# Patient Record
Sex: Female | Born: 1950 | Race: White | Hispanic: No | Marital: Married | Smoking: Never smoker
Health system: Southern US, Community
[De-identification: ages and names within clinical notes are randomized; demographics above are authoritative.]

## PROBLEM LIST (undated history)

## (undated) DIAGNOSIS — M8700 Idiopathic aseptic necrosis of unspecified bone: Secondary | ICD-10-CM

## (undated) DIAGNOSIS — Z860100 Personal history of colon polyps, unspecified: Secondary | ICD-10-CM

## (undated) DIAGNOSIS — M87051 Idiopathic aseptic necrosis of right femur: Secondary | ICD-10-CM

## (undated) DIAGNOSIS — R911 Solitary pulmonary nodule: Secondary | ICD-10-CM

## (undated) DIAGNOSIS — M47816 Spondylosis without myelopathy or radiculopathy, lumbar region: Secondary | ICD-10-CM

## (undated) DIAGNOSIS — N393 Stress incontinence (female) (male): Secondary | ICD-10-CM

## (undated) DIAGNOSIS — J320 Chronic maxillary sinusitis: Secondary | ICD-10-CM

## (undated) DIAGNOSIS — R945 Abnormal results of liver function studies: Secondary | ICD-10-CM

## (undated) DIAGNOSIS — L718 Other rosacea: Secondary | ICD-10-CM

## (undated) DIAGNOSIS — T7840XA Allergy, unspecified, initial encounter: Secondary | ICD-10-CM

## (undated) DIAGNOSIS — J302 Other seasonal allergic rhinitis: Secondary | ICD-10-CM

## (undated) DIAGNOSIS — E559 Vitamin D deficiency, unspecified: Secondary | ICD-10-CM

## (undated) DIAGNOSIS — K219 Gastro-esophageal reflux disease without esophagitis: Secondary | ICD-10-CM

## (undated) DIAGNOSIS — J329 Chronic sinusitis, unspecified: Secondary | ICD-10-CM

## (undated) DIAGNOSIS — R569 Unspecified convulsions: Secondary | ICD-10-CM

## (undated) DIAGNOSIS — E119 Type 2 diabetes mellitus without complications: Secondary | ICD-10-CM

## (undated) DIAGNOSIS — M169 Osteoarthritis of hip, unspecified: Secondary | ICD-10-CM

## (undated) DIAGNOSIS — M797 Fibromyalgia: Secondary | ICD-10-CM

## (undated) DIAGNOSIS — L309 Dermatitis, unspecified: Secondary | ICD-10-CM

## (undated) DIAGNOSIS — Z780 Asymptomatic menopausal state: Secondary | ICD-10-CM

## (undated) DIAGNOSIS — I10 Essential (primary) hypertension: Secondary | ICD-10-CM

## (undated) DIAGNOSIS — M19041 Primary osteoarthritis, right hand: Secondary | ICD-10-CM

## (undated) DIAGNOSIS — H269 Unspecified cataract: Secondary | ICD-10-CM

## (undated) DIAGNOSIS — F419 Anxiety disorder, unspecified: Secondary | ICD-10-CM

## (undated) DIAGNOSIS — K760 Fatty (change of) liver, not elsewhere classified: Secondary | ICD-10-CM

## (undated) DIAGNOSIS — J45909 Unspecified asthma, uncomplicated: Secondary | ICD-10-CM

## (undated) DIAGNOSIS — N8110 Cystocele, unspecified: Secondary | ICD-10-CM

## (undated) DIAGNOSIS — G4733 Obstructive sleep apnea (adult) (pediatric): Secondary | ICD-10-CM

## (undated) DIAGNOSIS — R51 Headache: Secondary | ICD-10-CM

## (undated) DIAGNOSIS — K589 Irritable bowel syndrome without diarrhea: Secondary | ICD-10-CM

## (undated) DIAGNOSIS — J309 Allergic rhinitis, unspecified: Secondary | ICD-10-CM

## (undated) DIAGNOSIS — M869 Osteomyelitis, unspecified: Secondary | ICD-10-CM

## (undated) DIAGNOSIS — G4730 Sleep apnea, unspecified: Secondary | ICD-10-CM

## (undated) DIAGNOSIS — J189 Pneumonia, unspecified organism: Secondary | ICD-10-CM

## (undated) DIAGNOSIS — Z8739 Personal history of other diseases of the musculoskeletal system and connective tissue: Secondary | ICD-10-CM

## (undated) DIAGNOSIS — F4310 Post-traumatic stress disorder, unspecified: Secondary | ICD-10-CM

## (undated) DIAGNOSIS — Z8701 Personal history of pneumonia (recurrent): Secondary | ICD-10-CM

## (undated) DIAGNOSIS — E782 Mixed hyperlipidemia: Secondary | ICD-10-CM

## (undated) DIAGNOSIS — Z8669 Personal history of other diseases of the nervous system and sense organs: Secondary | ICD-10-CM

## (undated) DIAGNOSIS — K449 Diaphragmatic hernia without obstruction or gangrene: Secondary | ICD-10-CM

## (undated) DIAGNOSIS — E538 Deficiency of other specified B group vitamins: Secondary | ICD-10-CM

## (undated) DIAGNOSIS — F3342 Major depressive disorder, recurrent, in full remission: Secondary | ICD-10-CM

## (undated) DIAGNOSIS — G2581 Restless legs syndrome: Secondary | ICD-10-CM

## (undated) DIAGNOSIS — G44029 Chronic cluster headache, not intractable: Secondary | ICD-10-CM

## (undated) DIAGNOSIS — M19042 Primary osteoarthritis, left hand: Secondary | ICD-10-CM

## (undated) HISTORY — DX: Unspecified cataract: H26.9

## (undated) HISTORY — PX: RHINOPLASTY: SUR1284

## (undated) HISTORY — DX: Fibromyalgia: M79.7

## (undated) HISTORY — DX: Irritable bowel syndrome without diarrhea: K58.9

## (undated) HISTORY — PX: OTHER SURGICAL HISTORY: SHX169

## (undated) HISTORY — PX: LAPAROSCOPIC TOTAL HYSTERECTOMY: SUR800

## (undated) HISTORY — PX: INCONTINENCE SURGERY: SHX676

## (undated) HISTORY — DX: Personal history of other diseases of the nervous system and sense organs: Z86.69

## (undated) HISTORY — PX: REVISION TOTAL HIP ARTHROPLASTY: SHX766

## (undated) HISTORY — PX: CARPAL TUNNEL RELEASE: SHX101

## (undated) HISTORY — DX: Essential (primary) hypertension: I10

## (undated) HISTORY — DX: Allergic rhinitis, unspecified: J30.9

## (undated) HISTORY — PX: ULNAR NERVE TRANSPOSITION: SHX2595

## (undated) HISTORY — DX: Primary osteoarthritis, left hand: M19.042

## (undated) HISTORY — PX: BREAST BIOPSY: SHX20

## (undated) HISTORY — DX: Gastro-esophageal reflux disease without esophagitis: K21.9

## (undated) HISTORY — DX: Unspecified asthma, uncomplicated: J45.909

## (undated) HISTORY — DX: Abnormal results of liver function studies: R94.5

## (undated) HISTORY — DX: Primary osteoarthritis, right hand: M19.041

## (undated) HISTORY — PX: NASAL SEPTUM SURGERY: SHX37

## (undated) HISTORY — PX: EYE SURGERY: SHX253

## (undated) HISTORY — DX: Fatty (change of) liver, not elsewhere classified: K76.0

## (undated) HISTORY — DX: Sleep apnea, unspecified: G47.30

## (undated) HISTORY — DX: Restless legs syndrome: G25.81

## (undated) HISTORY — DX: Asymptomatic menopausal state: Z78.0

## (undated) HISTORY — DX: Major depressive disorder, recurrent, in full remission: F33.42

## (undated) HISTORY — PX: JOINT REPLACEMENT: SHX530

## (undated) HISTORY — PX: COLONOSCOPY: SHX174

## (undated) HISTORY — DX: Obstructive sleep apnea (adult) (pediatric): G47.33

## (undated) HISTORY — DX: Allergy, unspecified, initial encounter: T78.40XA

---

## 1977-10-08 HISTORY — PX: ECTOPIC PREGNANCY SURGERY: SHX613

## 1982-10-08 HISTORY — PX: TOTAL ABDOMINAL HYSTERECTOMY W/ BILATERAL SALPINGOOPHORECTOMY: SHX83

## 1982-10-08 HISTORY — PX: BLADDER SURGERY: SHX569

## 1985-10-08 HISTORY — PX: NASAL SEPTUM SURGERY: SHX37

## 1987-10-09 HISTORY — PX: CARPAL TUNNEL RELEASE: SHX101

## 2003-10-09 DIAGNOSIS — Z8639 Personal history of other endocrine, nutritional and metabolic disease: Secondary | ICD-10-CM

## 2003-10-09 HISTORY — DX: Personal history of other endocrine, nutritional and metabolic disease: Z86.39

## 2004-10-08 DIAGNOSIS — R569 Unspecified convulsions: Secondary | ICD-10-CM

## 2004-10-08 DIAGNOSIS — Z87898 Personal history of other specified conditions: Secondary | ICD-10-CM

## 2004-10-08 HISTORY — PX: ANAL FISSURE REPAIR: SHX2312

## 2004-10-08 HISTORY — DX: Personal history of other specified conditions: Z87.898

## 2004-10-08 HISTORY — PX: ABDOMINOPLASTY: SUR9

## 2004-10-08 HISTORY — DX: Unspecified convulsions: R56.9

## 2005-10-08 HISTORY — PX: RHINOPLASTY: SUR1284

## 2013-05-11 ENCOUNTER — Other Ambulatory Visit (HOSPITAL_COMMUNITY): Payer: Self-pay | Admitting: Family Medicine

## 2013-05-11 DIAGNOSIS — R109 Unspecified abdominal pain: Secondary | ICD-10-CM

## 2013-05-13 ENCOUNTER — Ambulatory Visit (HOSPITAL_COMMUNITY)
Admission: RE | Admit: 2013-05-13 | Discharge: 2013-05-13 | Disposition: A | Discharge status: Home / Self Care | Source: Ambulatory Visit | Attending: Family Medicine | Admitting: Family Medicine

## 2013-05-13 DIAGNOSIS — R109 Unspecified abdominal pain: Secondary | ICD-10-CM

## 2013-05-13 DIAGNOSIS — R112 Nausea with vomiting, unspecified: Secondary | ICD-10-CM | POA: Insufficient documentation

## 2013-05-13 DIAGNOSIS — K7689 Other specified diseases of liver: Secondary | ICD-10-CM | POA: Insufficient documentation

## 2014-04-06 ENCOUNTER — Other Ambulatory Visit (HOSPITAL_COMMUNITY): Payer: Self-pay | Admitting: Family Medicine

## 2014-04-06 DIAGNOSIS — Z1231 Encounter for screening mammogram for malignant neoplasm of breast: Secondary | ICD-10-CM

## 2014-04-13 ENCOUNTER — Ambulatory Visit (HOSPITAL_COMMUNITY)
Admission: RE | Admit: 2014-04-13 | Discharge: 2014-04-13 | Disposition: A | Discharge status: Home / Self Care | Source: Ambulatory Visit | Attending: Family Medicine | Admitting: Family Medicine

## 2014-04-13 DIAGNOSIS — Z1231 Encounter for screening mammogram for malignant neoplasm of breast: Secondary | ICD-10-CM | POA: Insufficient documentation

## 2014-04-16 ENCOUNTER — Ambulatory Visit (HOSPITAL_COMMUNITY)

## 2015-07-14 ENCOUNTER — Other Ambulatory Visit: Payer: Self-pay

## 2015-07-14 ENCOUNTER — Other Ambulatory Visit: Payer: Self-pay | Admitting: Family Medicine

## 2015-07-14 DIAGNOSIS — Z1231 Encounter for screening mammogram for malignant neoplasm of breast: Secondary | ICD-10-CM

## 2015-08-15 ENCOUNTER — Ambulatory Visit
Admission: RE | Admit: 2015-08-15 | Discharge: 2015-08-15 | Disposition: A | Discharge status: Home / Self Care | Source: Ambulatory Visit

## 2015-08-15 DIAGNOSIS — Z1231 Encounter for screening mammogram for malignant neoplasm of breast: Secondary | ICD-10-CM

## 2015-08-22 ENCOUNTER — Ambulatory Visit

## 2015-10-09 HISTORY — PX: COLONOSCOPY W/ POLYPECTOMY: SHX1380

## 2015-10-09 HISTORY — PX: CATARACT EXTRACTION W/ INTRAOCULAR LENS  IMPLANT, BILATERAL: SHX1307

## 2015-10-20 LAB — HM COLONOSCOPY

## 2016-10-29 ENCOUNTER — Other Ambulatory Visit: Payer: Self-pay | Admitting: Family Medicine

## 2016-10-29 DIAGNOSIS — Z1231 Encounter for screening mammogram for malignant neoplasm of breast: Secondary | ICD-10-CM

## 2016-11-26 ENCOUNTER — Ambulatory Visit
Admission: RE | Admit: 2016-11-26 | Discharge: 2016-11-26 | Disposition: A | Discharge status: Home / Self Care | Source: Ambulatory Visit | Attending: Family Medicine | Admitting: Family Medicine

## 2016-11-26 ENCOUNTER — Other Ambulatory Visit: Payer: Self-pay | Admitting: Family Medicine

## 2016-11-26 DIAGNOSIS — Z1231 Encounter for screening mammogram for malignant neoplasm of breast: Secondary | ICD-10-CM

## 2017-06-26 DIAGNOSIS — F339 Major depressive disorder, recurrent, unspecified: Secondary | ICD-10-CM | POA: Insufficient documentation

## 2017-06-26 DIAGNOSIS — I10 Essential (primary) hypertension: Secondary | ICD-10-CM | POA: Insufficient documentation

## 2017-06-26 DIAGNOSIS — F3342 Major depressive disorder, recurrent, in full remission: Secondary | ICD-10-CM | POA: Insufficient documentation

## 2017-06-26 HISTORY — DX: Essential (primary) hypertension: I10

## 2017-06-26 HISTORY — DX: Major depressive disorder, recurrent, in full remission: F33.42

## 2017-06-26 LAB — BASIC METABOLIC PANEL
BUN: 24 — AB (ref 4–21)
CREATININE: 1 (ref 0.5–1.1)
GLUCOSE: 112
Potassium: 3.1 — AB (ref 3.4–5.3)
Sodium: 139 (ref 137–147)

## 2017-06-26 LAB — VITAMIN D 25 HYDROXY (VIT D DEFICIENCY, FRACTURES): Vit D, 25-Hydroxy: 38

## 2017-06-26 LAB — HEPATIC FUNCTION PANEL
ALK PHOS: 58 (ref 25–125)
ALT: 106 — AB (ref 7–35)
AST: 90 — AB (ref 13–35)
BILIRUBIN, TOTAL: 0.5

## 2017-06-26 LAB — CBC AND DIFFERENTIAL
HCT: 44 (ref 36–46)
Hemoglobin: 14.3 (ref 12.0–16.0)
PLATELETS: 277 (ref 150–399)
WBC: 7

## 2017-06-26 LAB — LIPID PANEL
Cholesterol: 227 — AB (ref 0–200)
HDL: 68 (ref 35–70)
Triglycerides: 156 (ref 40–160)

## 2017-06-26 LAB — TSH: TSH: 2.62 (ref 0.41–5.90)

## 2017-07-10 DIAGNOSIS — M19042 Primary osteoarthritis, left hand: Secondary | ICD-10-CM | POA: Insufficient documentation

## 2017-07-10 DIAGNOSIS — M19041 Primary osteoarthritis, right hand: Secondary | ICD-10-CM | POA: Insufficient documentation

## 2017-07-10 HISTORY — DX: Primary osteoarthritis, right hand: M19.042

## 2017-07-10 HISTORY — DX: Primary osteoarthritis, right hand: M19.041

## 2017-08-09 DIAGNOSIS — J0110 Acute frontal sinusitis, unspecified: Secondary | ICD-10-CM | POA: Diagnosis not present

## 2017-09-06 DIAGNOSIS — J0100 Acute maxillary sinusitis, unspecified: Secondary | ICD-10-CM | POA: Diagnosis not present

## 2017-09-16 ENCOUNTER — Ambulatory Visit: Admitting: Family Medicine

## 2017-10-10 ENCOUNTER — Encounter: Payer: Self-pay | Admitting: Physical Therapy

## 2017-10-11 DIAGNOSIS — R32 Unspecified urinary incontinence: Secondary | ICD-10-CM | POA: Diagnosis not present

## 2017-10-11 DIAGNOSIS — R339 Retention of urine, unspecified: Secondary | ICD-10-CM | POA: Diagnosis not present

## 2017-10-14 ENCOUNTER — Encounter: Payer: Self-pay | Admitting: Family Medicine

## 2017-10-14 ENCOUNTER — Ambulatory Visit (INDEPENDENT_AMBULATORY_CARE_PROVIDER_SITE_OTHER): Payer: Medicare Other | Admitting: Family Medicine

## 2017-10-14 VITALS — BP 118/68 | HR 61 | Temp 98.0°F | Ht 63.5 in | Wt 183.0 lb

## 2017-10-14 DIAGNOSIS — I10 Essential (primary) hypertension: Secondary | ICD-10-CM

## 2017-10-14 DIAGNOSIS — R102 Pelvic and perineal pain: Secondary | ICD-10-CM | POA: Insufficient documentation

## 2017-10-14 DIAGNOSIS — G4733 Obstructive sleep apnea (adult) (pediatric): Secondary | ICD-10-CM

## 2017-10-14 DIAGNOSIS — K760 Fatty (change of) liver, not elsewhere classified: Secondary | ICD-10-CM

## 2017-10-14 DIAGNOSIS — N8110 Cystocele, unspecified: Secondary | ICD-10-CM

## 2017-10-14 DIAGNOSIS — E559 Vitamin D deficiency, unspecified: Secondary | ICD-10-CM | POA: Diagnosis not present

## 2017-10-14 DIAGNOSIS — J320 Chronic maxillary sinusitis: Secondary | ICD-10-CM

## 2017-10-14 DIAGNOSIS — K219 Gastro-esophageal reflux disease without esophagitis: Secondary | ICD-10-CM

## 2017-10-14 DIAGNOSIS — K589 Irritable bowel syndrome without diarrhea: Secondary | ICD-10-CM | POA: Insufficient documentation

## 2017-10-14 DIAGNOSIS — M797 Fibromyalgia: Secondary | ICD-10-CM

## 2017-10-14 DIAGNOSIS — G2581 Restless legs syndrome: Secondary | ICD-10-CM | POA: Diagnosis not present

## 2017-10-14 DIAGNOSIS — R945 Abnormal results of liver function studies: Secondary | ICD-10-CM

## 2017-10-14 DIAGNOSIS — F3342 Major depressive disorder, recurrent, in full remission: Secondary | ICD-10-CM

## 2017-10-14 DIAGNOSIS — E538 Deficiency of other specified B group vitamins: Secondary | ICD-10-CM | POA: Diagnosis not present

## 2017-10-14 DIAGNOSIS — J301 Allergic rhinitis due to pollen: Secondary | ICD-10-CM

## 2017-10-14 DIAGNOSIS — Z8669 Personal history of other diseases of the nervous system and sense organs: Secondary | ICD-10-CM | POA: Diagnosis not present

## 2017-10-14 DIAGNOSIS — Z780 Asymptomatic menopausal state: Secondary | ICD-10-CM

## 2017-10-14 DIAGNOSIS — M545 Low back pain, unspecified: Secondary | ICD-10-CM

## 2017-10-14 DIAGNOSIS — J309 Allergic rhinitis, unspecified: Secondary | ICD-10-CM | POA: Insufficient documentation

## 2017-10-14 DIAGNOSIS — K7581 Nonalcoholic steatohepatitis (NASH): Secondary | ICD-10-CM | POA: Insufficient documentation

## 2017-10-14 DIAGNOSIS — E782 Mixed hyperlipidemia: Secondary | ICD-10-CM | POA: Diagnosis not present

## 2017-10-14 DIAGNOSIS — E1169 Type 2 diabetes mellitus with other specified complication: Secondary | ICD-10-CM | POA: Insufficient documentation

## 2017-10-14 DIAGNOSIS — E669 Obesity, unspecified: Secondary | ICD-10-CM

## 2017-10-14 DIAGNOSIS — R7989 Other specified abnormal findings of blood chemistry: Secondary | ICD-10-CM | POA: Insufficient documentation

## 2017-10-14 HISTORY — DX: Fibromyalgia: M79.7

## 2017-10-14 HISTORY — DX: Gastro-esophageal reflux disease without esophagitis: K21.9

## 2017-10-14 HISTORY — DX: Restless legs syndrome: G25.81

## 2017-10-14 HISTORY — DX: Fatty (change of) liver, not elsewhere classified: K76.0

## 2017-10-14 HISTORY — DX: Allergic rhinitis, unspecified: J30.9

## 2017-10-14 HISTORY — DX: Irritable bowel syndrome, unspecified: K58.9

## 2017-10-14 HISTORY — DX: Obstructive sleep apnea (adult) (pediatric): G47.33

## 2017-10-14 LAB — CBC WITH DIFFERENTIAL/PLATELET
Basophils Absolute: 0 10*3/uL (ref 0.0–0.1)
Basophils Relative: 0.4 % (ref 0.0–3.0)
Eosinophils Absolute: 0.1 10*3/uL (ref 0.0–0.7)
Eosinophils Relative: 1 % (ref 0.0–5.0)
HCT: 45.6 % (ref 36.0–46.0)
Hemoglobin: 14.8 g/dL (ref 12.0–15.0)
Lymphocytes Relative: 25.3 % (ref 12.0–46.0)
Lymphs Abs: 2 10*3/uL (ref 0.7–4.0)
MCHC: 32.5 g/dL (ref 30.0–36.0)
MCV: 94 fl (ref 78.0–100.0)
Monocytes Absolute: 0.6 10*3/uL (ref 0.1–1.0)
Monocytes Relative: 8.1 % (ref 3.0–12.0)
Neutro Abs: 5.2 10*3/uL (ref 1.4–7.7)
Neutrophils Relative %: 65.2 % (ref 43.0–77.0)
Platelets: 315 10*3/uL (ref 150.0–400.0)
RBC: 4.86 Mil/uL (ref 3.87–5.11)
RDW: 14.6 % (ref 11.5–15.5)
WBC: 8 10*3/uL (ref 4.0–10.5)

## 2017-10-14 LAB — COMPREHENSIVE METABOLIC PANEL
ALT: 43 U/L — ABNORMAL HIGH (ref 0–35)
AST: 22 U/L (ref 0–37)
Albumin: 4.4 g/dL (ref 3.5–5.2)
Alkaline Phosphatase: 68 U/L (ref 39–117)
BUN: 20 mg/dL (ref 6–23)
CO2: 33 mEq/L — ABNORMAL HIGH (ref 19–32)
Calcium: 9.8 mg/dL (ref 8.4–10.5)
Chloride: 98 mEq/L (ref 96–112)
Creatinine, Ser: 0.96 mg/dL (ref 0.40–1.20)
GFR: 61.97 mL/min (ref >60.00)
Glucose, Bld: 109 mg/dL — ABNORMAL HIGH (ref 70–99)
Potassium: 4.5 mEq/L (ref 3.5–5.1)
Sodium: 140 mEq/L (ref 135–145)
Total Bilirubin: 0.7 mg/dL (ref 0.2–1.2)
Total Protein: 7.1 g/dL (ref 6.0–8.3)

## 2017-10-14 LAB — LIPID PANEL
Cholesterol: 246 mg/dL — ABNORMAL HIGH (ref 0–200)
HDL: 68.1 mg/dL (ref >39.00)
LDL Cholesterol: 155 mg/dL — ABNORMAL HIGH (ref 0–99)
NonHDL: 177.77
Total CHOL/HDL Ratio: 4
Triglycerides: 113 mg/dL (ref 0.0–149.0)
VLDL: 22.6 mg/dL (ref 0.0–40.0)

## 2017-10-14 LAB — VITAMIN D 25 HYDROXY (VIT D DEFICIENCY, FRACTURES): VITD: 31.44 ng/mL (ref 30.00–100.00)

## 2017-10-14 LAB — VITAMIN B12: Vitamin B-12: 357 pg/mL (ref 211–911)

## 2017-10-14 MED ORDER — CEFDINIR 300 MG PO CAPS: 300.0000 mg | ORAL_CAPSULE | Freq: Two times a day (BID) | ORAL | 0 refills | Status: DC

## 2017-10-14 MED ORDER — PREDNISONE 5 MG PO TABS: 5.0000 mg | ORAL_TABLET | Freq: Every day | ORAL | 0 refills | Status: DC

## 2017-10-14 MED ORDER — GABAPENTIN 100 MG PO CAPS: 100.0000 mg | ORAL_CAPSULE | Freq: Every day | ORAL | 3 refills | Status: DC

## 2017-10-14 NOTE — Patient Instructions (Signed)
It was very nice to meet you today!  We are obtaining labs.  I will be looking into your records and will likely order an MRI of your lumbar spine.  I would like to see you in 1 month or sooner if you need me.

## 2017-10-14 NOTE — Progress Notes (Addendum)
Margaret White is a 67 y.o. female is here to [facility].   Patient Care Team: Briscoe Deutscher, DO as PCP - General (Family Medicine)   History of Present Illness:   HPI: See Assessment and Plan section for Problem Based Charting of issues discussed today.  Patient comes in today to establish care with Dr. Juleen China. She is having some sinus problems today.   Health Maintenance Due  Topic Date Due  . HIV Screening  09/06/1967  . TETANUS/TDAP  09/06/1971  . COLONOSCOPY  09/05/2002  . DEXA SCAN  09/05/2017  . PNA vac Low Risk Adult (1 of 2 - PCV13) 09/05/2017   Depression screen PHQ 2/9 10/14/2017  Decreased Interest 0  Down, Depressed, Hopeless 0  PHQ - 2 Score 0  Altered sleeping 1  Tired, decreased energy 3  Change in appetite 0  Feeling bad or failure about yourself  0  Trouble concentrating 0  Moving slowly or fidgety/restless 0  Suicidal thoughts 0  PHQ-9 Score 4   PMHx, SurgHx, SocialHx, Medications, and Allergies were reviewed in the Visit Navigator and updated as appropriate.   Past Medical History:  Diagnosis Date  . Abnormal LFTs 10/14/2017  . Allergic rhinitis 10/14/2017  . Essential (primary) hypertension 06/26/2017  . Fibromyalgia 10/14/2017  . GERD (gastroesophageal reflux disease) 10/14/2017  . Hepatic steatosis 10/14/2017  . History of cluster headache 10/14/2017  . IBS (irritable bowel syndrome) 10/14/2017  . OSA (obstructive sleep apnea) 10/14/2017  . Postmenopausal 10/14/2017  . Primary osteoarthritis of both hands 07/10/2017  . Recurrent major depressive disorder, in full remission ([place]) 06/26/2017  . RLS (restless legs syndrome) 10/14/2017    Past Surgical History:  Procedure Laterality Date  . BREAST BIOPSY Left    approximately 10 years ago/ benign    . LAPAROSCOPIC TOTAL HYSTERECTOMY    . RHINOPLASTY      Family History  Problem Relation Age of Onset  . Hypertension Mother   . Cancer Father   . Heart disease Father    Social History    Tobacco Use  . Smoking status: Never Smoker  . Smokeless tobacco: Never Used  Substance Use Topics  . Alcohol use: No    Frequency: Never  . Drug use: No   Current Medications and Allergies:   .  FLUoxetine (PROZAC) 20 MG tablet, Take 20 mg by mouth daily., Disp: , Rfl:  .  fluticasone (FLONASE) 50 MCG/ACT nasal spray, Place into both nostrils daily., Disp: , Rfl:  .  hydrochlorothiazide (HYDRODIURIL) 25 MG tablet, Take 25 mg by mouth daily., Disp: , Rfl:  .  loperamide (IMODIUM) 2 MG capsule, Take by mouth as needed for diarrhea or loose stools., Disp: , Rfl:  .  montelukast (SINGULAIR) 10 MG tablet, Take 10 mg by mouth at bedtime., Disp: , Rfl:  .  omeprazole (PRILOSEC) 20 MG capsule, Take 20 mg by mouth daily., Disp: , Rfl:  .  potassium gluconate 595 (99 K) MG TABS tablet, Take 595 mg by mouth., Disp: , Rfl:    Allergies  Allergen Reactions  . Codeine Itching  . Sulfa Antibiotics Rash    Flu like symptoms   Review of Systems:   Pertinent items are noted in the HPI. Otherwise, ROS is negative.  Vitals:   Vitals:   10/14/17 0759  BP: 118/68  Pulse: 61  Temp: 98 F (36.7 C)  TempSrc: Oral  SpO2: 96%  Weight: 183 lb (83 kg)  Height: 5' 3.5" (1.613 m)  Body mass index is 31.91 kg/m.  Physical Exam:   Physical Exam  Constitutional: She is oriented to person, place, and time. She appears well-developed and well-nourished. No distress.  HENT:  Head: Normocephalic and atraumatic.  Right Ear: External ear normal.  Left Ear: External ear normal.  Nose: Nose normal.  Mouth/Throat: Oropharynx is clear and moist.  Eyes: Conjunctivae and EOM are normal. Pupils are equal, round, and reactive to light.  Neck: Normal range of motion. Neck supple. No thyromegaly present.  Cardiovascular: Normal rate, regular rhythm, normal heart sounds and intact distal pulses.  Pulmonary/Chest: Effort normal and breath sounds normal.  Abdominal: Soft. Bowel sounds are normal.   Musculoskeletal: Normal range of motion.  Lymphadenopathy:    She has no cervical adenopathy.  Neurological: She is alert and oriented to person, place, and time.  Skin: Skin is warm and dry. Capillary refill takes less than 2 seconds.  Psychiatric: She has a normal mood and affect. Her behavior is normal.  Nursing note and vitals reviewed.   Results for orders placed or performed in visit on 10/14/17  CBC with Differential/Platelet  Result Value Ref Range   WBC 8.0 4.0 - 10.5 K/uL   RBC 4.86 3.87 - 5.11 Mil/uL   Hemoglobin 14.8 12.0 - 15.0 g/dL   HCT 45.6 36.0 - 46.0 %   MCV 94.0 78.0 - 100.0 fl   MCHC 32.5 30.0 - 36.0 g/dL   RDW 14.6 11.5 - 15.5 %   Platelets 315.0 150.0 - 400.0 K/uL   Neutrophils Relative % 65.2 43.0 - 77.0 %   Lymphocytes Relative 25.3 12.0 - 46.0 %   Monocytes Relative 8.1 3.0 - 12.0 %   Eosinophils Relative 1.0 0.0 - 5.0 %   Basophils Relative 0.4 0.0 - 3.0 %   Neutro Abs 5.2 1.4 - 7.7 K/uL   Lymphs Abs 2.0 0.7 - 4.0 K/uL   Monocytes Absolute 0.6 0.1 - 1.0 K/uL   Eosinophils Absolute 0.1 0.0 - 0.7 K/uL   Basophils Absolute 0.0 0.0 - 0.1 K/uL  Comprehensive metabolic panel  Result Value Ref Range   Sodium 140 135 - 145 mEq/L   Potassium 4.5 3.5 - 5.1 mEq/L   Chloride 98 96 - 112 mEq/L   CO2 33 (H) 19 - 32 mEq/L   Glucose, Bld 109 (H) 70 - 99 mg/dL   BUN 20 6 - 23 mg/dL   Creatinine, Ser 0.96 0.40 - 1.20 mg/dL   Total Bilirubin 0.7 0.2 - 1.2 mg/dL   Alkaline Phosphatase 68 39 - 117 U/L   AST 22 0 - 37 U/L   ALT 43 (H) 0 - 35 U/L   Total Protein 7.1 6.0 - 8.3 g/dL   Albumin 4.4 3.5 - 5.2 g/dL   Calcium 9.8 8.4 - 10.5 mg/dL   GFR 61.97 >60.00 mL/min  VITAMIN D 25 Hydroxy (Vit-D Deficiency, Fractures)  Result Value Ref Range   VITD 31.44 30.00 - 100.00 ng/mL  Vitamin B12  Result Value Ref Range   Vitamin B-12 357 211 - 911 pg/mL  Lipid panel  Result Value Ref Range   Cholesterol 246 (H) 0 - 200 mg/dL   Triglycerides 113.0 0.0 - 149.0 mg/dL    HDL 68.10 >39.00 mg/dL   VLDL 22.6 0.0 - 40.0 mg/dL   LDL Cholesterol 155 (H) 0 - 99 mg/dL   Total CHOL/HDL Ratio 4    NonHDL 177.77    Assessment and Plan:   1. Pelvic pain NEW. Pain radiates  to her groin. Worse with walking. Sometimes radiating from back. No trauma. Some pain down bilateral sciatic. No incontinence. No falls. Will order MRI lumbar to evaluate further.   - gabapentin (NEURONTIN) 100 MG capsule; Take 1 capsule (100 mg total) by mouth at bedtime.  Dispense: 30 capsule; Refill: 3  2. Chronic maxillary sinusitis NEW. The patient complains of typical sinusitis symptoms; coryza, nasal stuffiness, sinus pain and pressure, congestion, postnasal drip, sore throat, malaise. Sinusitis to be treated as below.   - montelukast (SINGULAIR) 10 MG tablet; Take 10 mg by mouth at bedtime. - fluticasone (FLONASE) 50 MCG/ACT nasal spray; Place into both nostrils daily. - predniSONE (DELTASONE) 5 MG tablet; Take 1 tablet (5 mg total) by mouth daily with breakfast. 6-6-5-5-4-4-3-3-2-2-1-1-off  Dispense: 42 tablet; Refill: 0 - cefdinir (OMNICEF) 300 MG capsule; Take 1 capsule (300 mg total) by mouth 2 (two) times daily.  Dispense: 20 capsule; Refill: 0 - fexofenadine (ALLEGRA ALLERGY) 180 MG tablet; Take 1 tablet by mouth daily.  3. Gastroesophageal reflux disease without esophagitis Well controlled.  No signs of complications, medication side effects, or red flags.  Continue current regimen.    - omeprazole (PRILOSEC) 20 MG capsule; Take 20 mg by mouth daily.  4. Postmenopausal No concerns today.  5. Allergic rhinitis associated with above chronic sinusitis See above for chronic treatment.   6. Abnormal LFTs Known fatty liver. Trend is improving.   Lab Results  Component Value Date   ALT 43 (H) 10/14/2017   AST 22 10/14/2017   ALKPHOS 68 10/14/2017   BILITOT 0.7 10/14/2017   - Comprehensive metabolic panel  7. Hepatic steatosis Last [place] with advanced hepatic  steatosis.  8. OSA (obstructive sleep apnea) CPAP compliant.  9. RLS (restless legs syndrome) No signs of complications, medication side effects, or red flags.  Continue current regimen.    - CBC with Differential/Platelet  10. History of cluster headache None now.  11. Irritable bowel syndrome, unspecified type Well controlled.  No signs of complications, medication side effects, or red flags.  Continue current regimen.    - loperamide (IMODIUM) 2 MG capsule; Take by mouth as needed for diarrhea or loose stools.  12. Fibromyalgia With intermittent flares. No changes today.  - gabapentin (NEURONTIN) 100 MG capsule; Take 1 capsule (100 mg total) by mouth at bedtime.  Dispense: 30 capsule; Refill: 3  13. Vitamin D deficiency Well controlled.  No signs of complications, medication side effects, or red flags.  Continue current regimen.    - VITAMIN D 25 Hydroxy (Vit-D Deficiency, Fractures)  14. Vitamin B12 deficiency Well controlled.  No signs of complications, medication side effects, or red flags.  Continue current regimen.    - Vitamin B12  15. Mixed hyperlipidemia  Lab Results  Component Value Date   CHOL 246 (H) 10/14/2017   HDL 68.10 10/14/2017   LDLCALC 155 (H) 10/14/2017   TRIG 113.0 10/14/2017   CHOLHDL 4 10/14/2017   The 10-year ASCVD risk score Mikey Bussing DC Jr., et al., 2013) is: 6.5%   Values used to calculate the score:     Age: 23 years     Sex: Female     Is Non-Hispanic African American: No     Diabetic: No     Tobacco smoker: No     Systolic Blood Pressure: 193 mmHg     Is BP treated: Yes     HDL Cholesterol: 68.1 mg/dL     Total Cholesterol: 246 mg/dL  Will offer a  statin, but will okay hold if she cannot tolerate them due to myalgias.  - Lipid panel  16. Obesity (BMI 30-39.9) The patient is asked to make an attempt to improve diet and exercise patterns to aid in medical management of this problem.   62. Essential hypertension Well controlled.   No signs of complications, medication side effects, or red flags.  Continue current regimen.    - hydrochlorothiazide (HYDRODIURIL) 25 MG tablet; Take 25 mg by mouth daily. - potassium gluconate 595 (99 K) MG TABS tablet; Take 595 mg by mouth.  18. Recurrent major depressive disorder, in full remission ([place]) Well controlled.  No signs of complications, medication side effects, or red flags.  Continue current regimen.    - FLUoxetine (PROZAC) 20 MG tablet; Take 20 mg by mouth daily.   . Reviewed expectations re: course of current medical issues. . Discussed self-management of symptoms. . Outlined signs and symptoms indicating need for more acute intervention. . Patient verbalized understanding and all questions were answered. Marland Kitchen Health Maintenance issues including appropriate healthy diet, exercise, and smoking avoidance were discussed with patient. . See orders for this visit as documented in the electronic medical record. . Patient received an After Visit Summary.   Briscoe Deutscher, DO [place] 10/14/2017  Records requested if needed. Time spent with the patient: 45 minutes, of which >50% was spent in obtaining information about her symptoms, reviewing her previous labs, evaluations, and treatments, counseling her about her condition (please see the discussed topics above), and developing a plan to further investigate it; she had a number of questions which I addressed.

## 2017-10-21 ENCOUNTER — Telehealth: Payer: Self-pay | Admitting: Family Medicine

## 2017-10-21 ENCOUNTER — Encounter: Payer: Self-pay | Admitting: Surgical

## 2017-10-21 NOTE — Telephone Encounter (Signed)
Left message for patient to return call. Per Epic Mammo not due until 2020. Per Dr. Juleen China she is fine with patient having Mammo ever two years if no family history of breast cancer.    Please advise on MRI order.

## 2017-10-21 NOTE — Addendum Note (Signed)
Addended by: Briscoe Deutscher R on: 10/21/2017 04:37 PM   Modules accepted: Orders

## 2017-10-21 NOTE — Telephone Encounter (Signed)
Copied from [place] [phone] [id number]. Topic: Quick Communication - See Telephone Encounter >> Oct 21, 2017  9:43 AM Oneta Rack wrote: CRM for notification. See Telephone encounter for:   10/21/17.  Relation to pt: self  Call back number: [phone]   Reason for call:   patient last seen 10/14/17 and inquiring about MRI of back orders due to pain, patient states PCP is aware, please advise  Patient states Mychart alert reflecting patient is due for mamo and colonscopy, requesting orders placed all in the same area, patient would like mamo orders placed with [facility], please advise

## 2017-10-21 NOTE — Telephone Encounter (Signed)
MRI added today.

## 2017-10-21 NOTE — Telephone Encounter (Signed)
I have gotten the colonoscopy report and put in Epic. She just had 10/2015. I am going to place order for Dexa scan. She stated that the imaging place usually sends a letter when she is due. She will schedule that then.

## 2017-10-22 ENCOUNTER — Other Ambulatory Visit: Payer: Self-pay | Admitting: Surgical

## 2017-10-22 DIAGNOSIS — N958 Other specified menopausal and perimenopausal disorders: Secondary | ICD-10-CM

## 2017-10-22 DIAGNOSIS — M8580 Other specified disorders of bone density and structure, unspecified site: Secondary | ICD-10-CM

## 2017-10-22 NOTE — Telephone Encounter (Signed)
Bone density scan has been ordered.

## 2017-10-22 NOTE — Telephone Encounter (Signed)
Please call patient to get them scheduled for a bone density appointment.

## 2017-10-22 NOTE — Telephone Encounter (Signed)
Please call patient and schedule for Bone Density scan.

## 2017-10-23 NOTE — Telephone Encounter (Signed)
I called the patient and left a message with this information.

## 2017-10-24 ENCOUNTER — Telehealth: Payer: Self-pay | Admitting: Family Medicine

## 2017-10-24 NOTE — Telephone Encounter (Signed)
Please call patient regarding MRI.   Copied from [place] [id number]. Topic: Referral - Status >> Oct 24, 2017  2:55 PM Ivar Drape wrote: Reason for CRM: Patient would like the status of her MRI of lower back referral

## 2017-10-25 ENCOUNTER — Ambulatory Visit (INDEPENDENT_AMBULATORY_CARE_PROVIDER_SITE_OTHER)
Admission: RE | Admit: 2017-10-25 | Discharge: 2017-10-25 | Disposition: A | Discharge status: Home / Self Care | Payer: Medicare Other | Source: Ambulatory Visit | Attending: Family Medicine | Admitting: Family Medicine

## 2017-10-25 DIAGNOSIS — N958 Other specified menopausal and perimenopausal disorders: Secondary | ICD-10-CM | POA: Diagnosis not present

## 2017-10-25 NOTE — Telephone Encounter (Signed)
Lanny Hurst can you please check on this.

## 2017-10-25 NOTE — Telephone Encounter (Signed)
Patient will be contacted by [place], they should be calling today or tomorrow. No further action needed on our end.

## 2017-10-31 ENCOUNTER — Telehealth: Payer: Self-pay | Admitting: Family Medicine

## 2017-10-31 NOTE — Telephone Encounter (Signed)
Copied from [place]. Topic: General - Other >> Oct 31, 2017 10:11 AM Lolita Rieger, RMA wrote: Reason for CRM: pt would like to know if there is something that she can take or that you can prescribe for her lower back pain and pain in her leg she stated that she has a MRI scheduled for tomorrow Please contact pt

## 2017-10-31 NOTE — Telephone Encounter (Signed)
Please advise 

## 2017-10-31 NOTE — Telephone Encounter (Signed)
Please call patient. Any improvement with Neurontin. Asking for pain medication?

## 2017-10-31 NOTE — Telephone Encounter (Signed)
Please contact patient to advise °

## 2017-11-01 ENCOUNTER — Ambulatory Visit
Admission: RE | Admit: 2017-11-01 | Discharge: 2017-11-01 | Disposition: A | Discharge status: Home / Self Care | Payer: Medicare Other | Source: Ambulatory Visit | Attending: Family Medicine | Admitting: Family Medicine

## 2017-11-01 DIAGNOSIS — M545 Low back pain, unspecified: Secondary | ICD-10-CM

## 2017-11-01 DIAGNOSIS — R102 Pelvic and perineal pain: Secondary | ICD-10-CM

## 2017-11-01 DIAGNOSIS — M5126 Other intervertebral disc displacement, lumbar region: Secondary | ICD-10-CM | POA: Diagnosis not present

## 2017-11-01 MED ORDER — TRAMADOL HCL 50 MG PO TABS: 50.0000 mg | ORAL_TABLET | Freq: Two times a day (BID) | ORAL | 0 refills | Status: DC | PRN

## 2017-11-01 NOTE — Telephone Encounter (Signed)
Notified patient of message.  Patient verbalized understanding.  

## 2017-11-01 NOTE — Telephone Encounter (Signed)
I called in tramadol for pain.  Make sure the patient understands that this is an opioid and could also cause itch.

## 2017-11-01 NOTE — Telephone Encounter (Signed)
Spoke with patient and she stated that the Neurontin has not made a difference. She stated that yesterday that she started breaking out in a rash so she stopped taking. Patient said that her pain is worse when she is laying down. She wanted to know if there is anything that she can take for the pain? She has MRI today.

## 2017-11-04 ENCOUNTER — Telehealth: Payer: Self-pay

## 2017-11-04 ENCOUNTER — Ambulatory Visit (INDEPENDENT_AMBULATORY_CARE_PROVIDER_SITE_OTHER): Payer: Medicare Other | Admitting: Sports Medicine

## 2017-11-04 ENCOUNTER — Encounter: Payer: Self-pay | Admitting: Sports Medicine

## 2017-11-04 VITALS — BP 122/90 | HR 81 | Ht 63.5 in | Wt 182.2 lb

## 2017-11-04 DIAGNOSIS — M160 Bilateral primary osteoarthritis of hip: Secondary | ICD-10-CM

## 2017-11-04 DIAGNOSIS — R102 Pelvic and perineal pain: Secondary | ICD-10-CM

## 2017-11-04 DIAGNOSIS — M797 Fibromyalgia: Secondary | ICD-10-CM | POA: Diagnosis not present

## 2017-11-04 DIAGNOSIS — M869 Osteomyelitis, unspecified: Secondary | ICD-10-CM | POA: Diagnosis not present

## 2017-11-04 DIAGNOSIS — M47816 Spondylosis without myelopathy or radiculopathy, lumbar region: Secondary | ICD-10-CM

## 2017-11-04 HISTORY — DX: Spondylosis without myelopathy or radiculopathy, lumbar region: M47.816

## 2017-11-04 NOTE — Progress Notes (Signed)
Juanda Bond. Lamaya Hyneman, [place] [facility] [place]  Florinda Taflinger - 67 y.o. female MRN [id number]  Date of birth: July 21, 1951  Visit Date: 11/04/2017  PCP: Briscoe Deutscher, DO   Referred by: Briscoe Deutscher, DO   Scribe for today's visit: Wendy Poet, LAT, ATC     SUBJECTIVE:  Eric Morganti is here for New Patient (Initial Visit) (Pelvic/groin pain) .  Referred by: Briscoe Deutscher, DO Her R pelvic/groin pain symptoms INITIALLY: Began late September 2018 - insidious onset Described as 4-5/10 at rest and 7-8/10 w/ weight bearing describes it as pinching, aching, sharp and burning at times, radiating to R LE particularly the R knee and proximal tibia. Worsened with walking, hip flexion Improved with rest/taking weight off her R leg Additional associated symptoms include: some intermittent N/T in her R foot.  Reports trouble w/ her bladder over the past year (hx of prolapsed uterus and bladder)    At this time symptoms are worsening compared to onset w/ increased pain. She has been taking Tylenol arthritis and is resting as much as possible.  She had a hip injection in late Sept. 2018 in [place].  R hip injection in [place] and had a hip x-ray at that time.   ROS Reports night time disturbances. Denies fevers, chills, or night sweats. Denies unexplained weight loss. Denies personal history of cancer. Reports changes in bowel or bladder habits. Denies recent unreported falls. Denies new or worsening dyspnea or wheezing. Reports headaches or dizziness.  Diagnosed w/ cluster HA. Reports numbness, tingling or weakness  In the extremities - in R lower leg and foot/ankle. Denies dizziness or presyncopal episodes Denies lower extremity edema     HISTORY & PERTINENT PRIOR DATA:  Prior History reviewed and updated per electronic medical record.  Significant history, findings, studies and interim changes include:  reports that  has  never smoked. she has never used smokeless tobacco. No results for input(s): HGBA1C, LABURIC, CREATINE in the last 8760 hours. The 10-year ASCVD risk score Mikey Bussing DC Brooke Bonito., et al., 2013) is: 6.5%   Values used to calculate the score:     Age: 66 years     Sex: Female     Is Non-Hispanic African American: No     Diabetic: No     Tobacco smoker: No     Systolic Blood Pressure: 664 mmHg     Is BP treated: Yes     HDL Cholesterol: 68.1 mg/dL     Total Cholesterol: 246 mg/dL Problem  Hip Osteoarthritis   Bilateral right worse than left that is mild in nature X-rays obtained in [place] on 07/12/2017 that are available through the [facility] PACS system   Osteitis Pubis (Hcc)   Seen on x-rays of the hip from [facility] available [facility] PACs    Spondylosis of Lumbar Region Without Myelopathy Or Radiculopathy   MRI 11/01/2017: Severe L4-5 facet arthrosis on the right greater than left. Shallow disc bulging without significant neuroforaminal stenosis.     OBJECTIVE:  VS:  HT:5' 3.5" (161.3 cm)   WT:182 lb 3.2 oz (82.6 kg)  BMI:31.76    BP:122/90  HR:81bpm  TEMP: ( )  RESP:98 %   PHYSICAL EXAM: Constitutional: WDWN, Non-toxic appearing. Psychiatric: Alert & appropriately interactive.  Not depressed or anxious appearing. Respiratory: No increased work of breathing.  Trachea Midline Eyes: Pupils are equal.  EOM intact without nystagmus.  No scleral icterus  NEUROVASCULAR exam: No clubbing or cyanosis  appreciated No significant venous stasis changes Capillary Refill: normal, less than 2 seconds   LOWER Extremities  Pre-tibial edema: No significant pretibial edema Pedal Pulses: Normal & symmetrically palpable Dermatomes intact to light touch does report a small dysesthesia in the L4 distribution on the right Normal strength in all myotomes.  She is able to heel and toe walk without difficulty. Reflexes: Normal & symmetric DTRs   Bilateral hips: good internal and  external range of motion without significant pain until end range on the right.  She has a small amount of pain with straight leg raise but this is minimal.  Small amount of pain with femoral stretch test but mild as well.  Pain does seem to be worse with extension of her back however.  Pain does radiate into her groin with this.  Only mild pain over the pubic symphysis.  No additional findings.   ASSESSMENT & PLAN:   1. Fibromyalgia   2. Pelvic pain   3. Spondylosis of lumbar region without myelopathy or radiculopathy   4. Primary osteoarthritis of both hips   5. Osteitis pubis (HCC)    PLAN:    Spondylosis of lumbar region without myelopathy or radiculopathy Fairly significant lumbar spondylosis with a small facet cyst that is likely causing the majority of her symptoms.  Believe this does seem to be more refer facet mediated pain than anything.  We will plan to have her undergo diagnostic and therapeutic facet  injection with Dr. Ernestina Patches versus facet plus ESI injection.  Hip osteoarthritis Mild at this time with only minimal relief from the intra-articular injection.  Believe she has multifactorial pain that is only mildly contributed from her hip itself.  If any lack of improvement with diagnostic and therapeutic facet injection could consider MRI of the hip  Osteitis pubis ([place]) She does have fairly significant sclerotic change and small amount of pain over the pubic symphysis however this does not seem to be the majority of her pain I think is more suggestive of underlying pelvic weakness   ++++++++++++++++++++++++++++++++++++++++++++ Orders & Meds: Orders Placed This Encounter  Procedures  . Ambulatory referral to Physical Medicine Rehab    No orders of the defined types were placed in this encounter.   ++++++++++++++++++++++++++++++++++++++++++++ Follow-up: Return in about 8 weeks (around 12/30/2017).   Pertinent documentation may be included in additional procedure notes,  imaging studies, problem based documentation and patient instructions. Please see these sections of the encounter for additional information regarding this visit. CMA/ATC served as Education administrator during this visit. History, Physical, and Plan performed by medical provider. Documentation and orders reviewed and attested to.      Gerda Diss, [place] [facility] Physician

## 2017-11-05 ENCOUNTER — Encounter: Payer: Self-pay | Admitting: Sports Medicine

## 2017-11-05 DIAGNOSIS — M160 Bilateral primary osteoarthritis of hip: Secondary | ICD-10-CM | POA: Insufficient documentation

## 2017-11-05 DIAGNOSIS — M169 Osteoarthritis of hip, unspecified: Secondary | ICD-10-CM | POA: Insufficient documentation

## 2017-11-05 DIAGNOSIS — M869 Osteomyelitis, unspecified: Secondary | ICD-10-CM

## 2017-11-05 HISTORY — DX: Osteomyelitis, unspecified: M86.9

## 2017-11-05 HISTORY — DX: Osteoarthritis of hip, unspecified: M16.9

## 2017-11-05 NOTE — Assessment & Plan Note (Signed)
Mild at this time with only minimal relief from the intra-articular injection.  Believe she has multifactorial pain that is only mildly contributed from her hip itself.  If any lack of improvement with diagnostic and therapeutic facet injection could consider MRI of the hip

## 2017-11-05 NOTE — Assessment & Plan Note (Signed)
She does have fairly significant sclerotic change and small amount of pain over the pubic symphysis however this does not seem to be the majority of her pain I think is more suggestive of underlying pelvic weakness

## 2017-11-05 NOTE — Assessment & Plan Note (Addendum)
Fairly significant lumbar spondylosis with a small facet cyst that is likely causing the majority of her symptoms.  Believe this does seem to be more refer facet mediated pain than anything.  We will plan to have her undergo diagnostic and therapeutic facet  injection with Dr. Ernestina Patches versus facet plus ESI injection.

## 2017-11-15 ENCOUNTER — Ambulatory Visit: Payer: Medicare Other | Admitting: Family Medicine

## 2017-11-18 ENCOUNTER — Encounter: Payer: Self-pay | Admitting: Family Medicine

## 2017-11-18 DIAGNOSIS — H2511 Age-related nuclear cataract, right eye: Secondary | ICD-10-CM | POA: Diagnosis not present

## 2017-11-19 ENCOUNTER — Encounter: Payer: Self-pay | Admitting: Family Medicine

## 2017-11-19 ENCOUNTER — Ambulatory Visit (INDEPENDENT_AMBULATORY_CARE_PROVIDER_SITE_OTHER): Payer: Medicare Other | Admitting: Family Medicine

## 2017-11-19 VITALS — BP 126/88 | HR 91 | Temp 98.0°F | Wt 184.6 lb

## 2017-11-19 DIAGNOSIS — E8881 Metabolic syndrome: Secondary | ICD-10-CM | POA: Diagnosis not present

## 2017-11-19 DIAGNOSIS — H2512 Age-related nuclear cataract, left eye: Secondary | ICD-10-CM | POA: Diagnosis not present

## 2017-11-19 DIAGNOSIS — N8110 Cystocele, unspecified: Secondary | ICD-10-CM | POA: Diagnosis not present

## 2017-11-19 LAB — POCT GLYCOSYLATED HEMOGLOBIN (HGB A1C): Hemoglobin A1C: 5.8

## 2017-11-19 NOTE — Patient Instructions (Signed)
Your A1c is 5.8.

## 2017-11-19 NOTE — Progress Notes (Signed)
Margaret White is a 67 y.o. female is here for follow up.  History of Present Illness:   HPI: See Assessment and Plan section for Problem Based Charting of issues discussed today.   Health Maintenance Due  Topic Date Due  . HIV Screening  09/06/1967  . TETANUS/TDAP  09/06/1971  . PNA vac Low Risk Adult (1 of 2 - PCV13) 09/05/2017   Depression screen [facility] 2/9 11/19/2017 10/14/2017  Decreased Interest 0 0  Down, Depressed, Hopeless 0 0  PHQ - 2 Score 0 0  Altered sleeping - 1  Tired, decreased energy - 3  Change in appetite - 0  Feeling bad or failure about yourself  - 0  Trouble concentrating - 0  Moving slowly or fidgety/restless - 0  Suicidal thoughts - 0  PHQ-9 Score - 4   PMHx, SurgHx, SocialHx, FamHx, Medications, and Allergies were reviewed in the Visit Navigator and updated as appropriate.   Patient Active Problem List   Diagnosis Date Noted  . 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 GERD (gastroesophageal reflux disease) 10/14/2017  . Postmenopausal 10/14/2017  . Allergic rhinitis 10/14/2017  . Abnormal LFTs 10/14/2017  . Hepatic steatosis 10/14/2017  . OSA (obstructive sleep apnea) 10/14/2017  . RLS (restless legs syndrome) 10/14/2017  . History of cluster headache 10/14/2017  . IBS (irritable bowel syndrome) 10/14/2017  . Fibromyalgia 10/14/2017  . Chronic maxillary sinusitis 10/14/2017  . Pelvic pain 10/14/2017  . Obesity (BMI 30-39.9) 10/14/2017  . Mixed hyperlipidemia 10/14/2017  . Vitamin B12 deficiency 10/14/2017  . Vitamin D deficiency 10/14/2017  . Female cystocele 10/14/2017  . Primary osteoarthritis of both hands 07/10/2017  . Essential (primary) hypertension 06/26/2017  . Recurrent major depressive disorder, in full remission ([place]) 06/26/2017   Social History   Tobacco Use  . Smoking status: Never Smoker  . Smokeless tobacco: Never  Used  Substance Use Topics  . Alcohol use: No    Frequency: Never  . Drug use: No   Current Medications and Allergies:   .  FLUoxetine (PROZAC) 20 MG tablet, Take 20 mg by mouth daily., Disp: , Rfl:  .  fluticasone (FLONASE) 50 MCG/ACT nasal spray, Place into both nostrils as needed. , Disp: , Rfl:  .  hydrochlorothiazide (HYDRODIURIL) 25 MG tablet, Take 25 mg by mouth daily., Disp: , Rfl:  .  loperamide (IMODIUM) 2 MG capsule, Take by mouth as needed for diarrhea or loose stools., Disp: , Rfl:  .  montelukast (SINGULAIR) 10 MG tablet, Take 10 mg by mouth at bedtime., Disp: , Rfl:  .  potassium gluconate 595 (99 K) MG TABS tablet, Take 595 mg by mouth., Disp: , Rfl:  .  PROLENSA 0.07 % SOLN, INSTILL  1 DROP IN OD HS, Disp: , Rfl: 1 .  traMADol (ULTRAM) 50 MG tablet, Take 1 tablet (50 mg total) by mouth every 12 (twelve) hours as needed., Disp: 30 tablet, Rfl: 0   Allergies  Allergen Reactions  . Codeine Itching  . Sulfa Antibiotics Rash    Flu like symptoms   Review of Systems   Pertinent items are noted in the HPI. Otherwise, ROS is negative.  Vitals:   Vitals:   11/19/17 1321  BP: 126/88  Pulse: 91  Temp: 98 F (36.7 C)  TempSrc: Oral  SpO2: 95%  Weight: 184 lb 9.6 oz (83.7 kg)     Body mass index is  32.19 kg/m.   Physical Exam:   Physical Exam  Constitutional: She appears well-nourished.  HENT:  Head: Normocephalic and atraumatic.  Eyes: EOM are normal. Pupils are equal, round, and reactive to light.  Neck: Normal range of motion. Neck supple.  Cardiovascular: Normal rate, regular rhythm, normal heart sounds and intact distal pulses.  Pulmonary/Chest: Effort normal.  Abdominal: Soft.  Skin: Skin is warm.  Psychiatric: She has a normal mood and affect. Her behavior is normal.  Nursing note and vitals reviewed.   Results for orders placed or performed in visit on 11/19/17  POCT glycosylated hemoglobin (Hb A1C)  Result Value Ref Range   Hemoglobin A1C 5.8     Assessment and Plan:   Margaret White was seen today for urinary incontinence.  Diagnoses and all orders for this visit:  Insulin resistance Comments: The patient is asked to make an attempt to improve diet and exercise patterns to aid in medical management of this problem. Recheck in 3-4 months. Orders: -     POCT glycosylated hemoglobin (Hb A1C)  Female cystocele Comments: Patient has a long history of this. S/p bladder tac 30 years ago. Was told by current Urologist that if medications didn't work, that Botox would be an option. She would like a second opinion for possible surgical management again.   Orders: -     Ambulatory referral to Urology  . Reviewed expectations re: course of current medical issues. . Discussed self-management of symptoms. . Outlined signs and symptoms indicating need for more acute intervention. . Patient verbalized understanding and all questions were answered. Marland Kitchen Health Maintenance issues including appropriate healthy diet, exercise, and smoking avoidance were discussed with patient. . See orders for this visit as documented in the electronic medical record. . Patient received an After Visit Summary.  Briscoe Deutscher, DO [place] 11/19/2017  Future Appointments  Date Time Provider [place]  11/19/2017  2:40 PM Briscoe Deutscher, DO [facility]  11/20/2017  3:30 PM Magnus Sinning, MD [facility] None  12/30/2017  3:40 PM Gerda Diss, DO [facility]  01/20/2018  3:45 PM [facility] NURSE [facility]  03/24/2018  9:00 AM Briscoe Deutscher, DO [facility]

## 2017-11-20 ENCOUNTER — Encounter (INDEPENDENT_AMBULATORY_CARE_PROVIDER_SITE_OTHER): Payer: Self-pay | Admitting: Physical Medicine and Rehabilitation

## 2017-11-25 DIAGNOSIS — M159 Polyosteoarthritis, unspecified: Secondary | ICD-10-CM | POA: Diagnosis not present

## 2017-12-04 ENCOUNTER — Encounter: Payer: Self-pay | Admitting: Family Medicine

## 2017-12-04 ENCOUNTER — Other Ambulatory Visit: Payer: Self-pay

## 2017-12-04 DIAGNOSIS — F3342 Major depressive disorder, recurrent, in full remission: Secondary | ICD-10-CM

## 2017-12-05 ENCOUNTER — Other Ambulatory Visit: Payer: Self-pay | Admitting: Family Medicine

## 2017-12-05 ENCOUNTER — Encounter (INDEPENDENT_AMBULATORY_CARE_PROVIDER_SITE_OTHER): Payer: Self-pay | Admitting: Physical Medicine and Rehabilitation

## 2017-12-05 ENCOUNTER — Ambulatory Visit (INDEPENDENT_AMBULATORY_CARE_PROVIDER_SITE_OTHER): Payer: Self-pay

## 2017-12-05 ENCOUNTER — Ambulatory Visit (INDEPENDENT_AMBULATORY_CARE_PROVIDER_SITE_OTHER): Payer: Medicare Other | Admitting: Physical Medicine and Rehabilitation

## 2017-12-05 VITALS — BP 121/78 | HR 73 | Temp 98.3°F

## 2017-12-05 DIAGNOSIS — M47816 Spondylosis without myelopathy or radiculopathy, lumbar region: Secondary | ICD-10-CM

## 2017-12-05 MED ORDER — METHYLPREDNISOLONE ACETATE 80 MG/ML IJ SUSP
80.0000 mg | Freq: Once | INTRAMUSCULAR | Status: AC
  Administered 2017-12-05: 80 mg

## 2017-12-05 NOTE — Patient Instructions (Signed)

## 2017-12-05 NOTE — Progress Notes (Deleted)
Pt states pain in right groin, right hip, and right leg. Pt states pain started in September 2018. Pt states standing and walking makes pain worse, sitting with feet elevated makes pain better. +Driver, -BT, -Dye Allergies.

## 2017-12-06 DIAGNOSIS — H2512 Age-related nuclear cataract, left eye: Secondary | ICD-10-CM | POA: Diagnosis not present

## 2017-12-09 ENCOUNTER — Other Ambulatory Visit: Payer: Self-pay

## 2017-12-09 DIAGNOSIS — F3342 Major depressive disorder, recurrent, in full remission: Secondary | ICD-10-CM

## 2017-12-09 MED ORDER — FLUOXETINE HCL 20 MG PO TABS: 20.0000 mg | ORAL_TABLET | Freq: Every day | ORAL | 1 refills | Status: DC

## 2017-12-09 MED ORDER — LISINOPRIL 30 MG PO TABS: 30.0000 mg | ORAL_TABLET | Freq: Every day | ORAL | 1 refills | Status: DC

## 2017-12-11 NOTE — Procedures (Signed)
Lumbar Facet Joint Intra-Articular Injection(s) with Fluoroscopic Guidance  Patient: Margaret White      Date of Birth: December 29, 1950 MRN: [id number] PCP: Briscoe Deutscher, DO      Visit Date: 12/05/2017   Universal Protocol:    Date/Time: 12/05/2017  Consent Given By: the patient  Position: PRONE   Additional Comments: Vital signs were monitored before and after the procedure. Patient was prepped and draped in the usual sterile fashion. The correct patient, procedure, and site was verified.   Injection Procedure Details:  Procedure Site One Meds Administered:  Meds ordered this encounter  Medications  . methylPREDNISolone acetate (DEPO-MEDROL) injection 80 mg     Laterality: Right  Location/Site:  L4-L5  Needle size: 22 guage  Needle type: Spinal  Needle Placement: Articular  Findings:  -Comments: Excellent flow of contrast into the epidural space.  Procedure Details: The fluoroscope beam is vertically oriented in AP, and the inferior recess is visualized beneath the lower pole of the inferior apophyseal process, which represents the target point for needle insertion. When direct visualization is difficult the target point is located at the medial projection of the vertebral pedicle. The region overlying each aforementioned target is locally anesthetized with a 1 to 2 ml. volume of 1% Lidocaine without Epinephrine.   The spinal needle was inserted into each of the above mentioned facet joints using biplanar fluoroscopic guidance. A 0.25 to 0.5 ml. volume of Isovue-250 was injected and a partial facet joint arthrogram was obtained. A single spot film was obtained of the resulting arthrogram.    One to 1.25 ml of the steroid/anesthetic solution was then injected into each of the facet joints noted above.   Additional Comments:  The patient tolerated the procedure well Dressing: Band-Aid    Post-procedure details: Patient was observed during the  procedure. Post-procedure instructions were reviewed.  Patient left the clinic in stable condition.

## 2017-12-11 NOTE — Progress Notes (Signed)
Margaret White - 67 y.o. female MRN [id number]  Date of birth: May 04, 1951  Office Visit Note: Visit Date: 12/05/2017 PCP: Briscoe Deutscher, DO Referred by: Briscoe Deutscher, DO  Subjective: Chief Complaint  Patient presents with  . Right Hip - Pain  . Right Leg - Pain   HPI: Margaret White is a 67 year old female that comes in today at the request of Dr. Teresa Coombs for diagnostic and hopefully therapeutic right L4-5 facet joint block.  She is having chronic worsening right low back and hip and groin pain.  She does get some referral in the leg.  This is all been going on since September 2018.  She is failed conservative care with medication management as well as therapy.  She also has failed hip injection which was completed in [place].  She reports worsening with activity and walking and standing and some decrease with elevating her legs.  She has had MRI findings of the lumbar spine with a listhesis of L4 on L5 with facet joint arthritis and facet joint cyst at L4-5 on the right.    ROS Otherwise per HPI.  Assessment & Plan: Visit Diagnoses:  1. Spondylosis without myelopathy or radiculopathy, lumbar region     Plan: Findings:  Diagnostic and hopefully therapeutic right L4-5 facet joint block with fluoroscopic guidance.    Meds & Orders:  Meds ordered this encounter  Medications  . methylPREDNISolone acetate (DEPO-MEDROL) injection 80 mg    Orders Placed This Encounter  Procedures  . Facet Injection  . XR C-ARM NO REPORT    Follow-up: Return if symptoms worsen or fail to improve, for Dr. Paulla Fore.   Procedures: No procedures performed  Lumbar Facet Joint Intra-Articular Injection(s) with Fluoroscopic Guidance  Patient: Margaret White      Date of Birth: [birth year] MRN: [id number] PCP: Briscoe Deutscher, DO      Visit Date: 12/05/2017   Universal Protocol:    Date/Time: 12/05/2017  Consent Given By: the patient  Position: PRONE   Additional Comments: Vital signs  were monitored before and after the procedure. Patient was prepped and draped in the usual sterile fashion. The correct patient, procedure, and site was verified.   Injection Procedure Details:  Procedure Site One Meds Administered:  Meds ordered this encounter  Medications  . methylPREDNISolone acetate (DEPO-MEDROL) injection 80 mg     Laterality: Right  Location/Site:  L4-L5  Needle size: 22 guage  Needle type: Spinal  Needle Placement: Articular  Findings:  -Comments: Excellent flow of contrast into the epidural space.  Procedure Details: The fluoroscope beam is vertically oriented in AP, and the inferior recess is visualized beneath the lower pole of the inferior apophyseal process, which represents the target point for needle insertion. When direct visualization is difficult the target point is located at the medial projection of the vertebral pedicle. The region overlying each aforementioned target is locally anesthetized with a 1 to 2 ml. volume of 1% Lidocaine without Epinephrine.   The spinal needle was inserted into each of the above mentioned facet joints using biplanar fluoroscopic guidance. A 0.25 to 0.5 ml. volume of Isovue-250 was injected and a partial facet joint arthrogram was obtained. A single spot film was obtained of the resulting arthrogram.    One to 1.25 ml of the steroid/anesthetic solution was then injected into each of the facet joints noted above.   Additional Comments:  The patient tolerated the procedure well Dressing: Band-Aid    Post-procedure details: Patient was observed during the procedure.  Post-procedure instructions were reviewed.  Patient left the clinic in stable condition.    Clinical History: MRI LUMBAR SPINE WITHOUT CONTRAST  TECHNIQUE: Multiplanar, multisequence MR imaging of the lumbar spine was performed. No intravenous contrast was administered.  COMPARISON:  None.  FINDINGS: Segmentation: Standard. Rudimentary  disc material S1-2 incidentally noted.  Alignment: There is convex left scoliosis. Facet arthropathy results in 0.5 cm anterolisthesis L4 on L5. Trace retrolisthesis L1 on L2 noted.  Vertebrae: No fracture or worrisome lesion. Scattered mild degenerative endplate signal change is most notable at L1-2 and L5-S1.  Conus medullaris and cauda equina: Conus extends to the L1-2 level. Conus and cauda equina appear normal.  Paraspinal and other soft tissues: Negative.  Disc levels:  T11-12 is imaged in the sagittal plane only. There is a shallow disc bulge at this level without stenosis.  T12-L1: Minimal left paracentral protrusion without stenosis.  L1-2: Negative.  L2-3: Mild facet degenerative change.  Otherwise negative.  L3-4: Minimal disc bulge to the right without stenosis.  L4-5: Moderate to advanced facet degenerative change is present. A small synovial cyst off the medial margin of the right facet measures 0.6 cm AP by 0.2 cm transverse. The disc is uncovered with a slight bulge but the central canal and neural foramina are open.  L5-S1: Facet degenerative disease. Annular fissure and shallow central protrusion without central canal or foraminal stenosis.  IMPRESSION: Negative for central canal or foraminal narrowing. Spondylosis worst at L4-5 where there is 0.5 cm anterolisthesis due to facet degenerative disease. Shallow disc bulge is noted at this level without stenosis.   Electronically Signed   By: Inge Rise M.D.   On: 11/01/2017 19:17  She reports that  has never smoked. she has never used smokeless tobacco.  Recent Labs    11/19/17 1348  HGBA1C 5.8    Objective:  VS:  HT:    WT:   BMI:     BP:121/78  HR:73bpm  TEMP:98.3 F (36.8 C)(Oral)  RESP:99 % Physical Exam  Musculoskeletal:  Patient has concordant low back pain with extension rotation to the right.  She has no real pain with hip rotation.    Ortho  Exam Imaging: No results found.  Past Medical/Family/Surgical/Social History: Medications & Allergies reviewed per EMR Patient Active Problem List   Diagnosis Date Noted  . 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 GERD (gastroesophageal reflux disease) 10/14/2017  . Postmenopausal 10/14/2017  . Allergic rhinitis 10/14/2017  . Abnormal LFTs 10/14/2017  . Hepatic steatosis 10/14/2017  . OSA (obstructive sleep apnea) 10/14/2017  . RLS (restless legs syndrome) 10/14/2017  . History of cluster headache 10/14/2017  . IBS (irritable bowel syndrome) 10/14/2017  . Fibromyalgia 10/14/2017  . Chronic maxillary sinusitis 10/14/2017  . Pelvic pain 10/14/2017  . Obesity (BMI 30-39.9) 10/14/2017  . Mixed hyperlipidemia 10/14/2017  . Vitamin B12 deficiency 10/14/2017  . Vitamin D deficiency 10/14/2017  . Female cystocele 10/14/2017  . Primary osteoarthritis of both hands 07/10/2017  . Essential (primary) hypertension 06/26/2017  . Recurrent major depressive disorder, in full remission ([place]) 06/26/2017   Past Medical History:  Diagnosis Date  . Abnormal LFTs 10/14/2017  . Allergic rhinitis 10/14/2017  . Essential (primary) hypertension 06/26/2017  . Fibromyalgia 10/14/2017  . GERD (gastroesophageal reflux disease) 10/14/2017  . Hepatic steatosis 10/14/2017  . History of cluster headache 10/14/2017  . IBS (irritable bowel syndrome) 10/14/2017  . OSA (  obstructive sleep apnea) 10/14/2017  . Postmenopausal 10/14/2017  . Primary osteoarthritis of both hands 07/10/2017  . Recurrent major depressive disorder, in full remission ([place]) 06/26/2017  . RLS (restless legs syndrome) 10/14/2017   Family History  Problem Relation Age of Onset  . Hypertension Mother   . Cancer Father   . Heart disease Father    Past Surgical History:  Procedure Laterality Date  . BREAST BIOPSY Left    approximately 10  years ago/ benign    . LAPAROSCOPIC TOTAL HYSTERECTOMY    . RHINOPLASTY     Social History   Occupational History  . Occupation: Product manager: [employer]  Tobacco Use  . Smoking status: Never Smoker  . Smokeless tobacco: Never Used  Substance and Sexual Activity  . Alcohol use: No    Frequency: Never  . Drug use: No  . Sexual activity: Not on file

## 2017-12-23 ENCOUNTER — Encounter: Payer: Self-pay | Admitting: Sports Medicine

## 2017-12-30 ENCOUNTER — Ambulatory Visit: Payer: TRICARE For Life (TFL) | Admitting: Sports Medicine

## 2017-12-31 ENCOUNTER — Ambulatory Visit: Payer: Self-pay

## 2017-12-31 ENCOUNTER — Ambulatory Visit (INDEPENDENT_AMBULATORY_CARE_PROVIDER_SITE_OTHER): Payer: Medicare Other | Admitting: Sports Medicine

## 2017-12-31 ENCOUNTER — Encounter: Payer: Self-pay | Admitting: Sports Medicine

## 2017-12-31 VITALS — BP 128/82 | HR 64 | Ht 63.5 in | Wt 181.4 lb

## 2017-12-31 DIAGNOSIS — M25551 Pain in right hip: Secondary | ICD-10-CM

## 2017-12-31 DIAGNOSIS — M1610 Unilateral primary osteoarthritis, unspecified hip: Secondary | ICD-10-CM

## 2017-12-31 DIAGNOSIS — M869 Osteomyelitis, unspecified: Secondary | ICD-10-CM | POA: Diagnosis not present

## 2017-12-31 NOTE — Progress Notes (Signed)
Margaret White. Margaret White, Margaret White at [place]  Margaret White - 67 y.o. female MRN [id number]  Date of birth: [birth year]  Visit Date: 12/31/2017  PCP: Briscoe Deutscher, DO   Referred by: Briscoe Deutscher, DO  Scribe for today's visit: Josepha Pigg, CMA     SUBJECTIVE:  Margaret White is here for Follow-up (Osteitis pubis, fibromyalgia)  11/04/17: Her R pelvic/groin pain symptoms INITIALLY: Began late September 2018 - insidious onset Described as 4-5/10 at rest and 7-8/10 w/ weight bearing describes it as pinching, aching, sharp and burning at times, radiating to R LE particularly the R knee and proximal tibia. Worsened with walking, hip flexion Improved with rest/taking weight off her R leg Additional associated symptoms include: some intermittent N/T in her R foot.  Reports trouble w/ her bladder over the past year (hx of prolapsed uterus and bladder) At this time symptoms are worsening compared to onset w/ increased pain. She has been taking Tylenol arthritis and is resting as much as possible.  She had a hip injection in late Sept. 2018 in [place]. R hip injection in [place] and had a hip x-ray at that time.   12/31/17: Compared to the last office visit, her previously described symptoms are improving. Radiating pain resolved on the lateral aspect of the leg but has flared up on the medial aspect of the thigh. Pain on the inner thigh has gotten worse over time.  Current symptoms are severe & are radiating to the back or the medial thigh. She has pain between steps, pain is severe and causes her to pause between steps. Pain normally resolves when sitting but yesterday she had pain while sitting and felt radiating pain down the anterior LE.  She has been taking Tylenol with some relief. She has not started PT yet. She has tried Tramadol at bedtime and got some relief from nighttime sx.   ROS Denies night time  disturbances. Denies fevers, chills, or night sweats. Denies unexplained weight loss. Denies personal history of cancer. Denies changes in bowel or bladder habits. Denies recent unreported falls. Denies new or worsening dyspnea or wheezing. Denies headaches or dizziness.  Reports numbness, tingling or weakness  In the extremities.  Denies dizziness or presyncopal episodes Denies lower extremity edema - legs feel tight but don't appear to be swollen, this comes and goes.   HISTORY & PERTINENT PRIOR DATA:  Prior History reviewed and updated per electronic medical record.  Significant/pertinent history, findings, studies include:  reports that she has never smoked. She has never used smokeless tobacco. Recent Labs    11/19/17 1348  HGBA1C 5.8   The 10-year ASCVD risk score Mikey Bussing DC Jr., et al., 2013) is: 6.5%   Values used to calculate the score:     Age: 77 years     Sex: Female     Is Non-Hispanic African American: No     Diabetic: No     Tobacco smoker: No     Systolic Blood Pressure: 696 mmHg     Is BP treated: Yes     HDL Cholesterol: 68.1 mg/dL     Total Cholesterol: 246 mg/dL No problems updated.  OBJECTIVE:  VS:  HT:5' 3.5" (161.3 cm)   WT:181 lb 6.4 oz (82.3 kg)  BMI:31.63    BP:128/82  HR:64bpm  TEMP: ( )  RESP:98 %   PHYSICAL EXAM: Constitutional: WDWN, Non-toxic appearing. Psychiatric: Alert & appropriately interactive.  Not depressed or anxious appearing. Respiratory:  No increased work of breathing.  Trachea Midline Eyes: Pupils are equal.  EOM intact without nystagmus.  No scleral icterus  VASCULAR: warm to touch calf supple with no pain with squeeze lower extremity neuro exam: unremarkable  Right hip in a seated position and has good internal and external rotation as well as flexion extension.  In the laying position however she has marked pain with logroll and external rotation localizing to the groin. Painful Stinchfield testing.  Negative straight  leg raise at this time.   ASSESSMENT & PLAN:   1. Primary osteoarthritis of hip, unspecified laterality   2. Pain of right hip joint     PLAN: Intra-articular injection performed today per procedure note.  This both diagnostic and therapeutic.  If good but short-term improvement can consider further diagnostic evaluation with MRI given that she has only moderate osteoarthritis.  This is possibly reflective of a labral tear and if persistent symptoms arthroscopic intervention could be considered although we discussed that this is less than ideal treatment.    Follow-up: Return in about 6 weeks (around 02/11/2018).

## 2017-12-31 NOTE — Procedures (Signed)
PROCEDURE NOTE:  Ultrasound Guided: Injection: right Hip Images were obtained and interpreted by myself, Teresa Coombs, DO  Images have been saved and stored to PACS system. Images obtained on: GE S7 Ultrasound machine  ULTRASOUND FINDINGS:  Small hip effusion Possible anterior labral tear  DESCRIPTION OF PROCEDURE:  The patient's clinical condition is marked by substantial pain and/or significant functional disability. Other conservative therapy has not provided relief, is contraindicated, or not appropriate. There is a reasonable likelihood that injection will significantly improve the patient's pain and/or functional impairment.  After discussing the risks, benefits and expected outcomes of the injection and all questions were reviewed and answered, the patient wished to undergo the above named procedure. Verbal consent was obtained.  The ultrasound was used to identify the target structure and adjacent neurovascular structures. The skin was then prepped in sterile fashion and the target structure was injected under direct visualization using sterile technique as below:  PREP: Alcohol, Ethel Chloride  APPROACH: direct, stopcock technique, 22g 3.5in.  INJECTATE: 5cc: 1% lidocaine, 2cc: 0.5% marcaine, 2cc: 66m/mL DepoMedrol   ASPIRATE: None   DRESSING: Band-Aid   Post procedural instructions including recommending icing and warning signs for infection were reviewed.   This procedure was well tolerated and there were no complications.   IMPRESSION: Succesful Ultrasound Guided: Injection

## 2017-12-31 NOTE — Patient Instructions (Signed)

## 2018-01-03 ENCOUNTER — Ambulatory Visit: Payer: TRICARE For Life (TFL) | Admitting: Sports Medicine

## 2018-01-06 DIAGNOSIS — H04123 Dry eye syndrome of bilateral lacrimal glands: Secondary | ICD-10-CM | POA: Diagnosis not present

## 2018-01-14 DIAGNOSIS — H04123 Dry eye syndrome of bilateral lacrimal glands: Secondary | ICD-10-CM | POA: Diagnosis not present

## 2018-01-15 DIAGNOSIS — H04123 Dry eye syndrome of bilateral lacrimal glands: Secondary | ICD-10-CM | POA: Diagnosis not present

## 2018-01-20 ENCOUNTER — Ambulatory Visit: Payer: Medicare Other

## 2018-01-21 ENCOUNTER — Ambulatory Visit: Payer: Medicare Other

## 2018-02-03 DIAGNOSIS — J069 Acute upper respiratory infection, unspecified: Secondary | ICD-10-CM | POA: Diagnosis not present

## 2018-02-05 DIAGNOSIS — R351 Nocturia: Secondary | ICD-10-CM | POA: Diagnosis not present

## 2018-02-05 DIAGNOSIS — R350 Frequency of micturition: Secondary | ICD-10-CM | POA: Diagnosis not present

## 2018-02-05 DIAGNOSIS — N3942 Incontinence without sensory awareness: Secondary | ICD-10-CM | POA: Diagnosis not present

## 2018-02-05 DIAGNOSIS — N3946 Mixed incontinence: Secondary | ICD-10-CM | POA: Diagnosis not present

## 2018-02-07 DIAGNOSIS — E559 Vitamin D deficiency, unspecified: Secondary | ICD-10-CM | POA: Diagnosis not present

## 2018-02-07 DIAGNOSIS — E669 Obesity, unspecified: Secondary | ICD-10-CM | POA: Diagnosis not present

## 2018-02-07 DIAGNOSIS — I10 Essential (primary) hypertension: Secondary | ICD-10-CM | POA: Diagnosis not present

## 2018-02-11 ENCOUNTER — Ambulatory Visit: Payer: Medicare Other | Admitting: Sports Medicine

## 2018-02-12 ENCOUNTER — Ambulatory Visit (INDEPENDENT_AMBULATORY_CARE_PROVIDER_SITE_OTHER): Payer: Medicare Other | Admitting: Family Medicine

## 2018-02-12 ENCOUNTER — Encounter: Payer: Self-pay | Admitting: Family Medicine

## 2018-02-12 ENCOUNTER — Ambulatory Visit (INDEPENDENT_AMBULATORY_CARE_PROVIDER_SITE_OTHER): Payer: Medicare Other

## 2018-02-12 VITALS — BP 120/72 | HR 91 | Temp 99.0°F | Ht 63.5 in | Wt 181.0 lb

## 2018-02-12 DIAGNOSIS — S79911A Unspecified injury of right hip, initial encounter: Secondary | ICD-10-CM | POA: Diagnosis not present

## 2018-02-12 DIAGNOSIS — L719 Rosacea, unspecified: Secondary | ICD-10-CM | POA: Diagnosis not present

## 2018-02-12 DIAGNOSIS — M25551 Pain in right hip: Secondary | ICD-10-CM

## 2018-02-12 MED ORDER — KETOROLAC TROMETHAMINE 10 MG PO TABS: 10.0000 mg | ORAL_TABLET | Freq: Four times a day (QID) | ORAL | 0 refills | Status: DC | PRN

## 2018-02-12 NOTE — Progress Notes (Signed)
Margaret White is a 67 y.o. female is here for an acute visit.  History of Present Illness:   [name], CMA acting as scribe for Dr. Briscoe Deutscher.   HPI: Patient having right hip pain. 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   Hip Pain Patient complains of right hip pain. Onset of the symptoms was several days ago. 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 The patient reports the hip pain is worse with any activity.. Associated symptoms: none, injury. Aggravating symptoms include: any weight bearing. Patient has had prior hip problems. Previous visits for this problem: Hx of hip and pelvis pain. Evaluation to date: xray, [place], sports medicine evaluation. She was seen at urgent care after fall. No xray done only given muscle relaxant that was not helpful.   Health Maintenance Due  Topic Date Due  . HIV Screening  09/06/1967  . TETANUS/TDAP  09/06/1971  . PNA vac Low Risk Adult (1 of 2 - PCV13) 09/05/2017   Depression screen [facility] 2/9 11/19/2017 10/14/2017  Decreased Interest 0 0  Down, Depressed, Hopeless 0 0  PHQ - 2 Score 0 0  Altered sleeping - 1  Tired, decreased energy - 3  Change in appetite - 0  Feeling bad or failure about yourself  - 0  Trouble concentrating - 0  Moving slowly or fidgety/restless - 0  Suicidal thoughts - 0  PHQ-9 Score - 4   PMHx, SurgHx, SocialHx, FamHx, Medications, and Allergies were reviewed in the Visit Navigator and updated as appropriate.   Patient Active Problem List   Diagnosis Date Noted  . 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 GERD (gastroesophageal reflux disease) 10/14/2017  . Postmenopausal 10/14/2017  . Allergic rhinitis 10/14/2017  . Abnormal LFTs 10/14/2017  . Hepatic steatosis 10/14/2017  . OSA (obstructive sleep apnea) 10/14/2017  . RLS (restless legs syndrome) 10/14/2017  . History of cluster  headache 10/14/2017  . IBS (irritable bowel syndrome) 10/14/2017  . Fibromyalgia 10/14/2017  . Chronic maxillary sinusitis 10/14/2017  . Pelvic pain 10/14/2017  . Obesity (BMI 30-39.9) 10/14/2017  . Mixed hyperlipidemia 10/14/2017  . Vitamin B12 deficiency 10/14/2017  . Vitamin D deficiency 10/14/2017  . Female cystocele 10/14/2017  . Primary osteoarthritis of both hands 07/10/2017  . Essential (primary) hypertension 06/26/2017  . Recurrent major depressive disorder, in full remission ([place]) 06/26/2017   Social History   Tobacco Use  . Smoking status: Never Smoker  . Smokeless tobacco: Never Used  Substance Use Topics  . Alcohol use: No    Frequency: Never  . Drug use: No   Current Medications and Allergies:   .  FLUoxetine (PROZAC) 20 MG tablet, Take 1 tablet (20 mg total) by mouth daily., Disp: 90 tablet, Rfl: 1 .  fluticasone (FLONASE) 50 MCG/ACT nasal spray, Place into both nostrils as needed. , Disp: , Rfl:  .  hydrochlorothiazide (HYDRODIURIL) 25 MG tablet, Take 25 mg by mouth daily., Disp: , Rfl:  .  lisinopril (PRINIVIL,ZESTRIL) 30 MG tablet, Take 1 tablet (30 mg total) by mouth daily., Disp: 90 tablet, Rfl: 1 .  loperamide (IMODIUM) 2 MG capsule, Take by mouth as needed for diarrhea or loose stools., Disp: , Rfl:  .  montelukast (SINGULAIR) 10 MG tablet, Take 10 mg by mouth at bedtime., Disp: , Rfl:  .  potassium gluconate 595 (99 K) MG TABS tablet, Take 595 mg by  mouth., Disp: , Rfl:  .  PROLENSA 0.07 % SOLN, INSTILL  1 DROP IN OD HS, Disp: , Rfl: 1   Allergies  Allergen Reactions  . Codeine Itching  . Sulfa Antibiotics Rash    Flu like symptoms   Review of Systems   Pertinent items are noted in the HPI. Otherwise, ROS is negative.  Vitals:   Vitals:   02/12/18 1610  BP: 120/72  Pulse: 91  Temp: 99 F (37.2 C)  TempSrc: Oral  SpO2: 96%  Weight: 181 lb (82.1 kg)  Height: 5' 3.5" (1.613 m)     Body mass index is 31.56 kg/m.  Physical Exam:    Physical Exam  Constitutional: She appears well-nourished.  HENT:  Head: Normocephalic and atraumatic.  Eyes: Pupils are equal, round, and reactive to light. EOM are normal.  Neck: Normal range of motion. Neck supple.  Cardiovascular: Normal rate, regular rhythm, normal heart sounds and intact distal pulses.  Pulmonary/Chest: Effort normal.  Abdominal: Soft.  Musculoskeletal:       Right hip: She exhibits decreased range of motion.       Legs: Skin: Skin is warm.  Psychiatric: She has a normal mood and affect. Her behavior is normal.  Nursing note and vitals reviewed.   Assessment and Plan:   Sianne was seen today for fall.  Diagnoses and all orders for this visit:  Pain of right hip joint Comments: Strain, contusion. Orders: -     DG HIP UNILAT W OR W/O PELVIS 2-3 VIEWS RIGHT -     ketorolac (TORADOL) 10 MG tablet; Take 1 tablet (10 mg total) by mouth every 6 (six) hours as needed.  Rosacea -     Ambulatory referral to Dermatology   . Reviewed expectations re: course of current medical issues. . Discussed self-management of symptoms. . Outlined signs and symptoms indicating need for more acute intervention. . Patient verbalized understanding and all questions were answered. Marland Kitchen Health Maintenance issues including appropriate healthy diet, exercise, and smoking avoidance were discussed with patient. . See orders for this visit as documented in the electronic medical record. . Patient received an After Visit Summary.  Briscoe Deutscher, DO [place], [facility] [place] 02/12/2018  Future Appointments  Date Time Provider [place]  02/20/2018  3:40 PM Gerda Diss, DO [facility]  03/24/2018  9:00 AM Briscoe Deutscher, DO [facility]    CMA served as scribe during this visit. History, Physical, and Plan performed by medical provider. The above documentation has been reviewed and is accurate and complete. Briscoe Deutscher, D.O.

## 2018-02-14 ENCOUNTER — Other Ambulatory Visit: Payer: Self-pay

## 2018-02-14 DIAGNOSIS — M25551 Pain in right hip: Secondary | ICD-10-CM

## 2018-02-20 ENCOUNTER — Ambulatory Visit: Payer: Medicare Other | Admitting: Sports Medicine

## 2018-02-25 ENCOUNTER — Other Ambulatory Visit: Payer: TRICARE For Life (TFL)

## 2018-02-26 ENCOUNTER — Ambulatory Visit: Payer: TRICARE For Life (TFL) | Admitting: Sports Medicine

## 2018-02-26 ENCOUNTER — Ambulatory Visit
Admission: RE | Admit: 2018-02-26 | Discharge: 2018-02-26 | Disposition: A | Discharge status: Home / Self Care | Payer: Medicare Other | Source: Ambulatory Visit | Attending: Family Medicine | Admitting: Family Medicine

## 2018-02-26 DIAGNOSIS — M25551 Pain in right hip: Secondary | ICD-10-CM | POA: Diagnosis not present

## 2018-02-28 ENCOUNTER — Ambulatory Visit (INDEPENDENT_AMBULATORY_CARE_PROVIDER_SITE_OTHER): Payer: Medicare Other | Admitting: Orthopaedic Surgery

## 2018-02-28 ENCOUNTER — Encounter (INDEPENDENT_AMBULATORY_CARE_PROVIDER_SITE_OTHER): Payer: Self-pay | Admitting: Orthopaedic Surgery

## 2018-02-28 VITALS — BP 119/89 | HR 96 | Ht 63.0 in | Wt 180.0 lb

## 2018-02-28 DIAGNOSIS — M25551 Pain in right hip: Secondary | ICD-10-CM | POA: Diagnosis not present

## 2018-02-28 NOTE — Progress Notes (Signed)
Office Visit Note   Patient: Margaret White           Date of Birth: July 13, 1951           MRN: [id number] Visit Date: 02/28/2018              Requested by: Briscoe Deutscher, [address] [place], [place] [id number] PCP: Briscoe Deutscher, DO   Assessment & Plan: Visit Diagnoses:  1. Pain of right hip joint     Plan: Avascular necrosis right hip with osteoarthritis.  Long discussion regarding findings on x-ray and MRI scan.  I believe Margaret White is a candidate for total hip replacement.  She might be better treated through an anterior approach.  We will ask Dr. Ninfa Linden to evaluate.  Follow-Up Instructions: Return if symptoms worsen or fail to improve.   Orders:  No orders of the defined types were placed in this encounter.  No orders of the defined types were placed in this encounter.     Procedures: No procedures performed   Clinical Data: No additional findings.   Subjective: Chief Complaint  Patient presents with  . Right Hip - Pain  . New Patient (Initial Visit)    r hip pain sept 2018, pt had fall on r hip 10 days ago had steroid inj about 1 mo ago w dr Paulla Fore  Margaret White is 67 years old and visits the office for evaluation of right hip pain.  She first noted insidious onset and August or September 2018.  She denies any injury or trauma.  She also relates that initially her x-rays were negative but eventually reveals some osteoarthritis and possibly avascular necrosis.  She recently had an MRI scan performed through [facility] demonstrating avascular necrosis of the femoral head involving area of 2.4 x 2 the subchondral bone was somewhat flattened and there was surrounding marrow edema.  The articular cartilage was markedly thinned on the right subchondral cysts in the acetabulum as well as the femoral head.  She has had several cortisone injections and even to try at oxycodone with Tylenol.  She has history of fatty liver and is limited in  her ability to take anti-inflammatory medicines.  She also has IBS.  Is reached a point where she is having pain that compromises her vocational and avocational activities.  She has been using a cane.  Also has a history of fibromyalgia. I did review films of her pelvis and right hip on the PACS system and agree with the avascular necrosis.  There is some early collapse joint surface of the femoral head.  Appear to be cysts on both sides of the joint.  No obvious abscess on the left. Etiology of the avascular necrosis of might be related to her chronic use of either prednisone or cortisone injections for chronic sinusitis.  Has been treated over the past 4 years for episodes of the above  HPI  Review of Systems  Constitutional: Positive for fatigue.  HENT: Negative for ear pain.   Eyes: Negative for pain.  Respiratory: Negative for cough and shortness of breath.   Cardiovascular: Negative for leg swelling.  Gastrointestinal: Positive for constipation and diarrhea.  Genitourinary: Negative for difficulty urinating.  Musculoskeletal: Positive for back pain and neck pain.  Skin: Negative for rash.  Allergic/Immunologic: Positive for food allergies.  Neurological: Positive for weakness and numbness.  Hematological: Bruises/bleeds easily.  Psychiatric/Behavioral: Negative for sleep disturbance.     Objective: Vital Signs: BP 119/89 (  BP Location: Left Arm, Patient Position: Sitting, Cuff Size: Normal)   Pulse 96   Ht 5' 3"  (1.6 m)   Wt 180 lb (81.6 kg)   BMI 31.89 kg/m   Physical Exam  Constitutional: She is oriented to person, place, and time. She appears well-developed and well-nourished.  HENT:  Mouth/Throat: Oropharynx is clear and moist.  Eyes: Pupils are equal, round, and reactive to light. EOM are normal.  Pulmonary/Chest: Effort normal.  Neurological: She is alert and oriented to person, place, and time.  Skin: Skin is warm and dry.  Psychiatric: She has a normal mood and  affect. Her behavior is normal.    Ortho Exam awake alert and oriented x3 comfortable sitting.  Uses a single-point cane in her right hand.  No shortness of breath or chest pain.  Appropriate responses.  Mild pain with internal/external rotation of her right hip but significant pain with weightbearing.  No pain referable to the left hip.  No distal edema.  Neurovascular exam intact.  Leg lengths appear to be symmetrical.  Straight leg raise negative.  No percussible tenderness of the lumbar spine.  Specialty Comments:  No specialty comments available.  Imaging: No results found.   PMFS History: Patient Active Problem List   Diagnosis Date Noted  . 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 GERD (gastroesophageal reflux disease) 10/14/2017  . Postmenopausal 10/14/2017  . Allergic rhinitis 10/14/2017  . Abnormal LFTs 10/14/2017  . Hepatic steatosis 10/14/2017  . OSA (obstructive sleep apnea) 10/14/2017  . RLS (restless legs syndrome) 10/14/2017  . History of cluster headache 10/14/2017  . IBS (irritable bowel syndrome) 10/14/2017  . Fibromyalgia 10/14/2017  . Chronic maxillary sinusitis 10/14/2017  . Pelvic pain 10/14/2017  . Obesity (BMI 30-39.9) 10/14/2017  . Mixed hyperlipidemia 10/14/2017  . Vitamin B12 deficiency 10/14/2017  . Vitamin D deficiency 10/14/2017  . Female cystocele 10/14/2017  . Primary osteoarthritis of both hands 07/10/2017  . Essential (primary) hypertension 06/26/2017  . Recurrent major depressive disorder, in full remission ([place]) 06/26/2017   Past Medical History:  Diagnosis Date  . Abnormal LFTs 10/14/2017  . Allergic rhinitis 10/14/2017  . Essential (primary) hypertension 06/26/2017  . Fibromyalgia 10/14/2017  . GERD (gastroesophageal reflux disease) 10/14/2017  . Hepatic steatosis 10/14/2017  . History of cluster headache 10/14/2017  . IBS (irritable  bowel syndrome) 10/14/2017  . OSA (obstructive sleep apnea) 10/14/2017  . Postmenopausal 10/14/2017  . Primary osteoarthritis of both hands 07/10/2017  . Recurrent major depressive disorder, in full remission ([place]) 06/26/2017  . RLS (restless legs syndrome) 10/14/2017    Family History  Problem Relation Age of Onset  . Hypertension Mother   . Cancer Father   . Heart disease Father     Past Surgical History:  Procedure Laterality Date  . BREAST BIOPSY Left    approximately 10 years ago/ benign    . LAPAROSCOPIC TOTAL HYSTERECTOMY    . RHINOPLASTY     Social History   Occupational History  . Occupation: Product manager: [employer]  Tobacco Use  . Smoking status: Never Smoker  . Smokeless tobacco: Never Used  Substance and Sexual Activity  . Alcohol use: No    Frequency: Never  . Drug use: No  . Sexual activity: Not on file

## 2018-03-23 NOTE — Progress Notes (Signed)
Margaret White is a 67 y.o. female is here for follow up.  History of Present Illness:   Margaret White, CMA acting as scribe for Dr. Briscoe Deutscher.   HPI: Patient in office for six month follow up. She would like to have fasting labs today. Last visit with our office was due to injury at work. She has a follow up today with orthopedics today to schedule surgery.    She has been working on weight loss. Eating well. Drinking lots of water.   Review of Systems  Constitutional: Negative for chills, fever, malaise/fatigue and weight loss.  Respiratory: Negative for cough, shortness of breath and wheezing.   Cardiovascular: Negative for chest pain, palpitations and leg swelling.  Gastrointestinal: Negative for abdominal pain, constipation, diarrhea, nausea and vomiting.  Genitourinary: Negative for dysuria and urgency.  Musculoskeletal: Positive for joint pain. Negative for myalgias.  Skin: Negative for rash.  Neurological: Negative for dizziness and headaches.  Psychiatric/Behavioral: Negative for depression, substance abuse and suicidal ideas. The patient is not nervous/anxious.    Health Maintenance Due  Topic Date Due  . HIV Screening  09/06/1967  . TETANUS/TDAP  09/06/1971  . PNA vac Low Risk Adult (1 of 2 - PCV13) 09/05/2017   Depression screen [facility] 2/9 03/24/2018 11/19/2017 10/14/2017  Decreased Interest 3 0 0  Down, Depressed, Hopeless 0 0 0  PHQ - 2 Score 3 0 0  Altered sleeping 1 - 1  Tired, decreased energy 3 - 3  Change in appetite 0 - 0  Feeling bad or failure about yourself  0 - 0  Trouble concentrating 0 - 0  Moving slowly or fidgety/restless 0 - 0  Suicidal thoughts 0 - 0  PHQ-9 Score 7 - 4  Difficult doing work/chores Not difficult at all - -   PMHx, SurgHx, SocialHx, FamHx, Medications, and Allergies were reviewed in the Visit Navigator and updated as appropriate.   Patient Active Problem List   Diagnosis Date Noted  . Avascular necrosis ([place]) 03/24/2018    . 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 GERD (gastroesophageal reflux disease) 10/14/2017  . Postmenopausal 10/14/2017  . Allergic rhinitis 10/14/2017  . Abnormal LFTs 10/14/2017  . Hepatic steatosis 10/14/2017  . OSA (obstructive sleep apnea) 10/14/2017  . RLS (restless legs syndrome) 10/14/2017  . History of cluster headache 10/14/2017  . IBS (irritable bowel syndrome) 10/14/2017  . Fibromyalgia 10/14/2017  . Chronic maxillary sinusitis 10/14/2017  . Pelvic pain 10/14/2017  . Obesity (BMI 30-39.9) 10/14/2017  . Mixed hyperlipidemia 10/14/2017  . Vitamin B12 deficiency 10/14/2017  . Vitamin D deficiency 10/14/2017  . Female cystocele 10/14/2017  . Primary osteoarthritis of both hands 07/10/2017  . Essential (primary) hypertension 06/26/2017  . Recurrent major depressive disorder, in full remission ([place]) 06/26/2017   Social History   Tobacco Use  . Smoking status: Never Smoker  . Smokeless tobacco: Never Used  Substance Use Topics  . Alcohol use: No    Frequency: Never  . Drug use: No   Current Medications and Allergies:   .  FLUoxetine (PROZAC) 20 MG tablet, Take 1 tablet (20 mg total) by mouth daily., Disp: 90 tablet, Rfl: 1 .  fluticasone (FLONASE) 50 MCG/ACT nasal spray, Place 1 spray into both nostrils daily., Disp: 16 g, Rfl: 3 .  hydrochlorothiazide (HYDRODIURIL) 25 MG tablet, Take 1 tablet (25 mg total) by mouth daily., Disp: 90 tablet, Rfl: 3 .  lisinopril (PRINIVIL,ZESTRIL) 30 MG tablet, Take 1 tablet (30 mg total) by mouth daily., Disp: 90 tablet, Rfl: 1 .  loperamide (IMODIUM) 2 MG capsule, Take by mouth as needed for diarrhea or loose stools., Disp: , Rfl:  .  montelukast (SINGULAIR) 10 MG tablet, Take 1 tablet (10 mg total) by mouth at bedtime., Disp: 90 tablet, Rfl: 3 .  potassium gluconate 595 (99 K) MG TABS tablet, Take 595 mg by mouth., Disp: ,  Rfl:  .  Tdap (BOOSTRIX) 5-2.5-18.5 LF-MCG/0.5 injection, Inject 0.5 mLs into the muscle once for 1 dose., Disp: 0.5 mL, Rfl: 0   Allergies  Allergen Reactions  . Codeine Itching  . Sulfa Antibiotics Rash    Flu like symptoms   Review of Systems   Pertinent items are noted in the HPI. Otherwise, ROS is negative.  Vitals:   Vitals:   03/24/18 0903  BP: 120/72  Pulse: 78  Temp: 98.7 F (37.1 C)  TempSrc: Oral  SpO2: 96%  Weight: 176 lb 9.6 oz (80.1 kg)  Height: 5' 3"  (1.6 m)     Body mass index is 31.28 kg/m.  Physical Exam:   Physical Exam  Constitutional: She is oriented to person, place, and time. She appears well-developed and well-nourished. No distress.  HENT:  Head: Normocephalic and atraumatic.  Right Ear: External ear normal.  Left Ear: External ear normal.  Nose: Nose normal.  Mouth/Throat: Oropharynx is clear and moist.  Eyes: Pupils are equal, round, and reactive to light. Conjunctivae and EOM are normal.  Neck: Normal range of motion. Neck supple. No thyromegaly present.  Cardiovascular: Normal rate, regular rhythm, normal heart sounds and intact distal pulses.  Pulmonary/Chest: Effort normal and breath sounds normal.  Abdominal: Soft. Bowel sounds are normal.  Musculoskeletal: Normal range of motion.  Lymphadenopathy:    She has no cervical adenopathy.  Neurological: She is alert and oriented to person, place, and time.  Skin: Skin is warm and dry. Capillary refill takes less than 2 seconds.  Psychiatric: She has a normal mood and affect. Her behavior is normal.  Nursing note and vitals reviewed.  Results for orders placed or performed in visit on 03/24/18  POCT HgB A1C  Result Value Ref Range   Hemoglobin A1C 5.7 (A) 4.0 - 5.6 %   HbA1c, POC (prediabetic range)  5.7 - 6.4 %   HbA1c, POC (controlled diabetic range)  0.0 - 7.0 %   Assessment and Plan:   [name] was seen today for follow-up.  Diagnoses and all orders for this visit:  Mixed  hyperlipidemia -     Comprehensive metabolic panel  Obesity (BMI 30-39.9)  Avascular necrosis (H[facility])  Essential (primary) hypertension  Hepatic steatosis  Chronic maxillary sinusitis -     fluticasone (FLONASE) 50 MCG/ACT nasal spray; Place 1 spray into both nostrils daily. -     montelukast (SINGULAIR) 10 MG tablet; Take 1 tablet (10 mg total) by mouth at bedtime. -     CBC with Differential/Platelet  Essential hypertension -     hydrochlorothiazide (HYDRODIURIL) 25 MG tablet; Take 1 tablet (25 mg total) by mouth daily.  Recurrent major depressive disorder, in full remission ([place]) -     FLUoxetine (PROZAC) 20 MG tablet; Take 1 tablet (20 mg total) by mouth daily.  Encounter for screening for HIV -     HIV antibody  Screening for breast cancer -     MM 3D SCREEN BREAST BILATERAL; Future  Insulin resistance  Hyperglycemia -  POCT HgB A1C  Need for pneumococcal vaccine -     Pneumococcal conjugate vaccine 13-valent  Medication management -     EKG 12-Lead  Female cystocele -     Urinalysis, Routine w reflex microscopic  Statin intolerance  Other orders -     lisinopril (PRINIVIL,ZESTRIL) 30 MG tablet; Take 1 tablet (30 mg total) by mouth daily. -     Tdap (BOOSTRIX) 5-2.5-18.5 LF-MCG/0.5 injection; Inject 0.5 mLs into the muscle once for 1 dose.    . Reviewed expectations re: course of current medical issues. . Discussed self-management of symptoms. . Outlined signs and symptoms indicating need for more acute intervention. . Patient verbalized understanding and all questions were answered. Marland Kitchen Health Maintenance issues including appropriate healthy diet, exercise, and smoking avoidance were discussed with patient. . See orders for this visit as documented in the electronic medical record. . Patient received an After Visit Summary.  Briscoe Deutscher, DO [place], [facility] [place] 03/24/2018  Future Appointments  Date Time Provider [place]  03/24/2018   1:30 PM Mcarthur Rossetti, MD [facility] None  06/24/2018  4:20 PM Briscoe Deutscher, DO [facility]   CMA served as scribe during this visit. History, Physical, and Plan performed by medical provider. The above documentation has been reviewed and is accurate and complete. Briscoe Deutscher, D.O.

## 2018-03-24 ENCOUNTER — Ambulatory Visit (INDEPENDENT_AMBULATORY_CARE_PROVIDER_SITE_OTHER): Payer: Medicare Other | Admitting: Orthopaedic Surgery

## 2018-03-24 ENCOUNTER — Encounter (INDEPENDENT_AMBULATORY_CARE_PROVIDER_SITE_OTHER): Payer: Self-pay | Admitting: Orthopaedic Surgery

## 2018-03-24 ENCOUNTER — Encounter: Payer: Self-pay | Admitting: Family Medicine

## 2018-03-24 ENCOUNTER — Ambulatory Visit (INDEPENDENT_AMBULATORY_CARE_PROVIDER_SITE_OTHER): Payer: Medicare Other | Admitting: Family Medicine

## 2018-03-24 VITALS — BP 120/72 | HR 78 | Temp 98.7°F | Ht 63.0 in | Wt 176.6 lb

## 2018-03-24 DIAGNOSIS — Z1231 Encounter for screening mammogram for malignant neoplasm of breast: Secondary | ICD-10-CM | POA: Diagnosis not present

## 2018-03-24 DIAGNOSIS — M87051 Idiopathic aseptic necrosis of right femur: Secondary | ICD-10-CM | POA: Diagnosis not present

## 2018-03-24 DIAGNOSIS — Z114 Encounter for screening for human immunodeficiency virus [HIV]: Secondary | ICD-10-CM

## 2018-03-24 DIAGNOSIS — N8110 Cystocele, unspecified: Secondary | ICD-10-CM

## 2018-03-24 DIAGNOSIS — Z79899 Other long term (current) drug therapy: Secondary | ICD-10-CM

## 2018-03-24 DIAGNOSIS — I10 Essential (primary) hypertension: Secondary | ICD-10-CM

## 2018-03-24 DIAGNOSIS — E782 Mixed hyperlipidemia: Secondary | ICD-10-CM

## 2018-03-24 DIAGNOSIS — J320 Chronic maxillary sinusitis: Secondary | ICD-10-CM | POA: Diagnosis not present

## 2018-03-24 DIAGNOSIS — M8700 Idiopathic aseptic necrosis of unspecified bone: Secondary | ICD-10-CM | POA: Diagnosis not present

## 2018-03-24 DIAGNOSIS — E8881 Metabolic syndrome: Secondary | ICD-10-CM

## 2018-03-24 DIAGNOSIS — Z789 Other specified health status: Secondary | ICD-10-CM

## 2018-03-24 DIAGNOSIS — K760 Fatty (change of) liver, not elsewhere classified: Secondary | ICD-10-CM

## 2018-03-24 DIAGNOSIS — F3342 Major depressive disorder, recurrent, in full remission: Secondary | ICD-10-CM

## 2018-03-24 DIAGNOSIS — Z1239 Encounter for other screening for malignant neoplasm of breast: Secondary | ICD-10-CM

## 2018-03-24 DIAGNOSIS — Z23 Encounter for immunization: Secondary | ICD-10-CM

## 2018-03-24 DIAGNOSIS — E88819 Insulin resistance, unspecified: Secondary | ICD-10-CM

## 2018-03-24 DIAGNOSIS — E669 Obesity, unspecified: Secondary | ICD-10-CM | POA: Diagnosis not present

## 2018-03-24 HISTORY — DX: Idiopathic aseptic necrosis of right femur: M87.051

## 2018-03-24 LAB — URINALYSIS, ROUTINE W REFLEX MICROSCOPIC
Bilirubin Urine: NEGATIVE
Hgb urine dipstick: NEGATIVE
Ketones, ur: NEGATIVE
Leukocytes, UA: NEGATIVE
Nitrite: NEGATIVE
Specific Gravity, Urine: <=1.005 — AB (ref 1.000–1.030)
Total Protein, Urine: NEGATIVE
Urine Glucose: NEGATIVE
Urobilinogen, UA: 0.2 (ref 0.0–1.0)
pH: 7 (ref 5.0–8.0)

## 2018-03-24 LAB — CBC WITH DIFFERENTIAL/PLATELET
Basophils Absolute: 0 10*3/uL (ref 0.0–0.1)
Basophils Relative: 0.7 % (ref 0.0–3.0)
Eosinophils Absolute: 0.1 10*3/uL (ref 0.0–0.7)
Eosinophils Relative: 2.5 % (ref 0.0–5.0)
HCT: 44.9 % (ref 36.0–46.0)
Hemoglobin: 15.3 g/dL — ABNORMAL HIGH (ref 12.0–15.0)
Lymphocytes Relative: 32.9 % (ref 12.0–46.0)
Lymphs Abs: 1.9 10*3/uL (ref 0.7–4.0)
MCHC: 34 g/dL (ref 30.0–36.0)
MCV: 90.2 fl (ref 78.0–100.0)
Monocytes Absolute: 0.5 10*3/uL (ref 0.1–1.0)
Monocytes Relative: 8.8 % (ref 3.0–12.0)
Neutro Abs: 3.2 10*3/uL (ref 1.4–7.7)
Neutrophils Relative %: 55.1 % (ref 43.0–77.0)
Platelets: 271 10*3/uL (ref 150.0–400.0)
RBC: 4.98 Mil/uL (ref 3.87–5.11)
RDW: 14.1 % (ref 11.5–15.5)
WBC: 5.8 10*3/uL (ref 4.0–10.5)

## 2018-03-24 LAB — COMPREHENSIVE METABOLIC PANEL
ALT: 65 U/L — ABNORMAL HIGH (ref 0–35)
AST: 44 U/L — ABNORMAL HIGH (ref 0–37)
Albumin: 4.4 g/dL (ref 3.5–5.2)
Alkaline Phosphatase: 61 U/L (ref 39–117)
BUN: 14 mg/dL (ref 6–23)
CO2: 31 mEq/L (ref 19–32)
Calcium: 10 mg/dL (ref 8.4–10.5)
Chloride: 99 mEq/L (ref 96–112)
Creatinine, Ser: 0.92 mg/dL (ref 0.40–1.20)
GFR: 65 mL/min (ref >60.00)
Glucose, Bld: 99 mg/dL (ref 70–99)
Potassium: 4 mEq/L (ref 3.5–5.1)
Sodium: 140 mEq/L (ref 135–145)
Total Bilirubin: 0.6 mg/dL (ref 0.2–1.2)
Total Protein: 7 g/dL (ref 6.0–8.3)

## 2018-03-24 LAB — POCT GLYCOSYLATED HEMOGLOBIN (HGB A1C): Hemoglobin A1C: 5.7 % — AB (ref 4.0–5.6)

## 2018-03-24 MED ORDER — FLUTICASONE PROPIONATE 50 MCG/ACT NA SUSP: 1.0000 | Freq: Every day | NASAL | 3 refills | Status: DC

## 2018-03-24 MED ORDER — HYDROCHLOROTHIAZIDE 25 MG PO TABS: 25.0000 mg | ORAL_TABLET | Freq: Every day | ORAL | 3 refills | Status: DC

## 2018-03-24 MED ORDER — MONTELUKAST SODIUM 10 MG PO TABS: 10.0000 mg | ORAL_TABLET | Freq: Every day | ORAL | 3 refills | Status: DC

## 2018-03-24 MED ORDER — LISINOPRIL 30 MG PO TABS: 30.0000 mg | ORAL_TABLET | Freq: Every day | ORAL | 1 refills | Status: DC

## 2018-03-24 MED ORDER — FLUOXETINE HCL 20 MG PO TABS: 20.0000 mg | ORAL_TABLET | Freq: Every day | ORAL | 1 refills | Status: DC

## 2018-03-24 MED ORDER — TETANUS-DIPHTH-ACELL PERTUSSIS 5-2.5-18.5 LF-MCG/0.5 IM SUSP: 0.5000 mL | Freq: Once | INTRAMUSCULAR | 0 refills | Status: AC

## 2018-03-24 NOTE — Progress Notes (Signed)
Office Visit Note   Patient: Margaret White           Date of Birth: [birth year]           MRN: [id number] Visit Date: 03/24/2018              Requested by: Briscoe Deutscher, [address], [place] [address] PCP: Briscoe Deutscher, DO   Assessment & Plan: Visit Diagnoses:  1. Avascular necrosis of hip, right (HCC)     Plan: Given the fact that her plain films and MRI of the right hip show impending femoral head collapse and some collapse already due to her avascular necrosis we are recommending a right hip replacement.  This would be done through direct anterior approach.  I showed her hip model and went over her x-rays and MRI with her in detail.  I talked about the surgery and what the risk and benefits are of the surgery.  We talked about her intraoperative and postoperative course.  All questions and concerns were answered and addressed.  I do feel that she needs to have this done in the near future given the findings while receiving on her MRI and plain film as well as her clinical exam.  Follow-Up Instructions: Return for 2 weeks post-op.   Orders:  No orders of the defined types were placed in this encounter.  No orders of the defined types were placed in this encounter.     Procedures: No procedures performed   Clinical Data: No additional findings.   Subjective: Chief Complaint  Patient presents with  . Right Hip - Pain  Patient is a very pleasant 67 year old female who had an acute injury to her hip last year.  She is only been hurting for several months now but is gotten progressively worse since last fall.  She is now ambulating with a walker.  She has plain films and an MRI that accompanied her and they show end-stage avascular necrosis of the femoral head with femoral head collapse.  The pain is daily and at this point it is detrimentally affecting x-rays daily living, quality of life, mobility.  The walker does help take pressure off of her hip.  She  tried a cane at first.  Again this is from an acute injury originally.  She had no previous hip problems before this.  She does wish to get back to have a very active lifestyle she had prior to this injury.  HPI  Review of Systems She currently denies any headache, chest pain, shortness of breath, fever, chills, nausea, vomiting.  Objective: Vital Signs: There were no vitals taken for this visit.  Physical Exam She is alert and oriented x3 in no acute distress Ortho Exam Examination of the right hip shows full range of motion but severe pain with the extremes of rotation. Specialty Comments:  No specialty comments available.  Imaging: No results found. Plain films and MRI of the right hip independently reviewed show evidence of avascular necrosis of the right hip.  There is subchondral fracturing and collapse of the femoral head on the right side.  PMFS History: Patient Active Problem List   Diagnosis Date Noted  . Avascular necrosis ([place]) 03/24/2018  . Avascular necrosis of hip, right ([place]) 03/24/2018  . 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 GERD (gastroesophageal reflux disease) 10/14/2017  . Postmenopausal 10/14/2017  . Allergic rhinitis 10/14/2017  .  Abnormal LFTs 10/14/2017  . Hepatic steatosis 10/14/2017  . OSA (obstructive sleep apnea) 10/14/2017  . RLS (restless legs syndrome) 10/14/2017  . History of cluster headache 10/14/2017  . IBS (irritable bowel syndrome) 10/14/2017  . Fibromyalgia 10/14/2017  . Chronic maxillary sinusitis 10/14/2017  . Pelvic pain 10/14/2017  . Obesity (BMI 30-39.9) 10/14/2017  . Mixed hyperlipidemia 10/14/2017  . Vitamin B12 deficiency 10/14/2017  . Vitamin D deficiency 10/14/2017  . Female cystocele 10/14/2017  . Primary osteoarthritis of both hands 07/10/2017  . Essential (primary) hypertension 06/26/2017  . Recurrent major  depressive disorder, in full remission ([place]) 06/26/2017   Past Medical History:  Diagnosis Date  . Abnormal LFTs 10/14/2017  . Allergic rhinitis 10/14/2017  . Essential (primary) hypertension 06/26/2017  . Fibromyalgia 10/14/2017  . GERD (gastroesophageal reflux disease) 10/14/2017  . Hepatic steatosis 10/14/2017  . History of cluster headache 10/14/2017  . IBS (irritable bowel syndrome) 10/14/2017  . OSA (obstructive sleep apnea) 10/14/2017  . Postmenopausal 10/14/2017  . Primary osteoarthritis of both hands 07/10/2017  . Recurrent major depressive disorder, in full remission ([place]) 06/26/2017  . RLS (restless legs syndrome) 10/14/2017    Family History  Problem Relation Age of Onset  . Hypertension Mother   . Cancer Father   . Heart disease Father     Past Surgical History:  Procedure Laterality Date  . BREAST BIOPSY Left    approximately 10 years ago/ benign    . LAPAROSCOPIC TOTAL HYSTERECTOMY    . RHINOPLASTY     Social History   Occupational History  . Occupation: Product manager: [employer]  Tobacco Use  . Smoking status: Never Smoker  . Smokeless tobacco: Never Used  Substance and Sexual Activity  . Alcohol use: No    Frequency: Never  . Drug use: No  . Sexual activity: Not on file

## 2018-03-25 ENCOUNTER — Telehealth: Payer: Self-pay | Admitting: Family Medicine

## 2018-03-25 ENCOUNTER — Encounter: Payer: Self-pay | Admitting: Family Medicine

## 2018-03-25 DIAGNOSIS — D229 Melanocytic nevi, unspecified: Secondary | ICD-10-CM | POA: Diagnosis not present

## 2018-03-25 DIAGNOSIS — L821 Other seborrheic keratosis: Secondary | ICD-10-CM | POA: Diagnosis not present

## 2018-03-25 DIAGNOSIS — E8881 Metabolic syndrome: Secondary | ICD-10-CM | POA: Insufficient documentation

## 2018-03-25 DIAGNOSIS — L719 Rosacea, unspecified: Secondary | ICD-10-CM | POA: Diagnosis not present

## 2018-03-25 DIAGNOSIS — Z789 Other specified health status: Secondary | ICD-10-CM | POA: Insufficient documentation

## 2018-03-25 LAB — HIV ANTIBODY (ROUTINE TESTING W REFLEX): HIV 1&2 Ab, 4th Generation: NONREACTIVE

## 2018-03-25 NOTE — Telephone Encounter (Signed)
Copied from [place] [id number]. Topic: General - Other >> Mar 25, 2018  3:44 PM Cecelia Byars, NT wrote: Reason for CRM: Patient called and said Sheria Lang waited to know if she would come by to get  a EKG done at the practice , she is able to have it done tomorrow she has an appointment in [place] and will stop  by please call her if this at [phone] if it is not ok. She said she will be there  if she does not hear from the practice ,

## 2018-03-25 NOTE — Telephone Encounter (Signed)
Called patient app made for tomorrow.

## 2018-03-25 NOTE — Telephone Encounter (Signed)
See note

## 2018-03-26 ENCOUNTER — Encounter: Payer: Self-pay | Admitting: Surgical

## 2018-03-26 ENCOUNTER — Ambulatory Visit (INDEPENDENT_AMBULATORY_CARE_PROVIDER_SITE_OTHER): Payer: Medicare Other | Admitting: Surgical

## 2018-03-26 DIAGNOSIS — Z01818 Encounter for other preprocedural examination: Secondary | ICD-10-CM

## 2018-03-26 DIAGNOSIS — N3946 Mixed incontinence: Secondary | ICD-10-CM | POA: Diagnosis not present

## 2018-03-26 DIAGNOSIS — R351 Nocturia: Secondary | ICD-10-CM | POA: Diagnosis not present

## 2018-03-26 DIAGNOSIS — N3942 Incontinence without sensory awareness: Secondary | ICD-10-CM | POA: Diagnosis not present

## 2018-03-26 DIAGNOSIS — R350 Frequency of micturition: Secondary | ICD-10-CM | POA: Diagnosis not present

## 2018-03-26 NOTE — Progress Notes (Signed)
Patient comes in today for an EKG. She is having surgery and needed to have the EKG done. Dr. Juleen China reviewed EKG and said that is was fine.

## 2018-03-29 ENCOUNTER — Other Ambulatory Visit (INDEPENDENT_AMBULATORY_CARE_PROVIDER_SITE_OTHER): Payer: Self-pay | Admitting: Physician Assistant

## 2018-03-31 DIAGNOSIS — J069 Acute upper respiratory infection, unspecified: Secondary | ICD-10-CM | POA: Diagnosis not present

## 2018-03-31 NOTE — Patient Instructions (Signed)
Margaret White  03/31/2018   Your procedure is scheduled on: 04-11-18  Report to [facility] Main  Entrance  Report to admitting at      [place] AM    Call this number if you have problems the morning of surgery [phone]   Remember: Do not eat food or drink liquids :After Midnight.     Take these medicines the morning of surgery with A SIP OF WATER: prozac                                You may not have any metal on your body including hair pins and              piercings  Do not wear jewelry, make-up, lotions, powders or perfumes, deodorant             Do not wear nail polish.  Do not shave  48 hours prior to surgery.              Do not bring valuables to the hospital. [place].  Contacts, dentures or bridgework may not be worn into surgery.  Leave suitcase in the car. After surgery it may be brought to your room.                Please read over the following fact sheets you were given: _____________________________________________________________________          [facility] - Preparing for Surgery Before surgery, you can play an important role.  Because skin is not sterile, your skin needs to be as free of germs as possible.  You can reduce the number of germs on your skin by washing with CHG (chlorahexidine gluconate) soap before surgery.  CHG is an antiseptic cleaner which kills germs and bonds with the skin to continue killing germs even after washing. Please DO NOT use if you have an allergy to CHG or antibacterial soaps.  If your skin becomes reddened/irritated stop using the CHG and inform your nurse when you arrive at Short Stay. Do not shave (including legs and underarms) for at least 48 hours prior to the first CHG shower.  You may shave your face/neck. Please follow these instructions carefully:  1.  Shower with CHG Soap the night before surgery and the  morning of Surgery.  2.  If you choose to  wash your hair, wash your hair first as usual with your  normal  shampoo.  3.  After you shampoo, rinse your hair and body thoroughly to remove the  shampoo.                           4.  Use CHG as you would any other liquid soap.  You can apply chg directly  to the skin and wash                       Gently with a scrungie or clean washcloth.  5.  Apply the CHG Soap to your body ONLY FROM THE NECK DOWN.   Do not use on face/ open  Wound or open sores. Avoid contact with eyes, ears mouth and genitals (private parts).                       Wash face,  Genitals (private parts) with your normal soap.             6.  Wash thoroughly, paying special attention to the area where your surgery  will be performed.  7.  Thoroughly rinse your body with warm water from the neck down.  8.  DO NOT shower/wash with your normal soap after using and rinsing off  the CHG Soap.                9.  Pat yourself dry with a clean towel.            10.  Wear clean pajamas.            11.  Place clean sheets on your bed the night of your first shower and do not  sleep with pets. Day of Surgery : Do not apply any lotions/deodorants the morning of surgery.  Please wear clean clothes to the hospital/surgery center.  FAILURE TO FOLLOW THESE INSTRUCTIONS MAY RESULT IN THE CANCELLATION OF YOUR SURGERY PATIENT SIGNATURE_________________________________  NURSE SIGNATURE__________________________________  ________________________________________________________________________  WHAT IS A BLOOD TRANSFUSION? Blood Transfusion Information  A transfusion is the replacement of blood or some of its parts. Blood is made up of multiple cells which provide different functions.  Red blood cells carry oxygen and are used for blood loss replacement.  White blood cells fight against infection.  Platelets control bleeding.  Plasma helps clot blood.  Other blood products are available for specialized  needs, such as hemophilia or other clotting disorders. BEFORE THE TRANSFUSION  Who gives blood for transfusions?   Healthy volunteers who are fully evaluated to make sure their blood is safe. This is blood bank blood. Transfusion therapy is the safest it has ever been in the practice of medicine. Before blood is taken from a donor, a complete history is taken to make sure that person has no history of diseases nor engages in risky social behavior (examples are intravenous drug use or sexual activity with multiple partners). The donor's travel history is screened to minimize risk of transmitting infections, such as malaria. The donated blood is tested for signs of infectious diseases, such as HIV and hepatitis. The blood is then tested to be sure it is compatible with you in order to minimize the chance of a transfusion reaction. If you or a relative donates blood, this is often done in anticipation of surgery and is not appropriate for emergency situations. It takes many days to process the donated blood. RISKS AND COMPLICATIONS Although transfusion therapy is very safe and saves many lives, the main dangers of transfusion include:   Getting an infectious disease.  Developing a transfusion reaction. This is an allergic reaction to something in the blood you were given. Every precaution is taken to prevent this. The decision to have a blood transfusion has been considered carefully by your caregiver before blood is given. Blood is not given unless the benefits outweigh the risks. AFTER THE TRANSFUSION  Right after receiving a blood transfusion, you will usually feel much better and more energetic. This is especially true if your red blood cells have gotten low (anemic). The transfusion raises the level of the red blood cells which carry oxygen, and this usually causes an energy increase.  The  nurse administering the transfusion will monitor you carefully for complications. HOME CARE INSTRUCTIONS   No special instructions are needed after a transfusion. You may find your energy is better. Speak with your caregiver about any limitations on activity for underlying diseases you may have. SEEK MEDICAL CARE IF:   Your condition is not improving after your transfusion.  You develop redness or irritation at the intravenous (IV) site. SEEK IMMEDIATE MEDICAL CARE IF:  Any of the following symptoms occur over the next 12 hours:  Shaking chills.  You have a temperature by mouth above 102 F (38.9 C), not controlled by medicine.  Chest, back, or muscle pain.  People around you feel you are not acting correctly or are confused.  Shortness of breath or difficulty breathing.  Dizziness and fainting.  You get a rash or develop hives.  You have a decrease in urine output.  Your urine turns a dark color or changes to pink, red, or brown. Any of the following symptoms occur over the next 10 days:  You have a temperature by mouth above 102 F (38.9 C), not controlled by medicine.  Shortness of breath.  Weakness after normal activity.  The white part of the eye turns yellow (jaundice).  You have a decrease in the amount of urine or are urinating less often.  Your urine turns a dark color or changes to pink, red, or brown. Document Released: 09/21/2000 Document Revised: 12/17/2011 Document Reviewed: 05/10/2008 ExitCare Patient Information 2014 [place].  _______________________________________________________________________  Incentive Spirometer  An incentive spirometer is a tool that can help keep your lungs clear and active. This tool measures how well you are filling your lungs with each breath. Taking long deep breaths may help reverse or decrease the chance of developing breathing (pulmonary) problems (especially infection) following:  A long period of time when you are unable to move or be active. BEFORE THE PROCEDURE   If the spirometer includes an indicator to  show your best effort, your nurse or respiratory therapist will set it to a desired goal.  If possible, sit up straight or lean slightly forward. Try not to slouch.  Hold the incentive spirometer in an upright position. INSTRUCTIONS FOR USE  1. Sit on the edge of your bed if possible, or sit up as far as you can in bed or on a chair. 2. Hold the incentive spirometer in an upright position. 3. Breathe out normally. 4. Place the mouthpiece in your mouth and seal your lips tightly around it. 5. Breathe in slowly and as deeply as possible, raising the piston or the ball toward the top of the column. 6. Hold your breath for 3-5 seconds or for as long as possible. Allow the piston or ball to fall to the bottom of the column. 7. Remove the mouthpiece from your mouth and breathe out normally. 8. Rest for a few seconds and repeat Steps 1 through 7 at least 10 times every 1-2 hours when you are awake. Take your time and take a few normal breaths between deep breaths. 9. The spirometer may include an indicator to show your best effort. Use the indicator as a goal to work toward during each repetition. 10. After each set of 10 deep breaths, practice coughing to be sure your lungs are clear. If you have an incision (the cut made at the time of surgery), support your incision when coughing by placing a pillow or rolled up towels firmly against it. Once you are able to get  out of bed, walk around indoors and cough well. You may stop using the incentive spirometer when instructed by your caregiver.  RISKS AND COMPLICATIONS  Take your time so you do not get dizzy or light-headed.  If you are in pain, you may need to take or ask for pain medication before doing incentive spirometry. It is harder to take a deep breath if you are having pain. AFTER USE  Rest and breathe slowly and easily.  It can be helpful to keep track of a log of your progress. Your caregiver can provide you with a simple table to help with  this. If you are using the spirometer at home, follow these instructions: [place] IF:   You are having difficultly using the spirometer.  You have trouble using the spirometer as often as instructed.  Your pain medication is not giving enough relief while using the spirometer.  You develop fever of 100.5 F (38.1 C) or higher. SEEK IMMEDIATE MEDICAL CARE IF:   You cough up bloody sputum that had not been present before.  You develop fever of 102 F (38.9 C) or greater.  You develop worsening pain at or near the incision site. MAKE SURE YOU:   Understand these instructions.  Will watch your condition.  Will get help right away if you are not doing well or get worse. Document Released: 02/04/2007 Document Revised: 12/17/2011 Document Reviewed: 04/07/2007 [facility] Patient Information 2014 [place], [place].   ________________________________________________________________________

## 2018-03-31 NOTE — Progress Notes (Signed)
ekg 03-26-18 epic  Labs 03-24-18 cbc, cmp [id number] all in epic

## 2018-04-01 ENCOUNTER — Encounter (HOSPITAL_COMMUNITY): Payer: Self-pay

## 2018-04-01 ENCOUNTER — Encounter (HOSPITAL_COMMUNITY)
Admission: RE | Admit: 2018-04-01 | Discharge: 2018-04-01 | Disposition: A | Discharge status: Home / Self Care | Payer: Medicare Other | Source: Ambulatory Visit | Attending: Orthopaedic Surgery | Admitting: Orthopaedic Surgery

## 2018-04-01 ENCOUNTER — Other Ambulatory Visit: Payer: Self-pay

## 2018-04-01 DIAGNOSIS — Z01812 Encounter for preprocedural laboratory examination: Secondary | ICD-10-CM | POA: Diagnosis not present

## 2018-04-01 DIAGNOSIS — M87851 Other osteonecrosis, right femur: Secondary | ICD-10-CM | POA: Insufficient documentation

## 2018-04-01 HISTORY — DX: Headache: R51

## 2018-04-01 HISTORY — DX: Anxiety disorder, unspecified: F41.9

## 2018-04-01 HISTORY — DX: Unspecified convulsions: R56.9

## 2018-04-01 HISTORY — DX: Pneumonia, unspecified organism: J18.9

## 2018-04-01 LAB — SURGICAL PCR SCREEN
MRSA, PCR: NEGATIVE
STAPHYLOCOCCUS AUREUS: NEGATIVE

## 2018-04-01 LAB — ABO/RH: ABO/RH(D): A POS

## 2018-04-04 DIAGNOSIS — N3942 Incontinence without sensory awareness: Secondary | ICD-10-CM | POA: Diagnosis not present

## 2018-04-04 DIAGNOSIS — N3946 Mixed incontinence: Secondary | ICD-10-CM | POA: Diagnosis not present

## 2018-04-10 MED ORDER — TRANEXAMIC ACID 1000 MG/10ML IV SOLN
1000.0000 mg | INTRAVENOUS | Status: DC
  Filled 2018-04-10: qty 10

## 2018-04-10 MED ORDER — SODIUM CHLORIDE 0.9 % IV SOLN
1000.0000 mg | INTRAVENOUS | Status: AC
  Administered 2018-04-11: 1000 mg via INTRAVENOUS
  Filled 2018-04-10: qty 1100

## 2018-04-10 NOTE — Anesthesia Preprocedure Evaluation (Addendum)
Anesthesia Evaluation  Patient identified by MRN, date of birth, ID band Patient awake    Reviewed: Allergy & Precautions, NPO status , Patient's Chart, lab work & pertinent test results  Airway Mallampati: II  TM Distance: >3 FB Neck ROM: Full    Dental   Pulmonary sleep apnea ,    breath sounds clear to auscultation       Cardiovascular hypertension, Pt. on medications  Rhythm:Regular Rate:Normal     Neuro/Psych  Neuromuscular disease    GI/Hepatic Neg liver ROS, GERD  ,  Endo/Other  negative endocrine ROS  Renal/GU negative Renal ROS     Musculoskeletal  (+) Arthritis , Fibromyalgia -  Abdominal   Peds  Hematology negative hematology ROS (+)   Anesthesia Other Findings   Reproductive/Obstetrics                            Lab Results  Component Value Date   WBC 5.8 03/24/2018   HGB 15.3 (H) 03/24/2018   HCT 44.9 03/24/2018   MCV 90.2 03/24/2018   PLT 271.0 03/24/2018   Lab Results  Component Value Date   CREATININE 0.92 03/24/2018   BUN 14 03/24/2018   NA 140 03/24/2018   K 4.0 03/24/2018   CL 99 03/24/2018   CO2 31 03/24/2018    Anesthesia Physical Anesthesia Plan  ASA: II  Anesthesia Plan: General   Post-op Pain Management:    Induction: Intravenous  PONV Risk Score and Plan: 3 and Ondansetron, Dexamethasone, Treatment may vary due to age or medical condition and Midazolam  Airway Management Planned: Oral ETT  Additional Equipment:   Intra-op Plan:   Post-operative Plan: Extubation in OR  Informed Consent: I have reviewed the patients History and Physical, chart, labs and discussed the procedure including the risks, benefits and alternatives for the proposed anesthesia with the patient or authorized representative who has indicated his/her understanding and acceptance.   Dental advisory given  Plan Discussed with: CRNA  Anesthesia Plan Comments:         Anesthesia Quick Evaluation

## 2018-04-11 ENCOUNTER — Encounter (HOSPITAL_COMMUNITY): Payer: Self-pay | Admitting: Registered Nurse

## 2018-04-11 ENCOUNTER — Other Ambulatory Visit: Payer: Self-pay

## 2018-04-11 ENCOUNTER — Inpatient Hospital Stay (HOSPITAL_COMMUNITY): Payer: Medicare Other

## 2018-04-11 ENCOUNTER — Inpatient Hospital Stay (HOSPITAL_COMMUNITY)
Admission: RE | Admit: 2018-04-11 | Discharge: 2018-04-12 | DRG: 470 | Disposition: A | Discharge status: Home Health | Payer: Medicare Other | Attending: Orthopaedic Surgery | Admitting: Orthopaedic Surgery

## 2018-04-11 ENCOUNTER — Inpatient Hospital Stay (HOSPITAL_COMMUNITY): Payer: Medicare Other | Admitting: Anesthesiology

## 2018-04-11 ENCOUNTER — Encounter (HOSPITAL_COMMUNITY)
Admission: RE | Disposition: A | Discharge status: Home Health | Payer: Self-pay | Source: Home / Self Care | Attending: Orthopaedic Surgery

## 2018-04-11 DIAGNOSIS — Z882 Allergy status to sulfonamides status: Secondary | ICD-10-CM

## 2018-04-11 DIAGNOSIS — Z96641 Presence of right artificial hip joint: Secondary | ICD-10-CM

## 2018-04-11 DIAGNOSIS — M797 Fibromyalgia: Secondary | ICD-10-CM | POA: Diagnosis present

## 2018-04-11 DIAGNOSIS — M879 Osteonecrosis, unspecified: Secondary | ICD-10-CM | POA: Diagnosis not present

## 2018-04-11 DIAGNOSIS — Z9181 History of falling: Secondary | ICD-10-CM

## 2018-04-11 DIAGNOSIS — Z90710 Acquired absence of both cervix and uterus: Secondary | ICD-10-CM | POA: Diagnosis not present

## 2018-04-11 DIAGNOSIS — I10 Essential (primary) hypertension: Secondary | ICD-10-CM | POA: Diagnosis present

## 2018-04-11 DIAGNOSIS — K219 Gastro-esophageal reflux disease without esophagitis: Secondary | ICD-10-CM | POA: Diagnosis not present

## 2018-04-11 DIAGNOSIS — G4733 Obstructive sleep apnea (adult) (pediatric): Secondary | ICD-10-CM | POA: Diagnosis present

## 2018-04-11 DIAGNOSIS — Z885 Allergy status to narcotic agent status: Secondary | ICD-10-CM

## 2018-04-11 DIAGNOSIS — Z8249 Family history of ischemic heart disease and other diseases of the circulatory system: Secondary | ICD-10-CM

## 2018-04-11 DIAGNOSIS — Z419 Encounter for procedure for purposes other than remedying health state, unspecified: Secondary | ICD-10-CM

## 2018-04-11 DIAGNOSIS — M87051 Idiopathic aseptic necrosis of right femur: Secondary | ICD-10-CM | POA: Diagnosis not present

## 2018-04-11 DIAGNOSIS — M87251 Osteonecrosis due to previous trauma, right femur: Principal | ICD-10-CM | POA: Diagnosis present

## 2018-04-11 DIAGNOSIS — Z471 Aftercare following joint replacement surgery: Secondary | ICD-10-CM | POA: Diagnosis not present

## 2018-04-11 HISTORY — PX: TOTAL HIP ARTHROPLASTY: SHX124

## 2018-04-11 LAB — TYPE AND SCREEN
ABO/RH(D): A POS
Antibody Screen: NEGATIVE

## 2018-04-11 SURGERY — ARTHROPLASTY, HIP, TOTAL, ANTERIOR APPROACH
Anesthesia: General | Site: Hip | Laterality: Right

## 2018-04-11 MED ORDER — HYDROCHLOROTHIAZIDE 25 MG PO TABS
25.0000 mg | ORAL_TABLET | Freq: Every day | ORAL | Status: DC
  Administered 2018-04-11 – 2018-04-12 (×2): 25 mg via ORAL
  Filled 2018-04-11 (×2): qty 1

## 2018-04-11 MED ORDER — POTASSIUM GLUCONATE 595 (99 K) MG PO TABS
595.0000 mg | ORAL_TABLET | Freq: Every day | ORAL | Status: DC
  Administered 2018-04-11: 595 mg via ORAL
  Filled 2018-04-11 (×3): qty 1

## 2018-04-11 MED ORDER — FENTANYL CITRATE (PF) 250 MCG/5ML IJ SOLN
INTRAMUSCULAR | Status: AC
  Filled 2018-04-11: qty 5

## 2018-04-11 MED ORDER — ONDANSETRON HCL 4 MG/2ML IJ SOLN
4.0000 mg | Freq: Four times a day (QID) | INTRAMUSCULAR | Status: DC | PRN
  Administered 2018-04-11: 4 mg via INTRAVENOUS
  Filled 2018-04-11: qty 2

## 2018-04-11 MED ORDER — DEXAMETHASONE SODIUM PHOSPHATE 10 MG/ML IJ SOLN
INTRAMUSCULAR | Status: DC | PRN
  Administered 2018-04-11: 10 mg via INTRAVENOUS

## 2018-04-11 MED ORDER — LIDOCAINE 2% (20 MG/ML) 5 ML SYRINGE
INTRAMUSCULAR | Status: AC
  Filled 2018-04-11: qty 5

## 2018-04-11 MED ORDER — MIDAZOLAM HCL 5 MG/5ML IJ SOLN
INTRAMUSCULAR | Status: DC | PRN
  Administered 2018-04-11: 2 mg via INTRAVENOUS

## 2018-04-11 MED ORDER — ONDANSETRON HCL 4 MG/2ML IJ SOLN
INTRAMUSCULAR | Status: AC
  Filled 2018-04-11: qty 4

## 2018-04-11 MED ORDER — MONTELUKAST SODIUM 10 MG PO TABS
10.0000 mg | ORAL_TABLET | Freq: Every day | ORAL | Status: DC
  Administered 2018-04-11: 10 mg via ORAL
  Filled 2018-04-11: qty 1

## 2018-04-11 MED ORDER — ALUM & MAG HYDROXIDE-SIMETH 200-200-20 MG/5ML PO SUSP: 30.0000 mL | ORAL | Status: DC | PRN

## 2018-04-11 MED ORDER — METOCLOPRAMIDE HCL 5 MG/ML IJ SOLN
5.0000 mg | Freq: Three times a day (TID) | INTRAMUSCULAR | Status: DC | PRN
  Administered 2018-04-11: 10 mg via INTRAVENOUS
  Filled 2018-04-11: qty 2

## 2018-04-11 MED ORDER — ROCURONIUM BROMIDE 100 MG/10ML IV SOLN
INTRAVENOUS | Status: DC | PRN
  Administered 2018-04-11: 60 mg via INTRAVENOUS

## 2018-04-11 MED ORDER — PROPOFOL 10 MG/ML IV BOLUS
INTRAVENOUS | Status: AC
  Filled 2018-04-11: qty 60

## 2018-04-11 MED ORDER — LIDOCAINE HCL (CARDIAC) PF 100 MG/5ML IV SOSY
PREFILLED_SYRINGE | INTRAVENOUS | Status: DC | PRN
  Administered 2018-04-11: 25 mg via INTRATRACHEAL
  Administered 2018-04-11: 75 mg via INTRATRACHEAL

## 2018-04-11 MED ORDER — METHOCARBAMOL 1000 MG/10ML IJ SOLN
500.0000 mg | Freq: Four times a day (QID) | INTRAVENOUS | Status: DC | PRN
  Administered 2018-04-11: 500 mg via INTRAVENOUS
  Filled 2018-04-11: qty 550

## 2018-04-11 MED ORDER — ONDANSETRON HCL 4 MG PO TABS: 4.0000 mg | ORAL_TABLET | Freq: Four times a day (QID) | ORAL | Status: DC | PRN

## 2018-04-11 MED ORDER — CEFAZOLIN SODIUM-DEXTROSE 1-4 GM/50ML-% IV SOLN
1.0000 g | Freq: Four times a day (QID) | INTRAVENOUS | Status: AC
  Administered 2018-04-11 (×2): 1 g via INTRAVENOUS
  Filled 2018-04-11 (×2): qty 50

## 2018-04-11 MED ORDER — CEFAZOLIN SODIUM-DEXTROSE 2-4 GM/100ML-% IV SOLN
INTRAVENOUS | Status: AC
  Filled 2018-04-11: qty 100

## 2018-04-11 MED ORDER — MENTHOL 3 MG MT LOZG: 1.0000 | LOZENGE | OROMUCOSAL | Status: DC | PRN

## 2018-04-11 MED ORDER — MIDAZOLAM HCL 2 MG/2ML IJ SOLN
INTRAMUSCULAR | Status: AC
  Filled 2018-04-11: qty 2

## 2018-04-11 MED ORDER — DEXAMETHASONE SODIUM PHOSPHATE 10 MG/ML IJ SOLN
INTRAMUSCULAR | Status: AC
  Filled 2018-04-11: qty 1

## 2018-04-11 MED ORDER — PHENYLEPHRINE 40 MCG/ML (10ML) SYRINGE FOR IV PUSH (FOR BLOOD PRESSURE SUPPORT)
PREFILLED_SYRINGE | INTRAVENOUS | Status: AC
  Filled 2018-04-11: qty 10

## 2018-04-11 MED ORDER — ACETAMINOPHEN 325 MG PO TABS: 325.0000 mg | ORAL_TABLET | Freq: Four times a day (QID) | ORAL | Status: DC | PRN

## 2018-04-11 MED ORDER — HYDROMORPHONE HCL 1 MG/ML IJ SOLN: 0.2500 mg | INTRAMUSCULAR | Status: DC | PRN

## 2018-04-11 MED ORDER — SUGAMMADEX SODIUM 500 MG/5ML IV SOLN
INTRAVENOUS | Status: DC | PRN
  Administered 2018-04-11: 300 mg via INTRAVENOUS

## 2018-04-11 MED ORDER — PHENYLEPHRINE HCL 10 MG/ML IJ SOLN
INTRAMUSCULAR | Status: DC | PRN
  Administered 2018-04-11 (×2): 80 ug via INTRAVENOUS

## 2018-04-11 MED ORDER — PROMETHAZINE HCL 25 MG/ML IJ SOLN: 6.2500 mg | INTRAMUSCULAR | Status: DC | PRN

## 2018-04-11 MED ORDER — CEFAZOLIN SODIUM-DEXTROSE 2-4 GM/100ML-% IV SOLN
2.0000 g | INTRAVENOUS | Status: AC
  Administered 2018-04-11: 2 g via INTRAVENOUS

## 2018-04-11 MED ORDER — PROPOFOL 10 MG/ML IV BOLUS
INTRAVENOUS | Status: DC | PRN
  Administered 2018-04-11: 40 mg via INTRAVENOUS
  Administered 2018-04-11: 150 mg via INTRAVENOUS

## 2018-04-11 MED ORDER — ONDANSETRON HCL 4 MG/2ML IJ SOLN
INTRAMUSCULAR | Status: DC | PRN
  Administered 2018-04-11: 4 mg via INTRAVENOUS

## 2018-04-11 MED ORDER — FLUOXETINE HCL 20 MG PO TABS
20.0000 mg | ORAL_TABLET | Freq: Every day | ORAL | Status: DC
  Filled 2018-04-11: qty 1

## 2018-04-11 MED ORDER — LACTATED RINGERS IV SOLN
INTRAVENOUS | Status: DC
  Administered 2018-04-11 (×3): via INTRAVENOUS

## 2018-04-11 MED ORDER — SODIUM CHLORIDE 0.9 % IV SOLN
INTRAVENOUS | Status: DC
  Administered 2018-04-11 – 2018-04-12 (×2): via INTRAVENOUS

## 2018-04-11 MED ORDER — MIRABEGRON ER 25 MG PO TB24
25.0000 mg | ORAL_TABLET | Freq: Every day | ORAL | Status: DC
  Administered 2018-04-11 – 2018-04-12 (×2): 25 mg via ORAL
  Filled 2018-04-11 (×2): qty 1

## 2018-04-11 MED ORDER — METOCLOPRAMIDE HCL 5 MG PO TABS: 5.0000 mg | ORAL_TABLET | Freq: Three times a day (TID) | ORAL | Status: DC | PRN

## 2018-04-11 MED ORDER — ROCURONIUM BROMIDE 100 MG/10ML IV SOLN
INTRAVENOUS | Status: AC
  Filled 2018-04-11: qty 1

## 2018-04-11 MED ORDER — FENTANYL CITRATE (PF) 100 MCG/2ML IJ SOLN
INTRAMUSCULAR | Status: AC
  Filled 2018-04-11: qty 2

## 2018-04-11 MED ORDER — HYDROMORPHONE HCL 1 MG/ML IJ SOLN
INTRAMUSCULAR | Status: DC | PRN
  Administered 2018-04-11: 1 mg via INTRAVENOUS

## 2018-04-11 MED ORDER — PHENOL 1.4 % MT LIQD: 1.0000 | OROMUCOSAL | Status: DC | PRN

## 2018-04-11 MED ORDER — METHOCARBAMOL 500 MG PO TABS
500.0000 mg | ORAL_TABLET | Freq: Four times a day (QID) | ORAL | Status: DC | PRN
  Administered 2018-04-12: 500 mg via ORAL
  Filled 2018-04-11: qty 1

## 2018-04-11 MED ORDER — FENTANYL CITRATE (PF) 100 MCG/2ML IJ SOLN
INTRAMUSCULAR | Status: DC | PRN
  Administered 2018-04-11: 100 ug via INTRAVENOUS
  Administered 2018-04-11: 50 ug via INTRAVENOUS
  Administered 2018-04-11: 100 ug via INTRAVENOUS
  Administered 2018-04-11 (×4): 50 ug via INTRAVENOUS

## 2018-04-11 MED ORDER — HYDROMORPHONE HCL 1 MG/ML IJ SOLN: 0.5000 mg | INTRAMUSCULAR | Status: DC | PRN

## 2018-04-11 MED ORDER — SODIUM CHLORIDE 0.9 % IR SOLN
Status: DC | PRN
  Administered 2018-04-11: 1000 mL

## 2018-04-11 MED ORDER — OXYCODONE HCL 5 MG PO TABS: 10.0000 mg | ORAL_TABLET | ORAL | Status: DC | PRN

## 2018-04-11 MED ORDER — POLYETHYLENE GLYCOL 3350 17 G PO PACK: 17.0000 g | PACK | Freq: Every day | ORAL | Status: DC | PRN

## 2018-04-11 MED ORDER — ASPIRIN 81 MG PO CHEW
81.0000 mg | CHEWABLE_TABLET | Freq: Two times a day (BID) | ORAL | Status: DC
  Administered 2018-04-11 – 2018-04-12 (×2): 81 mg via ORAL
  Filled 2018-04-11 (×2): qty 1

## 2018-04-11 MED ORDER — OXYCODONE HCL 5 MG PO TABS
5.0000 mg | ORAL_TABLET | ORAL | Status: DC | PRN
  Administered 2018-04-11 – 2018-04-12 (×3): 10 mg via ORAL
  Filled 2018-04-11 (×4): qty 2

## 2018-04-11 MED ORDER — HYDROMORPHONE HCL 2 MG/ML IJ SOLN
INTRAMUSCULAR | Status: AC
  Filled 2018-04-11: qty 1

## 2018-04-11 MED ORDER — LISINOPRIL 20 MG PO TABS
30.0000 mg | ORAL_TABLET | Freq: Every day | ORAL | Status: DC
  Filled 2018-04-11: qty 1

## 2018-04-11 MED ORDER — DOCUSATE SODIUM 100 MG PO CAPS
100.0000 mg | ORAL_CAPSULE | Freq: Two times a day (BID) | ORAL | Status: DC
  Administered 2018-04-11 – 2018-04-12 (×2): 100 mg via ORAL
  Filled 2018-04-11 (×2): qty 1

## 2018-04-11 MED ORDER — CHLORHEXIDINE GLUCONATE 4 % EX LIQD: 60.0000 mL | Freq: Once | CUTANEOUS | Status: DC

## 2018-04-11 MED ORDER — PANTOPRAZOLE SODIUM 40 MG PO TBEC
40.0000 mg | DELAYED_RELEASE_TABLET | Freq: Every day | ORAL | Status: DC
  Administered 2018-04-11 – 2018-04-12 (×2): 40 mg via ORAL
  Filled 2018-04-11 (×2): qty 1

## 2018-04-11 MED ORDER — DIPHENHYDRAMINE HCL 12.5 MG/5ML PO ELIX
12.5000 mg | ORAL_SOLUTION | ORAL | Status: DC | PRN
  Administered 2018-04-11 – 2018-04-12 (×3): 25 mg via ORAL
  Filled 2018-04-11 (×3): qty 10

## 2018-04-11 SURGICAL SUPPLY — 32 items
BAG ZIPLOCK 12X15 (MISCELLANEOUS) IMPLANT
BENZOIN TINCTURE PRP APPL 2/3 (GAUZE/BANDAGES/DRESSINGS) IMPLANT
BLADE SAW SGTL 18X1.27X75 (BLADE) ×2 IMPLANT
CAPT HIP TOTAL 2 ×2 IMPLANT
COVER PERINEAL POST (MISCELLANEOUS) ×2 IMPLANT
COVER SURGICAL LIGHT HANDLE (MISCELLANEOUS) ×2 IMPLANT
DRAPE STERI IOBAN 125X83 (DRAPES) ×2 IMPLANT
DRAPE U-SHAPE 47X51 STRL (DRAPES) ×6 IMPLANT
DRESSING AQUACEL AG SP 3.5X10 (GAUZE/BANDAGES/DRESSINGS) ×1 IMPLANT
DRSG AQUACEL AG SP 3.5X10 (GAUZE/BANDAGES/DRESSINGS) ×2
DURAPREP 26ML APPLICATOR (WOUND CARE) ×2 IMPLANT
ELECT REM PT RETURN 15FT ADLT (MISCELLANEOUS) ×2 IMPLANT
GAUZE XEROFORM 1X8 LF (GAUZE/BANDAGES/DRESSINGS) ×2 IMPLANT
GLOVE BIO SURGEON STRL SZ7.5 (GLOVE) ×2 IMPLANT
GLOVE BIOGEL PI IND STRL 8 (GLOVE) ×2 IMPLANT
GLOVE BIOGEL PI INDICATOR 8 (GLOVE) ×2
GLOVE ECLIPSE 8.0 STRL XLNG CF (GLOVE) ×2 IMPLANT
GOWN STRL REUS W/TWL XL LVL3 (GOWN DISPOSABLE) ×4 IMPLANT
HANDPIECE INTERPULSE COAX TIP (DISPOSABLE) ×1
HOLDER FOLEY CATH W/STRAP (MISCELLANEOUS) ×2 IMPLANT
PACK ANTERIOR HIP CUSTOM (KITS) ×2 IMPLANT
SET HNDPC FAN SPRY TIP SCT (DISPOSABLE) ×1 IMPLANT
STAPLER VISISTAT 35W (STAPLE) IMPLANT
STRIP CLOSURE SKIN 1/2X4 (GAUZE/BANDAGES/DRESSINGS) ×2 IMPLANT
SUT ETHIBOND NAB CT1 #1 30IN (SUTURE) ×2 IMPLANT
SUT MNCRL AB 4-0 PS2 18 (SUTURE) IMPLANT
SUT VIC AB 0 CT1 36 (SUTURE) ×2 IMPLANT
SUT VIC AB 1 CT1 36 (SUTURE) ×2 IMPLANT
SUT VIC AB 2-0 CT1 27 (SUTURE) ×2
SUT VIC AB 2-0 CT1 TAPERPNT 27 (SUTURE) ×2 IMPLANT
TRAY FOLEY MTR SLVR 16FR STAT (SET/KITS/TRAYS/PACK) ×2 IMPLANT
YANKAUER SUCT BULB TIP 10FT TU (MISCELLANEOUS) ×2 IMPLANT

## 2018-04-11 NOTE — Anesthesia Procedure Notes (Signed)
Procedure Name: Intubation Date/Time: 04/11/2018 8:44 AM Performed by: Lissa Morales, CRNA Pre-anesthesia Checklist: Patient identified, Emergency Drugs available, Suction available and Patient being monitored Patient Re-evaluated:Patient Re-evaluated prior to induction Oxygen Delivery Method: Circle system utilized Preoxygenation: Pre-oxygenation with 100% oxygen Induction Type: IV induction Ventilation: Mask ventilation without difficulty Laryngoscope Size: Mac and 4 Grade View: Grade II Tube type: Oral Tube size: 7.0 mm Number of attempts: 1 Airway Equipment and Method: Stylet and Oral airway Placement Confirmation: ETT inserted through vocal cords under direct vision,  positive ETCO2 and breath sounds checked- equal and bilateral Secured at: 21 cm Tube secured with: Tape Dental Injury: Teeth and Oropharynx as per pre-operative assessment

## 2018-04-11 NOTE — Op Note (Signed)
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  PROCEDURE PERFORMED:  Right total hip arthroplasty with a direct anterior approach.  IMPLANTS:  DePuy Sector Gription acetabular component size 50, size 32+0 polyethylene liner, size 11 Corail femoral component with standard offset, size 32+1 ceramic hip ball.  SURGEON:  Lind Guest. Ninfa Linden, MD  ASSISTANT:  Erskine Emery, PA-C.  ANESTHESIA:  General.  ANTIBIOTICS:  2 grams IV Ancef.  ESTIMATED BLOOD LOSS:  100 mL.  COMPLICATIONS:  None.  INDICATIONS:  The patient is a very pleasant 67 year old female who sustained a hard mechanical fall about a year ago.  She then developed avascular necrosis of her right hip.  This has been verified on plain films and especially MRI.  Her pain is daily  and is detrimentally affected her activities of daily living and her quality of life.  She has been having to use a walker to offload that hip and that has helped it feel a little bit better.  Based on the plain film and MRI findings, we are recommending  a total hip arthroplasty, direct anterior approach.  She does wish to proceed with this as well given the disabling nature of her right hip pain.  She understands fully the risk of acute blood loss anemia, nerve or vessel injury, fracture, infection,  dislocation, DVT.  She understands her goals are to decrease pain, improve mobility and overall improve quality of life.  DESCRIPTION OF PROCEDURE:  After informed consent was obtained, appropriate right hip was marked.  She was brought to the operating room where general anesthesia was  obtained while she was on her stretcher.  Foley catheter was placed and both feet had  traction boots applied to them.  Next, she was placed supine on the Hana fracture table with a perineal post in place and both legs in line skeletal traction device and no traction applied.  Her right operative hip was prepped and draped with DuraPrep  and sterile drapes.  Time-out was called and she was identified by correct patient and correct right hip.  I then made an incision just inferior and posterior to the anterior superior iliac spine and carried this obliquely down the leg.  We dissected  down tensor fascia lata muscle.  Tensor fascia was then divided longitudinally to proceed with direct anterior approach to the hip.  We identified and cauterized circumflex vessels and identified the hip capsule.  We entered the hip capsule in L-type  format finding very large joint effusion.  We placed Cobra retractors around the medial and lateral femoral neck and made our femoral neck cut with an oscillating saw and completed this with an osteotome.  We placed a corkscrew guide in the femoral head  and removed the femoral head in its entirety and found a wide area of avascular necrosis.  We then placed a bent Hohmann over the medial acetabular rim and removed remnants of the acetabular labrum and other debris.  We then began reaming under direct  visualization from a size 44 reamer in stepwise increments up to a size 49 with all reamers under direct visualization, the last reamer under direct fluoroscopy, so I can obtain my depth of reaming on inclination anteversion.  Once I was  pleased with  this, I placed the real DePuy Sector Gription acetabular component size 50 and a 32+0 neutral polyethylene liner for that size 50 acetabular component.  Attention was then turned to the femur.  With the leg externally rotated to 120 degrees, extended and  adducted, we were able to place a Mueller retractor medially and Homans retractor  above the greater trochanter.  We released the lateral joint capsule and used a box-cutting osteotome to enter femoral canal and rongeur to lateralize.  We then began  broaching using the Corail broaching system from a size 8 going up to a size 11.  With a size 11 in place, we trialed a standard offset femoral neck and 32+1 hip ball.  We brought the leg back over in upward traction, internal rotation, reducing the  pelvis and I was pleased with leg length, offset, range of motion and stability on exam.  We then dislocated the hip and removed the trial components.  I placed the real Corail femoral component size 11 with standard offset and the real 32+1 ceramic hip  ball and again reduced this in the acetabulum and I was pleased with stability.  I then irrigated the soft tissues with normal saline solution using pulsatile lavage.  I closed the joint capsule with interrupted #1 Ethibond suture, followed by running 0  Vicryl in tensor fascia, 0 Vicryl in the deep tissue, 2-0 Vicryl subcutaneous tissue, 4-0 Monocryl subcuticular stitch, Steri-Strips on the skin and Aquacel dressing was applied.  She was taken off the Hana table, awakened, extubated, and taken to  recovery room in stable condition.  All final counts were correct.  There were no complications noted.  Of note, Benita Stabile, PA-C, assisted in the entire case.  His assistance was crucial for facilitating all aspects of this case.  TN/NUANCE  D:04/11/2018 T:04/11/2018 JOB:[id number]

## 2018-04-11 NOTE — Transfer of Care (Signed)
Immediate Anesthesia Transfer of Care Note  Patient: Margaret White  Procedure(s) Performed: RIGHT TOTAL HIP ARTHROPLASTY ANTERIOR APPROACH (Right Hip)  Patient Location: PACU  Anesthesia Type:General  Level of Consciousness: awake, alert , oriented and patient cooperative  Airway & Oxygen Therapy: Patient Spontanous Breathing and Patient connected to face mask oxygen  Post-op Assessment: Report given to RN, Post -op Vital signs reviewed and stable and Patient moving all extremities X 4  Post vital signs: stable  Last Vitals:  Vitals Value Taken Time  BP 124/81 04/11/2018 10:21 AM  Temp    Pulse 77 04/11/2018 10:23 AM  Resp    SpO2 98 % 04/11/2018 10:23 AM  Vitals shown include unvalidated device data.  Last Pain:  Vitals:   04/11/18 0712  TempSrc:   PainSc: 0-No pain         Complications: No apparent anesthesia complications

## 2018-04-11 NOTE — Brief Op Note (Signed)
04/11/2018  9:51 AM  PATIENT:  Margaret White  67 y.o. female  PRE-OPERATIVE DIAGNOSIS:  avascular necrosis right hip  POST-OPERATIVE DIAGNOSIS:  avascular necrosis right hip  PROCEDURE:  Procedure(s): RIGHT TOTAL HIP ARTHROPLASTY ANTERIOR APPROACH (Right)  SURGEON:  Surgeon(s) and Role:    Mcarthur Rossetti, MD - Primary  PHYSICIAN ASSISTANT: Benita Stabile, PA-C  ANESTHESIA:   general  EBL:  100 mL   COUNTS:  YES  DICTATION: .Other Dictation: Dictation Number [id number]  PLAN OF CARE: Admit to inpatient   PATIENT DISPOSITION:  PACU - hemodynamically stable.   Delay start of Pharmacological VTE agent (>24hrs) due to surgical blood loss or risk of bleeding: no

## 2018-04-11 NOTE — Anesthesia Postprocedure Evaluation (Signed)
Anesthesia Post Note  Patient: Margaret White  Procedure(s) Performed: RIGHT TOTAL HIP ARTHROPLASTY ANTERIOR APPROACH (Right Hip)     Patient location during evaluation: PACU Anesthesia Type: General Level of consciousness: awake and alert Pain management: pain level controlled Vital Signs Assessment: post-procedure vital signs reviewed and stable Respiratory status: spontaneous breathing, nonlabored ventilation, respiratory function stable and patient connected to nasal cannula oxygen Cardiovascular status: blood pressure returned to baseline and stable Postop Assessment: no apparent nausea or vomiting Anesthetic complications: no    Last Vitals:  Vitals:   04/11/18 1045 04/11/18 1100  BP: 114/72 117/70  Pulse: 76 77  Resp: 11 12  Temp:  36.5 C  SpO2: 97% 97%    Last Pain:  Vitals:   04/11/18 1100  TempSrc:   PainSc: 2                  Tiajuana Amass

## 2018-04-11 NOTE — H&P (Signed)
TOTAL HIP ADMISSION H&P  Patient is admitted for right total hip arthroplasty.  Subjective:  Chief Complaint: right hip pain  HPI: Margaret White, 67 y.o. female, has a history of pain and functional disability in the right hip(s) due to avascular necrosis and patient has failed non-surgical conservative treatments for greater than 12 weeks to include NSAID's and/or analgesics, use of assistive devices and activity modification.  Onset of symptoms was abrupt starting 1 years ago with rapidlly worsening course since that time.The patient noted no past surgery on the right hip(s).  Patient currently rates pain in the right hip at 10 out of 10 with activity. Patient has night pain, worsening of pain with activity and weight bearing, pain that interfers with activities of daily living and pain with passive range of motion. Patient has evidence of subchondral cysts, joint space narrowing and flattening of the femoral head by imaging studies. This condition presents safety issues increasing the risk of falls.  There is no current active infection.  Patient Active Problem List   Diagnosis Date Noted  . Statin intolerance 03/25/2018  . 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 Avascular necrosis ([place]) 03/24/2018  . Avascular necrosis of hip, right ([place]) 03/24/2018  . 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 GERD (gastroesophageal reflux disease) 10/14/2017  . Postmenopausal 10/14/2017  . Allergic rhinitis 10/14/2017  . Abnormal LFTs 10/14/2017  . Hepatic steatosis 10/14/2017  . OSA (obstructive sleep apnea) 10/14/2017  . RLS (restless legs syndrome) 10/14/2017  . History of cluster headache 10/14/2017  . IBS (irritable bowel syndrome) 10/14/2017  . Fibromyalgia 10/14/2017  . Chronic maxillary sinusitis 10/14/2017  . Pelvic pain 10/14/2017  . Obesity (BMI 30-39.9) 10/14/2017  . Mixed  hyperlipidemia 10/14/2017  . Vitamin B12 deficiency 10/14/2017  . Vitamin D deficiency 10/14/2017  . Female cystocele 10/14/2017  . Primary osteoarthritis of both hands 07/10/2017  . Essential hypertension 06/26/2017  . Recurrent major depressive disorder, in full remission ([place]) 06/26/2017   Past Medical History:  Diagnosis Date  . Abnormal LFTs 10/14/2017  . Allergic rhinitis 10/14/2017  . Anxiety   . Essential (primary) hypertension 06/26/2017  . Fibromyalgia 10/14/2017  . GERD (gastroesophageal reflux disease) 10/14/2017  . Headache    cluster HA  . Hepatic steatosis 10/14/2017  . History of cluster headache 10/14/2017  . IBS (irritable bowel syndrome) 10/14/2017  . OSA (obstructive sleep apnea) 10/14/2017  . Pneumonia   . Postmenopausal 10/14/2017  . Primary osteoarthritis of both hands 07/10/2017  . Recurrent major depressive disorder, in full remission ([place]) 06/26/2017  . RLS (restless legs syndrome) 10/14/2017   not diagnosed  . Seizures ([place])    2006  x1    Past Surgical History:  Procedure Laterality Date  . atopic pregnancy    . BREAST BIOPSY Left    approximately 10 years ago/ benign    . CARPAL TUNNEL RELEASE     bil  . EYE SURGERY     as a child eyes went out, Bil cataracts 2019  . JOINT REPLACEMENT     Right total hip Dr. Zollie Beckers  . LAPAROSCOPIC TOTAL HYSTERECTOMY    . NASAL SEPTUM SURGERY    . prolapsed bladder    . RHINOPLASTY    . tummy tuck    . ulnar nerve relocation     left    Current Facility-Administered Medications  Medication Dose Route Frequency Provider Last Rate Last  Dose  . ceFAZolin (ANCEF) 2-4 GM/100ML-% IVPB           . ceFAZolin (ANCEF) IVPB 2g/100 mL premix  2 g Intravenous On Call to OR Pete Pelt, PA-C      . chlorhexidine (HIBICLENS) 4 % liquid 4 application  60 mL Topical Once Pete Pelt, PA-C      . lactated ringers infusion   Intravenous Continuous Suzette Battiest, MD      . tranexamic acid (CYKLOKAPRON) 1,000 mg  in sodium chloride 0.9 % 100 mL IVPB  1,000 mg Intravenous To OR Mcarthur Rossetti, MD       Allergies  Allergen Reactions  . Codeine Itching  . Sulfa Antibiotics Rash    Flu like symptoms    Social History   Tobacco Use  . Smoking status: Never Smoker  . Smokeless tobacco: Never Used  Substance Use Topics  . Alcohol use: No    Frequency: Never    Family History  Problem Relation Age of Onset  . Hypertension Mother   . Cancer Father   . Heart disease Father      Review of Systems  Musculoskeletal: Positive for joint pain.  All other systems reviewed and are negative.   Objective:  Physical Exam  Constitutional: She is oriented to person, place, and time. She appears well-developed and well-nourished.  HENT:  Head: Normocephalic and atraumatic.  Eyes: EOM are normal.  Neck: Normal range of motion. Neck supple.  Cardiovascular: Normal rate and regular rhythm.  Respiratory: Effort normal and breath sounds normal.  GI: Soft. Bowel sounds are normal.  Musculoskeletal:       Right hip: She exhibits decreased range of motion, decreased strength, tenderness and bony tenderness.  Neurological: She is alert and oriented to person, place, and time.  Skin: Skin is warm and dry.  Psychiatric: She has a normal mood and affect.    Vital signs in last 24 hours: Temp:  [98.7 F (37.1 C)] 98.7 F (37.1 C) (07/05 0658) Pulse Rate:  [86] 86 (07/05 0658) Resp:  [16] 16 (07/05 0658) BP: (136)/(93) 136/93 (07/05 0658) SpO2:  [95 %] 95 % (07/05 0658) Weight:  [172 lb (78 kg)] 172 lb (78 kg) (07/05 0653)  Labs:   Estimated body mass index is 30.47 kg/m as calculated from the following:   Height as of this encounter: 5' 3"  (1.6 m).   Weight as of this encounter: 172 lb (78 kg).   Imaging Review Plain radiographs demonstrate severe avascular necrosis of the right hip(s). The bone quality appears to be good for age and reported activity level.    Preoperative  templating of the joint replacement has been completed, documented, and submitted to the Operating Room personnel in order to optimize intra-operative equipment management.     Assessment/Plan:  End stage avascular necrosis, right hip(s)  The patient history, physical examination, clinical judgement of the provider and imaging studies are consistent with end stage AVN of the right hip(s) and total hip arthroplasty is deemed medically necessary. The treatment options including medical management, injection therapy, arthroscopy and arthroplasty were discussed at length. The risks and benefits of total hip arthroplasty were presented and reviewed. The risks due to aseptic loosening, infection, stiffness, dislocation/subluxation,  thromboembolic complications and other imponderables were discussed.  The patient acknowledged the explanation, agreed to proceed with the plan and consent was signed. Patient is being admitted for inpatient treatment for surgery, pain control, PT, OT, prophylactic antibiotics, VTE prophylaxis, progressive  ambulation and ADL's and discharge planning.The patient is planning to be discharged home with home health services

## 2018-04-11 NOTE — Evaluation (Signed)
Physical Therapy Evaluation Patient Details Name: Margaret White MRN: [id number] DOB: [birth year] Today's Date: 04/11/2018   History of Present Illness  Pt s/p R THR and with hx of seizures and fibromyalgia  Clinical Impression  Pt s/p R THR and presents with decreased R LE strength/ROM and post op pain limiting functional mobility.  Pt should progress well to dc home with family assist.    Follow Up Recommendations Follow surgeon's recommendation for DC plan and follow-up therapies    Equipment Recommendations  None recommended by PT    Recommendations for Other Services OT consult     Precautions / Restrictions Precautions Precautions: Fall Restrictions Weight Bearing Restrictions: No Other Position/Activity Restrictions: WBAT      Mobility  Bed Mobility Overal bed mobility: Needs Assistance Bed Mobility: Supine to Sit     Supine to sit: Min assist     General bed mobility comments: cues for sequence and [place] of L LE to self assist  Transfers Overall transfer level: Needs assistance Equipment used: Rolling walker (2 wheeled) Transfers: Sit to/from Stand Sit to Stand: Min assist         General transfer comment: cues for LE management and use of UEs to self assist  Ambulation/Gait Ambulation/Gait assistance: Min assist Gait Distance (Feet): 45 Feet Assistive device: Rolling walker (2 wheeled) Gait Pattern/deviations: Step-to pattern;Decreased step length - right;Decreased step length - left;Shuffle;Trunk flexed Gait velocity: decr   General Gait Details: cues for sequence, posture and position from [employer]            Wheelchair Mobility    Modified Rankin (Stroke Patients Only)       Balance Overall balance assessment: No apparent balance deficits (not formally assessed)                                           Pertinent Vitals/Pain Pain Assessment: 0-10 Pain Score: 3  Pain Location: R hip Pain Descriptors /  Indicators: Aching;Sore;Pressure Pain Intervention(s): Limited activity within patient's tolerance;Monitored during session;Premedicated before session;Ice applied    Home Living Family/patient expects to be discharged to:: Private residence Living Arrangements: Spouse/significant other Available Help at Discharge: Family Type of Home: House Home Access: Stairs to enter   Technical brewer of Steps: 1 Home Layout: One level Home Equipment: Environmental consultant - 2 wheels;Walker - 4 wheels;Cane - single point      Prior Function Level of Independence: Independent;Independent with assistive device(s)               Hand Dominance        Extremity/Trunk Assessment   Upper Extremity Assessment Upper Extremity Assessment: Overall WFL for tasks assessed    Lower Extremity Assessment Lower Extremity Assessment: RLE deficits/detail    Cervical / Trunk Assessment Cervical / Trunk Assessment: Normal  Communication   Communication: No difficulties  Cognition Arousal/Alertness: Awake/alert Behavior During Therapy: WFL for tasks assessed/performed Overall Cognitive Status: Within Functional Limits for tasks assessed                                        General Comments      Exercises Total Joint Exercises Ankle Circles/Pumps: AROM;Both;15 reps;Supine   Assessment/Plan    PT Assessment Patient needs continued PT services  PT Problem List Decreased strength;Decreased range of  motion;Decreased activity tolerance;Decreased mobility;Decreased knowledge of use of DME;Pain       PT Treatment Interventions DME instruction;Gait training;Stair training;Functional mobility training;Therapeutic activities;Therapeutic exercise;Patient/family education    PT Goals (Current goals can be found in the Care Plan section)  Acute Rehab PT Goals Patient Stated Goal: Regain IND PT Goal Formulation: With patient Time For Goal Achievement: 04/18/18 Potential to Achieve Goals:  Good    Frequency 7X/week   Barriers to discharge        Co-evaluation               AM-PAC PT "6 Clicks" Daily Activity  Outcome Measure Difficulty turning over in bed (including adjusting bedclothes, sheets and blankets)?: Unable Difficulty moving from lying on back to sitting on the side of the bed? : Unable Difficulty sitting down on and standing up from a chair with arms (e.g., wheelchair, bedside commode, etc,.)?: Unable Help needed moving to and from a bed to chair (including a wheelchair)?: A Little Help needed walking in hospital room?: A Little Help needed climbing 3-5 steps with a railing? : A Little 6 Click Score: 12    End of Session Equipment Utilized During Treatment: Gait belt Activity Tolerance: Patient tolerated treatment well;Other (comment)(ltd by nausea) Patient left: in chair;with call bell/phone within reach;with family/visitor present Nurse Communication: Mobility status PT Visit Diagnosis: Difficulty in walking, not elsewhere classified (R26.2)    Time: 2229-7989 PT Time Calculation (min) (ACUTE ONLY): 31 min   Charges:   PT Evaluation $PT Eval Low Complexity: 1 Low PT Treatments $Gait Training: 8-22 mins   PT G Codes:        Pg [phone]    Margaret White 04/11/2018, 2:01 PM

## 2018-04-12 LAB — BASIC METABOLIC PANEL
Anion gap: 8 (ref 5–15)
BUN: 13 mg/dL (ref 8–23)
CHLORIDE: 102 mmol/L (ref 98–111)
CO2: 27 mmol/L (ref 22–32)
Calcium: 8.3 mg/dL — ABNORMAL LOW (ref 8.9–10.3)
Creatinine, Ser: 0.79 mg/dL (ref 0.44–1.00)
GFR calc Af Amer: >60 mL/min (ref >60)
GFR calc non Af Amer: >60 mL/min (ref >60)
Glucose, Bld: 143 mg/dL — ABNORMAL HIGH (ref 70–99)
Potassium: 4.2 mmol/L (ref 3.5–5.1)
SODIUM: 137 mmol/L (ref 135–145)

## 2018-04-12 LAB — CBC
HCT: 37.9 % (ref 36.0–46.0)
HEMOGLOBIN: 12.5 g/dL (ref 12.0–15.0)
MCH: 30.5 pg (ref 26.0–34.0)
MCHC: 33 g/dL (ref 30.0–36.0)
MCV: 92.4 fL (ref 78.0–100.0)
Platelets: 250 10*3/uL (ref 150–400)
RBC: 4.1 MIL/uL (ref 3.87–5.11)
RDW: 14.4 % (ref 11.5–15.5)
WBC: 12.6 10*3/uL — AB (ref 4.0–10.5)

## 2018-04-12 MED ORDER — METHOCARBAMOL 500 MG PO TABS: 500.0000 mg | ORAL_TABLET | Freq: Four times a day (QID) | ORAL | 0 refills | Status: DC | PRN

## 2018-04-12 MED ORDER — FLUOXETINE HCL 20 MG PO CAPS
20.0000 mg | ORAL_CAPSULE | Freq: Every day | ORAL | Status: DC
  Administered 2018-04-12: 20 mg via ORAL
  Filled 2018-04-12: qty 1

## 2018-04-12 MED ORDER — TRAMADOL HCL 50 MG PO TABS: 50.0000 mg | ORAL_TABLET | Freq: Four times a day (QID) | ORAL | 0 refills | Status: DC | PRN

## 2018-04-12 MED ORDER — POTASSIUM GLUCONATE 595 (99 K) MG PO TABS
595.0000 mg | ORAL_TABLET | Freq: Every day | ORAL | Status: DC
  Administered 2018-04-12: 595 mg via ORAL
  Filled 2018-04-12: qty 1

## 2018-04-12 MED ORDER — ASPIRIN 81 MG PO CHEW: 81.0000 mg | CHEWABLE_TABLET | Freq: Two times a day (BID) | ORAL | 0 refills | Status: DC

## 2018-04-12 NOTE — Progress Notes (Signed)
Subjective: 1 Day Post-Op Procedure(s) (LRB): RIGHT TOTAL HIP ARTHROPLASTY ANTERIOR APPROACH (Right) Patient reports pain as mild.    Objective: Vital signs in last 24 hours: Temp:  [97.5 F (36.4 C)-99 F (37.2 C)] 98.6 F (37 C) (07/06 0555) Pulse Rate:  [76-100] 79 (07/06 0557) Resp:  [11-18] 18 (07/06 0555) BP: (93-124)/(34-81) 98/65 (07/06 0557) SpO2:  [93 %-99 %] 97 % (07/06 0555)  Intake/Output from previous day: 07/05 0701 - 07/06 0700 In: 3489.5 [P.O.:180; I.V.:3259.5; IV Piggyback:50] Out: 1890 [VZCHY:8502; Blood:100] Intake/Output this shift: No intake/output data recorded.  Recent Labs    04/12/18 0514  HGB 12.5   Recent Labs    04/12/18 0514  WBC 12.6*  RBC 4.10  HCT 37.9  PLT 250   Recent Labs    04/12/18 0514  NA 137  K 4.2  CL 102  CO2 27  BUN 13  CREATININE 0.79  GLUCOSE 143*  CALCIUM 8.3*   No results for input(s): LABPT, INR in the last 72 hours.  Sensation intact distally Intact pulses distally Dorsiflexion/Plantar flexion intact Incision: dressing C/D/I   Assessment/Plan: 1 Day Post-Op Procedure(s) (LRB): RIGHT TOTAL HIP ARTHROPLASTY ANTERIOR APPROACH (Right) Up with therapy Discharge home with home health this afternoon.    Mcarthur Rossetti 04/12/2018, 8:44 AM

## 2018-04-12 NOTE — Discharge Summary (Signed)
Patient ID: Margaret White MRN: [id number] DOB/AGE: [birth year] 67 y.o.  Admit date: 04/11/2018 Discharge date: 04/12/2018  Admission Diagnoses:  Principal Problem:   Avascular necrosis of hip, right ([place]) Active Problems:   Status post total replacement of right hip   Discharge Diagnoses:  Same  Past Medical History:  Diagnosis Date  . Abnormal LFTs 10/14/2017  . Allergic rhinitis 10/14/2017  . Anxiety   . Essential (primary) hypertension 06/26/2017  . Fibromyalgia 10/14/2017  . GERD (gastroesophageal reflux disease) 10/14/2017  . Headache    cluster HA  . Hepatic steatosis 10/14/2017  . History of cluster headache 10/14/2017  . IBS (irritable bowel syndrome) 10/14/2017  . OSA (obstructive sleep apnea) 10/14/2017  . Pneumonia   . Postmenopausal 10/14/2017  . Primary osteoarthritis of both hands 07/10/2017  . Recurrent major depressive disorder, in full remission ([place]) 06/26/2017  . RLS (restless legs syndrome) 10/14/2017   not diagnosed  . Seizures ([place])    2006  x1    Surgeries: Procedure(s): RIGHT TOTAL HIP ARTHROPLASTY ANTERIOR APPROACH on 04/11/2018   Consultants:   Discharged Condition: Improved  Hospital Course: Karie Skowron is an 67 y.o. female who was admitted 04/11/2018 for operative treatment ofAvascular necrosis of hip, right ([place]). Patient has severe unremitting pain that affects sleep, daily activities, and work/hobbies. After pre-op clearance the patient was taken to the operating room on 04/11/2018 and underwent  Procedure(s): RIGHT TOTAL HIP ARTHROPLASTY ANTERIOR APPROACH.    Patient was given perioperative antibiotics:  Anti-infectives (From admission, onward)   Start     Dose/Rate Route Frequency Ordered Stop   04/11/18 1500  ceFAZolin (ANCEF) IVPB 1 g/50 mL premix     1 g 100 mL/hr over 30 Minutes Intravenous Every 6 hours 04/11/18 1131 04/11/18 2140   04/11/18 0700  ceFAZolin (ANCEF) IVPB 2g/100 mL premix     2 g 200 mL/hr over 30 Minutes Intravenous On call  to O.R. 04/11/18 6333 04/11/18 0852   04/11/18 0657  ceFAZolin (ANCEF) 2-4 GM/100ML-% IVPB    Note to Pharmacy:  Kyra Leyland   : cabinet override      04/11/18 0657 04/11/18 1914       Patient was given sequential compression devices, early ambulation, and chemoprophylaxis to prevent DVT.  Patient benefited maximally from hospital stay and there were no complications.    Recent vital signs:  Patient Vitals for the past 24 hrs:  BP Temp Temp src Pulse Resp SpO2  04/12/18 0557 98/65 - - 79 - -  04/12/18 0555 (!) 93/34 98.6 F (37 C) Oral 80 18 97 %  04/12/18 0239 101/67 98.6 F (37 C) Oral 89 18 94 %  04/11/18 2117 110/73 98 F (36.7 C) Oral 100 16 96 %  04/11/18 1437 103/68 - - 81 15 93 %  04/11/18 1330 113/63 97.6 F (36.4 C) Oral 89 18 95 %  04/11/18 1230 114/74 (!) 97.5 F (36.4 C) Oral 80 15 95 %  04/11/18 1123 117/70 97.9 F (36.6 C) - 79 14 98 %  04/11/18 1100 117/70 97.7 F (36.5 C) - 77 12 97 %  04/11/18 1045 114/72 - - 76 11 97 %  04/11/18 1030 123/70 - - 77 12 99 %  04/11/18 1019 124/81 99 F (37.2 C) - 78 16 99 %     Recent laboratory studies:  Recent Labs    04/12/18 0514  WBC 12.6*  HGB 12.5  HCT 37.9  PLT 250  NA 137  K 4.2  CL 102  CO2 27  BUN 13  CREATININE 0.79  GLUCOSE 143*  CALCIUM 8.3*     Discharge Medications:   Allergies as of 04/12/2018      Reactions   Codeine Itching   Sulfa Antibiotics Rash   Flu like symptoms      Medication List    TAKE these medications   aspirin 81 MG chewable tablet Chew 1 tablet (81 mg total) by mouth 2 (two) times daily.   aspirin-acetaminophen-caffeine 250-250-65 MG tablet Commonly known as:  EXCEDRIN MIGRAINE Take 2 tablets by mouth every 8 (eight) hours as needed for headache.   FLUoxetine 20 MG tablet Commonly known as:  PROZAC Take 1 tablet (20 mg total) by mouth daily.   fluticasone 50 MCG/ACT nasal spray Commonly known as:  FLONASE Place 1 spray into both nostrils daily. What  changed:    when to take this  reasons to take this   hydrochlorothiazide 25 MG tablet Commonly known as:  HYDRODIURIL Take 1 tablet (25 mg total) by mouth daily.   lisinopril 30 MG tablet Commonly known as:  PRINIVIL,ZESTRIL Take 1 tablet (30 mg total) by mouth daily.   loperamide 2 MG capsule Commonly known as:  IMODIUM Take 2 mg by mouth daily. For IBS   methocarbamol 500 MG tablet Commonly known as:  ROBAXIN Take 1 tablet (500 mg total) by mouth every 6 (six) hours as needed for muscle spasms.   montelukast 10 MG tablet Commonly known as:  SINGULAIR Take 1 tablet (10 mg total) by mouth at bedtime. What changed:  when to take this   MYRBETRIQ PO Take 25 mg by mouth daily.   potassium gluconate 595 (99 K) MG Tabs tablet Take 595 mg by mouth daily.   traMADol 50 MG tablet Commonly known as:  ULTRAM Take 1-2 tablets (50-100 mg total) by mouth every 6 (six) hours as needed for moderate pain.            Durable Medical Equipment  (From admission, onward)        Start     Ordered   04/11/18 1132  DME 3 n 1  Once     04/11/18 1131   04/11/18 1132  DME Walker rolling  Once    Question:  Patient needs a walker to treat with the following condition  Answer:  Status post total replacement of right hip   04/11/18 1131      Diagnostic Studies: Dg Pelvis Portable  Result Date: 04/11/2018 CLINICAL DATA:  Hip replacement EXAM: PORTABLE PELVIS 1-2 VIEWS COMPARISON:  None. FINDINGS: Patient is status post right hip replacement. Hardware is in good position. IMPRESSION: Right hip replacement. Electronically Signed   By: Dorise Bullion III M.D   On: 04/11/2018 10:54   Dg C-arm 1-60 Min-no Report  Result Date: 04/11/2018 Fluoroscopy was utilized by the requesting physician.  No radiographic interpretation.   Dg Hip Operative Unilat With Pelvis Right  Result Date: 04/11/2018 CLINICAL DATA:  Right hip replacement EXAM: OPERATIVE right HIP (WITH PELVIS IF PERFORMED) 2 VIEWS  TECHNIQUE: Fluoroscopic spot image(s) were submitted for interpretation post-operatively. COMPARISON:  None. FINDINGS: Two intraoperative spot images demonstrate changes of right hip replacement. Normal AP alignment. No hardware complicating feature. IMPRESSION: Right hip replacement.  No visible complicating feature. Electronically Signed   By: Rolm Baptise M.D.   On: 04/11/2018 10:10    Disposition: Discharge disposition: 01-Home or Self Care       Discharge Instructions  Discharge patient   Complete by:  As directed    Discharge disposition:  01-Home or Self Care   Discharge patient date:  04/12/2018      Follow-up Information    Mcarthur Rossetti, MD Follow up in 2 week(s).   Specialty:  Orthopedic Surgery Contact information: [place] [address] [phone]            Signed: Mcarthur Rossetti 04/12/2018, 8:48 AM

## 2018-04-12 NOTE — Care Management Note (Signed)
Case Management Note  Patient Details  Name: Margaret White MRN: [id number] Date of Birth: Sep 23, 1951  Subjective/Objective:   Right THA                 Action/Plan: NCM spoke to pt and states she does have RW, but not 3n1 bedside commode. Contacted [facility] for 3n1 bedside commode for home. Pt preoperatively arranged with [facility]. Pt agreeable to [facility].   Expected Discharge Date:  04/12/18               Expected Discharge Plan:  [place]  In-House Referral:  NA  Discharge planning Services  CM Consult  Post Acute Care Choice:  Home Health Choice offered to:  Patient  DME Arranged:  3-N-1 DME Agency:  [place]:  PT [place] Agency:  [facility]  Status of Service:  Completed, signed off  If discussed at [employer] of Stay Meetings, dates discussed:    Additional Comments:  Erenest Rasher, RN 04/12/2018, 9:41 AM

## 2018-04-12 NOTE — Progress Notes (Signed)
Physical Therapy Treatment Patient Details Name: Margaret White MRN: [id number] DOB: [birth year] Today's Date: 04/12/2018    History of Present Illness Pt s/p R THR and with hx of seizures and fibromyalgia    PT Comments    Pt continues to progress well, family very supportive, introduced home therex, and reviewed car transfers.   Follow Up Recommendations  Follow surgeon's recommendation for DC plan and follow-up therapies     Equipment Recommendations  None recommended by PT    Recommendations for Other Services OT consult     Precautions / Restrictions Precautions Precautions: Fall Restrictions Weight Bearing Restrictions: No Other Position/Activity Restrictions: WBAT    Mobility  Bed Mobility Overal bed mobility: Needs Assistance Bed Mobility: Supine to Sit     Supine to sit: Min assist     General bed mobility comments: for RLE  Transfers Overall transfer level: Needs assistance Equipment used: Rolling walker (2 wheeled) Transfers: Sit to/from Stand Sit to Stand: Min guard         General transfer comment: for safety  Ambulation/Gait Ambulation/Gait assistance: Min guard Gait Distance (Feet): 30 Feet Assistive device: Rolling walker (2 wheeled) Gait Pattern/deviations: Step-to pattern;Decreased step length - right;Decreased step length - left;Shuffle;Trunk flexed Gait velocity: decr   General Gait Details: cues for sequence, posture and position from [employer]             Wheelchair Mobility    Modified Rankin (Stroke Patients Only)       Balance Overall balance assessment: No apparent balance deficits (not formally assessed)                                          Cognition Arousal/Alertness: Awake/alert Behavior During Therapy: WFL for tasks assessed/performed Overall Cognitive Status: Within Functional Limits for tasks assessed                                        Exercises Total  Joint Exercises Ankle Circles/Pumps: AROM;Both;15 reps;Supine Quad Sets: AROM;Both;10 reps;Supine Heel Slides: AAROM;Right;20 reps;Supine Hip ABduction/ADduction: AAROM;Right;20 reps;Supine    General Comments        Pertinent Vitals/Pain Pain Assessment: 0-10 Pain Score: 6  Pain Location: R hip Pain Descriptors / Indicators: Aching;Sore;Pressure Pain Intervention(s): Limited activity within patient's tolerance;Monitored during session;Premedicated before session;Ice applied    Home Living Family/patient expects to be discharged to:: Private residence Living Arrangements: Spouse/significant other Available Help at Discharge: Family           Additional Comments: 3:1 delivered to room    Prior Function Level of Independence: Independent;Independent with assistive device(s)          PT Goals (current goals can now be found in the care plan section) Acute Rehab PT Goals Patient Stated Goal: Regain IND PT Goal Formulation: With patient Time For Goal Achievement: 04/18/18 Potential to Achieve Goals: Good Progress towards PT goals: Progressing toward goals    Frequency    7X/week      PT Plan Current plan remains appropriate    Co-evaluation              AM-PAC PT "6 Clicks" Daily Activity  Outcome Measure  Difficulty turning over in bed (including adjusting bedclothes, sheets and blankets)?: Unable Difficulty moving from lying on back to sitting  on the side of the bed? : A Lot Difficulty sitting down on and standing up from a chair with arms (e.g., wheelchair, bedside commode, etc,.)?: Unable Help needed moving to and from a bed to chair (including a wheelchair)?: A Little Help needed walking in hospital room?: A Little Help needed climbing 3-5 steps with a railing? : A Little 6 Click Score: 13    End of Session Equipment Utilized During Treatment: Gait belt Activity Tolerance: Patient tolerated treatment well Patient left: in bed;with call  bell/phone within reach;with family/visitor present Nurse Communication: Mobility status PT Visit Diagnosis: Difficulty in walking, not elsewhere classified (R26.2)     Time: 9381-8299 PT Time Calculation (min) (ACUTE ONLY): 24 min  Charges:  $Gait Training: 8-22 mins $Therapeutic Activity: 8-22 mins                    G Codes:       Pg [phone]    Margaret White 04/12/2018, 3:52 PM

## 2018-04-12 NOTE — Progress Notes (Signed)
Discharged from floor via w/c for transport home by car. Belongings & family with pt. No changes in assessment. Chaske Paskett, [employer]

## 2018-04-12 NOTE — Progress Notes (Signed)
Physical Therapy Treatment Patient Details Name: Margaret White MRN: [id number] DOB: 09/15/1951 Today's Date: 04/12/2018    History of Present Illness Pt s/p R THR and with hx of seizures and fibromyalgia    PT Comments    Pt motivated but session ltd 2* pain - pt has been reluctant to take meds 2* itching but requesting pain meds now.     Follow Up Recommendations  Follow surgeon's recommendation for DC plan and follow-up therapies     Equipment Recommendations  None recommended by PT    Recommendations for Other Services OT consult     Precautions / Restrictions Precautions Precautions: Fall Restrictions Weight Bearing Restrictions: No Other Position/Activity Restrictions: WBAT    Mobility  Bed Mobility                  Transfers Overall transfer level: Needs assistance Equipment used: Rolling walker (2 wheeled) Transfers: Sit to/from Stand Sit to Stand: Min guard         General transfer comment: cues for LE management and use of UEs to self assist  Ambulation/Gait Ambulation/Gait assistance: Min guard Gait Distance (Feet): 130 Feet Assistive device: Rolling walker (2 wheeled) Gait Pattern/deviations: Step-to pattern;Decreased step length - right;Decreased step length - left;Shuffle;Trunk flexed Gait velocity: decr   General Gait Details: cues for sequence, posture and position from RW   Stairs Stairs: Yes Stairs assistance: Min assist Stair Management: No rails;Step to pattern;Forwards;With walker Number of Stairs: 1 General stair comments: min cues for sequence and foot/RW placement   Wheelchair Mobility    Modified Rankin (Stroke Patients Only)       Balance Overall balance assessment: No apparent balance deficits (not formally assessed)                                          Cognition Arousal/Alertness: Awake/alert Behavior During Therapy: WFL for tasks assessed/performed Overall Cognitive Status: Within  Functional Limits for tasks assessed                                        Exercises Total Joint Exercises Ankle Circles/Pumps: AROM;Both;15 reps;Supine    General Comments        Pertinent Vitals/Pain Pain Assessment: 0-10 Pain Score: 6  Pain Location: R hip Pain Descriptors / Indicators: Aching;Sore;Pressure Pain Intervention(s): Limited activity within patient's tolerance;Monitored during session;Ice applied;Patient requesting pain meds-RN notified    Home Living                      Prior Function            PT Goals (current goals can now be found in the care plan section) Acute Rehab PT Goals Patient Stated Goal: Regain IND PT Goal Formulation: With patient Time For Goal Achievement: 04/18/18 Potential to Achieve Goals: Good Progress towards PT goals: Progressing toward goals    Frequency    7X/week      PT Plan Current plan remains appropriate    Co-evaluation              AM-PAC PT "6 Clicks" Daily Activity  Outcome Measure  Difficulty turning over in bed (including adjusting bedclothes, sheets and blankets)?: Unable Difficulty moving from lying on back to sitting on the side of the bed? : Unable  Difficulty sitting down on and standing up from a chair with arms (e.g., wheelchair, bedside commode, etc,.)?: Unable Help needed moving to and from a bed to chair (including a wheelchair)?: A Little Help needed walking in hospital room?: A Little Help needed climbing 3-5 steps with a railing? : A Little 6 Click Score: 12    End of Session Equipment Utilized During Treatment: Gait belt Activity Tolerance: Patient tolerated treatment well;Other (comment) Patient left: in chair;with call bell/phone within reach;with family/visitor present Nurse Communication: Mobility status PT Visit Diagnosis: Difficulty in walking, not elsewhere classified (R26.2)     Time: 2446-9507 PT Time Calculation (min) (ACUTE ONLY): 18  min  Charges:  $Gait Training: 8-22 mins                    G Codes:       Pg [phone]    Mahari Vankirk 04/12/2018, 11:33 AM

## 2018-04-12 NOTE — Discharge Instructions (Signed)

## 2018-04-12 NOTE — Progress Notes (Signed)
OT Evaluation  04/12/18 1300  OT Visit Information  Last OT Received On 04/12/18  Assistance Needed +1  History of Present Illness Pt s/p R THR and with hx of seizures and fibromyalgia  Precautions  Precautions Fall  Restrictions  Other Position/Activity Restrictions WBAT  Home Living  Family/patient expects to be discharged to: Private residence  Living Arrangements Spouse/significant other  Available Help at Discharge Family  Bathroom Shower/Tub Walk-in shower  Bathroom Toilet Standard  Additional Comments 3:1 delivered to room  Prior Function  Level of Independence Independent;Independent with assistive device(s)  Communication  Communication No difficulties  Pain Assessment  Pain Score 6  Pain Location R hip  Pain Descriptors / Indicators Aching;Sore;Pressure  Pain Intervention(s) Limited activity within patient's tolerance;Monitored during session;Premedicated before session;Repositioned  Cognition  Arousal/Alertness Awake/alert  Behavior During Therapy WFL for tasks assessed/performed  Overall Cognitive Status Within Functional Limits for tasks assessed  Upper Extremity Assessment  Upper Extremity Assessment Overall WFL for tasks assessed  ADL  Overall ADL's  Needs assistance/impaired  Eating/Feeding Independent  Grooming Supervision/safety;Standing  Upper Body Bathing Set up;Sitting  Lower Body Bathing Minimal assistance;Sit to/from stand  Upper Body Dressing  Set up;Sitting  Lower Body Dressing Moderate assistance;Sit to/from Environmental education officer guard;Ambulation;BSC;RW  Toileting- Music therapist guard;Sit to/from stand  Tub/ IT consultant shower;Min guard;Ambulation  General ADL Comments educated on AE options; daughter will assist as needed; pt is very independent.  Practiced shower transfer and 3;1 transfer and got dressed  Bed Mobility  Supine to sit Min assist  General bed mobility comments for RLE  Transfers   Equipment used Rolling walker (2 wheeled)  Sit to Stand Min guard  General transfer comment for safety  OT - End of Session  Activity Tolerance Patient tolerated treatment well  Patient left in bed;with call bell/phone within reach;with family/visitor present  OT Assessment  OT Recommendation/Assessment Patient does not need any further OT services  OT Visit Diagnosis Pain  Pain - Right/Left Right  Pain - part of body Hip  AM-PAC OT "6 Clicks" Daily Activity Outcome Measure  Help from another person eating meals? 4  Help from another person taking care of personal grooming? 3  Help from another person toileting, which includes using toliet, bedpan, or urinal? 3  Help from another person bathing (including washing, rinsing, drying)? 3  Help from another person to put on and taking off regular upper body clothing? 3  Help from another person to put on and taking off regular lower body clothing? 2  6 Click Score 18  ADL G Code Conversion CK  OT Recommendation  Follow Up Recommendations Supervision/Assistance - 24 hour  OT Equipment 3 in 1 bedside commode  Acute Rehab OT Goals  Patient Stated Goal Regain IND  OT Goal Formulation All assessment and education complete, DC therapy  OT Time Calculation  OT Start Time (ACUTE ONLY) 1257  OT Stop Time (ACUTE ONLY) 1311  OT Time Calculation (min) 14 min  OT General Charges  $OT Visit 1 Visit  OT Evaluation  $OT Eval Low Complexity [place], OTR/L [phone] 04/12/2018

## 2018-04-14 ENCOUNTER — Telehealth (INDEPENDENT_AMBULATORY_CARE_PROVIDER_SITE_OTHER): Payer: Self-pay

## 2018-04-14 ENCOUNTER — Encounter (HOSPITAL_COMMUNITY): Payer: Self-pay | Admitting: Orthopaedic Surgery

## 2018-04-14 NOTE — Telephone Encounter (Signed)
Margaret White with [facility] wanted to let Dr. Ninfa Linden know that they will be going out to see patient on Tuesday, 04/15/2018 for Home Health.

## 2018-04-15 ENCOUNTER — Telehealth (INDEPENDENT_AMBULATORY_CARE_PROVIDER_SITE_OTHER): Payer: Self-pay | Admitting: Orthopaedic Surgery

## 2018-04-15 DIAGNOSIS — M19041 Primary osteoarthritis, right hand: Secondary | ICD-10-CM | POA: Diagnosis not present

## 2018-04-15 DIAGNOSIS — M1612 Unilateral primary osteoarthritis, left hip: Secondary | ICD-10-CM | POA: Diagnosis not present

## 2018-04-15 DIAGNOSIS — M797 Fibromyalgia: Secondary | ICD-10-CM | POA: Diagnosis not present

## 2018-04-15 DIAGNOSIS — I10 Essential (primary) hypertension: Secondary | ICD-10-CM | POA: Diagnosis not present

## 2018-04-15 DIAGNOSIS — Z471 Aftercare following joint replacement surgery: Secondary | ICD-10-CM | POA: Diagnosis not present

## 2018-04-15 DIAGNOSIS — M19042 Primary osteoarthritis, left hand: Secondary | ICD-10-CM | POA: Diagnosis not present

## 2018-04-15 NOTE — Telephone Encounter (Signed)
[facility] at [facility] is requesting home health pt orders   3 x for 1 week 2 x for 1 week Starting today, 7/9.  CB # [phone]

## 2018-04-15 NOTE — Telephone Encounter (Signed)
Verbal order given  

## 2018-04-17 DIAGNOSIS — M19042 Primary osteoarthritis, left hand: Secondary | ICD-10-CM | POA: Diagnosis not present

## 2018-04-17 DIAGNOSIS — Z471 Aftercare following joint replacement surgery: Secondary | ICD-10-CM | POA: Diagnosis not present

## 2018-04-17 DIAGNOSIS — I10 Essential (primary) hypertension: Secondary | ICD-10-CM | POA: Diagnosis not present

## 2018-04-17 DIAGNOSIS — M797 Fibromyalgia: Secondary | ICD-10-CM | POA: Diagnosis not present

## 2018-04-17 DIAGNOSIS — M1612 Unilateral primary osteoarthritis, left hip: Secondary | ICD-10-CM | POA: Diagnosis not present

## 2018-04-17 DIAGNOSIS — M19041 Primary osteoarthritis, right hand: Secondary | ICD-10-CM | POA: Diagnosis not present

## 2018-04-18 DIAGNOSIS — I10 Essential (primary) hypertension: Secondary | ICD-10-CM | POA: Diagnosis not present

## 2018-04-18 DIAGNOSIS — M1612 Unilateral primary osteoarthritis, left hip: Secondary | ICD-10-CM | POA: Diagnosis not present

## 2018-04-18 DIAGNOSIS — M19041 Primary osteoarthritis, right hand: Secondary | ICD-10-CM | POA: Diagnosis not present

## 2018-04-18 DIAGNOSIS — M797 Fibromyalgia: Secondary | ICD-10-CM | POA: Diagnosis not present

## 2018-04-18 DIAGNOSIS — Z471 Aftercare following joint replacement surgery: Secondary | ICD-10-CM | POA: Diagnosis not present

## 2018-04-18 DIAGNOSIS — M19042 Primary osteoarthritis, left hand: Secondary | ICD-10-CM | POA: Diagnosis not present

## 2018-04-21 DIAGNOSIS — M1612 Unilateral primary osteoarthritis, left hip: Secondary | ICD-10-CM | POA: Diagnosis not present

## 2018-04-21 DIAGNOSIS — M19042 Primary osteoarthritis, left hand: Secondary | ICD-10-CM | POA: Diagnosis not present

## 2018-04-21 DIAGNOSIS — I10 Essential (primary) hypertension: Secondary | ICD-10-CM | POA: Diagnosis not present

## 2018-04-21 DIAGNOSIS — M19041 Primary osteoarthritis, right hand: Secondary | ICD-10-CM | POA: Diagnosis not present

## 2018-04-21 DIAGNOSIS — M797 Fibromyalgia: Secondary | ICD-10-CM | POA: Diagnosis not present

## 2018-04-21 DIAGNOSIS — Z471 Aftercare following joint replacement surgery: Secondary | ICD-10-CM | POA: Diagnosis not present

## 2018-04-24 ENCOUNTER — Ambulatory Visit: Payer: Self-pay | Admitting: *Deleted

## 2018-04-24 ENCOUNTER — Encounter (INDEPENDENT_AMBULATORY_CARE_PROVIDER_SITE_OTHER): Payer: Self-pay | Admitting: Orthopaedic Surgery

## 2018-04-24 ENCOUNTER — Ambulatory Visit (INDEPENDENT_AMBULATORY_CARE_PROVIDER_SITE_OTHER): Payer: Medicare Other | Admitting: Orthopaedic Surgery

## 2018-04-24 VITALS — BP 114/76 | HR 121

## 2018-04-24 DIAGNOSIS — Z96641 Presence of right artificial hip joint: Secondary | ICD-10-CM

## 2018-04-24 NOTE — Telephone Encounter (Signed)
Patient is calling because she is concerned about the increase in her heart rate over the last week. She has restarted her BP medications and they are not helping to bring her pulse rate down. Patient states she is post surgical- hip replacement and she does have a follow up appointment today. She is going to discuss her symptoms with her surgeon- but wants her PCP to be aware of the increase in her heart rate. Patient states she is fatigued and states she feel out of breath at times. She does not report any significant pain at this time. Call to office to discuss patient symptoms with clinical nurse to see if patient should be directed to office or Ed- they are at lunch- note sent to office for review.  Reason for Disposition . New or worsened shortness of breath with activity (dyspnea on exertion)  Answer Assessment - Initial Assessment Questions 1. DESCRIPTION: "Please describe your heart rate or heart beat that you are having" (e.g., fast/slow, regular/irregular, skipped or extra beats, "palpitations")     85-98 range-  Patient had stopped BP medication - she has restarted her BP within the last 4-5 days 2. ONSET: "When did it start?" (Minutes, hours or days)      Pulse has increased since being off medication- it is 108 today 3. DURATION: "How long does it last" (e.g., seconds, minutes, hours)     It has been elevated all week 4. PATTERN "Does it come and go, or has it been constant since it started?"  "Does it get worse with exertion?"   "Are you feeling it now?"     constant 5. TAP: "Using your hand, can you tap out what you are feeling on a chair or table in front of you, so that I can hear?" (Note: not all patients can do this)       unable 6. HEART RATE: "Can you tell me your heart rate?" "How many beats in 15 seconds?"  (Note: not all patients can do this)       108 7. RECURRENT SYMPTOM: "Have you ever had this before?" If so, ask: "When was the last time?" and "What happened that time?"       Patient does not recall it being high before 8. CAUSE: "What do you think is causing the palpitations?"     Not sure 9. CARDIAC HISTORY: "Do you have any history of heart disease?" (e.g., heart attack, angina, bypass surgery, angioplasty, arrhythmia)      High BP 10. OTHER SYMPTOMS: "Do you have any other symptoms?" (e.g., dizziness, chest pain, sweating, difficulty breathing)       Fatigued, stomach doesn't feel right, feels out of breath 11. PREGNANCY: "Is there any chance you are pregnant?" "When was your last menstrual period?"       n/a  Protocols used: HEART RATE AND HEARTBEAT QUESTIONS-A-AH

## 2018-04-24 NOTE — Progress Notes (Signed)
The patient is 2 weeks tomorrow status post a right total hip arthroplasty.  She says she is doing well overall.  She has had some slight shortness of breath but only when she is exerting herself going the bathroom that has been minimal.  Denies any calf pain or foot swelling on the right side.  She does report some knee pain postoperatively.  Overall though she feels like she is doing great and she is ambulate with a rolling walker but very mobile.  On exam her incision looks good.  Replaced the Steri-Strips.  There is no significant seroma.  Her leg lengths are near equal.  She tolerates me putting her hip through range of motion as well.  At this point should continue aspirin for just 1 more week and can stop her aspirin.  She does not need pain medication she states.  She can drop them from my standpoint.  We will see her back in 4 weeks to see how she is doing overall but no x-rays are needed.  All question concerns were answered and addressed.

## 2018-04-24 NOTE — Telephone Encounter (Signed)
Called patient she was seen by ortho today and her BP was high and her HR was 120. She was evaluated by that office and did not have any swelling or chest pains. Patient was told to call PCP office for follow up.  She was not able to check her vitals at this time she was in the car on the way to town. She denies any chest pain only heavy feeling when she is out side and tries to take deep breath. She has not had any numbness or tingling in hands or shoulder. She has not had any jaw or ear pain. She d/c her blood pressure medications from time she had surgery until about five days ago because her bp was getting to low and was advised to hold by surgeon. She was keeping check on it and it started increasing about five days ago so she restarted. Her bp have been running around 115/70 to 120/80 with HR of 85-97 on medication the last few days. I have made app with our office in the morning at 7:00. She will go to ED if any changes in symptoms.

## 2018-04-24 NOTE — Telephone Encounter (Signed)
See note

## 2018-04-25 ENCOUNTER — Ambulatory Visit (INDEPENDENT_AMBULATORY_CARE_PROVIDER_SITE_OTHER): Payer: Medicare Other

## 2018-04-25 ENCOUNTER — Ambulatory Visit (INDEPENDENT_AMBULATORY_CARE_PROVIDER_SITE_OTHER): Payer: Medicare Other | Admitting: Family Medicine

## 2018-04-25 ENCOUNTER — Encounter: Payer: Self-pay | Admitting: Family Medicine

## 2018-04-25 VITALS — BP 120/78 | HR 100 | Temp 98.7°F | Ht 63.0 in | Wt 167.4 lb

## 2018-04-25 DIAGNOSIS — R05 Cough: Secondary | ICD-10-CM | POA: Diagnosis not present

## 2018-04-25 DIAGNOSIS — R0989 Other specified symptoms and signs involving the circulatory and respiratory systems: Secondary | ICD-10-CM | POA: Diagnosis not present

## 2018-04-25 DIAGNOSIS — R0602 Shortness of breath: Secondary | ICD-10-CM

## 2018-04-25 DIAGNOSIS — R059 Cough, unspecified: Secondary | ICD-10-CM

## 2018-04-25 DIAGNOSIS — Z96641 Presence of right artificial hip joint: Secondary | ICD-10-CM

## 2018-04-25 LAB — CBC WITH DIFFERENTIAL/PLATELET
Basophils Absolute: 0.1 10*3/uL (ref 0.0–0.1)
Basophils Relative: 0.6 % (ref 0.0–3.0)
Eosinophils Absolute: 0.7 10*3/uL (ref 0.0–0.7)
Eosinophils Relative: 9 % — ABNORMAL HIGH (ref 0.0–5.0)
HCT: 44.3 % (ref 36.0–46.0)
Hemoglobin: 14.8 g/dL (ref 12.0–15.0)
Lymphocytes Relative: 25.4 % (ref 12.0–46.0)
Lymphs Abs: 2 10*3/uL (ref 0.7–4.0)
MCHC: 33.4 g/dL (ref 30.0–36.0)
MCV: 90.7 fl (ref 78.0–100.0)
Monocytes Absolute: 0.7 10*3/uL (ref 0.1–1.0)
Monocytes Relative: 8.2 % (ref 3.0–12.0)
Neutro Abs: 4.5 10*3/uL (ref 1.4–7.7)
Neutrophils Relative %: 56.8 % (ref 43.0–77.0)
Platelets: 419 10*3/uL — ABNORMAL HIGH (ref 150.0–400.0)
RBC: 4.88 Mil/uL (ref 3.87–5.11)
RDW: 14.6 % (ref 11.5–15.5)
WBC: 8 10*3/uL (ref 4.0–10.5)

## 2018-04-25 LAB — COMPREHENSIVE METABOLIC PANEL
ALT: 36 U/L — ABNORMAL HIGH (ref 0–35)
AST: 23 U/L (ref 0–37)
Albumin: 4.2 g/dL (ref 3.5–5.2)
Alkaline Phosphatase: 92 U/L (ref 39–117)
BUN: 20 mg/dL (ref 6–23)
CO2: 29 mEq/L (ref 19–32)
Calcium: 9.9 mg/dL (ref 8.4–10.5)
Chloride: 98 mEq/L (ref 96–112)
Creatinine, Ser: 1.08 mg/dL (ref 0.40–1.20)
GFR: 54.01 mL/min — ABNORMAL LOW (ref >60.00)
Glucose, Bld: 117 mg/dL — ABNORMAL HIGH (ref 70–99)
Potassium: 3.5 mEq/L (ref 3.5–5.1)
Sodium: 138 mEq/L (ref 135–145)
Total Bilirubin: 0.5 mg/dL (ref 0.2–1.2)
Total Protein: 7.2 g/dL (ref 6.0–8.3)

## 2018-04-25 MED ORDER — AMLODIPINE BESYLATE 5 MG PO TABS: 5.0000 mg | ORAL_TABLET | Freq: Every day | ORAL | 2 refills | Status: DC

## 2018-04-25 MED ORDER — AMOXICILLIN 875 MG PO TABS: 875.0000 mg | ORAL_TABLET | Freq: Two times a day (BID) | ORAL | 0 refills | Status: DC

## 2018-04-25 NOTE — Progress Notes (Signed)
Margaret White is a 67 y.o. female here for an acute visit.  History of Present Illness:   Margaret White, CMA acting as scribe for Dr. Briscoe Deutscher.   HPI: Patient in office for evaluation of elevated heart rate. She was seen by orthopedic yesterday  and her BP was high and her HR was 120. She was evaluated by that office and did not have any swelling or chest pains. Patient was told to call PCP office for follow up.   She denies any chest pain only heavy feeling when she is out side and tries to take deep breath. She has not had any numbness or tingling in hands or shoulder. She has not had any jaw or ear pain. She stopped her blood pressure medications from time she had surgery until about five days ago because her blood pressure was getting too low (reported 86/60) and was advised to hold by surgeon. She was keeping a check on it and it started increasing about five days ago so she restarted. Her BPs have been running around 115/70 to 120/80 with HR of 85-97 on medication the last few days. She has noticed increased nausea that started around the same time that she restarted medications.    Hip surgery went well.   PMHx, SurgHx, SocialHx, Medications, and Allergies were reviewed in the Visit Navigator and updated as appropriate.  Current Medications:   .  aspirin 81 MG chewable tablet, Chew 1 tablet (81 mg total) by mouth 2 (two) times daily., Disp: 60 tablet, Rfl: 0 .  aspirin-acetaminophen-caffeine (EXCEDRIN MIGRAINE) 250-250-65 MG tablet, Take 2 tablets by mouth every 8 (eight) hours as needed for headache., Disp: , Rfl:  .  FLUoxetine (PROZAC) 20 MG tablet, Take 1 tablet (20 mg total) by mouth daily., Disp: 90 tablet, Rfl: 1 .  fluticasone (FLONASE) 50 MCG/ACT nasal spray, Place 1 spray into both nostrils daily. (Patient taking differently: Place 1 spray into both nostrils daily as needed for allergies. ), Disp: 16 g, Rfl: 3 .  hydrochlorothiazide (HYDRODIURIL) 25 MG tablet, Take  1 tablet (25 mg total) by mouth daily., Disp: 90 tablet, Rfl: 3 .  lisinopril (PRINIVIL,ZESTRIL) 30 MG tablet, Take 1 tablet (30 mg total) by mouth daily., Disp: 90 tablet, Rfl: 1 .  loperamide (IMODIUM) 2 MG capsule, Take 2 mg by mouth daily. For IBS, Disp: , Rfl:  .  methocarbamol (ROBAXIN) 500 MG tablet, Take 1 tablet (500 mg total) by mouth every 6 (six) hours as needed for muscle spasms., Disp: 40 tablet, Rfl: 0 .  Mirabegron (MYRBETRIQ PO), Take 25 mg by mouth daily., Disp: , Rfl:  .  montelukast (SINGULAIR) 10 MG tablet, Take 1 tablet (10 mg total) by mouth at bedtime. (Patient taking differently: Take 10 mg by mouth daily. ), Disp: 90 tablet, Rfl: 3 .  potassium gluconate 595 (99 K) MG TABS tablet, Take 595 mg by mouth daily. , Disp: , Rfl:  .  traMADol (ULTRAM) 50 MG tablet, Take 1-2 tablets (50-100 mg total) by mouth every 6 (six) hours as needed for moderate pain., Disp: 50 tablet, Rfl: 0   Allergies  Allergen Reactions  . Codeine Itching  . Sulfa Antibiotics Rash    Flu like symptoms   Review of Systems:   Pertinent items are noted in the HPI. Otherwise, ROS is negative.  Vitals:   Vitals:   04/25/18 0658  BP: 120/78  Pulse: 100  Temp: 98.7 F (37.1 C)  TempSrc: Oral  SpO2: 96%  Weight: 167 lb 6.4 oz (75.9 kg)  Height: 5' 3"  (1.6 m)     Body mass index is 29.65 kg/m.  Physical Exam:   Physical Exam  Constitutional: She appears well-nourished.  HENT:  Head: Normocephalic and atraumatic.  Eyes: Pupils are equal, round, and reactive to light. EOM are normal.  Neck: Normal range of motion. Neck supple.  Cardiovascular: Normal rate, regular rhythm, normal heart sounds and intact distal pulses.  Pulmonary/Chest: Effort normal.  Abdominal: Soft.  Skin: Skin is warm.  Psychiatric: She has a normal mood and affect. Her behavior is normal.  Nursing note and vitals reviewed.  EKG: unchanged from previous tracings.   Assessment and Plan:   Margaret White was seen today  for follow-up.  Diagnoses and all orders for this visit:  Labile blood pressure Comments: Stopped lisinopril 30/HCTZ 25. Will trial Norvasc for her tachycardia. She will monitor HR and BP closely. Benefits v risks reviewed.   The 10-year ASCVD risk score Mikey Bussing DC Brooke Bonito., et al., 2013) is: 6.7%   Values used to calculate the score:     Age: 60 years     Sex: Female     Is Non-Hispanic African American: No     Diabetic: No     Tobacco smoker: No     Systolic Blood Pressure: 048 mmHg     Is BP treated: Yes     HDL Cholesterol: 68.1 mg/dL     Total Cholesterol: 246 mg/dL  Orders: -     Ambulatory referral to Cardiology -     CBC with Differential/Platelet -     Comprehensive metabolic panel -     amLODipine (NORVASC) 5 MG tablet; Take 1 tablet (5 mg total) by mouth daily.  Cough Comments: Mild. Associated with new right maxillary pressure. Safety net Rx provided.  Orders: -     DG Chest 2 View -     amoxicillin (AMOXIL) 875 MG tablet; Take 1 tablet (875 mg total) by mouth 2 (two) times daily.  SOB (shortness of breath) -     EKG 12-Lead  Status post total replacement of right hip Comments: Doing well.   . Reviewed expectations re: course of current medical issues. . Discussed self-management of symptoms. . Outlined signs and symptoms indicating need for more acute intervention. . Patient verbalized understanding and all questions were answered. Marland Kitchen Health Maintenance issues including appropriate healthy diet, exercise, and smoking avoidance were discussed with patient. . See orders for this visit as documented in the electronic medical record. . Patient received an After Visit Summary.  CMA served as Education administrator during this visit. History, Physical, and Plan performed by medical provider. The above documentation has been reviewed and is accurate and complete. Briscoe Deutscher, D.O.  Briscoe Deutscher, DO [place] 04/26/2018

## 2018-04-25 NOTE — Patient Instructions (Addendum)
STOP LISINOPRIL AND HCTZ. START NORVASC.

## 2018-04-29 ENCOUNTER — Encounter: Payer: Self-pay | Admitting: Family Medicine

## 2018-04-30 ENCOUNTER — Encounter: Payer: Self-pay | Admitting: Family Medicine

## 2018-04-30 ENCOUNTER — Other Ambulatory Visit: Payer: Self-pay | Admitting: Surgical

## 2018-04-30 MED ORDER — FLUCONAZOLE 150 MG PO TABS: ORAL_TABLET | ORAL | 0 refills | Status: DC

## 2018-05-05 DIAGNOSIS — L718 Other rosacea: Secondary | ICD-10-CM

## 2018-05-05 HISTORY — DX: Other rosacea: L71.8

## 2018-05-08 DIAGNOSIS — N3942 Incontinence without sensory awareness: Secondary | ICD-10-CM | POA: Insufficient documentation

## 2018-05-08 DIAGNOSIS — N3946 Mixed incontinence: Secondary | ICD-10-CM | POA: Insufficient documentation

## 2018-05-10 ENCOUNTER — Encounter: Payer: Self-pay | Admitting: Family Medicine

## 2018-05-12 ENCOUNTER — Ambulatory Visit
Admission: RE | Admit: 2018-05-12 | Discharge: 2018-05-12 | Disposition: A | Discharge status: Home / Self Care | Payer: Medicare Other | Source: Ambulatory Visit | Attending: Family Medicine | Admitting: Family Medicine

## 2018-05-12 ENCOUNTER — Other Ambulatory Visit: Payer: Self-pay | Admitting: Surgical

## 2018-05-12 DIAGNOSIS — R0989 Other specified symptoms and signs involving the circulatory and respiratory systems: Secondary | ICD-10-CM

## 2018-05-12 DIAGNOSIS — Z1239 Encounter for other screening for malignant neoplasm of breast: Secondary | ICD-10-CM

## 2018-05-12 DIAGNOSIS — Z1231 Encounter for screening mammogram for malignant neoplasm of breast: Secondary | ICD-10-CM | POA: Diagnosis not present

## 2018-05-12 HISTORY — DX: Osteoarthritis of hip, unspecified: M16.9

## 2018-05-12 HISTORY — DX: Cystocele, unspecified: N81.10

## 2018-05-12 HISTORY — DX: Osteomyelitis, unspecified: M86.9

## 2018-05-12 HISTORY — DX: Chronic maxillary sinusitis: J32.0

## 2018-05-12 HISTORY — DX: Spondylosis without myelopathy or radiculopathy, lumbar region: M47.816

## 2018-05-12 HISTORY — DX: Idiopathic aseptic necrosis of right femur: M87.051

## 2018-05-12 HISTORY — DX: Essential (primary) hypertension: I10

## 2018-05-12 HISTORY — DX: Idiopathic aseptic necrosis of unspecified bone: M87.00

## 2018-05-12 HISTORY — DX: Other rosacea: L71.8

## 2018-05-12 MED ORDER — AMLODIPINE BESYLATE 5 MG PO TABS: 5.0000 mg | ORAL_TABLET | Freq: Every day | ORAL | 2 refills | Status: DC

## 2018-05-13 NOTE — Progress Notes (Signed)
Cardiology Office Note:    Date:  05/14/2018   ID:  Margaret White, DOB 13-Feb-1951, MRN [id number]  PCP:  Briscoe Deutscher, DO  Cardiologist:  Shirlee More, MD   Referring MD: Briscoe Deutscher, DO  ASSESSMENT:    1. Essential hypertension   2. SOB (shortness of breath) on exertion   3. Hypokalemia   4. OSA (obstructive sleep apnea)    PLAN:    In order of problems listed above:  1. I suspect postoperative hypertension was poorly controlled related to her response to surgery and general anesthesia.  I placed her on a low-dose beta-blocker which should counteract heightened catecholamines and we can consider withdrawing in the future depending on blood pressure response. 2. Uncertain etiology and may be a consequence of poorly treated sleep apnea we will check a proBNP level and echocardiogram regarding cardiac etiology especially heart failure. 3. Recheck renal function along with a magnesium level avoid thiazide diuretic 4. Likely etiology of her chronic exertional shortness of breath may benefit from referral to ENT  Next appointment 1 month   Medication Adjustments/Labs and Tests Ordered: Current medicines are reviewed at length with the patient today.  Concerns regarding medicines are outlined above.  Orders Placed This Encounter  Procedures  . Pro b natriuretic peptide (BNP)  . Basic metabolic panel  . Magnesium  . ECHOCARDIOGRAM COMPLETE   Meds ordered this encounter  Medications  . metoprolol succinate (TOPROL XL) 25 MG 24 hr tablet    Sig: Take 1 tablet (25 mg total) by mouth daily.    Dispense:  90 tablet    Refill:  2     Chief Complaint  Patient presents with  . Irregular Heart Beat  . Shortness of Breath  . Hypertension  1 month follow-up  History of Present Illness:    Margaret White is a 67 y.o. female with a recent R THA 04/11/18 who is being seen today for the evaluation of hypertension with labile BP measurements at the request of Briscoe Deutscher,  DO.  She has a long history of sleep apnea CPAP intolerant and chronic shortness of breath walking outdoors longer distance or uphill.  She has no history of lung disease or heart disease non-smoker no cough or wheezing and the pattern is unchanged.  Since she had total hip arthroplasty her heart rate is been rapid greater than 100 at times and blood pressure is been elevated and variable.  She has chronic hypokalemia with a thiazide diuretic recently ACE inhibitor diuretic discontinued and placed on calcium channel blocker.  She is concerned by variable blood pressure although recent determinations are at target less than 355-732 systolic and heart rates recently have been less than 80-90.  She has no edema orthopnea chest pain palpitation or syncope No history of congenital or rheumatic heart disease   Past Medical History:  Diagnosis Date  . Abnormal LFTs 10/14/2017  . Allergic rhinitis 10/14/2017  . Anxiety   . Avascular necrosis ([place]) 03/24/2018  . Avascular necrosis of hip, right ([place]) 03/24/2018  . Chronic maxillary sinusitis 10/14/2017  . Essential (primary) hypertension 06/26/2017  . Essential hypertension 06/26/2017   Last Assessment & Plan:  Low sodium diet  Check labs and monitor  Continue meds  . Female cystocele 10/14/2017  . Fibromyalgia 10/14/2017  . GERD (gastroesophageal reflux disease) 10/14/2017  . Headache    cluster HA  . Hepatic steatosis 10/14/2017  . 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 History of cluster headache 10/14/2017  . IBS (irritable bowel syndrome) 10/14/2017  . 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 OSA (obstructive sleep apnea) 10/14/2017  . 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 Pneumonia   . Postmenopausal 10/14/2017  . Primary osteoarthritis of both hands  07/10/2017  . Recurrent major depressive disorder, in full remission ([place]) 06/26/2017  . RLS (restless legs syndrome) 10/14/2017   not diagnosed  . Seizures ([place])    2006  x1  . 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 Shallow disc bulging without significant neuroforaminal stenosis.    Past Surgical History:  Procedure Laterality Date  . atopic pregnancy    . BREAST BIOPSY Right    approximately 10 years ago/ benign    . BREAST BIOPSY Right   . CARPAL TUNNEL RELEASE     bil  . EYE SURGERY     as a child eyes went out, Bil cataracts 2019  . JOINT REPLACEMENT     Right total hip Dr. Zollie Beckers  . LAPAROSCOPIC TOTAL HYSTERECTOMY    . NASAL SEPTUM SURGERY    . prolapsed bladder    . RHINOPLASTY    . TOTAL HIP ARTHROPLASTY Right 04/11/2018   Procedure: RIGHT TOTAL HIP ARTHROPLASTY ANTERIOR APPROACH;  Surgeon: Mcarthur Rossetti, MD;  Location: [facility]S;  Service: Orthopedics;  Laterality: Right;  . tummy tuck    . ulnar nerve relocation     left    Current Medications: Current Meds  Medication Sig  . amLODipine (NORVASC) 5 MG tablet Take 1 tablet (5 mg total) by mouth daily.  Marland Kitchen aspirin-acetaminophen-caffeine (EXCEDRIN MIGRAINE) 250-250-65 MG tablet Take 2 tablets by mouth every 8 (eight) hours as needed for headache.  Marland Kitchen FLUoxetine (PROZAC) 20 MG tablet Take 1 tablet (20 mg total) by mouth daily.  . fluticasone (FLONASE) 50 MCG/ACT nasal spray Place 1 spray into both nostrils daily. (Patient taking differently: Place 1 spray into both nostrils daily as needed for allergies. )  . loperamide (IMODIUM) 2 MG capsule Take 2 mg by mouth daily. For IBS  . Mirabegron (MYRBETRIQ PO) Take 25 mg by mouth daily.  . montelukast (SINGULAIR) 10 MG tablet Take 1 tablet (10 mg total) by mouth at bedtime. (Patient taking differently: Take 10 mg by mouth daily. )     Allergies:    Codeine and Sulfa antibiotics   Social History   Socioeconomic History  . Marital status: Married    Spouse name: Not on file  . Number of children: Not on file  . Years of education: Not on file  . Highest education level: Not on file  Occupational History  . Occupation: Product manager: [employer]  Social Needs  . Financial resource strain: Not on file  . Food insecurity:    Worry: Not on file    Inability: Not on file  . Transportation needs:    Medical: Not on file    Non-medical: Not on file  Tobacco Use  . Smoking status: Never Smoker  . Smokeless tobacco: Never Used  Substance and Sexual Activity  . Alcohol use: No    Frequency: Never  . Drug use: No  . Sexual activity: Yes  Lifestyle  . Physical activity:    Days per week: Not on file    Minutes per session: Not on file  . Stress: Not on file  Relationships  . Social connections:    Talks on phone: Not on file    Gets together: Not on file    Attends religious service: Not on file    Active member of club or organization: Not on file    Attends meetings of clubs or organizations: Not on file    Relationship status: Not on file  Other Topics Concern  . Not on file  Social History Narrative  . Not on file     Family History: The patient's family history includes Cancer in her father; Heart disease in her father; Hypertension in her mother.  ROS:   Review of Systems  Constitution: Negative.  HENT: Negative.   Eyes: Negative.   Cardiovascular: Negative.   Respiratory: Positive for shortness of breath and snoring (known sleep apnea CPAP intolerant).   Endocrine: Negative.   Hematologic/Lymphatic: Negative.   Skin: Negative.   Musculoskeletal: Positive for joint pain.  Gastrointestinal: Negative.   Genitourinary: Negative.   Neurological: Negative.   Psychiatric/Behavioral: Negative.   Allergic/Immunologic: Negative.    Please see the history of present illness.     All other  systems reviewed and are negative.  EKGs/Labs/Other Studies Reviewed:    The following studies were reviewed today:   EKG 07/19/1(: [place] normal Recent Labs: 06/26/2017: TSH 2.62 04/25/2018: ALT 36; BUN 20; Creatinine, Ser 1.08; Hemoglobin 14.8; Platelets 419.0; Potassium 3.5; Sodium 138  Recent Lipid Panel    Component Value Date/Time   CHOL 246 (H) 10/14/2017 0851   TRIG 113.0 10/14/2017 0851   HDL 68.10 10/14/2017 0851   CHOLHDL 4 10/14/2017 0851   VLDL 22.6 10/14/2017 0851   LDLCALC 155 (H) 10/14/2017 0851    Physical Exam:    VS:  BP 110/88 (BP Location: Left Arm, Patient Position: Sitting, Cuff Size: Normal)   Pulse 85   Ht 5' 3"  (1.6 m)   Wt 170 lb (77.1 kg)   SpO2 98%   BMI 30.11 kg/m     Wt Readings from Last 3 Encounters:  05/14/18 170 lb (77.1 kg)  04/25/18 167 lb 6.4 oz (75.9 kg)  04/11/18 172 lb (78 kg)     GEN:  Well nourished, well developed in no acute distress HEENT: Normal NECK: No JVD; No carotid bruits LYMPHATICS: No lymphadenopathy CARDIAC: RRR, no murmurs, rubs, gallops RESPIRATORY:  Clear to auscultation without rales, wheezing or rhonchi  ABDOMEN: Soft, non-tender, non-distended MUSCULOSKELETAL:  No edema; No deformity  SKIN: Warm and dry NEUROLOGIC:  Alert and oriented x 3 PSYCHIATRIC:  Normal affect     Signed, Shirlee More, MD  05/14/2018 12:40 PM    [facility]

## 2018-05-14 ENCOUNTER — Encounter: Payer: Self-pay | Admitting: Cardiology

## 2018-05-14 ENCOUNTER — Ambulatory Visit (INDEPENDENT_AMBULATORY_CARE_PROVIDER_SITE_OTHER): Payer: Medicare Other | Admitting: Cardiology

## 2018-05-14 VITALS — BP 110/88 | HR 85 | Ht 63.0 in | Wt 170.0 lb

## 2018-05-14 DIAGNOSIS — E876 Hypokalemia: Secondary | ICD-10-CM | POA: Diagnosis not present

## 2018-05-14 DIAGNOSIS — R0602 Shortness of breath: Secondary | ICD-10-CM | POA: Diagnosis not present

## 2018-05-14 DIAGNOSIS — I10 Essential (primary) hypertension: Secondary | ICD-10-CM

## 2018-05-14 DIAGNOSIS — R0609 Other forms of dyspnea: Secondary | ICD-10-CM | POA: Insufficient documentation

## 2018-05-14 DIAGNOSIS — G4733 Obstructive sleep apnea (adult) (pediatric): Secondary | ICD-10-CM

## 2018-05-14 LAB — BASIC METABOLIC PANEL
BUN/Creatinine Ratio: 17 (ref 12–28)
BUN: 16 mg/dL (ref 8–27)
CO2: 24 mmol/L (ref 20–29)
CREATININE: 0.96 mg/dL (ref 0.57–1.00)
Calcium: 9.7 mg/dL (ref 8.7–10.3)
Chloride: 103 mmol/L (ref 96–106)
GFR calc Af Amer: 72 mL/min/{1.73_m2} (ref >59)
GFR, EST NON AFRICAN AMERICAN: 62 mL/min/{1.73_m2} (ref >59)
Glucose: 108 mg/dL — ABNORMAL HIGH (ref 65–99)
Potassium: 3.8 mmol/L (ref 3.5–5.2)
SODIUM: 143 mmol/L (ref 134–144)

## 2018-05-14 LAB — MAGNESIUM: Magnesium: 2.5 mg/dL — ABNORMAL HIGH (ref 1.6–2.3)

## 2018-05-14 LAB — PRO B NATRIURETIC PEPTIDE: NT-Pro BNP: 70 pg/mL (ref 0–301)

## 2018-05-14 MED ORDER — METOPROLOL SUCCINATE ER 25 MG PO TB24: 25.0000 mg | ORAL_TABLET | Freq: Every day | ORAL | 2 refills | Status: DC

## 2018-05-14 NOTE — Patient Instructions (Signed)
Medication Instructions:  Your physician has recommended you make the following change in your medication:  START: Metoprolol succinate 25 mg daily.   Labwork: Your physician recommends that you return for lab work today: Pro BNP, BMP, and magnesium.   Testing/Procedures: Your physician has requested that you have an echocardiogram. Echocardiography is a painless test that uses sound waves to create images of your heart. It provides your doctor with information about the size and shape of your heart and how well your heart's chambers and valves are working. This procedure takes approximately one hour. There are no restrictions for this procedure.    Follow-Up: Your physician recommends that you schedule a follow-up appointment in: 6 weeks.    Any Other Special Instructions Will Be Listed Below (If Applicable).     If you need a refill on your cardiac medications before your next appointment, please call your pharmacy.  Echocardiogram An echocardiogram, or echocardiography, uses sound waves (ultrasound) to produce an image of your heart. The echocardiogram is simple, painless, obtained within a short period of time, and offers valuable information to your health care provider. The images from an echocardiogram can provide information such as:  Evidence of coronary artery disease (CAD).  Heart size.  Heart muscle function.  Heart valve function.  Aneurysm detection.  Evidence of a past heart attack.  Fluid buildup around the heart.  Heart muscle thickening.  Assess heart valve function.  Tell a health care provider about:  Any allergies you have.  All medicines you are taking, including vitamins, herbs, eye drops, creams, and over-the-counter medicines.  Any problems you or family members have had with anesthetic medicines.  Any blood disorders you have.  Any surgeries you have had.  Any medical conditions you have.  Whether you are pregnant or may be  pregnant. What happens before the procedure? No special preparation is needed. Eat and drink normally. What happens during the procedure?  In order to produce an image of your heart, gel will be applied to your chest and a wand-like tool (transducer) will be moved over your chest. The gel will help transmit the sound waves from the transducer. The sound waves will harmlessly bounce off your heart to allow the heart images to be captured in real-time motion. These images will then be recorded.  You may need an IV to receive a medicine that improves the quality of the pictures. What happens after the procedure? You may return to your normal schedule including diet, activities, and medicines, unless your health care provider tells you otherwise. This information is not intended to replace advice given to you by your health care provider. Make sure you discuss any questions you have with your health care provider. Document Released: 09/21/2000 Document Revised: 05/12/2016 Document Reviewed: 06/01/2013 Elsevier Interactive Patient Education  2017 [place]. Metoprolol extended-release tablets What is this medicine? METOPROLOL (me TOE proe lole) is a beta-blocker. Beta-blockers reduce the workload on the heart and help it to beat more regularly. This medicine is used to treat high blood pressure and to prevent chest pain. It is also used to after a heart attack and to prevent an additional heart attack from occurring. This medicine may be used for other purposes; ask your health care provider or pharmacist if you have questions. COMMON BRAND NAME(S): toprol, Toprol XL What should I tell my health care provider before I take this medicine? They need to know if you have any of these conditions: -diabetes -heart or vessel disease like  slow heart rate, worsening heart failure, heart block, sick sinus syndrome or Raynaud's disease -kidney disease -liver disease -lung or breathing disease, like  asthma or emphysema -pheochromocytoma -thyroid disease -an unusual or allergic reaction to metoprolol, other beta-blockers, medicines, foods, dyes, or preservatives -pregnant or trying to get pregnant -breast-feeding How should I use this medicine? Take this medicine by mouth with a glass of water. Follow the directions on the prescription label. Do not crush or chew. Take this medicine with or immediately after meals. Take your doses at regular intervals. Do not take more medicine than directed. Do not stop taking this medicine suddenly. This could lead to serious heart-related effects. Talk to your pediatrician regarding the use of this medicine in children. While this drug may be prescribed for children as young as 6 years for selected conditions, precautions do apply. Overdosage: If you think you have taken too much of this medicine contact a poison control center or emergency room at once. NOTE: This medicine is only for you. Do not share this medicine with others. What if I miss a dose? If you miss a dose, take it as soon as you can. If it is almost time for your next dose, take only that dose. Do not take double or extra doses. What may interact with this medicine? This medicine may interact with the following medications: -certain medicines for blood pressure, heart disease, irregular heart beat -certain medicines for depression, like monoamine oxidase (MAO) inhibitors, fluoxetine, or paroxetine -clonidine -dobutamine -epinephrine -isoproterenol -reserpine This list may not describe all possible interactions. Give your health care provider a list of all the medicines, herbs, non-prescription drugs, or dietary supplements you use. Also tell them if you smoke, drink alcohol, or use illegal drugs. Some items may interact with your medicine. What should I watch for while using this medicine? Visit your doctor or health care professional for regular check ups. Contact your doctor right  away if your symptoms worsen. Check your blood pressure and pulse rate regularly. Ask your health care professional what your blood pressure and pulse rate should be, and when you should contact them. You may get drowsy or dizzy. Do not drive, use machinery, or do anything that needs mental alertness until you know how this medicine affects you. Do not sit or stand up quickly, especially if you are an older patient. This reduces the risk of dizzy or fainting spells. Contact your doctor if these symptoms continue. Alcohol may interfere with the effect of this medicine. Avoid alcoholic drinks. What side effects may I notice from receiving this medicine? Side effects that you should report to your doctor or health care professional as soon as possible: -allergic reactions like skin rash, itching or hives -cold or numb hands or feet -depression -difficulty breathing -faint -fever with sore throat -irregular heartbeat, chest pain -rapid weight gain -swollen legs or ankles Side effects that usually do not require medical attention (report to your doctor or health care professional if they continue or are bothersome): -anxiety or nervousness -change in sex drive or performance -dry skin -headache -nightmares or trouble sleeping -short term memory loss -stomach upset or diarrhea -unusually tired This list may not describe all possible side effects. Call your doctor for medical advice about side effects. You may report side effects to FDA at 1-800-FDA-1088. Where should I keep my medicine? Keep out of the reach of children. Store at room temperature between 15 and 30 degrees C (59 and 86 degrees F). Throw away any unused  medicine after the expiration date. NOTE: This sheet is a summary. It may not cover all possible information. If you have questions about this medicine, talk to your doctor, pharmacist, or health care provider.  2018 Elsevier/Gold Standard (2013-05-29 14:41:37)

## 2018-05-22 ENCOUNTER — Ambulatory Visit (INDEPENDENT_AMBULATORY_CARE_PROVIDER_SITE_OTHER): Payer: Medicare Other | Admitting: Orthopaedic Surgery

## 2018-06-02 ENCOUNTER — Encounter (INDEPENDENT_AMBULATORY_CARE_PROVIDER_SITE_OTHER): Payer: Self-pay | Admitting: Orthopaedic Surgery

## 2018-06-02 ENCOUNTER — Ambulatory Visit (INDEPENDENT_AMBULATORY_CARE_PROVIDER_SITE_OTHER): Payer: Medicare Other | Admitting: Orthopaedic Surgery

## 2018-06-02 DIAGNOSIS — Z96641 Presence of right artificial hip joint: Secondary | ICD-10-CM | POA: Diagnosis not present

## 2018-06-02 NOTE — Progress Notes (Signed)
The patient is status post a right total hip arthroplasty done about 52 days ago.  She says she is doing great and has no issues at all.  She has full range of motion strength she states.  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  On exam she moves her hip smoothly through internal and external rotation on the right side with no difficulties at all.  She has slight pain of the trochanteric area but is only minimal.  Her leg lengths appear equal.  She is walking without a limp and without an assistive device.  At this point we do not need to see her back for 6 months.  At that visit I would like a standing low AP pelvis and lateral right operative hip.  She has any issues prior to then she will let us know.  All questions concerns were answered and addressed today.

## 2018-06-12 DIAGNOSIS — N3946 Mixed incontinence: Secondary | ICD-10-CM | POA: Diagnosis not present

## 2018-06-12 DIAGNOSIS — N3942 Incontinence without sensory awareness: Secondary | ICD-10-CM | POA: Diagnosis not present

## 2018-06-17 ENCOUNTER — Encounter: Payer: Self-pay | Admitting: Family Medicine

## 2018-06-24 ENCOUNTER — Ambulatory Visit: Payer: TRICARE For Life (TFL) | Admitting: Family Medicine

## 2018-06-24 DIAGNOSIS — R05 Cough: Secondary | ICD-10-CM | POA: Diagnosis not present

## 2018-06-24 DIAGNOSIS — J069 Acute upper respiratory infection, unspecified: Secondary | ICD-10-CM | POA: Diagnosis not present

## 2018-06-26 ENCOUNTER — Other Ambulatory Visit: Payer: Self-pay

## 2018-06-26 ENCOUNTER — Ambulatory Visit (INDEPENDENT_AMBULATORY_CARE_PROVIDER_SITE_OTHER): Payer: Medicare Other

## 2018-06-26 DIAGNOSIS — I10 Essential (primary) hypertension: Secondary | ICD-10-CM | POA: Diagnosis not present

## 2018-06-26 DIAGNOSIS — R0602 Shortness of breath: Secondary | ICD-10-CM | POA: Diagnosis not present

## 2018-06-26 NOTE — Progress Notes (Signed)
Complete echocardiogram has been performed.  Jimmy Gilberto Streck RDCS, RVT 

## 2018-06-29 NOTE — Progress Notes (Signed)
Cardiology Office Note:    Date:  06/30/2018   ID:  Margaret White, DOB Oct 03, 1951, MRN [id number]  PCP:  Briscoe Deutscher, DO  Cardiologist:  Shirlee More, MD    Referring MD: Briscoe Deutscher, DO    ASSESSMENT:    1. Essential hypertension   2. SOB (shortness of breath) on exertion   3. OSA (obstructive sleep apnea)   4. Hypokalemia    PLAN:    In order of problems listed above:  1. Well-controlled on her current medical regimen including her long-acting calcium channel blocker and low-dose beta-blocker.  Home blood pressures are 323-557 systolic she has a mild degree of intermittent ankle edema it is proved to be bothersome we could use a lower dose every other day or change treatment.  At this time she wants to continue her current medical regimen 2. Improved she thinks much of this was a consequence of her surgery and process of recovery 3. Unchanged 4. Improved she is off her diuretic.   Next appointment: As needed   Medication Adjustments/Labs and Tests Ordered: Current medicines are reviewed at length with the patient today.  Concerns regarding medicines are outlined above.  No orders of the defined types were placed in this encounter.  No orders of the defined types were placed in this encounter.   Chief Complaint  Patient presents with  . Hypertension    History of Present Illness:    Margaret White is a 67 y.o. female with a hx of obstructive sleep apnea CPAP intolerant with chronic exertional shortness of breath as well as hypertension with very variable and labile measurements since right total hip arthroplasty 04/11/2018.  She was last seen 05/14/2018.  Her echocardiogram shows normal left ventricular systolic function and diastolic indices do not suggest heart failure and there is mild mitral regurgitation.  Her BNP level was low not indicating heart failure and BNP and magnesium were normal and she has had hypokalemia with thiazide diuretics.  She was  started on low-dose beta-blocker at the time of her last visit. Compliance with diet, lifestyle and medications: yes  She is recovered feels well no palpitation chest pain shortness of breath has minimal ankle edema intermittently related to calcium channel blocker.  Her BNP level echocardiogram showed no evidence of heart failure. Past Medical History:  Diagnosis Date  . Abnormal LFTs 10/14/2017  . Allergic rhinitis 10/14/2017  . Anxiety   . Avascular necrosis ([place]) 03/24/2018  . Avascular necrosis of hip, right ([place]) 03/24/2018  . Chronic maxillary sinusitis 10/14/2017  . Essential (primary) hypertension 06/26/2017  . Essential hypertension 06/26/2017   Last Assessment & Plan:  Low sodium diet  Check labs and monitor  Continue meds  . Female cystocele 10/14/2017  . Fibromyalgia 10/14/2017  . GERD (gastroesophageal reflux disease) 10/14/2017  . Headache    cluster HA  . Hepatic steatosis 10/14/2017  . 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 History of cluster headache 10/14/2017  . IBS (irritable bowel syndrome) 10/14/2017  . 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 OSA (obstructive sleep apnea) 10/14/2017  . 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 Pneumonia   . Postmenopausal 10/14/2017  . Primary osteoarthritis of both hands 07/10/2017  . Recurrent major depressive disorder, in full remission ([place]) 06/26/2017  .  RLS (restless legs syndrome) 10/14/2017   not diagnosed  . Seizures ([place])    2006  x1  . 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 Shallow disc bulging without significant neuroforaminal stenosis.    Past Surgical History:  Procedure Laterality Date  . atopic pregnancy      . BREAST BIOPSY Right    approximately 10 years ago/ benign    . BREAST BIOPSY Right   . CARPAL TUNNEL RELEASE     bil  . EYE SURGERY     as a child eyes went out, Bil cataracts 2019  . JOINT REPLACEMENT     Right total hip Dr. Zollie Beckers  . LAPAROSCOPIC TOTAL HYSTERECTOMY    . NASAL SEPTUM SURGERY    . prolapsed bladder    . RHINOPLASTY    . TOTAL HIP ARTHROPLASTY Right 04/11/2018   Procedure: RIGHT TOTAL HIP ARTHROPLASTY ANTERIOR APPROACH;  Surgeon: Mcarthur Rossetti, MD;  Location: [facility];  Service: Orthopedics;  Laterality: Right;  . tummy tuck    . ulnar nerve relocation     left    Current Medications: Current Meds  Medication Sig  . amLODipine (NORVASC) 5 MG tablet Take 1 tablet (5 mg total) by mouth daily.  Marland Kitchen aspirin-acetaminophen-caffeine (EXCEDRIN MIGRAINE) 250-250-65 MG tablet Take 2 tablets by mouth every 8 (eight) hours as needed for headache.  Marland Kitchen FLUoxetine (PROZAC) 20 MG tablet Take 1 tablet (20 mg total) by mouth daily.  . fluticasone (FLONASE) 50 MCG/ACT nasal spray Place 1 spray into both nostrils daily. (Patient taking differently: Place 1 spray into both nostrils daily as needed for allergies. )  . loperamide (IMODIUM) 2 MG capsule Take 2 mg by mouth daily. For IBS  . metoprolol succinate (TOPROL XL) 25 MG 24 hr tablet Take 1 tablet (25 mg total) by mouth daily.  . Mirabegron (MYRBETRIQ PO) Take 25 mg by mouth daily.  . montelukast (SINGULAIR) 10 MG tablet Take 1 tablet (10 mg total) by mouth at bedtime. (Patient taking differently: Take 10 mg by mouth daily. )     Allergies:   Codeine and Sulfa antibiotics   Social History   Socioeconomic History  . Marital status: Married    Spouse name: Not on file  . Number of children: Not on file  . Years of education: Not on file  . Highest education level: Not on file  Occupational History  . Occupation: Product manager: [employer]  Social Needs  . Financial resource strain: Not on  file  . Food insecurity:    Worry: Not on file    Inability: Not on file  . Transportation needs:    Medical: Not on file    Non-medical: Not on file  Tobacco Use  . Smoking status: Never Smoker  . Smokeless tobacco: Never Used  Substance and Sexual Activity  . Alcohol use: No    Frequency: Never  . Drug use: No  . Sexual activity: Yes  Lifestyle  . Physical activity:    Days per week: Not on file    Minutes per session: Not on file  . Stress: Not on file  Relationships  . Social connections:    Talks on phone: Not on file    Gets together: Not on file    Attends religious service: Not on file    Active member of club or organization: Not  on file    Attends meetings of clubs or organizations: Not on file    Relationship status: Not on file  Other Topics Concern  . Not on file  Social History Narrative  . Not on file     Family History: The patient's family history includes Cancer in her father; Heart disease in her father; Hypertension in her mother. ROS:   Please see the history of present illness.    All other systems reviewed and are negative.  EKGs/Labs/Other Studies Reviewed:    The following studies were reviewed today:   Recent Labs: 04/25/2018: ALT 36; Hemoglobin 14.8; Platelets 419.0 05/14/2018: BUN 16; Creatinine, Ser 0.96; Magnesium 2.5; NT-Pro BNP 70; Potassium 3.8; Sodium 143  Recent Lipid Panel    Component Value Date/Time   CHOL 246 (H) 10/14/2017 0851   TRIG 113.0 10/14/2017 0851   HDL 68.10 10/14/2017 0851   CHOLHDL 4 10/14/2017 0851   VLDL 22.6 10/14/2017 0851   LDLCALC 155 (H) 10/14/2017 0851    Physical Exam:    VS:  BP 118/80 (BP Location: Left Arm, Patient Position: Sitting, Cuff Size: Normal)   Pulse (!) 58   Ht 5' 3"  (1.6 m)   Wt 171 lb 9.6 oz (77.8 kg)   SpO2 96%   BMI 30.40 kg/m     Wt Readings from Last 3 Encounters:  06/30/18 171 lb 9.6 oz (77.8 kg)  05/14/18 170 lb (77.1 kg)  04/25/18 167 lb 6.4 oz (75.9 kg)     GEN:   Well nourished, well developed in no acute distress HEENT: Normal NECK: No JVD; No carotid bruits LYMPHATICS: No lymphadenopathy CARDIAC: RRR, no murmurs, rubs, gallops RESPIRATORY:  Clear to auscultation without rales, wheezing or rhonchi  ABDOMEN: Soft, non-tender, non-distended MUSCULOSKELETAL:  No edema; No deformity  SKIN: Warm and dry NEUROLOGIC:  Alert and oriented x 3 PSYCHIATRIC:  Normal affect    Signed, Shirlee More, MD  06/30/2018 4:54 PM    [facility]

## 2018-06-30 ENCOUNTER — Encounter: Payer: Self-pay | Admitting: Cardiology

## 2018-06-30 ENCOUNTER — Ambulatory Visit (INDEPENDENT_AMBULATORY_CARE_PROVIDER_SITE_OTHER): Payer: Medicare Other | Admitting: Cardiology

## 2018-06-30 VITALS — BP 118/80 | HR 58 | Ht 63.0 in | Wt 171.6 lb

## 2018-06-30 DIAGNOSIS — I10 Essential (primary) hypertension: Secondary | ICD-10-CM | POA: Diagnosis not present

## 2018-06-30 DIAGNOSIS — G4733 Obstructive sleep apnea (adult) (pediatric): Secondary | ICD-10-CM | POA: Diagnosis not present

## 2018-06-30 DIAGNOSIS — E876 Hypokalemia: Secondary | ICD-10-CM | POA: Diagnosis not present

## 2018-06-30 DIAGNOSIS — R0602 Shortness of breath: Secondary | ICD-10-CM | POA: Diagnosis not present

## 2018-06-30 NOTE — Patient Instructions (Signed)
Medication Instructions:  Your physician recommends that you continue on your current medications as directed. Please refer to the Current Medication list given to you today.  Labwork: None  Testing/Procedures: None  Follow-Up: Your physician recommends that you schedule a follow-up appointment as needed if symptoms worsen or fail to improve.   If you need a refill on your cardiac medications before your next appointment, please call your pharmacy.   Thank you for choosing [facility]! Robyne Peers, RN [phone]

## 2018-07-30 ENCOUNTER — Encounter: Payer: Self-pay | Admitting: Family Medicine

## 2018-07-30 ENCOUNTER — Ambulatory Visit (INDEPENDENT_AMBULATORY_CARE_PROVIDER_SITE_OTHER): Payer: Medicare Other | Admitting: Family Medicine

## 2018-07-30 VITALS — BP 124/86 | HR 77 | Temp 98.3°F | Ht 63.0 in | Wt 177.6 lb

## 2018-07-30 DIAGNOSIS — R739 Hyperglycemia, unspecified: Secondary | ICD-10-CM

## 2018-07-30 DIAGNOSIS — I10 Essential (primary) hypertension: Secondary | ICD-10-CM | POA: Diagnosis not present

## 2018-07-30 DIAGNOSIS — K760 Fatty (change of) liver, not elsewhere classified: Secondary | ICD-10-CM | POA: Diagnosis not present

## 2018-07-30 DIAGNOSIS — E8881 Metabolic syndrome: Secondary | ICD-10-CM | POA: Diagnosis not present

## 2018-07-30 DIAGNOSIS — E88819 Insulin resistance, unspecified: Secondary | ICD-10-CM

## 2018-07-30 NOTE — Progress Notes (Signed)
Margaret White is a 67 y.o. female is here for follow up.  History of Present Illness:   HPI:   1. Essential hypertension.   Review: taking medications as instructed, no medication side effects noted, no TIAs, no chest pain on exertion, no dyspnea on exertion, no swelling of ankles.   Smoker: No.  Wt Readings from Last 3 Encounters:  07/30/18 177 lb 9.6 oz (80.6 kg)  06/30/18 171 lb 9.6 oz (77.8 kg)  05/14/18 170 lb (77.1 kg)   BP Readings from Last 3 Encounters:  07/30/18 124/86  06/30/18 118/80  05/14/18 110/88   Lab Results  Component Value Date   CREATININE 0.96 05/14/2018     2. Hepatic steatosis.   Lab Results  Component Value Date   CHOL 246 (H) 10/14/2017   HDL 68.10 10/14/2017   LDLCALC 155 (H) 10/14/2017   TRIG 113.0 10/14/2017   CHOLHDL 4 10/14/2017   Lab Results  Component Value Date   ALT 36 (H) 04/25/2018   AST 23 04/25/2018   ALKPHOS 92 04/25/2018   BILITOT 0.5 04/25/2018     3. Insulin resistance.   Current symptoms: no polyuria or polydipsia, no chest pain, dyspnea or TIA's, no numbness, tingling or pain in extremities.   Lab Results  Component Value Date   HGBA1C 5.7 (A) 03/24/2018      There are no preventive care reminders to display for this patient.   Depression screen [facility] 2/9 03/24/2018 11/19/2017 10/14/2017  Decreased Interest 3 0 0  Down, Depressed, Hopeless 0 0 0  PHQ - 2 Score 3 0 0  Altered sleeping 1 - 1  Tired, decreased energy 3 - 3  Change in appetite 0 - 0  Feeling bad or failure about yourself  0 - 0  Trouble concentrating 0 - 0  Moving slowly or fidgety/restless 0 - 0  Suicidal thoughts 0 - 0  PHQ-9 Score 7 - 4  Difficult doing work/chores Not difficult at all - -   PMHx, SurgHx, SocialHx, FamHx, Medications, and Allergies were reviewed in the Visit Navigator and updated as appropriate.   Patient Active Problem List   Diagnosis Date Noted  . SOB (shortness of breath) on exertion 05/14/2018  . Hypokalemia  05/14/2018  . Incontinence without sensory awareness 05/08/2018  . Mixed incontinence 05/08/2018  . 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 Statin intolerance 03/25/2018  . 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 Avascular necrosis ([place]) 03/24/2018  . Avascular necrosis of hip, right ([place]) 03/24/2018  . 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 GERD (gastroesophageal reflux disease) 10/14/2017  . Postmenopausal 10/14/2017  . Allergic rhinitis 10/14/2017  . Abnormal LFTs 10/14/2017  . Hepatic steatosis 10/14/2017  . OSA (obstructive sleep apnea) 10/14/2017  . RLS (restless legs syndrome) 10/14/2017  . History of cluster headache 10/14/2017  . IBS (irritable bowel syndrome) 10/14/2017  . Fibromyalgia 10/14/2017  . Chronic maxillary sinusitis 10/14/2017  . Pelvic pain 10/14/2017  . Obesity (BMI 30-39.9) 10/14/2017  . Mixed hyperlipidemia 10/14/2017  . Vitamin B12 deficiency 10/14/2017  . Vitamin D deficiency 10/14/2017  . Female cystocele 10/14/2017  . Primary osteoarthritis of both hands 07/10/2017  . Essential hypertension 06/26/2017  . Recurrent major depressive disorder, in full remission ([place]) 06/26/2017   Social History   Tobacco Use  . Smoking status: Never Smoker  . Smokeless tobacco: Never  Used  Substance Use Topics  . Alcohol use: No    Frequency: Never  . Drug use: No   Current Medications and Allergies:   .  amLODipine (NORVASC) 5 MG tablet, Take 1 tablet (5 mg total) by mouth daily., Disp: 90 tablet, Rfl: 2 .  aspirin-acetaminophen-caffeine (EXCEDRIN MIGRAINE) 250-250-65 MG tablet, Take 2 tablets by mouth every 8 (eight) hours as needed for headache., Disp: , Rfl:  .  FLUoxetine (PROZAC) 20 MG tablet, Take 1 tablet (20 mg total) by mouth daily., Disp: 90 tablet, Rfl: 1 .  fluticasone (FLONASE) 50 MCG/ACT nasal spray,  Place 1 spray into both nostrils daily. (Patient taking differently: Place 1 spray into both nostrils daily as needed for allergies. ), Disp: 16 g, Rfl: 3 .  loperamide (IMODIUM) 2 MG capsule, Take 2 mg by mouth daily. For IBS, Disp: , Rfl:  .  metoprolol succinate (TOPROL XL) 25 MG 24 hr tablet, Take 1 tablet (25 mg total) by mouth daily., Disp: 90 tablet, Rfl: 2 .  Mirabegron (MYRBETRIQ PO), Take 25 mg by mouth daily., Disp: , Rfl:  .  montelukast (SINGULAIR) 10 MG tablet, Take 1 tablet (10 mg total) by mouth at bedtime. (Patient taking differently: Take 10 mg by mouth daily. ), Disp: 90 tablet, Rfl: 3   Allergies  Allergen Reactions  . Codeine Itching  . Sulfa Antibiotics Rash    Flu like symptoms   Review of Systems   Pertinent items are noted in the HPI. Otherwise, ROS is negative.  Vitals:   Vitals:   07/30/18 1543  BP: 124/86  Pulse: 77  Temp: 98.3 F (36.8 C)  TempSrc: Oral  SpO2: 95%  Weight: 177 lb 9.6 oz (80.6 kg)  Height: 5' 3"  (1.6 m)     Body mass index is 31.46 kg/m.  Physical Exam:   Physical Exam  Constitutional: She appears well-nourished.  HENT:  Head: Normocephalic and atraumatic.  Eyes: Pupils are equal, round, and reactive to light. EOM are normal.  Neck: Normal range of motion. Neck supple.  Cardiovascular: Normal rate, regular rhythm, normal heart sounds and intact distal pulses.  Pulmonary/Chest: Effort normal.  Abdominal: Soft.  Skin: Skin is warm.  Psychiatric: She has a normal mood and affect. Her behavior is normal.  Nursing note and vitals reviewed.  Assessment and Plan:   Margaret White was seen today for follow-up.  Diagnoses and all orders for this visit:  Essential hypertension  Hepatic steatosis -     Comprehensive metabolic panel -     Lipid panel  Insulin resistance -     Hemoglobin A1c  Hyperglycemia -     Hemoglobin A1c   . Reviewed expectations re: course of current medical issues. . Discussed self-management of  symptoms. . Outlined signs and symptoms indicating need for more acute intervention. . Patient verbalized understanding and all questions were answered. Marland Kitchen Health Maintenance issues including appropriate healthy diet, exercise, and smoking avoidance were discussed with patient. . See orders for this visit as documented in the electronic medical record. . Patient received an After Visit Summary.  Briscoe Deutscher, DO [place] 07/31/2018

## 2018-07-31 LAB — COMPREHENSIVE METABOLIC PANEL
ALT: 27 U/L (ref 0–35)
AST: 18 U/L (ref 0–37)
Albumin: 4 g/dL (ref 3.5–5.2)
Alkaline Phosphatase: 76 U/L (ref 39–117)
BUN: 21 mg/dL (ref 6–23)
CO2: 29 mEq/L (ref 19–32)
Calcium: 9.3 mg/dL (ref 8.4–10.5)
Chloride: 101 mEq/L (ref 96–112)
Creatinine, Ser: 1.19 mg/dL (ref 0.40–1.20)
GFR: 48.25 mL/min — ABNORMAL LOW (ref >60.00)
Glucose, Bld: 86 mg/dL (ref 70–99)
Potassium: 4 mEq/L (ref 3.5–5.1)
Sodium: 138 mEq/L (ref 135–145)
Total Bilirubin: 0.3 mg/dL (ref 0.2–1.2)
Total Protein: 7.1 g/dL (ref 6.0–8.3)

## 2018-07-31 LAB — LIPID PANEL
Cholesterol: 203 mg/dL — ABNORMAL HIGH (ref 0–200)
HDL: 65.9 mg/dL (ref >39.00)
LDL Cholesterol: 108 mg/dL — ABNORMAL HIGH (ref 0–99)
NonHDL: 136.93
Total CHOL/HDL Ratio: 3
Triglycerides: 147 mg/dL (ref 0.0–149.0)
VLDL: 29.4 mg/dL (ref 0.0–40.0)

## 2018-07-31 LAB — HEMOGLOBIN A1C: Hgb A1c MFr Bld: 6 % (ref 4.6–6.5)

## 2018-08-04 ENCOUNTER — Encounter (INDEPENDENT_AMBULATORY_CARE_PROVIDER_SITE_OTHER): Payer: Self-pay | Admitting: Orthopaedic Surgery

## 2018-08-08 ENCOUNTER — Encounter (HOSPITAL_COMMUNITY): Payer: Self-pay

## 2018-08-08 ENCOUNTER — Emergency Department (HOSPITAL_COMMUNITY): Payer: Medicare Other

## 2018-08-08 ENCOUNTER — Other Ambulatory Visit: Payer: Self-pay

## 2018-08-08 ENCOUNTER — Emergency Department (HOSPITAL_COMMUNITY)
Admission: EM | Admit: 2018-08-08 | Discharge: 2018-08-08 | Disposition: A | Discharge status: Home / Self Care | Payer: Medicare Other | Attending: Emergency Medicine | Admitting: Emergency Medicine

## 2018-08-08 DIAGNOSIS — Z79899 Other long term (current) drug therapy: Secondary | ICD-10-CM | POA: Diagnosis not present

## 2018-08-08 DIAGNOSIS — M25551 Pain in right hip: Secondary | ICD-10-CM | POA: Diagnosis not present

## 2018-08-08 DIAGNOSIS — Z96641 Presence of right artificial hip joint: Secondary | ICD-10-CM | POA: Diagnosis not present

## 2018-08-08 DIAGNOSIS — I10 Essential (primary) hypertension: Secondary | ICD-10-CM | POA: Insufficient documentation

## 2018-08-08 DIAGNOSIS — Z471 Aftercare following joint replacement surgery: Secondary | ICD-10-CM | POA: Diagnosis not present

## 2018-08-08 LAB — BASIC METABOLIC PANEL
Anion gap: 8 (ref 5–15)
BUN: 16 mg/dL (ref 8–23)
CO2: 30 mmol/L (ref 22–32)
Calcium: 8.8 mg/dL — ABNORMAL LOW (ref 8.9–10.3)
Chloride: 103 mmol/L (ref 98–111)
Creatinine, Ser: 1.06 mg/dL — ABNORMAL HIGH (ref 0.44–1.00)
GFR calc Af Amer: >60 mL/min (ref >60)
GFR calc non Af Amer: 54 mL/min — ABNORMAL LOW (ref >60)
Glucose, Bld: 96 mg/dL (ref 70–99)
Potassium: 3.6 mmol/L (ref 3.5–5.1)
Sodium: 141 mmol/L (ref 135–145)

## 2018-08-08 LAB — CBC WITH DIFFERENTIAL/PLATELET
Abs Immature Granulocytes: 0.02 10*3/uL (ref 0.00–0.07)
Basophils Absolute: 0 10*3/uL (ref 0.0–0.1)
Basophils Relative: 1 %
Eosinophils Absolute: 0.2 10*3/uL (ref 0.0–0.5)
Eosinophils Relative: 2 %
HCT: 41.2 % (ref 36.0–46.0)
Hemoglobin: 12.6 g/dL (ref 12.0–15.0)
Immature Granulocytes: 0 %
Lymphocytes Relative: 29 %
Lymphs Abs: 1.9 10*3/uL (ref 0.7–4.0)
MCH: 28.3 pg (ref 26.0–34.0)
MCHC: 30.6 g/dL (ref 30.0–36.0)
MCV: 92.4 fL (ref 80.0–100.0)
Monocytes Absolute: 0.5 10*3/uL (ref 0.1–1.0)
Monocytes Relative: 8 %
Neutro Abs: 3.9 10*3/uL (ref 1.7–7.7)
Neutrophils Relative %: 60 %
Platelets: 279 10*3/uL (ref 150–400)
RBC: 4.46 MIL/uL (ref 3.87–5.11)
RDW: 14.7 % (ref 11.5–15.5)
WBC: 6.5 10*3/uL (ref 4.0–10.5)
nRBC: 0 % (ref 0.0–0.2)

## 2018-08-08 MED ORDER — PREDNISONE 50 MG PO TABS: 50.0000 mg | ORAL_TABLET | Freq: Every day | ORAL | 0 refills | Status: DC

## 2018-08-08 MED ORDER — KETOROLAC TROMETHAMINE 30 MG/ML IJ SOLN
15.0000 mg | Freq: Once | INTRAMUSCULAR | Status: AC
  Administered 2018-08-08: 15 mg via INTRAVENOUS
  Filled 2018-08-08: qty 1

## 2018-08-08 MED ORDER — IOPAMIDOL (ISOVUE-300) INJECTION 61%
100.0000 mL | Freq: Once | INTRAVENOUS | Status: AC | PRN
  Administered 2018-08-08: 100 mL via INTRAVENOUS

## 2018-08-08 MED ORDER — SODIUM CHLORIDE 0.9 % IJ SOLN
INTRAMUSCULAR | Status: AC
  Filled 2018-08-08: qty 50

## 2018-08-08 MED ORDER — KETOROLAC TROMETHAMINE 30 MG/ML IJ SOLN: 30.0000 mg | Freq: Once | INTRAMUSCULAR | Status: DC

## 2018-08-08 MED ORDER — MORPHINE SULFATE (PF) 4 MG/ML IV SOLN
4.0000 mg | Freq: Once | INTRAVENOUS | Status: AC
  Administered 2018-08-08: 4 mg via INTRAVENOUS
  Filled 2018-08-08: qty 1

## 2018-08-08 MED ORDER — IOPAMIDOL (ISOVUE-300) INJECTION 61%
INTRAVENOUS | Status: AC
  Administered 2018-08-08: 100 mL via INTRAVENOUS
  Filled 2018-08-08: qty 100

## 2018-08-08 MED ORDER — HYDROCODONE-ACETAMINOPHEN 5-325 MG PO TABS: 1.0000 | ORAL_TABLET | Freq: Four times a day (QID) | ORAL | 0 refills | Status: DC | PRN

## 2018-08-08 NOTE — ED Triage Notes (Signed)
patient c/o right hip pain x 2 1/2 weeks. patient had a hip replacement 4 months ago. Patient reports that she has had to go back to using her walker due to an increase in the pain in the right hip.Patieanat denies any swelling, fever or discoloration of the right hip.

## 2018-08-08 NOTE — ED Notes (Signed)
Bed: [facility] Expected date:  Expected time:  Means of arrival:  Comments: 

## 2018-08-08 NOTE — Discharge Instructions (Signed)
You will need to follow-up with Dr. Ninfa Linden as soon as possible.  Return here for any worsening in your condition.  CT scan did not show any significant abnormalities at this time.

## 2018-08-08 NOTE — ED Notes (Signed)
Patient transported to CT 

## 2018-08-08 NOTE — ED Provider Notes (Signed)
[facility] DEPT Provider Note   CSN: [id number] Arrival date & time: 08/08/18  1205     History   Chief Complaint Chief Complaint  Patient presents with  . Hip Pain    HPI Margaret White is a 67 y.o. female.  HPI Patient presents to the emergency department with right hip pain that started about a day and a half ago.  Patient states that she had right hip replacement 4 months ago.  She states she has not had any issues since that time except for a few months ago had some discomfort for a few days.  She states that she attempted to speak with Dr. Ninfa Linden who was not in the office today.  The patient states that movements and palpation make the pain worse.  Patient denies any fever, nausea, vomiting, chills, weakness, dizziness, back pain, neck pain, shortness of breath, leg swelling, incontinence, or syncope. Past Medical History:  Diagnosis Date  . Abnormal LFTs 10/14/2017  . Allergic rhinitis 10/14/2017  . Anxiety   . Avascular necrosis ([place]) 03/24/2018  . Avascular necrosis of hip, right ([place]) 03/24/2018  . Chronic maxillary sinusitis 10/14/2017  . Essential (primary) hypertension 06/26/2017  . Essential hypertension 06/26/2017   Last Assessment & Plan:  Low sodium diet  Check labs and monitor  Continue meds  . Female cystocele 10/14/2017  . Fibromyalgia 10/14/2017  . GERD (gastroesophageal reflux disease) 10/14/2017  . Headache    cluster HA  . Hepatic steatosis 10/14/2017  . 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 History of cluster headache 10/14/2017  . IBS (irritable bowel syndrome) 10/14/2017  . 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 OSA (obstructive sleep apnea) 10/14/2017  . 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 Pneumonia   . Postmenopausal  10/14/2017  . Primary osteoarthritis of both hands 07/10/2017  . Recurrent major depressive disorder, in full remission ([place]) 06/26/2017  . RLS (restless legs syndrome) 10/14/2017   not diagnosed  . Seizures ([place])    2006  x1  . 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 Shallow disc bulging without significant neuroforaminal stenosis.    Patient Active Problem List   Diagnosis Date Noted  . SOB (shortness of breath) on exertion 05/14/2018  . Hypokalemia 05/14/2018  . Incontinence without sensory awareness 05/08/2018  . Mixed incontinence 05/08/2018  . 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 Statin intolerance 03/25/2018  . 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 Avascular necrosis ([place]) 03/24/2018  . Avascular necrosis of hip, right ([place]) 03/24/2018  . 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 GERD (gastroesophageal reflux disease) 10/14/2017  . Postmenopausal 10/14/2017  . Allergic rhinitis 10/14/2017  . Abnormal LFTs 10/14/2017  . Hepatic steatosis 10/14/2017  . OSA (obstructive sleep apnea) 10/14/2017  . RLS (restless legs syndrome) 10/14/2017  . History of cluster headache 10/14/2017  . IBS (irritable bowel syndrome) 10/14/2017  . Fibromyalgia 10/14/2017  . Chronic maxillary sinusitis 10/14/2017  . Pelvic pain 10/14/2017  . Obesity (BMI 30-39.9) 10/14/2017  . Mixed hyperlipidemia 10/14/2017  . Vitamin B12  deficiency 10/14/2017  . Vitamin D deficiency 10/14/2017  . Female cystocele 10/14/2017  . Primary osteoarthritis of both hands 07/10/2017  . Essential hypertension 06/26/2017  . Recurrent major depressive disorder, in full remission ([place]) 06/26/2017    Past Surgical History:  Procedure Laterality Date  . atopic  pregnancy    . BREAST BIOPSY Right    approximately 10 years ago/ benign    . BREAST BIOPSY Right   . CARPAL TUNNEL RELEASE     bil  . EYE SURGERY     as a child eyes went out, Bil cataracts 2019  . JOINT REPLACEMENT     Right total hip Dr. Zollie Beckers  . LAPAROSCOPIC TOTAL HYSTERECTOMY    . NASAL SEPTUM SURGERY    . prolapsed bladder    . RHINOPLASTY    . TOTAL HIP ARTHROPLASTY Right 04/11/2018   Procedure: RIGHT TOTAL HIP ARTHROPLASTY ANTERIOR APPROACH;  Surgeon: Mcarthur Rossetti, MD;  Location: [facility] ORS;  Service: Orthopedics;  Laterality: Right;  . tummy tuck    . ulnar nerve relocation     left     OB History   None      Home Medications    Prior to Admission medications   Medication Sig Start Date End Date Taking? Authorizing Provider  amLODipine (NORVASC) 5 MG tablet Take 1 tablet (5 mg total) by mouth daily. 05/12/18  Yes Briscoe Deutscher, DO  aspirin-acetaminophen-caffeine (EXCEDRIN MIGRAINE) [phone] MG tablet Take 2 tablets by mouth every 8 (eight) hours as needed for headache.   Yes [provider]  FLUoxetine (PROZAC) 20 MG tablet Take 1 tablet (20 mg total) by mouth daily. 03/24/18  Yes Briscoe Deutscher, DO  fluticasone (FLONASE) 50 MCG/ACT nasal spray Place 1 spray into both nostrils daily. Patient taking differently: Place 1 spray into both nostrils daily as needed for allergies.  03/24/18  Yes Briscoe Deutscher, DO  loperamide (IMODIUM) 2 MG capsule Take 2 mg by mouth daily. For IBS   Yes [provider]  loratadine (CLARITIN) 10 MG tablet Take 10 mg by mouth daily.   Yes [provider]  Mirabegron (MYRBETRIQ PO) Take 25 mg by mouth daily.   Yes [provider]  montelukast (SINGULAIR) 10 MG tablet Take 1 tablet (10 mg total) by mouth at bedtime. Patient taking differently: Take 10 mg by mouth daily.  03/24/18  Yes Briscoe Deutscher, DO  HYDROcodone-acetaminophen (NORCO/VICODIN) 5-325 MG tablet Take 1 tablet by mouth every 6  (six) hours as needed for moderate pain. 08/08/18   Evea Sheek, Harrell Gave, PA-C  predniSONE (DELTASONE) 50 MG tablet Take 1 tablet (50 mg total) by mouth daily with breakfast. 08/08/18   Dalia Heading, PA-C    Family History Family History  Problem Relation Age of Onset  . Hypertension Mother   . Cancer Father   . Heart disease Father     Social History Social History   Tobacco Use  . Smoking status: Never Smoker  . Smokeless tobacco: Never Used  Substance Use Topics  . Alcohol use: No    Frequency: Never  . Drug use: No     Allergies   Codeine and Sulfa antibiotics   Review of Systems Review of Systems  All other systems negative except as documented in the HPI. All pertinent positives and negatives as reviewed in the HPI. Physical Exam Updated Vital Signs BP 133/84   Pulse 73   Temp 97.8 F (36.6 C) (Oral)   Resp 18   Ht 5'  3" (1.6 m)   Wt 79.4 kg   SpO2 97%   BMI 31.00 kg/m   Physical Exam  Constitutional: She is oriented to person, place, and time. She appears well-developed and well-nourished. No distress.  HENT:  Head: Normocephalic and atraumatic.  Mouth/Throat: Oropharynx is clear and moist.  Eyes: Pupils are equal, round, and reactive to light.  Neck: Normal range of motion. Neck supple.  Cardiovascular: Normal rate, regular rhythm and normal heart sounds. Exam reveals no gallop and no friction rub.  No murmur heard. Pulmonary/Chest: Effort normal and breath sounds normal. No respiratory distress. She has no wheezes.  Musculoskeletal:       Right hip: She exhibits tenderness. She exhibits normal range of motion, normal strength, no bony tenderness, no swelling, no crepitus, no deformity and no laceration.       Legs: Neurological: She is alert and oriented to person, place, and time. She exhibits normal muscle tone. Coordination normal.  Skin: Skin is warm and dry. Capillary refill takes less than 2 seconds. No rash noted. No erythema.    Psychiatric: She has a normal mood and affect. Her behavior is normal.  Nursing note and vitals reviewed.    ED Treatments / Results  Labs (all labs ordered are listed, but only abnormal results are displayed) Labs Reviewed  BASIC METABOLIC PANEL - Abnormal; Notable for the following components:      Result Value   Creatinine, Ser 1.06 (*)    Calcium 8.8 (*)    GFR calc non Af Amer 54 (*)    All other components within normal limits  CBC WITH DIFFERENTIAL/PLATELET    EKG None  Radiology Ct Hip Right W Contrast  Result Date: 08/08/2018 CLINICAL DATA:  Sharp right hip pain for 2 weeks. Recent hip replacement 4 months ago. EXAM: CT OF THE LOWER RIGHT EXTREMITY WITH CONTRAST TECHNIQUE: Multidetector CT imaging of the lower right extremity was performed according to the standard protocol following intravenous contrast administration. COMPARISON:  Radiograph 04/11/2018 CONTRAST:  151m ISOVUE-300 IOPAMIDOL (ISOVUE-300) INJECTION 61% FINDINGS: The right hip prosthesis is normally located. The femoral and acetabular components are well seated. No complicating features are demonstrated. No periprosthetic fracture or evidence of loosening. No hip joint effusion or periarticular fluid collections. The surrounding hip and pelvic musculature are grossly normal by CT scan. No significant intrapelvic abnormalities are identified. No inguinal mass or inguinal hernia. Moderate pubic symphysis degenerative changes but no pubic rami fractures. IMPRESSION: 1. Well seated components of a total right hip arthroplasty. No complicating features are demonstrated. 2. The surrounding hip and pelvic musculature appear intact. No obvious muscle tear or hematoma by CT. 3. Degenerative changes at the pubic symphysis but no right-sided pelvic fractures are identified. Electronically Signed   By: PMarijo SanesM.D.   On: 08/08/2018 15:44    Procedures Procedures (including critical care time)  Medications Ordered in  ED Medications  sodium chloride 0.9 % injection (has no administration in time range)  ketorolac (TORADOL) 30 MG/ML injection 15 mg (has no administration in time range)  morphine 4 MG/ML injection 4 mg (4 mg Intravenous Given 08/08/18 1424)  iopamidol (ISOVUE-300) 61 % injection 100 mL (100 mLs Intravenous Contrast Given 08/08/18 1516)     Initial Impression / Assessment and Plan / ED Course  I have reviewed the triage vital signs and the nursing notes.  Pertinent labs & imaging results that were available during my care of the patient were reviewed by me and considered in  my medical decision making (see chart for details).     Patient will need follow-up with her orthopedist.  Have advised her to return here for any worsening in her condition.  Patient will be given pain control and advised to use ice and heat on the areas that are sore.  Patient agrees the plan and all questions were answered.  Patient is able to move the joint and I am able to passively range of motion although there is some discomfort.  I do not feel that there is a lumbar component to this at this time.  She does not have any weakness in the lower extremity or decreased sensation. Final Clinical Impressions(s) / ED Diagnoses   Final diagnoses:  Right hip pain    ED Discharge Orders         Ordered    HYDROcodone-acetaminophen (NORCO/VICODIN) 5-325 MG tablet  Every 6 hours PRN     08/08/18 1707    predniSONE (DELTASONE) 50 MG tablet  Daily with breakfast     08/08/18 1709           Dalia Heading, PA-C 08/08/18 2031    Long, Wonda Olds, MD 08/09/18 [phone]

## 2018-08-19 DIAGNOSIS — B029 Zoster without complications: Secondary | ICD-10-CM | POA: Diagnosis not present

## 2018-08-26 DIAGNOSIS — J209 Acute bronchitis, unspecified: Secondary | ICD-10-CM | POA: Diagnosis not present

## 2018-08-28 ENCOUNTER — Ambulatory Visit: Payer: Self-pay

## 2018-08-28 ENCOUNTER — Encounter: Payer: Self-pay | Admitting: Family Medicine

## 2018-08-28 ENCOUNTER — Ambulatory Visit (INDEPENDENT_AMBULATORY_CARE_PROVIDER_SITE_OTHER): Payer: Medicare Other | Admitting: Family Medicine

## 2018-08-28 VITALS — BP 128/84 | HR 76 | Temp 98.9°F | Ht 63.0 in | Wt 178.8 lb

## 2018-08-28 DIAGNOSIS — J40 Bronchitis, not specified as acute or chronic: Secondary | ICD-10-CM

## 2018-08-28 DIAGNOSIS — R05 Cough: Secondary | ICD-10-CM

## 2018-08-28 DIAGNOSIS — R059 Cough, unspecified: Secondary | ICD-10-CM

## 2018-08-28 MED ORDER — AZITHROMYCIN 250 MG PO TABS: ORAL_TABLET | ORAL | 0 refills | Status: DC

## 2018-08-28 MED ORDER — GUAIFENESIN-CODEINE 100-10 MG/5ML PO SOLN: 5.0000 mL | Freq: Three times a day (TID) | ORAL | 0 refills | Status: DC | PRN

## 2018-08-28 MED ORDER — ALBUTEROL SULFATE HFA 108 (90 BASE) MCG/ACT IN AERS: 2.0000 | INHALATION_SPRAY | Freq: Four times a day (QID) | RESPIRATORY_TRACT | 0 refills | Status: DC | PRN

## 2018-08-28 NOTE — Telephone Encounter (Signed)
Pt. called to report worsening symptoms with cough, intermittent wheeze on expiration, and fever.  Seen in UC on Monday.  Reported she feels like she hasn't given the medications enough time to take effect, but doesn't think the cough medication, Benzonatate, is very effective.  Reported she is coughing frequently.  Stated she can't cough anything up.  Reported last night, she coughed so hard, that she vomited brown fluid with mucus.  Temp. 100.4 @ 8:00 AM today. C/o sore throat.  Stated "my chest muscles are sore from the frequent cough."  Pt. coughing the entire time on phone with Nurse Triage.  Scheduled appt. With PCP office @ 11:00 AM.  Care advice given per protocol.  Advised she should wear a mask or keep mouth covered, when entering the clinic. Verb. Understanding.  Agrees with plan.           Reason for Disposition . SEVERE coughing spells (e.g., whooping sound after coughing, vomiting after coughing)  Answer Assessment - Initial Assessment Questions 1. ONSET: "When did the cough begin?"      08/25/18 2. SEVERITY: "How bad is the cough today?"      coughing is very frequent 3. RESPIRATORY DISTRESS: "Describe your breathing."      Shortness of breath  4. FEVER: "Do you have a fever?" If so, ask: "What is your temperature, how was it measured, and when did it start?"     100.4 at about 8:00 AM ; reported she usually runs 96.8 5. HEMOPTYSIS: "Are you coughing up any blood?" If so ask: "How much?" (flecks, streaks, tablespoons, etc.)     Coughed/ vomited last night with brown mucus / fluid 6. TREATMENT: "What have you done so far to treat the cough?" (e.g., meds, fluids, humidifier)     Prednisone, Amoxicillin, and Bensonetate 7. CARDIAC HISTORY: "Do you have any history of heart disease?" (e.g., heart attack, congestive heart failure)      *No Answer* 8. LUNG HISTORY: "Do you have any history of lung disease?"  (e.g., pulmonary embolus, asthma, emphysema)     Recently diag. With  bronchitis; hx of bronchial asthma   9. PE RISK FACTORS: "Do you have a history of blood clots?" (or: recent major surgery, recent prolonged travel, bedridden)     *No Answer* 10. OTHER SYMPTOMS: "Do you have any other symptoms? (e.g., runny nose, wheezing, chest pain)      Wheeze with expiration, sore throat, nasal with dry congestion, ears with increased wax, chest muscles are sore    11. PREGNANCY: "Is there any chance you are pregnant?" "When was your last menstrual period?"       N/a 12. TRAVEL: "Have you traveled out of the country in the last month?" (e.g., travel history, exposures)       N/a  Protocols used: COUGH - ACUTE NON-PRODUCTIVE-A-AH

## 2018-08-28 NOTE — Patient Instructions (Signed)
It was very nice to see you today!  We will switch from amoxicillin to azithromycin.  Continue the prednisone.  I will send in a cough medication with codeine and an albuterol inhaler for you.  Please make sure you stay well-hydrated and get plenty of rest over the next few days.  Please let me know or let Dr. Juleen China know if your symptoms worsen or do not improve.  Take care, Dr Jerline Pain

## 2018-08-28 NOTE — Progress Notes (Signed)
   Subjective:  Margaret White is a 67 y.o. female who presents today for same-day appointment with a chief complaint of cough.   HPI:  Cough, Acute problem Symptoms started about a week ago.  She went to urgent care 4 days ago and was diagnosed with bronchitis.  At that visit, she was started on prednisone, Tessalon, and amoxicillin.  She was additionally given an injection of the medication that she does not remember the name of.  Symptoms have worsened since her urgent care visit.  She has had some fevers.  Some muscle aches.  Sore throat and runny nose as well.  Cough keeps her up at night.  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  No other obvious alleviating or aggravating factors.  ROS: Per HPI  PMH: She reports that she has never smoked. She has never used smokeless tobacco. She reports that she does not drink alcohol or use drugs.  Objective:  Physical Exam: BP 128/84 (BP Location: Left Arm, Patient Position: Sitting, Cuff Size: Normal)   Pulse 76   Temp 98.9 F (37.2 C) (Oral)   Ht 5' 3"  (1.6 m)   Wt 178 lb 12.8 oz (81.1 kg)   SpO2 95%   BMI 31.67 kg/m   Gen: NAD, resting comfortably HEENT: TMs with clear effusion.  Oropharynx erythematous.  His mucosa erythematous and boggy bilaterally. CV: RRR with no murmurs appreciated Pulm: NWOB, frequent cough.  Diffuse scattered wheezes and rhonchi.  Good airflow throughout.  Assessment/Plan:  Cough/bronchitis Patient is afebrile today and does not have any signs of systemic illness.  We will switch her from amoxicillin to azithromycin for better atypical coverage.  Discussed flu testing today, however deferred given that it would not change her management.  We will also send in guaifenesin-codeine cough syrup (patient has tolerated this in the past.)  She will continue her course of prednisone.  Will start albuterol inhaler as well.  Recommended good oral hydration and relative rest over the next few days.   Discussed reasons to return to care.  Follow-up as needed.  Algis Greenhouse. Jerline Pain, MD 08/28/2018 10:51 AM

## 2018-08-28 NOTE — Telephone Encounter (Signed)
Reported seen in UC on Monday. Diag. With bronchitis.  The Bensonatate for cough is not helping.  C/o exp. Wheeze.  Temp in 100 degree range.  Feels she is getting worse.

## 2018-09-01 ENCOUNTER — Encounter: Payer: Self-pay | Admitting: Family Medicine

## 2018-09-01 ENCOUNTER — Ambulatory Visit (INDEPENDENT_AMBULATORY_CARE_PROVIDER_SITE_OTHER): Payer: Medicare Other | Admitting: Family Medicine

## 2018-09-01 ENCOUNTER — Ambulatory Visit (INDEPENDENT_AMBULATORY_CARE_PROVIDER_SITE_OTHER): Payer: Medicare Other

## 2018-09-01 VITALS — BP 126/82 | HR 76 | Temp 98.4°F | Ht 63.0 in | Wt 178.0 lb

## 2018-09-01 DIAGNOSIS — J9811 Atelectasis: Secondary | ICD-10-CM | POA: Diagnosis not present

## 2018-09-01 DIAGNOSIS — R05 Cough: Secondary | ICD-10-CM

## 2018-09-01 DIAGNOSIS — J189 Pneumonia, unspecified organism: Secondary | ICD-10-CM | POA: Diagnosis not present

## 2018-09-01 DIAGNOSIS — R059 Cough, unspecified: Secondary | ICD-10-CM

## 2018-09-01 MED ORDER — HYDROCODONE-HOMATROPINE 5-1.5 MG/5ML PO SYRP: 5.0000 mL | ORAL_SOLUTION | Freq: Three times a day (TID) | ORAL | 0 refills | Status: DC | PRN

## 2018-09-01 MED ORDER — LEVOFLOXACIN 750 MG PO TABS: 750.0000 mg | ORAL_TABLET | Freq: Every day | ORAL | 0 refills | Status: DC

## 2018-09-01 NOTE — Patient Instructions (Signed)
It was very nice to see you today!  I think you may have a small pneumonia. Please start the levaquin and hycodan.  Please continue taking the inhaler.  Please stay well-hydrated.  Please let me or Dr. Juleen China know if your symptoms worsen or do not improve in the next few days.  Take care, Dr Jerline Pain

## 2018-09-01 NOTE — Progress Notes (Signed)
   Subjective:  Margaret White is a 67 y.o. female who presents today for same-day appointment with a chief complaint of cough.   HPI:  Cough, Acute problem Last seen 4 days ago for this. She was started on azithromycin, guaifenesin-codeine cough syrup, and albuterol.  She continued her course of prednisone.  Symptoms have not improved over the last 4 days. No fevers or chills. Worse at night. Has some axilla pain associated with the cough.   ROS: Per HPI  Objective:  Physical Exam: BP 126/82 (BP Location: Left Arm, Patient Position: Sitting, Cuff Size: Normal)   Pulse 76   Temp 98.4 F (36.9 C) (Oral)   Ht 5' 3"  (1.6 m)   Wt 178 lb (80.7 kg)   SpO2 96%   BMI 31.53 kg/m   Gen: NAD, resting comfortably CV: RRR with no murmurs appreciated Pulm: NWOB, scattered wheezes and rhonchi.  Mild rales at left lower base.  Assessment/Plan:  Cough Very with small infiltrate in lingula based on my read-we will await radiology read.  Given that her symptoms are not improving and she has already completed a course of amoxicillin and azithromycin, we will start a course of Levaquin today. Also send in hycodan.  She will continue using albuterol inhaler.  Recommended good oral hydration over the next few days.  Discussed reasons to return to care.  Follow-up as needed.  Algis Greenhouse. Jerline Pain, MD 09/01/2018 1:12 PM

## 2018-09-02 NOTE — Progress Notes (Signed)
Dr Marigene Ehlers interpretation of your xray report:  The radiologist reviewed your xray and did not see any additional findings. Please continue with the levaquin and let me or Dr Juleen China know if your symptoms do not improve.    If you have any additional questions, please give [place] a call or send [place] a message through [place].  Take care, Dr Jerline Pain

## 2018-09-08 ENCOUNTER — Encounter: Payer: Self-pay | Admitting: Physician Assistant

## 2018-09-08 ENCOUNTER — Ambulatory Visit (INDEPENDENT_AMBULATORY_CARE_PROVIDER_SITE_OTHER): Payer: Medicare Other | Admitting: Physician Assistant

## 2018-09-08 VITALS — BP 140/88 | HR 77 | Temp 98.8°F | Ht 63.0 in | Wt 176.2 lb

## 2018-09-08 DIAGNOSIS — J189 Pneumonia, unspecified organism: Secondary | ICD-10-CM

## 2018-09-08 DIAGNOSIS — R05 Cough: Secondary | ICD-10-CM | POA: Diagnosis not present

## 2018-09-08 DIAGNOSIS — R059 Cough, unspecified: Secondary | ICD-10-CM

## 2018-09-08 MED ORDER — PREDNISONE 20 MG PO TABS: 20.0000 mg | ORAL_TABLET | Freq: Every day | ORAL | 0 refills | Status: DC

## 2018-09-08 NOTE — Patient Instructions (Signed)
It was great to see you!  Start oral daily prednisone for 5 days.  Push fluids and get plenty of rest.  Please remember that the cough with pneumonia can sometimes last 6 to 8 weeks.  If you are not improved, let;s think about getting you into pulmonary.  Call clinic with questions.  I hope you start feeling better soon!

## 2018-09-08 NOTE — Progress Notes (Signed)
Margaret White is a 67 y.o. female here for a follow up of a pre-existing problem.  I acted as a Education administrator for [employer], PA-C Anselmo Pickler, LPN  History of Present Illness:   Chief Complaint  Patient presents with  . Cough   Patient was seen by Dr. Dimas Chyle on 08/28/2018 and was diagnosed with bronchitis given azithromycin, Cheratussin, albuterol.  She returned 4 days later and was diagnosed with pneumonia of left lung and was changed to Hycodan and Levaquin.  She reports that since she has started her medication she feels as though she has not improved.  She states that she has fibromyalgia and she thinks that this is preventing her from improving as she should be.    Cough  This is a recurrent problem. Episode onset: Started 2 weeks ago, pt does not feel any better. The cough is non-productive. Associated symptoms include headaches, nasal congestion (white/green/ yellow drainage), postnasal drip, shortness of breath and wheezing. Pertinent negatives include no chills, ear congestion, ear pain or sore throat. The symptoms are aggravated by lying down. Risk factors for lung disease include occupational exposure. She has tried oral steroids, rest and prescription cough suppressant (Levaquin finished yesterday) for the symptoms. Her past medical history is significant for bronchitis and pneumonia.   She tells me that she is sleeping well, she is eating and drinking well, and that fatigue is her worst symptom at this point.  She denies any further true fever since starting Levaquin.  She took the Levaquin as prescribed.  She has never seen a pulmonologist.  Past Medical History:  Diagnosis Date  . Abnormal LFTs 10/14/2017  . Allergic rhinitis 10/14/2017  . Anxiety   . Avascular necrosis ([place]) 03/24/2018  . Avascular necrosis of hip, right ([place]) 03/24/2018  . Chronic maxillary sinusitis 10/14/2017  . Essential (primary) hypertension 06/26/2017  . Essential hypertension 06/26/2017   Last  Assessment & Plan:  Low sodium diet  Check labs and monitor  Continue meds  . Female cystocele 10/14/2017  . Fibromyalgia 10/14/2017  . GERD (gastroesophageal reflux disease) 10/14/2017  . Headache    cluster HA  . Hepatic steatosis 10/14/2017  . 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 History of cluster headache 10/14/2017  . IBS (irritable bowel syndrome) 10/14/2017  . 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 OSA (obstructive sleep apnea) 10/14/2017  . 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 Pneumonia   . Postmenopausal 10/14/2017  . Primary osteoarthritis of both hands 07/10/2017  . Recurrent major depressive disorder, in full remission ([place]) 06/26/2017  . RLS (restless legs syndrome) 10/14/2017   not diagnosed  . Seizures ([place])    2006  x1  . 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 Shallow disc bulging without significant neuroforaminal stenosis.     Social History   Socioeconomic History  . Marital status: Married    Spouse name: Not on file  . Number of children: Not on file  . Years of education: Not on file  . Highest education level: Not on file  Occupational History  . Occupation: Product manager: [employer]  Social Needs  . Financial resource strain: Not  on file  . Food insecurity:    Worry: Not on file    Inability: Not on file  . Transportation needs:    Medical: Not on file    Non-medical: Not on file  Tobacco Use  . Smoking status: Never Smoker  . Smokeless tobacco: Never Used  Substance and Sexual Activity  . Alcohol use: No    Frequency: Never  . Drug use: No  . Sexual activity: Yes  Lifestyle  . Physical activity:    Days per  week: Not on file    Minutes per session: Not on file  . Stress: Not on file  Relationships  . Social connections:    Talks on phone: Not on file    Gets together: Not on file    Attends religious service: Not on file    Active member of club or organization: Not on file    Attends meetings of clubs or organizations: Not on file    Relationship status: Not on file  . Intimate partner violence:    Fear of current or ex partner: Not on file    Emotionally abused: Not on file    Physically abused: Not on file    Forced sexual activity: Not on file  Other Topics Concern  . Not on file  Social History Narrative  . Not on file    Past Surgical History:  Procedure Laterality Date  . atopic pregnancy    . BREAST BIOPSY Right    approximately 10 years ago/ benign    . BREAST BIOPSY Right   . CARPAL TUNNEL RELEASE     bil  . EYE SURGERY     as a child eyes went out, Bil cataracts 2019  . JOINT REPLACEMENT     Right total hip Dr. Zollie Beckers  . LAPAROSCOPIC TOTAL HYSTERECTOMY    . NASAL SEPTUM SURGERY    . prolapsed bladder    . RHINOPLASTY    . TOTAL HIP ARTHROPLASTY Right 04/11/2018   Procedure: RIGHT TOTAL HIP ARTHROPLASTY ANTERIOR APPROACH;  Surgeon: Mcarthur Rossetti, MD;  Location: [facility];  Service: Orthopedics;  Laterality: Right;  . tummy tuck    . ulnar nerve relocation     left    Family History  Problem Relation Age of Onset  . Hypertension Mother   . Cancer Father   . Heart disease Father     Allergies  Allergen Reactions  . Codeine Itching  . Sulfa Antibiotics Rash and Other (See Comments)    Flu like symptoms    Current Medications:   Current Outpatient Medications:  .  albuterol (PROVENTIL HFA;VENTOLIN HFA) 108 (90 Base) MCG/ACT inhaler, Inhale 2 puffs into the lungs every 6 (six) hours as needed for wheezing or shortness of breath., Disp: 1 Inhaler, Rfl: 0 .  amLODipine (NORVASC) 5 MG tablet, Take 1 tablet (5 mg total) by mouth daily., Disp:  90 tablet, Rfl: 2 .  aspirin-acetaminophen-caffeine (EXCEDRIN MIGRAINE) 250-250-65 MG tablet, Take 2 tablets by mouth every 8 (eight) hours as needed for headache., Disp: , Rfl:  .  FLUoxetine (PROZAC) 20 MG tablet, Take 1 tablet (20 mg total) by mouth daily., Disp: 90 tablet, Rfl: 1 .  fluticasone (FLONASE) 50 MCG/ACT nasal spray, Place 1 spray into both nostrils daily. (Patient taking differently: Place 1 spray into both nostrils daily as needed for allergies. ), Disp: 16 g, Rfl: 3 .  HYDROcodone-homatropine (HYCODAN) 5-1.5 MG/5ML syrup, Take 5 mLs by mouth every 8 (eight)  hours as needed for cough., Disp: 120 mL, Rfl: 0 .  loperamide (IMODIUM) 2 MG capsule, Take 2 mg by mouth daily. For IBS, Disp: , Rfl:  .  loratadine (CLARITIN) 10 MG tablet, Take 10 mg by mouth daily., Disp: , Rfl:  .  Mirabegron (MYRBETRIQ PO), Take 25 mg by mouth daily., Disp: , Rfl:  .  montelukast (SINGULAIR) 10 MG tablet, Take 1 tablet (10 mg total) by mouth at bedtime. (Patient taking differently: Take 10 mg by mouth daily. ), Disp: 90 tablet, Rfl: 3 .  predniSONE (DELTASONE) 20 MG tablet, Take 1 tablet (20 mg total) by mouth daily with breakfast., Disp: 5 tablet, Rfl: 0   Review of Systems:   Review of Systems  Constitutional: Negative for chills.  HENT: Positive for postnasal drip. Negative for ear pain and sore throat.   Respiratory: Positive for cough, shortness of breath and wheezing.   Neurological: Positive for headaches.    Vitals:   Vitals:   09/08/18 1309  BP: 140/88  Pulse: 77  Temp: 98.8 F (37.1 C)  TempSrc: Oral  SpO2: 97%  Weight: 176 lb 4 oz (79.9 kg)  Height: 5' 3"  (1.6 m)     Body mass index is 31.22 kg/m.  Physical Exam:   Physical Exam  Constitutional: She appears well-developed. She is cooperative.  Non-toxic appearance. She does not have a sickly appearance. She does not appear ill. No distress.  HENT:  Head: Normocephalic and atraumatic.  Right Ear: Tympanic membrane,  external ear and ear canal normal. Tympanic membrane is not erythematous, not retracted and not bulging.  Left Ear: Tympanic membrane, external ear and ear canal normal. Tympanic membrane is not erythematous, not retracted and not bulging.  Nose: Nose normal. Right sinus exhibits no maxillary sinus tenderness and no frontal sinus tenderness. Left sinus exhibits no maxillary sinus tenderness and no frontal sinus tenderness.  Mouth/Throat: Uvula is midline. No posterior oropharyngeal edema or posterior oropharyngeal erythema.  Eyes: Conjunctivae and lids are normal.  Neck: Trachea normal.  Cardiovascular: Normal rate, regular rhythm, S1 normal, S2 normal and normal heart sounds.  Pulmonary/Chest: Effort normal and breath sounds normal. She has no decreased breath sounds. She has no wheezes. She has no rhonchi. She has no rales.  Lymphadenopathy:    She has no cervical adenopathy.  Neurological: She is alert.  Skin: Skin is warm, dry and intact.  Psychiatric: She has a normal mood and affect. Her speech is normal and behavior is normal.  Nursing note and vitals reviewed.    Assessment and Plan:   Dreamer was seen today for cough.  Diagnoses and all orders for this visit:  Cough  Pneumonia of left lung due to infectious organism, unspecified part of lung  Other orders -     predniSONE (DELTASONE) 20 MG tablet; Take 1 tablet (20 mg total) by mouth daily with breakfast.   No red flags on exam.  Will initiate prednisone per orders. Discussed taking medications as prescribed. Reviewed return precautions including worsening fever, SOB, worsening cough or other concerns. Push fluids and rest. I recommend that patient follow-up if symptoms worsen or persist despite treatment x 7-10 days, sooner if needed.  I discussed with patient that if her symptoms are not improving, we may need to consider pulmonology.  Patient verbalized understanding to this plan.  . Reviewed expectations re: course of  current medical issues. . Discussed self-management of symptoms. . Outlined signs and symptoms indicating need for more acute intervention. Marland Kitchen  Patient verbalized understanding and all questions were answered. . See orders for this visit as documented in the electronic medical record. . Patient received an After-Visit Summary.  CMA or LPN served as scribe during this visit. History, Physical, and Plan performed by medical provider. The above documentation has been reviewed and is accurate and complete.  Inda Coke, PA-C

## 2018-09-11 ENCOUNTER — Ambulatory Visit: Payer: Self-pay

## 2018-09-11 NOTE — Telephone Encounter (Signed)
Patient called and says "I just finished an antibiotic on Sunday for pneumonia. I will have this cough for about 6-8 weeks. Today I am still having sinus drainage. It drained to the back of my throat and I spit it out. It was thick, greenish phlegm. I want to know if the antibiotic I just finished is still working to clear up the infection or will I need to do something else. I don't have a fever or pain. Just the cough, which nothing is coming up, and the sinus drainage." I advised I will send this over to [facility] for review and someone will call back with her recommendation, she verbalized understanding.   Reason for Disposition . [1] Follow-up call from patient regarding patient's clinical status AND [2] information NON-URGENT  Protocols used: PCP CALL - NO TRIAGE-A-AH

## 2018-09-11 NOTE — Telephone Encounter (Signed)
Sent to wrong practice.

## 2018-09-11 NOTE — Telephone Encounter (Signed)
Call place to patient. Left VM for pt to return call to office.

## 2018-09-12 NOTE — Telephone Encounter (Signed)
See note

## 2018-09-15 NOTE — Telephone Encounter (Signed)
Called and spoke with patient. She states she is still having symptoms. Agreed to come in for follow up. Appt scheduled Wednesday per patients request with Inda Coke, PA since she needs a late appt.

## 2018-09-17 ENCOUNTER — Encounter: Payer: Self-pay | Admitting: Physician Assistant

## 2018-09-17 ENCOUNTER — Ambulatory Visit (INDEPENDENT_AMBULATORY_CARE_PROVIDER_SITE_OTHER): Payer: Medicare Other | Admitting: Physician Assistant

## 2018-09-17 VITALS — BP 120/78 | HR 77 | Temp 98.7°F | Ht 63.0 in | Wt 182.5 lb

## 2018-09-17 DIAGNOSIS — J3489 Other specified disorders of nose and nasal sinuses: Secondary | ICD-10-CM | POA: Diagnosis not present

## 2018-09-17 DIAGNOSIS — J189 Pneumonia, unspecified organism: Secondary | ICD-10-CM | POA: Diagnosis not present

## 2018-09-17 MED ORDER — DOXYCYCLINE HYCLATE 100 MG PO TABS: 100.0000 mg | ORAL_TABLET | Freq: Two times a day (BID) | ORAL | 0 refills | Status: DC

## 2018-09-17 NOTE — Patient Instructions (Signed)
It was great to see you!  Overall I feel like you are improving!  If sinus symptoms worsen, start doxycycline. I have sent this in.  Push fluids and get plenty of rest. Please return if you are not improving as expected, or if you have high fevers (>101.5) or difficulty swallowing or worsening productive cough.  Call clinic with questions.  I hope you start feeling better soon!

## 2018-09-17 NOTE — Progress Notes (Signed)
Margaret White is a 67 y.o. female here for a follow up of a pre-existing problem.  I acted as a Education administrator for [employer], PA-C Anselmo Pickler, LPN   History of Present Illness:   Chief Complaint  Patient presents with  . Sinus Problem   Patient was seen by Dr. Dimas Chyle on 08/28/2018 and was diagnosed with bronchitis given azithromycin, Cheratussin, albuterol.  She returned 4 days later and was diagnosed with pneumonia of left lung and was changed to Hycodan and Levaquin.   She returned and saw me on 09/08/2018 and was given an extension of prednisone. Was having fatigue and feeling like she was not improving.  Today she is here for same symptoms but now has some sinus pressure that started yesterday.  Sinus Problem  This is a recurrent problem. Episode onset: Pt here today to follow up on Pneumonia diagnosed on 11/25.  The problem has been gradually improving since onset. Maximum temperature: Pt states has been running low grade temp 99. Her pain is at a severity of 5/10 (Sinus pressure and facial pain). The pain is moderate. Associated symptoms include congestion (Nasal drainage clear), coughing, ear pain (Left ), headaches and sinus pressure. Pertinent negatives include no shortness of breath or sore throat. Past treatments include antibiotics and spray decongestants (Inhalers, Flonase, completed all antibiotics and prednisone). The treatment provided no relief.    Past Medical History:  Diagnosis Date  . Abnormal LFTs 10/14/2017  . Allergic rhinitis 10/14/2017  . Anxiety   . Avascular necrosis ([place]) 03/24/2018  . Avascular necrosis of hip, right ([place]) 03/24/2018  . Chronic maxillary sinusitis 10/14/2017  . Essential (primary) hypertension 06/26/2017  . Essential hypertension 06/26/2017   Last Assessment & Plan:  Low sodium diet  Check labs and monitor  Continue meds  . Female cystocele 10/14/2017  . Fibromyalgia 10/14/2017  . GERD (gastroesophageal reflux disease) 10/14/2017  .  Headache    cluster HA  . Hepatic steatosis 10/14/2017  . 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 History of cluster headache 10/14/2017  . IBS (irritable bowel syndrome) 10/14/2017  . 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 OSA (obstructive sleep apnea) 10/14/2017  . 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 Pneumonia   . Postmenopausal 10/14/2017  . Primary osteoarthritis of both hands 07/10/2017  . Recurrent major depressive disorder, in full remission ([place]) 06/26/2017  . RLS (restless legs syndrome) 10/14/2017   not diagnosed  . Seizures ([place])    2006  x1  . 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 Shallow disc bulging without significant neuroforaminal stenosis.     Social History   Socioeconomic History  . Marital status: Married    Spouse name: Not on file  . Number of children: Not on file  . Years of education: Not on file  . Highest education level: Not on file  Occupational History  . Occupation: Product manager: [employer]  Social Needs  . Financial resource strain: Not on file  . Food insecurity:    Worry: Not on file    Inability: Not on file  . Transportation needs:    Medical: Not on file  Non-medical: Not on file  Tobacco Use  . Smoking status: Never Smoker  . Smokeless tobacco: Never Used  Substance and Sexual Activity  . Alcohol use: No    Frequency: Never  . Drug use: No  . Sexual activity: Yes  Lifestyle  . Physical activity:    Days per week: Not on file    Minutes per session: Not on file  . Stress: Not on file  Relationships  . Social connections:    Talks on phone: Not on file    Gets together: Not on file     Attends religious service: Not on file    Active member of club or organization: Not on file    Attends meetings of clubs or organizations: Not on file    Relationship status: Not on file  . Intimate partner violence:    Fear of current or ex partner: Not on file    Emotionally abused: Not on file    Physically abused: Not on file    Forced sexual activity: Not on file  Other Topics Concern  . Not on file  Social History Narrative  . Not on file    Past Surgical History:  Procedure Laterality Date  . atopic pregnancy    . BREAST BIOPSY Right    approximately 10 years ago/ benign    . BREAST BIOPSY Right   . CARPAL TUNNEL RELEASE     bil  . EYE SURGERY     as a child eyes went out, Bil cataracts 2019  . JOINT REPLACEMENT     Right total hip Dr. Zollie Beckers  . LAPAROSCOPIC TOTAL HYSTERECTOMY    . NASAL SEPTUM SURGERY    . prolapsed bladder    . RHINOPLASTY    . TOTAL HIP ARTHROPLASTY Right 04/11/2018   Procedure: RIGHT TOTAL HIP ARTHROPLASTY ANTERIOR APPROACH;  Surgeon: Mcarthur Rossetti, MD;  Location: [facility];  Service: Orthopedics;  Laterality: Right;  . tummy tuck    . ulnar nerve relocation     left    Family History  Problem Relation Age of Onset  . Hypertension Mother   . Cancer Father   . Heart disease Father     Allergies  Allergen Reactions  . Codeine Itching  . Sulfa Antibiotics Rash and Other (See Comments)    Flu like symptoms    Current Medications:   Current Outpatient Medications:  .  albuterol (PROVENTIL HFA;VENTOLIN HFA) 108 (90 Base) MCG/ACT inhaler, Inhale 2 puffs into the lungs every 6 (six) hours as needed for wheezing or shortness of breath., Disp: 1 Inhaler, Rfl: 0 .  amLODipine (NORVASC) 5 MG tablet, Take 1 tablet (5 mg total) by mouth daily., Disp: 90 tablet, Rfl: 2 .  aspirin-acetaminophen-caffeine (EXCEDRIN MIGRAINE) 250-250-65 MG tablet, Take 2 tablets by mouth every 8 (eight) hours as needed for headache., Disp: , Rfl:  .   FLUoxetine (PROZAC) 20 MG tablet, Take 1 tablet (20 mg total) by mouth daily., Disp: 90 tablet, Rfl: 1 .  fluticasone (FLONASE) 50 MCG/ACT nasal spray, Place 1 spray into both nostrils daily. (Patient taking differently: Place 1 spray into both nostrils daily as needed for allergies. ), Disp: 16 g, Rfl: 3 .  loperamide (IMODIUM) 2 MG capsule, Take 2 mg by mouth daily. For IBS, Disp: , Rfl:  .  loratadine (CLARITIN) 10 MG tablet, Take 10 mg by mouth daily., Disp: , Rfl:  .  Mirabegron (MYRBETRIQ PO), Take 25 mg by mouth daily., Disp: ,  Rfl:  .  montelukast (SINGULAIR) 10 MG tablet, Take 1 tablet (10 mg total) by mouth at bedtime. (Patient taking differently: Take 10 mg by mouth daily. ), Disp: 90 tablet, Rfl: 3 .  doxycycline (VIBRA-TABS) 100 MG tablet, Take 1 tablet (100 mg total) by mouth 2 (two) times daily., Disp: 20 tablet, Rfl: 0   Review of Systems:   Review of Systems  HENT: Positive for congestion (Nasal drainage clear), ear pain (Left ) and sinus pressure. Negative for sore throat.   Respiratory: Positive for cough. Negative for shortness of breath.   Neurological: Positive for headaches.    Vitals:   Vitals:   09/17/18 1548  BP: 120/78  Pulse: 77  Temp: 98.7 F (37.1 C)  TempSrc: Oral  SpO2: 94%  Weight: 182 lb 8 oz (82.8 kg)  Height: 5' 3"  (1.6 m)     Body mass index is 32.33 kg/m.  Physical Exam:   Physical Exam  Constitutional: She appears well-developed. She is cooperative.  Non-toxic appearance. She does not have a sickly appearance. She does not appear ill. No distress.  HENT:  Head: Normocephalic and atraumatic.  Right Ear: Tympanic membrane, external ear and ear canal normal. Tympanic membrane is not erythematous, not retracted and not bulging.  Left Ear: Tympanic membrane, external ear and ear canal normal. Tympanic membrane is not erythematous, not retracted and not bulging.  Nose: Mucosal edema and rhinorrhea present. Right sinus exhibits maxillary sinus  tenderness. Right sinus exhibits no frontal sinus tenderness. Left sinus exhibits maxillary sinus tenderness. Left sinus exhibits no frontal sinus tenderness.  Mouth/Throat: Uvula is midline and mucous membranes are normal. Posterior oropharyngeal erythema present. No posterior oropharyngeal edema. Tonsils are 0 on the right. Tonsils are 0 on the left.  Eyes: Conjunctivae and lids are normal.  Neck: Trachea normal.  Cardiovascular: Normal rate, regular rhythm, S1 normal, S2 normal and normal heart sounds.  Pulmonary/Chest: Effort normal and breath sounds normal. She has no decreased breath sounds. She has no wheezes. She has no rhonchi. She has no rales.  Lymphadenopathy:    She has no cervical adenopathy.  Neurological: She is alert.  Skin: Skin is warm, dry and intact.  Psychiatric: She has a normal mood and affect. Her speech is normal and behavior is normal.  Nursing note and vitals reviewed.     Assessment and Plan:   Tr[place]ie was seen today for sinus problem.  Diagnoses and all orders for this visit:  Sinus pressure  Pneumonia of left lung due to infectious organism, unspecified part of lung  Other orders -     doxycycline (VIBRA-TABS) 100 MG tablet; Take 1 tablet (100 mg total) by mouth 2 (two) times daily.   No red flags on exam.  She is improving overall and discussed that this takes time. Provided reassurance, but also stated that she needs to keep track of her symptoms and keep [place] posted on this. I did tell her that we typically do not treat sinusitis until day 10. I did give her doxycycline as a safety net if symptoms progress. Reviewed return precautions including worsening fever, SOB, worsening cough or other concerns. Push fluids and rest. I recommend that patient follow-up if symptoms worsen or persist despite treatment x 7-10 days, sooner if needed.  Repeat chest xray around 10/06/18 for repeat chest xray to help ensure resolution.  . Reviewed expectations re: course  of current medical issues. . Discussed self-management of symptoms. . Outlined signs and symptoms indicating need for  more acute intervention. . Patient verbalized understanding and all questions were answered. . See orders for this visit as documented in the electronic medical record. . Patient received an After-Visit Summary.  CMA or LPN served as scribe during this visit. History, Physical, and Plan performed by medical provider. The above documentation has been reviewed and is accurate and complete.   Inda Coke, PA-C

## 2018-09-30 ENCOUNTER — Telehealth: Payer: Self-pay | Admitting: Family Medicine

## 2018-09-30 DIAGNOSIS — J189 Pneumonia, unspecified organism: Secondary | ICD-10-CM

## 2018-09-30 NOTE — Telephone Encounter (Signed)
Please advise on xray order  Copied from [place] [phone]. Topic: Quick Communication - See Telephone Encounter >> Sep 30, 2018 10:14 AM Margot Ables wrote: CRM for notification. See Telephone encounter for: 09/30/18. Pt called to ask if appt is needed for repeat chest xray advised at [place] 09/17/18 by Inda Coke, PA. I do not see another order in the system for chest xray. Please advise.

## 2018-09-30 NOTE — Telephone Encounter (Signed)
Please see message and advise 

## 2018-10-02 NOTE — Telephone Encounter (Signed)
Small PNA diagnosed on 11/25 visit with Dimas Chyle. Repeat CXR approximately 4-6 weeks after that to see if there is improvement would be acceptable.  She can have chest xray anytime in the next two weeks if she would like this.  Ok to place future orders.

## 2018-10-03 NOTE — Addendum Note (Signed)
Addended by: Marian Sorrow on: 10/03/2018 01:40 PM   Modules accepted: Orders

## 2018-10-03 NOTE — Telephone Encounter (Signed)
Spoke to pt told her she can come in anytime next week for repeat chest xray, I already have the order in. Pt verbalized understanding.

## 2018-10-06 ENCOUNTER — Telehealth: Payer: Self-pay | Admitting: Family Medicine

## 2018-10-06 ENCOUNTER — Ambulatory Visit (INDEPENDENT_AMBULATORY_CARE_PROVIDER_SITE_OTHER): Payer: Medicare Other | Admitting: Physician Assistant

## 2018-10-06 ENCOUNTER — Ambulatory Visit: Payer: Medicare Other | Admitting: Family Medicine

## 2018-10-06 ENCOUNTER — Encounter: Payer: Self-pay | Admitting: Physician Assistant

## 2018-10-06 ENCOUNTER — Ambulatory Visit (INDEPENDENT_AMBULATORY_CARE_PROVIDER_SITE_OTHER): Payer: Medicare Other

## 2018-10-06 VITALS — BP 120/70 | HR 99 | Temp 98.7°F | Ht 63.0 in | Wt 182.0 lb

## 2018-10-06 DIAGNOSIS — R05 Cough: Secondary | ICD-10-CM

## 2018-10-06 DIAGNOSIS — J189 Pneumonia, unspecified organism: Secondary | ICD-10-CM | POA: Diagnosis not present

## 2018-10-06 DIAGNOSIS — R059 Cough, unspecified: Secondary | ICD-10-CM

## 2018-10-06 DIAGNOSIS — R918 Other nonspecific abnormal finding of lung field: Secondary | ICD-10-CM | POA: Diagnosis not present

## 2018-10-06 LAB — POC INFLUENZA A&B (BINAX/QUICKVUE)
INFLUENZA A, POC: NEGATIVE
Influenza B, POC: NEGATIVE

## 2018-10-06 MED ORDER — FLUTICASONE-SALMETEROL 115-21 MCG/ACT IN AERO: 2.0000 | INHALATION_SPRAY | Freq: Two times a day (BID) | RESPIRATORY_TRACT | 12 refills | Status: DC

## 2018-10-06 NOTE — Patient Instructions (Signed)
It was great to see you!  Use medication as prescribed: start advair inhaler  Pulmonology referral will be placed later today.  Push fluids and get plenty of rest. Please return if you are not improving as expected, or if you have high fevers (>101.5) or difficulty swallowing or worsening productive cough.  Call clinic with questions.  I hope you start feeling better soon!

## 2018-10-06 NOTE — Telephone Encounter (Signed)
error 

## 2018-10-06 NOTE — Progress Notes (Addendum)
Margaret White is a 67 y.o. female here for a follow up of a pre-existing problem.  I acted as a Education administrator for [employer], PA-C Anselmo Pickler, LPN  History of Present Illness:   Chief Complaint  Patient presents with  . Cough   HPI  Patient was seen by Dr. Dimas Chyle on 08/28/2018 and was diagnosed with bronchitis given azithromycin, Cheratussin, albuterol. She returned 4 days later and was diagnosed with pneumonia of left lung and was changed to Hycodan and Levaquin.   She returned and saw me on 09/08/2018 and was given an extension of prednisone. Was having fatigue and feeling like she was not improving.  Returned and saw me on 09/17/2018 and was developing sinus pressure. Was told to trial supportive care and given doxycycline if symptoms worsen/do not improve. She started this about 1 week ago.  She is about 4 weeks out from her diagnosis of PNA from Dr. Dimas Chyle. She is here for repeat chest xray.  Overall felt improved and then cough returned on Friday. Denies: fever, chills, sore throat, chest pain, dizziness.   Past Medical History:  Diagnosis Date  . Abnormal LFTs 10/14/2017  . Allergic rhinitis 10/14/2017  . Anxiety   . Avascular necrosis ([place]) 03/24/2018  . Avascular necrosis of hip, right ([place]) 03/24/2018  . Chronic maxillary sinusitis 10/14/2017  . Essential (primary) hypertension 06/26/2017  . Essential hypertension 06/26/2017   Last Assessment & Plan:  Low sodium diet  Check labs and monitor  Continue meds  . Female cystocele 10/14/2017  . Fibromyalgia 10/14/2017  . GERD (gastroesophageal reflux disease) 10/14/2017  . Headache    cluster HA  . Hepatic steatosis 10/14/2017  . 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 History of cluster headache 10/14/2017  . IBS (irritable bowel syndrome) 10/14/2017  . 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 OSA (obstructive sleep apnea) 10/14/2017  . 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 Pneumonia   . Postmenopausal 10/14/2017  . Primary osteoarthritis of both hands 07/10/2017  . Recurrent major depressive disorder, in full remission ([place]) 06/26/2017  . RLS (restless legs syndrome) 10/14/2017   not diagnosed  . Seizures ([place])    2006  x1  . 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 Shallow disc bulging without significant neuroforaminal stenosis.     Social History   Socioeconomic History  . Marital status: Married    Spouse name: Not on file  . Number of children: Not on file  . Years of education: Not on file  . Highest education level: Not on file  Occupational History  . Occupation: Product manager: [employer]  Social Needs  . Financial resource strain: Not on file  . Food insecurity:    Worry: Not on file    Inability: Not on file  . Transportation needs:    Medical: Not on file    Non-medical: Not on file  Tobacco Use  . Smoking status: Never Smoker  . Smokeless tobacco: Never Used  Substance and Sexual Activity  . Alcohol use: No    Frequency: Never  . Drug use: No  . Sexual activity: Yes  Lifestyle  .  Physical activity:    Days per week: Not on file    Minutes per session: Not on file  . Stress: Not on file  Relationships  . Social connections:    Talks on phone: Not on file    Gets together: Not on file    Attends religious service: Not on file    Active member of club or organization: Not on file    Attends meetings of clubs or organizations: Not on file    Relationship status: Not on file  . Intimate partner violence:    Fear of current or ex partner: Not on file    Emotionally abused: Not on file    Physically abused: Not on file    Forced sexual  activity: Not on file  Other Topics Concern  . Not on file  Social History Narrative  . Not on file    Past Surgical History:  Procedure Laterality Date  . atopic pregnancy    . BREAST BIOPSY Right    approximately 10 years ago/ benign    . BREAST BIOPSY Right   . CARPAL TUNNEL RELEASE     bil  . EYE SURGERY     as a child eyes went out, Bil cataracts 2019  . JOINT REPLACEMENT     Right total hip Dr. Zollie Beckers  . LAPAROSCOPIC TOTAL HYSTERECTOMY    . NASAL SEPTUM SURGERY    . prolapsed bladder    . RHINOPLASTY    . TOTAL HIP ARTHROPLASTY Right 04/11/2018   Procedure: RIGHT TOTAL HIP ARTHROPLASTY ANTERIOR APPROACH;  Surgeon: Mcarthur Rossetti, MD;  Location: [facility];  Service: Orthopedics;  Laterality: Right;  . tummy tuck    . ulnar nerve relocation     left    Family History  Problem Relation Age of Onset  . Hypertension Mother   . Cancer Father   . Heart disease Father     Allergies  Allergen Reactions  . Codeine Itching  . Sulfa Antibiotics Rash and Other (See Comments)    Flu like symptoms    Current Medications:   Current Outpatient Medications:  .  albuterol (PROVENTIL HFA;VENTOLIN HFA) 108 (90 Base) MCG/ACT inhaler, Inhale 2 puffs into the lungs every 6 (six) hours as needed for wheezing or shortness of breath., Disp: 1 Inhaler, Rfl: 0 .  amLODipine (NORVASC) 5 MG tablet, Take 1 tablet (5 mg total) by mouth daily., Disp: 90 tablet, Rfl: 2 .  aspirin-acetaminophen-caffeine (EXCEDRIN MIGRAINE) 250-250-65 MG tablet, Take 2 tablets by mouth every 8 (eight) hours as needed for headache., Disp: , Rfl:  .  doxycycline (VIBRA-TABS) 100 MG tablet, Take 1 tablet (100 mg total) by mouth 2 (two) times daily., Disp: 20 tablet, Rfl: 0 .  FLUoxetine (PROZAC) 20 MG tablet, Take 1 tablet (20 mg total) by mouth daily., Disp: 90 tablet, Rfl: 1 .  fluticasone (FLONASE) 50 MCG/ACT nasal spray, Place 1 spray into both nostrils daily. (Patient taking differently: Place 1  spray into both nostrils daily as needed for allergies. ), Disp: 16 g, Rfl: 3 .  fluticasone-salmeterol (ADVAIR HFA) 115-21 MCG/ACT inhaler, Inhale 2 puffs into the lungs 2 (two) times daily., Disp: 1 Inhaler, Rfl: 12 .  loperamide (IMODIUM) 2 MG capsule, Take 2 mg by mouth daily. For IBS, Disp: , Rfl:  .  loratadine (CLARITIN) 10 MG tablet, Take 10 mg by mouth daily., Disp: , Rfl:  .  Mirabegron (MYRBETRIQ PO), Take 25 mg by mouth daily., Disp: , Rfl:  .  montelukast (SINGULAIR) 10 MG tablet, Take 1 tablet (10 mg total) by mouth at bedtime. (Patient taking differently: Take 10 mg by mouth daily. ), Disp: 90 tablet, Rfl: 3   Review of Systems:   Review of Systems  Negative unless otherwise specified per HPI.  Vitals:   Vitals:   10/06/18 1229  BP: 120/70  Pulse: 99  Temp: 98.7 F (37.1 C)  TempSrc: Oral  SpO2: 96%  Weight: 182 lb (82.6 kg)  Height: 5' 3"  (1.6 m)     Body mass index is 32.24 kg/m.  Physical Exam:   Physical Exam Vitals signs and nursing note reviewed.  Constitutional:      General: She is not in acute distress.    Appearance: She is well-developed. She is not ill-appearing or toxic-appearing.  HENT:     Head: Normocephalic and atraumatic.     Right Ear: Tympanic membrane, ear canal and external ear normal. Tympanic membrane is not erythematous, retracted or bulging.     Left Ear: Tympanic membrane, ear canal and external ear normal. Tympanic membrane is not erythematous, retracted or bulging.     Nose: Mucosal edema, congestion and rhinorrhea present.     Right Sinus: No maxillary sinus tenderness or frontal sinus tenderness.     Left Sinus: No maxillary sinus tenderness or frontal sinus tenderness.     Mouth/Throat:     Pharynx: Uvula midline. Posterior oropharyngeal erythema present.     Tonsils: No tonsillar exudate.  Eyes:     General: Lids are normal.     Conjunctiva/sclera: Conjunctivae normal.  Neck:     Trachea: Trachea normal.   Cardiovascular:     Rate and Rhythm: Normal rate and regular rhythm.     Heart sounds: Normal heart sounds, S1 normal and S2 normal.  Pulmonary:     Effort: Pulmonary effort is normal.     Breath sounds: Normal breath sounds. No decreased breath sounds, wheezing, rhonchi or rales.  Lymphadenopathy:     Cervical: No cervical adenopathy.  Skin:    General: Skin is warm and dry.  Neurological:     Mental Status: She is alert.  Psychiatric:        Speech: Speech normal.        Behavior: Behavior normal. Behavior is cooperative.     Results for orders placed or performed in visit on 10/06/18  POC Influenza A&B(BINAX/QUICKVUE)  Result Value Ref Range   Influenza A, POC Negative Negative   Influenza B, POC Negative Negative   CXR: slightly improved from last xray -- waiting official radiology read  Assessment and Plan:   Margaret White was seen today for cough.  Diagnoses and all orders for this visit:  Cough -     POC Influenza A&B(BINAX/QUICKVUE) -     Ambulatory referral to Pulmonology  Other orders -     fluticasone-salmeterol (ADVAIR HFA) 115-21 MCG/ACT inhaler; Inhale 2 puffs into the lungs 2 (two) times daily.    No red flags on exam. CXR official radiology review pending. Flu negative. Finish out doxycycline course. Will initiate scheduled advair to help with symptoms. Given total duration of symptoms, will also refer to pulmonology for further evaluation and treatment. Discussed taking medications as prescribed. Reviewed return precautions including worsening fever, SOB, worsening cough or other concerns. Push fluids and rest. I recommend that patient follow-up if symptoms worsen or persist despite treatment x 7-10 days, sooner if needed.  . Reviewed expectations re: course of current medical issues. . Discussed self-management  of symptoms. . Outlined signs and symptoms indicating need for more acute intervention. . Patient verbalized understanding and all questions were  answered. . See orders for this visit as documented in the electronic medical record. . Patient received an After-Visit Summary.  CMA or LPN served as scribe during this visit. History, Physical, and Plan performed by medical provider. The above documentation has been reviewed and is accurate and complete.  Inda Coke, PA-C

## 2018-10-08 DIAGNOSIS — R911 Solitary pulmonary nodule: Secondary | ICD-10-CM

## 2018-10-08 HISTORY — DX: Solitary pulmonary nodule: R91.1

## 2018-10-11 ENCOUNTER — Encounter: Payer: Self-pay | Admitting: Nurse Practitioner

## 2018-10-11 ENCOUNTER — Ambulatory Visit: Payer: Self-pay | Admitting: Nurse Practitioner

## 2018-10-11 VITALS — BP 110/80 | HR 102 | Temp 98.6°F | Resp 12 | Wt 185.0 lb

## 2018-10-11 DIAGNOSIS — J209 Acute bronchitis, unspecified: Secondary | ICD-10-CM

## 2018-10-11 MED ORDER — METHYLPREDNISOLONE 4 MG PO TBPK: ORAL_TABLET | ORAL | 0 refills | Status: AC

## 2018-10-11 NOTE — Patient Instructions (Addendum)
Acute Bronchitis, Adult -Take medication as prescribed. -Ibuprofen or Tylenol for pain, fever, or general discomfort. -Increase fluids. -Sleep elevated on at least 2 pillows at bedtime to help with cough. -Use a humidifier or vaporizer when at home and during sleep to help with cough. -May use a teaspoon of honey or over-the-counter cough drops to help with cough. -Follow-up with PCP office on Monday to ensure referral is completed for pulmonology.  If there is any concern to include [place] of breath, difficulty breathing, or other concerns, please go to the emergency department immediately.  Acute bronchitis is sudden (acute) swelling of the air tubes (bronchi) in the lungs. Acute bronchitis causes these tubes to fill with mucus, which can make it hard to breathe. It can also cause coughing or wheezing. In adults, acute bronchitis usually goes away within 2 weeks. A cough caused by bronchitis may last up to 3 weeks. Smoking, allergies, and asthma can make the condition worse. Repeated episodes of bronchitis may cause further lung problems, such as chronic obstructive pulmonary disease (COPD). What are the causes? This condition can be caused by germs and by substances that irritate the lungs, including:  Cold and flu viruses. This condition is most often caused by the same virus that causes a cold.  Bacteria.  Exposure to tobacco smoke, dust, fumes, and air pollution. What increases the risk? This condition is more likely to develop in people who:  Have close contact with someone with acute bronchitis.  Are exposed to lung irritants, such as tobacco smoke, dust, fumes, and vapors.  Have a weak immune system.  Have a respiratory condition such as asthma. What are the signs or symptoms? Symptoms of this condition include:  A cough.  Coughing up clear, yellow, or green mucus.  Wheezing.  Chest congestion.  Shortness of breath.  A fever.  Body aches.  Chills.  A sore  throat. How is this diagnosed? This condition is usually diagnosed with a physical exam. During the exam, your health care provider may order tests, such as chest X-rays, to rule out other conditions. He or she may also:  Test a sample of your mucus for bacterial infection.  Check the level of oxygen in your blood. This is done to check for pneumonia.  Do a chest X-ray or lung function testing to rule out pneumonia and other conditions.  Perform blood tests. Your health care provider will also ask about your symptoms and medical history. How is this treated? Most cases of acute bronchitis clear up over time without treatment. Your health care provider may recommend:  Drinking more fluids. Drinking more makes your mucus thinner, which may make it easier to breathe.  Taking a medicine for a fever or cough.  Taking an antibiotic medicine.  Using an inhaler to help improve shortness of breath and to control a cough.  Using a cool mist vaporizer or humidifier to make it easier to breathe. Follow these instructions at home: Medicines  Take over-the-counter and prescription medicines only as told by your health care provider.  If you were prescribed an antibiotic, take it as told by your health care provider. Do not stop taking the antibiotic even if you start to feel better. General instructions   Get plenty of rest.  Drink enough fluids to keep your urine pale yellow.  Avoid smoking and secondhand smoke. Exposure to cigarette smoke or irritating chemicals will make bronchitis worse. If you smoke and you need help quitting, ask your health care provider. Quitting  smoking will help your lungs heal faster.  Use an inhaler, cool mist vaporizer, or humidifier as told by your health care provider.  Keep all follow-up visits as told by your health care provider. This is important. How is this prevented? To lower your risk of getting this condition again:  Wash your hands often with  soap and water. If soap and water are not available, use hand sanitizer.  Avoid contact with people who have cold symptoms.  Try not to touch your hands to your mouth, nose, or eyes.  Make sure to get the flu shot every year. Contact a health care provider if:  Your symptoms do not improve in 2 weeks of treatment. Get help right away if:  You cough up blood.  You have chest pain.  You have severe shortness of breath.  You become dehydrated.  You faint or keep feeling like you are going to faint.  You keep vomiting.  You have a severe headache.  Your fever or chills gets worse. This information is not intended to replace advice given to you by your health care provider. Make sure you discuss any questions you have with your health care provider. Document Released: 11/01/2004 Document Revised: 05/08/2017 Document Reviewed: 03/14/2016 Elsevier Interactive Patient Education  2019 [employer].

## 2018-10-11 NOTE — Progress Notes (Signed)
Subjective:    Patient ID: Yancey Flemings, female    DOB: 10/29/1950, 68 y.o.   MRN: [id number]  The patient is a 68 year old female who presents today for complaints of a continued cough.  The patient's symptoms began in November/2019, and patient was seen by her primary care physician's office at that time.  Patient was diagnosed with bronchitis and given a Z-Pak,  cough medicine, and an albuterol inhaler.  4 days after that, the patient returned to that same office and was diagnosed with pneumonia.  It was then placed on Hycodan and Levaquin.  Patient then followed up again on 12/2 and was given an extension of prednisone.  Patient felt like she was not improving and had fatigue.  Patient then returned to her primary care office on 12/11 and was diagnosed with a sinus infection.  Patient was given a prescription for doxycycline at that time.  Patient followed up in her PCP office on 12/30 which is about 4 weeks from her diagnosis of pneumonia from her PCP.  Patient was therefore a repeat chest x-ray.  Patient's chest x-ray was normal at that time and did not show any signs of pneumonia or bronchitis per the patient.  The patient states she is currently using Flonase, and has a steroid inhaler that was given to her by her PCP for her cough.  At this appointment on 12/30, patient was given a referral for pulmonology.  Today the patient returns for complaints of not feeling any better.  Patient states she is fatigued, and feels like her symptoms are not improving.  Patient states that her cough is worse and she does have some continuing sinus pressure.  Patient states the cough is persistent throughout the day but does worsen at night.  The patient denies fever, chills, ear pain, ear drainage, sore throat.  Patient states she does have some mild nasal congestion with clear drainage.  Denies a past medical history of smoking, but does state that she has a 45-year history of secondhand smoke.    I have  reviewed the patient's past medical history, current medications, and allergies.   Review of Systems  Constitutional: Positive for activity change and fatigue. Negative for appetite change, chills and fever.  HENT: Positive for congestion (mild) and sinus pressure. Negative for ear discharge, ear pain and sore throat.   Eyes: Negative.   Respiratory: Positive for chest tightness and wheezing.   Cardiovascular: Negative.   Gastrointestinal: Negative.   Skin: Negative.   Neurological: Negative.       Objective:   Physical Exam Vitals signs reviewed.  Constitutional:      Appearance: Normal appearance.     Comments: Fatigued due to persistent cough  HENT:     Head: Normocephalic.     Right Ear: Tympanic membrane and ear canal normal.     Left Ear: Tympanic membrane and ear canal normal.     Nose: Congestion (mild) present.     Mouth/Throat:     Mouth: Mucous membranes are moist.  Eyes:     Pupils: Pupils are equal, round, and reactive to light.  Neck:     Musculoskeletal: Normal range of motion and neck supple. No neck rigidity or muscular tenderness.  Cardiovascular:     Rate and Rhythm: Regular rhythm. Tachycardia present.     Pulses: Normal pulses.     Heart sounds: Normal heart sounds.  Pulmonary:     Effort: Pulmonary effort is normal.     Breath sounds:  Normal breath sounds. No wheezing, rhonchi or rales.  Abdominal:     General: Abdomen is flat.     Tenderness: There is no abdominal tenderness.  Skin:    General: Skin is warm and dry.     Capillary Refill: Capillary refill takes 2 to 3 seconds.  Neurological:     General: No focal deficit present.     Mental Status: She is alert and oriented to person, place, and time.     Cranial Nerves: No cranial nerve deficit.  Psychiatric:        Mood and Affect: Mood normal.        Thought Content: Thought content normal.       Assessment & Plan:   Exam findings, diagnosis etiology and medication use and indications  reviewed with patient. Follow- Up and discharge instructions provided. No emergent/urgent issues found on exam.  Based on the patient's clinical presentation and physical assessment, I am going to go ahead and prescribe a tapering dose of methylprednisolone.  Discussed with patient that due to the recent influx of antibiotics that she has been currently taking, I am not going to prescribe any further antibiotics today.  Informed patient that she needs to contact her PCP office first thing on Monday morning to ensure that an appointment can be made for pulmonology.  Discussed with patient there is some concern for COPD as she mentioned a 45-year secondhand smoke history.  Informed patient that pulmonology will perform testing to make a definite diagnosis.  In the interim, patient will start the steroids, begin sleeping elevated on pillows, and using a vaporizer or humidifier in her bedroom at night.  Also informed patient that if she begins to develop shortness of breath or difficulty breathing, to go to the emergency department immediately.  Patient education was provided. Patient verbalized understanding of information provided and agrees with plan of care (POC), all questions answered. The patient is advised to call or return to clinic if condition does not see an improvement in symptoms, or to seek the care of the closest emergency department if condition worsens with the above plan.    1. Acute bronchitis, unspecified organism  - methylPREDNISolone (MEDROL DOSEPAK) 4 MG TBPK tablet; Take as directed.  Dispense: 21 tablet; Refill: 0 -Take medication as prescribed. -Ibuprofen or Tylenol for pain, fever, or general discomfort. -Increase fluids. -Sleep elevated on at least 2 pillows at bedtime to help with cough. -Use a humidifier or vaporizer when at home and during sleep to help with cough. -May use a teaspoon of honey or over-the-counter cough drops to help with cough. -Follow-up with PCP office on  Monday to ensure referral is completed for pulmonology.  If there is any concern to include [place] of breath, difficulty breathing, or other concerns, please go to the emergency department immediately.

## 2018-10-28 ENCOUNTER — Ambulatory Visit (INDEPENDENT_AMBULATORY_CARE_PROVIDER_SITE_OTHER): Payer: Medicare Other | Admitting: Pulmonary Disease

## 2018-10-28 ENCOUNTER — Encounter: Payer: Self-pay | Admitting: Pulmonary Disease

## 2018-10-28 VITALS — BP 126/88 | HR 85 | Ht 63.0 in | Wt 190.0 lb

## 2018-10-28 DIAGNOSIS — J069 Acute upper respiratory infection, unspecified: Secondary | ICD-10-CM

## 2018-10-28 DIAGNOSIS — J302 Other seasonal allergic rhinitis: Secondary | ICD-10-CM | POA: Diagnosis not present

## 2018-10-28 DIAGNOSIS — R058 Other specified cough: Secondary | ICD-10-CM

## 2018-10-28 DIAGNOSIS — R05 Cough: Secondary | ICD-10-CM

## 2018-10-28 DIAGNOSIS — Z872 Personal history of diseases of the skin and subcutaneous tissue: Secondary | ICD-10-CM | POA: Diagnosis not present

## 2018-10-28 DIAGNOSIS — R059 Cough, unspecified: Secondary | ICD-10-CM

## 2018-10-28 MED ORDER — AEROCHAMBER MV MISC: 0 refills | Status: DC

## 2018-10-28 NOTE — Patient Instructions (Addendum)
Thank you for visiting Dr. Valeta Harms at [facility]. Today we recommend the following: Orders Placed This Encounter  Procedures  . CT CHEST LIMITED WO CONTRAST  . Pulmonary function test   Meds ordered this encounter  Medications  . Spacer/Aero-Holding Chambers (AEROCHAMBER MV) inhaler    Sig: Use as instructed    Dispense:  1 each    Refill:  0   Return in about 6 weeks (around 12/09/2018).

## 2018-10-28 NOTE — Progress Notes (Signed)
Synopsis: Referred in Jan 2020 for cough by Inda Coke, PA  Subjective:   PATIENT ID: Margaret White GENDER: female DOB: Apr 30, 1951, MRN: [id number]  Chief Complaint  Patient presents with  . Consult    Consult for Cough. States she had Bronchitis and PNE in november. She developed the cough then and it will not go away. She does wear a mouth guard for her sleep apnea at night. States her cough is intermittently productive and her mucous is thick and clear to yellow.     C/o cough since November. She had pneumonia in November. She has does ring up sputum, white at times. She Has had sinus infection, followed by multiple bouts of bronchitis.  She also has been told several times that she has had pneumonia.  Each time treated with antibiotics and steroids.She routinely frequents a local urgent care with the symptoms.  She states that she has been on antibiotics and steroids  At least once per month since September 2019. She does have some allergies to trees and cats. She had skin testing in the past. She has been on 3-4 rounds of antibiotics. Husband and ex-husband, 45+ years of second hand smoke exposure, basement or outside.  She currently works as a Education officer, museum in [place].  She is originally from outside of [place].  Patient denies hemoptysis.  Also when discussing the symptoms that brings her into urgent care they predominantly surround nasal and sinus pressure.  She has ongoing wet cough which is productive of sputum.  She may have had wheezing during this time but is unsure.  Further questioning reveals that she has been on antibiotics and steroids at least 2-3 times per year for the past several years.  This past fall has been the worst.She does notice her symptoms are worse in fall and spring.   Past Medical History:  Diagnosis Date  . Abnormal LFTs 10/14/2017  . Allergic rhinitis 10/14/2017  . Anxiety   . Avascular necrosis ([place]) 03/24/2018  . Avascular necrosis of  hip, right ([place]) 03/24/2018  . Chronic maxillary sinusitis 10/14/2017  . Essential (primary) hypertension 06/26/2017  . Essential hypertension 06/26/2017   Last Assessment & Plan:  Low sodium diet  Check labs and monitor  Continue meds  . Female cystocele 10/14/2017  . Fibromyalgia 10/14/2017  . GERD (gastroesophageal reflux disease) 10/14/2017  . Headache    cluster HA  . Hepatic steatosis 10/14/2017  . 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 History of cluster headache 10/14/2017  . IBS (irritable bowel syndrome) 10/14/2017  . 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 OSA (obstructive sleep apnea) 10/14/2017  . 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 Pneumonia   . Postmenopausal 10/14/2017  . Primary osteoarthritis of both hands 07/10/2017  . Recurrent major depressive disorder, in full remission ([place]) 06/26/2017  . RLS (restless legs syndrome) 10/14/2017   not diagnosed  . Seizures ([place])    2006  x1  . 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 Shallow disc bulging without significant neuroforaminal stenosis.     Family History  Problem Relation Age of Onset  . Hypertension Mother   . Cancer Father   . Heart  disease Father      Past Surgical History:  Procedure Laterality Date  . atopic pregnancy    . BREAST BIOPSY Right    approximately 10 years ago/ benign    . BREAST BIOPSY Right   . CARPAL TUNNEL RELEASE     bil  . EYE SURGERY     as a child eyes went out, Bil cataracts 2019  . JOINT REPLACEMENT     Right total hip Dr. Zollie Beckers  . LAPAROSCOPIC TOTAL HYSTERECTOMY    . NASAL SEPTUM SURGERY    . prolapsed bladder    . RHINOPLASTY    . TOTAL HIP ARTHROPLASTY  Right 04/11/2018   Procedure: RIGHT TOTAL HIP ARTHROPLASTY ANTERIOR APPROACH;  Surgeon: Mcarthur Rossetti, MD;  Location: [facility];  Service: Orthopedics;  Laterality: Right;  . tummy tuck    . ulnar nerve relocation     left    Social History   Socioeconomic History  . Marital status: Married    Spouse name: Not on file  . Number of children: Not on file  . Years of education: Not on file  . Highest education level: Not on file  Occupational History  . Occupation: Product manager: [employer]  Social Needs  . Financial resource strain: Not on file  . Food insecurity:    Worry: Not on file    Inability: Not on file  . Transportation needs:    Medical: Not on file    Non-medical: Not on file  Tobacco Use  . Smoking status: Never Smoker  . Smokeless tobacco: Never Used  Substance and Sexual Activity  . Alcohol use: No    Frequency: Never  . Drug use: No  . Sexual activity: Yes  Lifestyle  . Physical activity:    Days per week: Not on file    Minutes per session: Not on file  . Stress: Not on file  Relationships  . Social connections:    Talks on phone: Not on file    Gets together: Not on file    Attends religious service: Not on file    Active member of club or organization: Not on file    Attends meetings of clubs or organizations: Not on file    Relationship status: Not on file  . Intimate partner violence:    Fear of current or ex partner: Not on file    Emotionally abused: Not on file    Physically abused: Not on file    Forced sexual activity: Not on file  Other Topics Concern  . Not on file  Social History Narrative  . Not on file     Allergies  Allergen Reactions  . Codeine Itching  . Sulfa Antibiotics Rash and Other (See Comments)    Flu like symptoms     Outpatient Medications Prior to Visit  Medication Sig Dispense Refill  . albuterol (PROVENTIL HFA;VENTOLIN HFA) 108 (90 Base) MCG/ACT inhaler Inhale 2 puffs into the lungs  every 6 (six) hours as needed for wheezing or shortness of breath. 1 Inhaler 0  . amLODipine (NORVASC) 5 MG tablet Take 1 tablet (5 mg total) by mouth daily. 90 tablet 2  . aspirin-acetaminophen-caffeine (EXCEDRIN MIGRAINE) 250-250-65 MG tablet Take 2 tablets by mouth every 8 (eight) hours as needed for headache.    Marland Kitchen FLUoxetine (PROZAC) 20 MG tablet Take 1 tablet (20 mg total) by mouth daily. 90 tablet 1  . fluticasone (FLONASE) 50 MCG/ACT nasal spray  Place 1 spray into both nostrils daily. (Patient taking differently: Place 1 spray into both nostrils daily as needed for allergies. ) 16 g 3  . fluticasone-salmeterol (ADVAIR HFA) 115-21 MCG/ACT inhaler Inhale 2 puffs into the lungs 2 (two) times daily. 1 Inhaler 12  . loperamide (IMODIUM) 2 MG capsule Take 2 mg by mouth daily. For IBS    . loratadine (CLARITIN) 10 MG tablet Take 10 mg by mouth daily.    . Mirabegron (MYRBETRIQ PO) Take 25 mg by mouth daily.    . montelukast (SINGULAIR) 10 MG tablet Take 1 tablet (10 mg total) by mouth at bedtime. (Patient taking differently: Take 10 mg by mouth daily. ) 90 tablet 3  . doxycycline (VIBRA-TABS) 100 MG tablet Take 1 tablet (100 mg total) by mouth 2 (two) times daily. 20 tablet 0   No facility-administered medications prior to visit.     Review of Systems  Constitutional: Negative for chills, fever, malaise/fatigue and weight loss.  HENT: Negative for hearing loss, sore throat and tinnitus.   Eyes: Negative for blurred vision and double vision.  Respiratory: Positive for cough, sputum production and shortness of breath. Negative for hemoptysis, wheezing and stridor.   Cardiovascular: Negative for chest pain, palpitations, orthopnea, leg swelling and PND.  Gastrointestinal: Negative for abdominal pain, constipation, diarrhea, heartburn, nausea and vomiting.  Genitourinary: Negative for dysuria, hematuria and urgency.  Musculoskeletal: Negative for joint pain and myalgias.  Skin: Negative for  itching and rash.  Neurological: Negative for dizziness, tingling, weakness and headaches.  Endo/Heme/Allergies: Negative for environmental allergies. Does not bruise/bleed easily.  Psychiatric/Behavioral: Negative for depression. The patient is not nervous/anxious and does not have insomnia.   All other systems reviewed and are negative.    Objective:  Physical Exam Vitals signs reviewed.  Constitutional:      General: She is not in acute distress.    Appearance: She is well-developed.  HENT:     Head: Normocephalic and atraumatic.  Eyes:     General: No scleral icterus.    Conjunctiva/sclera: Conjunctivae normal.     Pupils: Pupils are equal, round, and reactive to light.  Neck:     Musculoskeletal: Neck supple.     Vascular: No JVD.     Trachea: No tracheal deviation.  Cardiovascular:     Rate and Rhythm: Normal rate and regular rhythm.     Heart sounds: Normal heart sounds. No murmur.  Pulmonary:     Effort: Pulmonary effort is normal. No tachypnea, accessory muscle usage or respiratory distress.     Breath sounds: Normal breath sounds. No stridor. No wheezing, rhonchi or rales.  Abdominal:     General: Bowel sounds are normal. There is no distension.     Palpations: Abdomen is soft.     Tenderness: There is no abdominal tenderness.  Musculoskeletal:        General: No tenderness.  Lymphadenopathy:     Cervical: No cervical adenopathy.  Skin:    General: Skin is warm and dry.     Capillary Refill: Capillary refill takes less than 2 seconds.     Findings: No rash.  Neurological:     Mental Status: She is alert and oriented to person, place, and time.  Psychiatric:        Behavior: Behavior normal.      Vitals:   10/28/18 0921  BP: 126/88  Pulse: 85  SpO2: 96%  Weight: 190 lb (86.2 kg)  Height: 5' 3"  (1.6 m)  96% on RA BMI Readings from Last 3 Encounters:  10/28/18 33.66 kg/m  10/11/18 32.77 kg/m  10/06/18 32.24 kg/m   Wt Readings from Last 3  Encounters:  10/28/18 190 lb (86.2 kg)  10/11/18 185 lb (83.9 kg)  10/06/18 182 lb (82.6 kg)     CBC    Component Value Date/Time   WBC 6.5 08/08/2018 1318   RBC 4.46 08/08/2018 1318   HGB 12.6 08/08/2018 1318   HCT 41.2 08/08/2018 1318   PLT 279 08/08/2018 1318   MCV 92.4 08/08/2018 1318   MCH 28.3 08/08/2018 1318   MCHC 30.6 08/08/2018 1318   RDW 14.7 08/08/2018 1318   LYMPHSABS 1.9 08/08/2018 1318   MONOABS 0.5 08/08/2018 1318   EOSABS 0.2 08/08/2018 1318   BASOSABS 0.0 08/08/2018 1318    Chest Imaging: 10/06/2018 chest x-ray: Basilar linear opacities, present on previous 3 chest x-rays The patient's images have been independently reviewed by me.    Pulmonary Functions Testing Results: No flowsheet data found.  FeNO: None   Pathology: None   Echocardiogram:  06/26/2018 echocardiogram: Preserved ejection fraction 60 to 65%, mild MR, grade 1 diastolic dysfunction  Heart Catheterization: None     Assessment & Plan:   Cough - Plan: Pulmonary function test, CT CHEST WO CONTRAST, CANCELED: CT CHEST LIMITED WO CONTRAST  Sputum production  Seasonal allergies  Recurrent upper respiratory infection (URI)  Hx of eczema  Discussion:  This is a 68 year old female with a history of a recurrent upper respiratory, sinus and bronchial symptoms ongoing.  She does have cough since this past November.  She states that she has been diagnosed with pneumonia in the past.  And has been given multiple bouts of steroids and antibiotics.  Per the patient she has taken an antibiotic and a steroid every month since September.  She just recently completed her last steroid taper last week.  After review of chest imaging available here in PACS she does not have any chest x-ray that shows a infiltrate.  Each time she states that she has never had a fever.  I am not convinced that she has had recurrent pneumonia.  She does not have any risk factor for immunosuppression.  I do believe she  has suffered some complications from being placed on prednisone so many times in her life.  She has had avascular necrosis of the hip as well as cataracts requiring surgery.  I think we need to approach her chronic cough and sputum production systematically.  Due to her somewhat atopic history, prior positive skin allergy testing and history of eczema we may be dealing with recurrent chronic bronchitis and asthma type symptoms.  She does have a significant secondhand smoke exposure.  We will start by obtaining full pulmonary function test We will obtain a noncontrasted CT of the chest for further evaluation of the linear opacity seen on chest x-ray as well as the lateral opacity within the right middle lobe.  Would need to rule out any evidence of NTM.  She can continue her Advair HFA.  We will give her spacer and instruct her how to use this.  Patient to follow-up in our clinic in 4 weeks.  Greater than 50% of this patient's 60-minute of visit was spent face-to-face reviewing chest x-ray imaging and reviewing this above outlined stepwise approach.   Current Outpatient Medications:  .  albuterol (PROVENTIL HFA;VENTOLIN HFA) 108 (90 Base) MCG/ACT inhaler, Inhale 2 puffs into the lungs every 6 (six) hours as needed  for wheezing or shortness of breath., Disp: 1 Inhaler, Rfl: 0 .  amLODipine (NORVASC) 5 MG tablet, Take 1 tablet (5 mg total) by mouth daily., Disp: 90 tablet, Rfl: 2 .  aspirin-acetaminophen-caffeine (EXCEDRIN MIGRAINE) 250-250-65 MG tablet, Take 2 tablets by mouth every 8 (eight) hours as needed for headache., Disp: , Rfl:  .  FLUoxetine (PROZAC) 20 MG tablet, Take 1 tablet (20 mg total) by mouth daily., Disp: 90 tablet, Rfl: 1 .  fluticasone (FLONASE) 50 MCG/ACT nasal spray, Place 1 spray into both nostrils daily. (Patient taking differently: Place 1 spray into both nostrils daily as needed for allergies. ), Disp: 16 g, Rfl: 3 .  fluticasone-salmeterol (ADVAIR HFA) 115-21 MCG/ACT  inhaler, Inhale 2 puffs into the lungs 2 (two) times daily., Disp: 1 Inhaler, Rfl: 12 .  loperamide (IMODIUM) 2 MG capsule, Take 2 mg by mouth daily. For IBS, Disp: , Rfl:  .  loratadine (CLARITIN) 10 MG tablet, Take 10 mg by mouth daily., Disp: , Rfl:  .  Mirabegron (MYRBETRIQ PO), Take 25 mg by mouth daily., Disp: , Rfl:  .  montelukast (SINGULAIR) 10 MG tablet, Take 1 tablet (10 mg total) by mouth at bedtime. (Patient taking differently: Take 10 mg by mouth daily. ), Disp: 90 tablet, Rfl: 3   Garner Nash, DO [facility] 10/28/2018 9:30 AM

## 2018-10-31 ENCOUNTER — Ambulatory Visit: Payer: Medicare Other | Admitting: Family Medicine

## 2018-10-31 DIAGNOSIS — R05 Cough: Secondary | ICD-10-CM | POA: Diagnosis not present

## 2018-10-31 DIAGNOSIS — R911 Solitary pulmonary nodule: Secondary | ICD-10-CM | POA: Diagnosis not present

## 2018-10-31 DIAGNOSIS — R918 Other nonspecific abnormal finding of lung field: Secondary | ICD-10-CM | POA: Diagnosis not present

## 2018-10-31 DIAGNOSIS — K760 Fatty (change of) liver, not elsewhere classified: Secondary | ICD-10-CM | POA: Diagnosis not present

## 2018-11-14 ENCOUNTER — Telehealth: Payer: Self-pay | Admitting: Pulmonary Disease

## 2018-11-14 ENCOUNTER — Other Ambulatory Visit: Payer: Medicare Other

## 2018-11-14 DIAGNOSIS — R911 Solitary pulmonary nodule: Secondary | ICD-10-CM

## 2018-11-14 DIAGNOSIS — R918 Other nonspecific abnormal finding of lung field: Secondary | ICD-10-CM

## 2018-11-14 NOTE — Telephone Encounter (Signed)
Pt showed up at office to have labs drawn and there were no orders or mention of it in OV note orr phone note.  Dr Valeta Harms please advise.

## 2018-11-14 NOTE — Telephone Encounter (Signed)
Not sure which labs pt was referring to, but pt mentioned something about you wanted her to finish the prednisone first and then have labs drawn.

## 2018-11-14 NOTE — Telephone Encounter (Signed)
PCCM: Received CT report from [facility].  49m lung nodule. Will need 12 month Non-contrast CT follow up.   [facility] 11/14/2018 11:12 AM

## 2018-11-14 NOTE — Telephone Encounter (Signed)
We would normally order rast ige and CBC w/diff after the steroids had cleared. You can let her know that we can wait until her next office visit. Sorry for any confusion we created.   Thanks  Leory Plowman

## 2018-11-14 NOTE — Telephone Encounter (Signed)
PCCM: Unsure, no orders per last office note. Just PFTs and CT.   Was this labs a different physician or primary care provider ordered? Garner Nash, DO [facility] 11/14/2018 2:27 PM

## 2018-11-17 NOTE — Telephone Encounter (Signed)
Left message informing patient we do not need labs as of right now and we would address at next OV. Nothing further is needed at this time.

## 2018-11-17 NOTE — Telephone Encounter (Signed)
Left message informing patient we would repeat chest ct Jan of 2021. Nothing further is needed at this time.

## 2018-12-03 ENCOUNTER — Ambulatory Visit (INDEPENDENT_AMBULATORY_CARE_PROVIDER_SITE_OTHER): Payer: Medicare Other

## 2018-12-03 ENCOUNTER — Encounter (INDEPENDENT_AMBULATORY_CARE_PROVIDER_SITE_OTHER): Payer: Self-pay | Admitting: Orthopaedic Surgery

## 2018-12-03 ENCOUNTER — Ambulatory Visit (INDEPENDENT_AMBULATORY_CARE_PROVIDER_SITE_OTHER): Payer: Medicare Other | Admitting: Orthopaedic Surgery

## 2018-12-03 DIAGNOSIS — Z96641 Presence of right artificial hip joint: Secondary | ICD-10-CM | POA: Diagnosis not present

## 2018-12-03 NOTE — Progress Notes (Signed)
HPI: Margaret White returns now 8 months status post right total hip arthroplasty.  She states she is overall doing great.  She has occasional burning sensation posterior aspect of the right hip with past prolonged walking this does not happen daily.  She has no pain when she lies on the hip.  She is very happy with the results of the hip.  States her leg lengths are equal.  Physical exam: General well-developed well-nourished female no acute distress mood and affect appropriate  Right hip: Full internal and external rotation without pain.  Ambulates without any assistive device.  Right calf supple nontender dorsiflexion plantarflexion right ankle intact.  Radiographs: AP pelvis and lateral view of the right hip shows well-seated right total hip arthroplasty components.  No acute fractures.  No other bony abnormalities.  Right hip is well located.  Impression: Status post right total hip arthroplasty 04/11/2018  Plan: She will follow-up on as-needed basis.  Questions are encouraged and answered at length.

## 2018-12-09 ENCOUNTER — Ambulatory Visit (INDEPENDENT_AMBULATORY_CARE_PROVIDER_SITE_OTHER): Payer: Medicare Other | Admitting: Family Medicine

## 2018-12-09 ENCOUNTER — Encounter: Payer: Self-pay | Admitting: Family Medicine

## 2018-12-09 VITALS — BP 130/74 | HR 76 | Temp 98.6°F | Ht 63.0 in | Wt 191.8 lb

## 2018-12-09 DIAGNOSIS — E162 Hypoglycemia, unspecified: Secondary | ICD-10-CM | POA: Diagnosis not present

## 2018-12-09 DIAGNOSIS — E8881 Metabolic syndrome: Secondary | ICD-10-CM | POA: Diagnosis not present

## 2018-12-09 DIAGNOSIS — R0602 Shortness of breath: Secondary | ICD-10-CM

## 2018-12-09 DIAGNOSIS — R053 Chronic cough: Secondary | ICD-10-CM | POA: Insufficient documentation

## 2018-12-09 DIAGNOSIS — K219 Gastro-esophageal reflux disease without esophagitis: Secondary | ICD-10-CM | POA: Diagnosis not present

## 2018-12-09 DIAGNOSIS — G4733 Obstructive sleep apnea (adult) (pediatric): Secondary | ICD-10-CM | POA: Diagnosis not present

## 2018-12-09 DIAGNOSIS — K760 Fatty (change of) liver, not elsewhere classified: Secondary | ICD-10-CM | POA: Diagnosis not present

## 2018-12-09 DIAGNOSIS — E669 Obesity, unspecified: Secondary | ICD-10-CM

## 2018-12-09 DIAGNOSIS — R05 Cough: Secondary | ICD-10-CM | POA: Diagnosis not present

## 2018-12-09 LAB — CBC WITH DIFFERENTIAL/PLATELET
Basophils Absolute: 0 10*3/uL (ref 0.0–0.1)
Basophils Relative: 0.8 % (ref 0.0–3.0)
Eosinophils Absolute: 0.3 10*3/uL (ref 0.0–0.7)
Eosinophils Relative: 4.9 % (ref 0.0–5.0)
HCT: 41.6 % (ref 36.0–46.0)
Hemoglobin: 13.8 g/dL (ref 12.0–15.0)
Lymphocytes Relative: 28.8 % (ref 12.0–46.0)
Lymphs Abs: 1.7 10*3/uL (ref 0.7–4.0)
MCHC: 33.3 g/dL (ref 30.0–36.0)
MCV: 86.1 fl (ref 78.0–100.0)
Monocytes Absolute: 0.4 10*3/uL (ref 0.1–1.0)
Monocytes Relative: 7.3 % (ref 3.0–12.0)
Neutro Abs: 3.4 10*3/uL (ref 1.4–7.7)
Neutrophils Relative %: 58.2 % (ref 43.0–77.0)
Platelets: 297 10*3/uL (ref 150.0–400.0)
RBC: 4.83 Mil/uL (ref 3.87–5.11)
RDW: 16.1 % — ABNORMAL HIGH (ref 11.5–15.5)
WBC: 5.9 10*3/uL (ref 4.0–10.5)

## 2018-12-09 LAB — HEMOGLOBIN A1C: Hgb A1c MFr Bld: 6.2 % (ref 4.6–6.5)

## 2018-12-09 LAB — COMPREHENSIVE METABOLIC PANEL
ALT: 40 U/L — ABNORMAL HIGH (ref 0–35)
AST: 27 U/L (ref 0–37)
Albumin: 4.2 g/dL (ref 3.5–5.2)
Alkaline Phosphatase: 83 U/L (ref 39–117)
BUN: 14 mg/dL (ref 6–23)
CO2: 27 mEq/L (ref 19–32)
Calcium: 9.4 mg/dL (ref 8.4–10.5)
Chloride: 103 mEq/L (ref 96–112)
Creatinine, Ser: 0.89 mg/dL (ref 0.40–1.20)
GFR: 63.41 mL/min (ref >60.00)
Glucose, Bld: 114 mg/dL — ABNORMAL HIGH (ref 70–99)
Potassium: 3.6 mEq/L (ref 3.5–5.1)
Sodium: 141 mEq/L (ref 135–145)
Total Bilirubin: 0.4 mg/dL (ref 0.2–1.2)
Total Protein: 6.6 g/dL (ref 6.0–8.3)

## 2018-12-09 LAB — POCT H PYLORI SCREEN: H Pylori Screen, POC: NEGATIVE

## 2018-12-09 MED ORDER — ESOMEPRAZOLE MAGNESIUM 40 MG PO CPDR: 40.0000 mg | DELAYED_RELEASE_CAPSULE | Freq: Every day | ORAL | 3 refills | Status: DC

## 2018-12-09 NOTE — Progress Notes (Signed)
Margaret White is a 68 y.o. female is here for follow up.  Assessment and Plan:  Obesity (BMI 30-39.9) Wt Readings from Last 3 Encounters:  12/09/18 191 lb 12.8 oz (87 kg)  10/28/18 190 lb (86.2 kg)  10/11/18 185 lb (83.9 kg)   Patient does not currently exercise or watch her food intake.  She has noticed some shortness of breath with exertion.  She does endorse some mild edema in her feet at night.  This resolves by morning.  Insulin resistance Lab Results  Component Value Date   HGBA1C 6.0 07/30/2018   Lab Results  Component Value Date   ALT 27 07/30/2018   AST 18 07/30/2018   ALKPHOS 76 07/30/2018   BILITOT 0.3 07/30/2018   Patient with hepatic steatosis on imaging.  She was offered metformin by me several months ago but has declined.  She is not exercising.  We discussed plant-based, low carbohydrate diets to work on this.  Hepatic steatosis Due for repeat ultrasound.  GERD (gastroesophageal reflux disease) Patient with history of dyspepsia, diagnosed as reflux.  She was previously taking Nexium.  She stopped taking because she was worried that the risks may outweigh the benefit of medication.  She does endorse dyspepsia and admits to having one episode of vomiting immediately after eating.  She does endorse the sour taste in her mouth.  This may be contributing to her cough.  She denies overt epigastric pain.  No radiation of pain to her back.  No history of EGD.  She denies having issues with eating or choking on food.  Will refer to gastroenterology for evaluation and likely EGD.  Will obtain H. pylori test today and start Nexium.  OSA (obstructive sleep apnea) History of obstructive sleep apnea and using a mouthguard.  She was unable to tolerate the CPAP.  Chronic cough Patient has follow-up tomorrow with pulmonology.  She will have pulmonary function testing completed.  Current trial of Advair. Does not feel that it is helping. Taking Claritin, Singulair. Has Flonase  but not taking consistently.  Discussed taking Flonase more consistently for allergies. Will restart PPI. H pylori test today. Sending to GI for possible EGD. CT reviewed. With SOB with exertion, denies CP, N/V. EKG today. EKG: unchanged from previous tracings, normal sinus rhythm.   Orders Placed This Encounter  Procedures  . US ABDOMEN LIMITED RUQ  . CBC with Differential/Platelet  . Comprehensive metabolic panel  . Hemoglobin A1c  . Ambulatory referral to Gastroenterology  . H.pylori screen, POC  . EKG 12-Lead   Meds ordered this encounter  Medications  . esomeprazole (NEXIUM) 40 MG capsule    Sig: Take 1 capsule (40 mg total) by mouth daily.    Dispense:  30 capsule    Refill:  3   Significant Labs:   Results for orders placed or performed in visit on 12/09/18  H.pylori screen, POC  Result Value Ref Range   H Pylori Screen, POC Negative Negative   Health Maintenance:  There are no preventive care reminders to display for this patient. Depression screen [facility] 2/9 12/09/2018 03/24/2018 11/19/2017  Decreased Interest 0 3 0  Down, Depressed, Hopeless 0 0 0  PHQ - 2 Score 0 3 0  Altered sleeping 1 1 -  Tired, decreased energy 3 3 -  Change in appetite 2 0 -  Feeling bad or failure about yourself  0 0 -  Trouble concentrating 0 0 -  Moving slowly or fidgety/restless 0 0 -  Suicidal thoughts 0 0 -  PHQ-9 Score 6 7 -  Difficult doing work/chores Somewhat difficult Not difficult at all -   PMHx, SurgHx, SocialHx, FamHx, Medications, and Allergies were reviewed in the Visit Navigator and updated as appropriate.   Patient Active Problem List   Diagnosis Date Noted  . Chronic cough 12/09/2018  . SOB (shortness of breath) on exertion 05/14/2018  . Hypokalemia 05/14/2018  . Incontinence without sensory awareness 05/08/2018  . Mixed incontinence 05/08/2018  . 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 Statin intolerance 03/25/2018  .  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 Avascular necrosis ([place]) 03/24/2018  . Avascular necrosis of hip, right ([place]) 03/24/2018  . 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 GERD (gastroesophageal reflux disease) 10/14/2017  . Postmenopausal 10/14/2017  . Allergic rhinitis 10/14/2017  . Abnormal LFTs 10/14/2017  . Hepatic steatosis 10/14/2017  . OSA (obstructive sleep apnea) 10/14/2017  . RLS (restless legs syndrome) 10/14/2017  . History of cluster headache 10/14/2017  . IBS (irritable bowel syndrome) 10/14/2017  . Fibromyalgia 10/14/2017  . Chronic maxillary sinusitis 10/14/2017  . Pelvic pain 10/14/2017  . Obesity (BMI 30-39.9) 10/14/2017  . Mixed hyperlipidemia 10/14/2017  . Vitamin B12 deficiency 10/14/2017  . Vitamin D deficiency 10/14/2017  . Female cystocele 10/14/2017  . Primary osteoarthritis of both hands 07/10/2017  . Essential hypertension 06/26/2017  . Recurrent major depressive disorder, in full remission ([place]) 06/26/2017   Social History   Tobacco Use  . Smoking status: Never Smoker  . Smokeless tobacco: Never Used  Substance Use Topics  . Alcohol use: No    Frequency: Never  . Drug use: No   Current Medications and Allergies:   .  albuterol (PROVENTIL HFA;VENTOLIN HFA) 108 (90 Base) MCG/ACT inhaler, Inhale 2 puffs into the lungs every 6 (six) hours as needed for wheezing or shortness of breath., Disp: 1 Inhaler, Rfl: 0 .  amLODipine (NORVASC) 5 MG tablet, Take 1 tablet (5 mg total) by mouth daily., Disp: 90 tablet, Rfl: 2 .  aspirin-acetaminophen-caffeine (EXCEDRIN MIGRAINE) 250-250-65 MG tablet, Take 2 tablets by mouth every 8 (eight) hours as needed for headache., Disp: , Rfl:  .  FLUoxetine (PROZAC) 20 MG tablet, Take 1 tablet (20 mg total) by mouth daily., Disp: 90 tablet, Rfl: 1 .  fluticasone (FLONASE) 50 MCG/ACT nasal spray, Place 1 spray into both  nostrils daily. (Patient taking differently: Place 1 spray into both nostrils daily as needed for allergies. ), Disp: 16 g, Rfl: 3 .  fluticasone-salmeterol (ADVAIR HFA) 115-21 MCG/ACT inhaler, Inhale 2 puffs into the lungs 2 (two) times daily., Disp: 1 Inhaler, Rfl: 12 .  loperamide (IMODIUM) 2 MG capsule, Take 2 mg by mouth daily. For IBS, Disp: , Rfl:  .  loratadine (CLARITIN) 10 MG tablet, Take 10 mg by mouth daily., Disp: , Rfl:  .  Mirabegron (MYRBETRIQ PO), Take 25 mg by mouth daily., Disp: , Rfl:  .  montelukast (SINGULAIR) 10 MG tablet, Take 1 tablet (10 mg total) by mouth at bedtime. (Patient taking differently: Take 10 mg by mouth daily. ), Disp: 90 tablet, Rfl: 3 .  Spacer/Aero-Holding Chambers (AEROCHAMBER MV) inhaler, Use as instructed, Disp: 1 each, Rfl: 0    Allergies  Allergen Reactions  . Codeine Itching  . Sulfa Antibiotics Rash and Other (See Comments)    Flu like symptoms   Review  of Systems   Pertinent items are noted in the HPI. Otherwise, ROS is negative.  Vitals:   Vitals:   12/09/18 1416  BP: 130/74  Pulse: 76  Temp: 98.6 F (37 C)  TempSrc: Oral  SpO2: 98%  Weight: 191 lb 12.8 oz (87 kg)  Height: 5' 3"  (1.6 m)     Body mass index is 33.98 kg/m.   Physical Exam:   Physical Exam Vitals signs and nursing note reviewed.  Constitutional:      General: She is not in acute distress.    Appearance: She is obese.  HENT:     Head: Normocephalic and atraumatic.     Right Ear: Tympanic membrane normal.     Left Ear: Tympanic membrane normal.     Nose: Nose normal.     Mouth/Throat:     Mouth: Mucous membranes are moist.  Eyes:     Pupils: Pupils are equal, round, and reactive to light.  Neck:     Musculoskeletal: Normal range of motion and neck supple.  Cardiovascular:     Rate and Rhythm: Normal rate and regular rhythm.  Pulmonary:     Effort: Pulmonary effort is normal.     Breath sounds: No wheezing or rhonchi.  Abdominal:     Palpations:  Abdomen is soft.  Skin:    General: Skin is warm.  Neurological:     General: No focal deficit present.     Mental Status: She is alert.  Psychiatric:        Behavior: Behavior normal.    . Reviewed expectations re: course of current medical issues. . Discussed self-management of symptoms. . Outlined signs and symptoms indicating need for more acute intervention. . Patient verbalized understanding and all questions were answered. Marland Kitchen Health Maintenance issues including appropriate healthy diet, exercise, and smoking avoidance were discussed with patient. . See orders for this visit as documented in the electronic medical record. . Patient received an After Visit Summary.  Briscoe Deutscher, DO [place], [facility] 12/09/2018

## 2018-12-09 NOTE — Assessment & Plan Note (Signed)
Due for repeat ultrasound.

## 2018-12-09 NOTE — Assessment & Plan Note (Signed)
History of obstructive sleep apnea and using a mouthguard.  She was unable to tolerate the CPAP.

## 2018-12-09 NOTE — Assessment & Plan Note (Signed)
Wt Readings from Last 3 Encounters:  12/09/18 191 lb 12.8 oz (87 kg)  10/28/18 190 lb (86.2 kg)  10/11/18 185 lb (83.9 kg)   Patient does not currently exercise or watch her food intake.  She has noticed some shortness of breath with exertion.  She does endorse some mild edema in her feet at night.  This resolves by morning.

## 2018-12-09 NOTE — Assessment & Plan Note (Addendum)
Patient has follow-up tomorrow with pulmonology.  She will have pulmonary function testing completed.  Current trial of Advair. Does not feel that it is helping. Taking Claritin, Singulair. Has Flonase but not taking consistently.  Discussed taking Flonase more consistently for allergies. Will restart PPI. H pylori test today. Sending to GI for possible EGD. CT reviewed. With SOB with exertion, denies CP, N/V. EKG today. EKG: unchanged from previous tracings, normal sinus rhythm.

## 2018-12-09 NOTE — Assessment & Plan Note (Signed)
Lab Results  Component Value Date   HGBA1C 6.0 07/30/2018   Lab Results  Component Value Date   ALT 27 07/30/2018   AST 18 07/30/2018   ALKPHOS 76 07/30/2018   BILITOT 0.3 07/30/2018   Patient with hepatic steatosis on imaging.  She was offered metformin by me several months ago but has declined.  She is not exercising.  We discussed plant-based, low carbohydrate diets to work on this.

## 2018-12-09 NOTE — Assessment & Plan Note (Addendum)
Patient with history of dyspepsia, diagnosed as reflux.  She was previously taking Nexium.  She stopped taking because she was worried that the risks may outweigh the benefit of medication.  She does endorse dyspepsia and admits to having one episode of vomiting immediately after eating.  She does endorse the sour taste in her mouth.  This may be contributing to her cough.  She denies overt epigastric pain.  No radiation of pain to her back.  No history of EGD.  She denies having issues with eating or choking on food.  Will refer to gastroenterology for evaluation and likely EGD.  Will obtain H. pylori test today and start Nexium.

## 2018-12-10 ENCOUNTER — Encounter: Payer: Self-pay | Admitting: Family Medicine

## 2018-12-10 ENCOUNTER — Other Ambulatory Visit: Payer: Self-pay

## 2018-12-10 ENCOUNTER — Encounter: Payer: Self-pay | Admitting: Pulmonary Disease

## 2018-12-10 ENCOUNTER — Ambulatory Visit (INDEPENDENT_AMBULATORY_CARE_PROVIDER_SITE_OTHER): Payer: Medicare Other | Admitting: Pulmonary Disease

## 2018-12-10 VITALS — BP 110/80 | HR 87 | Ht 64.5 in | Wt 188.0 lb

## 2018-12-10 DIAGNOSIS — R0609 Other forms of dyspnea: Secondary | ICD-10-CM | POA: Diagnosis not present

## 2018-12-10 DIAGNOSIS — Z872 Personal history of diseases of the skin and subcutaneous tissue: Secondary | ICD-10-CM | POA: Diagnosis not present

## 2018-12-10 DIAGNOSIS — N3942 Incontinence without sensory awareness: Secondary | ICD-10-CM | POA: Diagnosis not present

## 2018-12-10 DIAGNOSIS — N3946 Mixed incontinence: Secondary | ICD-10-CM | POA: Diagnosis not present

## 2018-12-10 DIAGNOSIS — J302 Other seasonal allergic rhinitis: Secondary | ICD-10-CM

## 2018-12-10 DIAGNOSIS — R911 Solitary pulmonary nodule: Secondary | ICD-10-CM | POA: Diagnosis not present

## 2018-12-10 DIAGNOSIS — R05 Cough: Secondary | ICD-10-CM | POA: Diagnosis not present

## 2018-12-10 DIAGNOSIS — R059 Cough, unspecified: Secondary | ICD-10-CM

## 2018-12-10 LAB — PULMONARY FUNCTION TEST
DL/VA % pred: 98 %
DL/VA: 4.1 ml/min/mmHg/L
DLCO COR % PRED: 96 %
DLCO cor: 19.41 ml/min/mmHg
DLCO unc % pred: 97 %
DLCO unc: 19.65 ml/min/mmHg
FEF 25-75 POST: 4.13 L/s
FEF 25-75 Pre: 3.79 L/sec
FEF2575-%Change-Post: 9 %
FEF2575-%Pred-Post: 195 %
FEF2575-%Pred-Pre: 179 %
FEV1-%Change-Post: 1 %
FEV1-%Pred-Post: 110 %
FEV1-%Pred-Pre: 109 %
FEV1-Post: 2.69 L
FEV1-Pre: 2.67 L
FEV1FVC-%Change-Post: 4 %
FEV1FVC-%Pred-Pre: 115 %
FEV6-%Change-Post: -3 %
FEV6-%Pred-Post: 95 %
FEV6-%Pred-Pre: 98 %
FEV6-POST: 2.92 L
FEV6-Pre: 3.02 L
FEV6FVC-%Change-Post: 0 %
FEV6FVC-%PRED-POST: 104 %
FEV6FVC-%Pred-Pre: 104 %
FVC-%Change-Post: -3 %
FVC-%Pred-Post: 91 %
FVC-%Pred-Pre: 94 %
FVC-PRE: 3.02 L
FVC-Post: 2.92 L
Post FEV1/FVC ratio: 92 %
Post FEV6/FVC ratio: 100 %
Pre FEV1/FVC ratio: 88 %
Pre FEV6/FVC Ratio: 100 %
RV % pred: 84 %
RV: 1.79 L
TLC % pred: 96 %
TLC: 4.93 L

## 2018-12-10 MED ORDER — METFORMIN HCL ER 750 MG PO TB24: 750.0000 mg | ORAL_TABLET | Freq: Every day | ORAL | 1 refills | Status: DC

## 2018-12-10 NOTE — Patient Instructions (Addendum)
Thank you for visiting Dr. Valeta Harms at [facility]. Today we recommend the following:  Continue using your current inhaler regimen. Please call with any continued symptoms.  Please continue increasing your daily exercise regimen and building your exercise tolerance.   Return in about 1 year (around 12/10/2019), or if symptoms worsen or fail to improve.

## 2018-12-10 NOTE — Progress Notes (Signed)
PFT done today. 

## 2018-12-10 NOTE — Progress Notes (Signed)
Synopsis: Referred in Jan 2020 for cough by Briscoe Deutscher, DO  Subjective:   PATIENT ID: Margaret White GENDER: female DOB: 03-Feb-1951, MRN: [id number]  Chief Complaint  Patient presents with  . Follow-up    PDFT done today. She states the cough has improved but she is still very SOB.    C/o cough since November. She had pneumonia in November. She has does ring up sputum, white at times. She Has had sinus infection, followed by multiple bouts of bronchitis.  She also has been told several times that she has had pneumonia.  Each time treated with antibiotics and steroids.She routinely frequents a local urgent care with the symptoms.  She states that she has been on antibiotics and steroids  At least once per month since September 2019. She does have some allergies to trees and cats. She had skin testing in the past. She has been on 3-4 rounds of antibiotics. Husband and ex-husband, 45+ years of second hand smoke exposure, basement or outside.  She currently works as a Education officer, museum in [place].  She is originally from outside of [place].  Patient denies hemoptysis.  Also when discussing the symptoms that brings her into urgent care they predominantly surround nasal and sinus pressure.  She has ongoing wet cough which is productive of sputum.  She may have had wheezing during this time but is unsure.  Further questioning reveals that she has been on antibiotics and steroids at least 2-3 times per year for the past several years.  This past fall has been the worst.She does notice her symptoms are worse in fall and spring.  OV 12/10/2018: She is still noticing SOB and has noticed that it it worse with exercise of exertion. She has noticed that even doing the laundry has made it worse. She does know that he husband smokes in the basement and that were the laundry is. She does feel sob at work walking around the school.  Patient denies chest pain.  She does have shortness of breath with  bending over.  Prior echocardiogram with grade 1 diastolic dysfunction.  Her symptoms do go away after she exerts herself and then sits and rests.  She did have a hip replacement approximately 6 months ago and she says ever since then she feels like she has been more short of breath with significant amounts of exertion.  She does feel like it has set her back some from her prior exercise tolerance.   Past Medical History:  Diagnosis Date  . Abnormal LFTs 10/14/2017  . Allergic rhinitis 10/14/2017  . Anxiety   . Avascular necrosis ([place]) 03/24/2018  . Avascular necrosis of hip, right ([place]) 03/24/2018  . Chronic maxillary sinusitis 10/14/2017  . Essential (primary) hypertension 06/26/2017  . Essential hypertension 06/26/2017   Last Assessment & Plan:  Low sodium diet  Check labs and monitor  Continue meds  . Female cystocele 10/14/2017  . Fibromyalgia 10/14/2017  . GERD (gastroesophageal reflux disease) 10/14/2017  . Headache    cluster HA  . Hepatic steatosis 10/14/2017  . 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 History of cluster headache 10/14/2017  . IBS (irritable bowel syndrome) 10/14/2017  . 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 OSA (obstructive sleep apnea) 10/14/2017  . 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 Pneumonia   . Postmenopausal 10/14/2017  . Primary osteoarthritis of both hands 07/10/2017  . Recurrent major depressive disorder, in full remission ([place]) 06/26/2017  . RLS (restless legs syndrome) 10/14/2017   not diagnosed  . Seizures ([place])    2006  x1  . 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 Shallow disc bulging without significant neuroforaminal stenosis.     Family  History  Problem Relation Age of Onset  . Hypertension Mother   . Cancer Father   . Heart disease Father      Past Surgical History:  Procedure Laterality Date  . atopic pregnancy    . BREAST BIOPSY Right    approximately 10 years ago/ benign    . BREAST BIOPSY Right   . CARPAL TUNNEL RELEASE     bil  . EYE SURGERY     as a child eyes went out, Bil cataracts 2019  . JOINT REPLACEMENT     Right total hip Dr. Zollie Beckers  . LAPAROSCOPIC TOTAL HYSTERECTOMY    . NASAL SEPTUM SURGERY    . prolapsed bladder    . RHINOPLASTY    . TOTAL HIP ARTHROPLASTY Right 04/11/2018   Procedure: RIGHT TOTAL HIP ARTHROPLASTY ANTERIOR APPROACH;  Surgeon: Mcarthur Rossetti, MD;  Location: [facility];  Service: Orthopedics;  Laterality: Right;  . tummy tuck    . ulnar nerve relocation     left    Social History   Socioeconomic History  . Marital status: Married    Spouse name: Not on file  . Number of children: Not on file  . Years of education: Not on file  . Highest education level: Not on file  Occupational History  . Occupation: Product manager: [employer]  Social Needs  . Financial resource strain: Not on file  . Food insecurity:    Worry: Not on file    Inability: Not on file  . Transportation needs:    Medical: Not on file    Non-medical: Not on file  Tobacco Use  . Smoking status: Never Smoker  . Smokeless tobacco: Never Used  Substance and Sexual Activity  . Alcohol use: No    Frequency: Never  . Drug use: No  . Sexual activity: Yes  Lifestyle  . Physical activity:    Days per week: Not on file    Minutes per session: Not on file  . Stress: Not on file  Relationships  . Social connections:    Talks on phone: Not on file    Gets together: Not on file    Attends religious service: Not on file    Active member of club or organization: Not on file    Attends meetings of clubs or organizations: Not on file    Relationship status: Not on file  .  Intimate partner violence:    Fear of current or ex partner: Not on file    Emotionally abused: Not on file    Physically abused: Not on file    Forced sexual activity: Not on file  Other Topics Concern  . Not on file  Social History Narrative  . Not on file     Allergies  Allergen Reactions  . Codeine Itching  . Sulfa Antibiotics Rash and Other (See Comments)    Flu like symptoms     Outpatient Medications Prior  to Visit  Medication Sig Dispense Refill  . albuterol (PROVENTIL HFA;VENTOLIN HFA) 108 (90 Base) MCG/ACT inhaler Inhale 2 puffs into the lungs every 6 (six) hours as needed for wheezing or shortness of breath. 1 Inhaler 0  . amLODipine (NORVASC) 5 MG tablet Take 1 tablet (5 mg total) by mouth daily. 90 tablet 2  . aspirin-acetaminophen-caffeine (EXCEDRIN MIGRAINE) 250-250-65 MG tablet Take 2 tablets by mouth every 8 (eight) hours as needed for headache.    . esomeprazole (NEXIUM) 40 MG capsule Take 1 capsule (40 mg total) by mouth daily. 30 capsule 3  . FLUoxetine (PROZAC) 20 MG tablet Take 1 tablet (20 mg total) by mouth daily. 90 tablet 1  . fluticasone (FLONASE) 50 MCG/ACT nasal spray Place 1 spray into both nostrils daily. (Patient taking differently: Place 1 spray into both nostrils daily as needed for allergies. ) 16 g 3  . fluticasone-salmeterol (ADVAIR HFA) 115-21 MCG/ACT inhaler Inhale 2 puffs into the lungs 2 (two) times daily. 1 Inhaler 12  . loperamide (IMODIUM) 2 MG capsule Take 2 mg by mouth daily. For IBS    . loratadine (CLARITIN) 10 MG tablet Take 10 mg by mouth daily.    . Mirabegron (MYRBETRIQ PO) Take 25 mg by mouth daily.    . montelukast (SINGULAIR) 10 MG tablet Take 1 tablet (10 mg total) by mouth at bedtime. (Patient taking differently: Take 10 mg by mouth daily. ) 90 tablet 3  . Spacer/Aero-Holding Chambers (AEROCHAMBER MV) inhaler Use as instructed 1 each 0   No facility-administered medications prior to visit.     Review of Systems    Constitutional: Negative for chills, fever, malaise/fatigue and weight loss.  HENT: Negative for hearing loss, sore throat and tinnitus.   Eyes: Negative for blurred vision and double vision.  Respiratory: Positive for shortness of breath. Negative for cough, hemoptysis, sputum production, wheezing and stridor.   Cardiovascular: Negative for chest pain, palpitations, orthopnea, leg swelling and PND.  Gastrointestinal: Negative for abdominal pain, constipation, diarrhea, heartburn, nausea and vomiting.  Genitourinary: Negative for dysuria, hematuria and urgency.  Musculoskeletal: Negative for joint pain and myalgias.  Skin: Negative for itching and rash.  Neurological: Negative for dizziness, tingling, weakness and headaches.  Endo/Heme/Allergies: Negative for environmental allergies. Does not bruise/bleed easily.  Psychiatric/Behavioral: Negative for depression. The patient is not nervous/anxious and does not have insomnia.   All other systems reviewed and are negative.    Objective:  Physical Exam Vitals signs reviewed.  Constitutional:      General: She is not in acute distress.    Appearance: She is well-developed. She is obese.  HENT:     Head: Normocephalic and atraumatic.  Eyes:     General: No scleral icterus.    Conjunctiva/sclera: Conjunctivae normal.     Pupils: Pupils are equal, round, and reactive to light.  Neck:     Musculoskeletal: Neck supple.     Vascular: No JVD.     Trachea: No tracheal deviation.  Cardiovascular:     Rate and Rhythm: Normal rate and regular rhythm.     Heart sounds: Normal heart sounds. No murmur.  Pulmonary:     Effort: Pulmonary effort is normal. No tachypnea, accessory muscle usage or respiratory distress.     Breath sounds: Normal breath sounds. No stridor. No wheezing, rhonchi or rales.  Abdominal:     General: Bowel sounds are normal. There is no distension.     Palpations: Abdomen is soft.     Tenderness:  There is no abdominal  tenderness.  Musculoskeletal:        General: No tenderness.  Lymphadenopathy:     Cervical: No cervical adenopathy.  Skin:    General: Skin is warm and dry.     Capillary Refill: Capillary refill takes less than 2 seconds.     Findings: No rash.  Neurological:     Mental Status: She is alert and oriented to person, place, and time.  Psychiatric:        Behavior: Behavior normal.      Vitals:   12/10/18 1113  BP: 110/80  Pulse: 87  SpO2: 100%  Weight: 188 lb (85.3 kg)  Height: 5' 4.5" (1.638 m)   100% on RA BMI Readings from Last 3 Encounters:  12/10/18 31.77 kg/m  12/09/18 33.98 kg/m  10/28/18 33.66 kg/m   Wt Readings from Last 3 Encounters:  12/10/18 188 lb (85.3 kg)  12/09/18 191 lb 12.8 oz (87 kg)  10/28/18 190 lb (86.2 kg)     CBC    Component Value Date/Time   WBC 5.9 12/09/2018 1504   RBC 4.83 12/09/2018 1504   HGB 13.8 12/09/2018 1504   HCT 41.6 12/09/2018 1504   PLT 297.0 12/09/2018 1504   MCV 86.1 12/09/2018 1504   MCH 28.3 08/08/2018 1318   MCHC 33.3 12/09/2018 1504   RDW 16.1 (H) 12/09/2018 1504   LYMPHSABS 1.7 12/09/2018 1504   MONOABS 0.4 12/09/2018 1504   EOSABS 0.3 12/09/2018 1504   BASOSABS 0.0 12/09/2018 1504    Chest Imaging: 10/06/2018 chest x-ray: Basilar linear opacities, present on previous 3 chest x-rays The patient's images have been independently reviewed by me.    Pulmonary Functions Testing Results: PFT Results Latest Ref Rng & Units 12/10/2018  FVC-Pre L 3.02  FVC-Predicted Pre % 94  FVC-Post L 2.92  FVC-Predicted Post % 91  Pre FEV1/FVC % % 88  Post FEV1/FCV % % 92  FEV1-Pre L 2.67  FEV1-Predicted Pre % 109  FEV1-Post L 2.69  DLCO UNC% % 97  DLCO COR %Predicted % 98  TLC L 4.93  TLC % Predicted % 96  RV % Predicted % 84    FeNO: None   Pathology: None   Echocardiogram:  06/26/2018 echocardiogram: Preserved ejection fraction 60 to 65%, mild MR, grade 1 diastolic dysfunction  Heart Catheterization: None      Assessment & Plan:   DOE (dyspnea on exertion)  Lung nodule  Solitary pulmonary nodule  Seasonal allergies  Hx of eczema  Discussion:  This is a 68 year old female with a history of recurrent upper respiratory contractions, sinus and bronchial symptoms as well as a history of seasonal allergies and eczema.  Currently managed with Advair HFA.  At this point has been doing much better when it comes to these respiratory symptoms but she has noticed a new/persistent dyspnea on exertion.  During this evaluation she has had an echocardiogram which was normal ejection fraction and grade 1 diastolic dysfunction.  She had a CT scan of the chest for evaluation of follow-up for an infiltrate. The CT scan was completed at [facility].  We have the report but no images to review.  She does have a small 5 mm left lower lobe lung nodule.  We will recommend a 26-monthnoncontrasted CT of the chest to be completed for evaluation of the lung nodule to ensure stability. Patient did have pulmonary function test completed prior to today's office visit which were normal.  She had normal spirometry,  normal lung capacity and normal DLCO.  Today in the office we discussed further evaluation for dyspnea on exertion.  This to include MCT testing but she does not have any clinical suspicion for overt asthma as she is not have any episodic wheezing events or persistent bronchial reactivity symptoms.  She does have normal PFTs.  We also discussed the utility of cardiopulmonary exercise testing.  However I do think that it would be more prudent for her to increase her exercise tolerance daily to see if she notices a difference in any improvement before proceeding with this type of testing.  Patient to continue weight management and exercise to improve weight loss.  Patient to follow-up with [place] in 1 year after her CT scan to document stability of this 5 mm lung nodule.  Greater than 50% of this patient's 40-minute of  visit was spent face-to-face cussing the recommendations and treatment plan as well as reviewing CT imaging as pulmonary function tests.  In addition we discussed the risk benefits and alternatives and pros and cons of proceeding with additional diagnostics for evaluation of dyspnea on exertion.   Current Outpatient Medications:  .  albuterol (PROVENTIL HFA;VENTOLIN HFA) 108 (90 Base) MCG/ACT inhaler, Inhale 2 puffs into the lungs every 6 (six) hours as needed for wheezing or shortness of breath., Disp: 1 Inhaler, Rfl: 0 .  amLODipine (NORVASC) 5 MG tablet, Take 1 tablet (5 mg total) by mouth daily., Disp: 90 tablet, Rfl: 2 .  aspirin-acetaminophen-caffeine (EXCEDRIN MIGRAINE) 250-250-65 MG tablet, Take 2 tablets by mouth every 8 (eight) hours as needed for headache., Disp: , Rfl:  .  esomeprazole (NEXIUM) 40 MG capsule, Take 1 capsule (40 mg total) by mouth daily., Disp: 30 capsule, Rfl: 3 .  FLUoxetine (PROZAC) 20 MG tablet, Take 1 tablet (20 mg total) by mouth daily., Disp: 90 tablet, Rfl: 1 .  fluticasone (FLONASE) 50 MCG/ACT nasal spray, Place 1 spray into both nostrils daily. (Patient taking differently: Place 1 spray into both nostrils daily as needed for allergies. ), Disp: 16 g, Rfl: 3 .  fluticasone-salmeterol (ADVAIR HFA) 115-21 MCG/ACT inhaler, Inhale 2 puffs into the lungs 2 (two) times daily., Disp: 1 Inhaler, Rfl: 12 .  loperamide (IMODIUM) 2 MG capsule, Take 2 mg by mouth daily. For IBS, Disp: , Rfl:  .  loratadine (CLARITIN) 10 MG tablet, Take 10 mg by mouth daily., Disp: , Rfl:  .  Mirabegron (MYRBETRIQ PO), Take 25 mg by mouth daily., Disp: , Rfl:  .  montelukast (SINGULAIR) 10 MG tablet, Take 1 tablet (10 mg total) by mouth at bedtime. (Patient taking differently: Take 10 mg by mouth daily. ), Disp: 90 tablet, Rfl: 3 .  Spacer/Aero-Holding Chambers (AEROCHAMBER MV) inhaler, Use as instructed, Disp: 1 each, Rfl: 0   Garner Nash, DO [facility] 12/10/2018  11:29 AM

## 2018-12-11 ENCOUNTER — Telehealth: Payer: Self-pay | Admitting: Family Medicine

## 2018-12-11 NOTE — Telephone Encounter (Signed)
Copied from [place] [id number]. Topic: General - Inquiry >> Dec 11, 2018  3:05 PM Sheran Luz wrote: Reason for CRM: Patient states that she was advised to contact office by pharmacy as they have attempted to contact office x2 today (no documentation?) regarding prior authorization through [employer] for the medication esomeprazole (NEXIUM) 40 MG capsule. Please advise.

## 2018-12-12 NOTE — Telephone Encounter (Signed)
Yancey Flemings Key: [id number] - PA Case ID: [id number] - Rx #: [id number] Need help? Call us at [phone]  Outcome  Approvedtoday  CaseId:[id number];Status:Approved;Review Type:Prior Auth;Coverage Start Date:11/12/2018;Coverage End Date:10/07/2098;  DrugEsomeprazole Magnesium 40MG dr capsules  FormTricare Electronic PA Form  Original Claim Info75 CALL HELP DESK  My chart message sent to let patient know.

## 2018-12-18 ENCOUNTER — Other Ambulatory Visit: Payer: Self-pay

## 2018-12-18 ENCOUNTER — Ambulatory Visit
Admission: RE | Admit: 2018-12-18 | Discharge: 2018-12-18 | Disposition: A | Discharge status: Home / Self Care | Payer: Medicare Other | Source: Ambulatory Visit | Attending: Family Medicine | Admitting: Family Medicine

## 2018-12-18 DIAGNOSIS — K760 Fatty (change of) liver, not elsewhere classified: Secondary | ICD-10-CM

## 2018-12-18 DIAGNOSIS — K7689 Other specified diseases of liver: Secondary | ICD-10-CM | POA: Diagnosis not present

## 2018-12-31 ENCOUNTER — Encounter: Payer: Self-pay | Admitting: Gastroenterology

## 2019-01-09 ENCOUNTER — Other Ambulatory Visit: Payer: Self-pay

## 2019-01-09 ENCOUNTER — Ambulatory Visit (INDEPENDENT_AMBULATORY_CARE_PROVIDER_SITE_OTHER): Payer: Medicare Other | Admitting: Gastroenterology

## 2019-01-09 ENCOUNTER — Encounter: Payer: Self-pay | Admitting: Gastroenterology

## 2019-01-09 VITALS — Ht 64.0 in | Wt 188.0 lb

## 2019-01-09 DIAGNOSIS — R932 Abnormal findings on diagnostic imaging of liver and biliary tract: Secondary | ICD-10-CM

## 2019-01-09 DIAGNOSIS — K219 Gastro-esophageal reflux disease without esophagitis: Secondary | ICD-10-CM

## 2019-01-09 DIAGNOSIS — R1013 Epigastric pain: Secondary | ICD-10-CM | POA: Diagnosis not present

## 2019-01-09 DIAGNOSIS — R748 Abnormal levels of other serum enzymes: Secondary | ICD-10-CM

## 2019-01-09 MED ORDER — ESOMEPRAZOLE MAGNESIUM 40 MG PO CPDR: 40.0000 mg | DELAYED_RELEASE_CAPSULE | Freq: Two times a day (BID) | ORAL | 3 refills | Status: DC

## 2019-01-09 NOTE — Progress Notes (Addendum)
TELEHEALTH VISIT  Referring Provider: Briscoe Deutscher, DO Primary Care Physician:  Briscoe Deutscher, DO   Tele-visit due to COVID-19 pandemic Patient requested visit virtually, consented to the virtual encounter via WebEx Contact made at: 15:00 01/09/19 Patient verified by name and date of birth Location of patient: Car (not driving) Location provider: My medical office Names of persons participating: Me, patient, husband, Marlon Pel CMA Time spent on telehealth visit:   Reason for Consultation:  Acid reflux   IMPRESSION:  Dyspepsia     - H pylori negative 12/09/18    - abdominal ultrasound 12/18/18: echogenic liver, no gallstones, no biliary abnormalities    - labs 12/09/18: normal liver enzymes except for ALT 40 GERD Daily NSAIDs (Excedrin twice daily for headaches) Elevated ALT ranging from 34-106    - platelets normal at 297    - HgbA1C 6.2 Echogenic liver on ultrasound Personal history of colon polyps    -Polyps removed on colonoscopy in [place]    -Tubular adenoma removed by Dr. Lyndel Safe 10/20/2015    -Surveillance endoscopy recommended in 5 years (2022) History of IBS with alternating diarrhea and constipation Hemorrhoids   Dyspepsia +/- GERD incompletely treated with daily PPI therapy and exacerbated by daily NSAID use.   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 Must exclude other chronic causes of hepatocellular inflammation that can mimic fatty liver on ultrasound.    PLAN: - Increase Nexium 40 mg BID (requests this be filled through [facility]) - Avoid NSAIDs as you are able - EGD when Covid19 restrictions are lifted to evaluate for esophagitis, gastritis, peptic ulcer disease - Non-urgent labs: Hepatitis C antibody, hepatitis B surface antigen, hepatitis B core antibody, fasting ferritin, fasting insulin, fasting glucose, iron, ANA, AMA, anti-smooth muscle antibody, IgG, IgM  - Abstain from all alcohol including beer, wine,  liquor, and non-alcoholic beer.  - Work to maintain a health weight through portion control and exercise Maximize control of any hypergycemia and hyperlipidemia - Consider NASH fibrosure +/- elastography for staging after serologic evaluation results are available  I consented the patient today discussing the risks, benefits, and alternatives to endoscopic evaluation. In particular, we discussed the risks that include, but are not limited to, reaction to medication, cardiopulmonary compromise, bleeding requiring blood transfusion, aspiration resulting in pneumonia, perforation requiring surgery, lack of diagnosis, severe illness requiring hospitalization, and even death. We reviewed the risk of missed lesion including polyps or even cancer. The patient acknowledges these risks and asks that we proceed.  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 Juleen China for dyspepsia.  The history is obtained to the patient and review of her electronic health record.  History of recurrent upper respiratoryinfections, sinus and bronchial symptoms as well as a history of seasonal allergies and eczema.  She has a small 5 mm left lower lobe lung nodule with a CT scan recommended in 1 year.  History of dyspepsia with a prior diagnosis of reflux.  She was taking Nexium but stopped because she was worried about potential side effects.  She has frequent dyspepsia and intermittent postprandial vomiting.  Symptoms started in December 2019. Heartburn. Brash. Reflux with spicy foods. Ongoing bloating and belching. Dysphonia and cough. Projective vomiting x 1. Occasional knot in her throat. Under evaluation by pulmonologist.  Started on Nexium two weeks ago and this is providing some relief. Burn has resolved and voice has normalized. Still having significant eructation. No other associated  symptoms. No identified exacerbating or relieving features.   Has a known diagnosis of  IBS with alternating diarrhea and constipation. Diagnosed by gastroenterologist in [place]. Cannot remember their name. She has been using Imodium AD with control of symptoms. Previously had diarrhea daily. She is functioning normally on the Imodium AD. On rare occassions will have severe cramping.   Uses Excedrin for migraines and cluster headaches. Uses once or twice daily, every day. No other NSAIDs.    Recent evaluation of symptoms has included:  H. pylori screen was negative 12/09/2018. Labs from 12/09/2018 show a normal comprehensive metabolic panel except for an ALT of 40.  AST is 27, alk phos 83, total bilirubin 0.4.  Her ALT has been persistently elevated on every check back from September 2018.  Platelets were 297 on 12/09/2018.  On that same day glucose was 114, hemoglobin A1c 6.2. Abdominal ultrasound 12/18/2018 showed mildly increased echogenic liver. No other abnormalities seen.  Colonoscopy with Dr. Lyndel Safe 10/20/2015 showed a small ascending colon polyp and sigmoid diverticulosis.  Surveillance endoscopy recommended in 5 years.  No known family history of colon cancer or polyps. No family history of uterine/endometrial cancer, pancreatic cancer or gastric/stomach cancer.  Past Medical History:  Diagnosis Date  . Abnormal LFTs 10/14/2017  . Allergic rhinitis 10/14/2017  . Anxiety   . Avascular necrosis ([place]) 03/24/2018  . Avascular necrosis of hip, right ([place]) 03/24/2018  . Chronic maxillary sinusitis 10/14/2017  . Essential (primary) hypertension 06/26/2017  . Essential hypertension 06/26/2017   Last Assessment & Plan:  Low sodium diet  Check labs and monitor  Continue meds  . Female cystocele 10/14/2017  . Fibromyalgia 10/14/2017  . GERD (gastroesophageal reflux disease) 10/14/2017  . Headache    cluster HA  . Hepatic steatosis 10/14/2017  . 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   Bilateral right worse than left that is mild in nature X-rays obtained in [place] on 07/12/2017 that are available  through the [facility] PACS system  . History of cluster headache 10/14/2017  . IBS (irritable bowel syndrome) 10/14/2017  . 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 OSA (obstructive sleep apnea) 10/14/2017  . 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 Pneumonia   . Postmenopausal 10/14/2017  . Primary osteoarthritis of both hands 07/10/2017  . Recurrent major depressive disorder, in full remission ([place]) 06/26/2017  . RLS (restless legs syndrome) 10/14/2017   not diagnosed  . Seizures ([place])    2006  x1  . 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 Shallow disc bulging without significant neuroforaminal stenosis.    Past Surgical History:  Procedure Laterality Date  . atopic pregnancy    . BREAST BIOPSY Right    approximately 10 years ago/ benign    . BREAST BIOPSY Right   . CARPAL TUNNEL RELEASE     bil  . EYE SURGERY     as a child eyes went out, Bil cataracts 2019  . JOINT REPLACEMENT     Right total hip Dr. Zollie Beckers  . LAPAROSCOPIC TOTAL HYSTERECTOMY    . NASAL SEPTUM SURGERY    . prolapsed bladder    . RHINOPLASTY    . TOTAL HIP ARTHROPLASTY Right 04/11/2018   Procedure: RIGHT TOTAL HIP ARTHROPLASTY ANTERIOR APPROACH;  Surgeon: Mcarthur Rossetti, MD;  Location: [facility]S;  Service: Orthopedics;  Laterality: Right;  .  tummy tuck    . ulnar nerve relocation     left    Current Outpatient Medications  Medication Sig Dispense Refill  . albuterol (PROVENTIL HFA;VENTOLIN HFA) 108 (90 Base) MCG/ACT inhaler Inhale 2 puffs into the lungs every 6 (six) hours as needed for wheezing or shortness of breath. 1 Inhaler 0  . amLODipine (NORVASC) 5 MG tablet Take 1 tablet (5 mg total) by mouth daily. 90 tablet 2  . aspirin-acetaminophen-caffeine (EXCEDRIN MIGRAINE) 250-250-65 MG tablet Take 2 tablets by mouth every 8  (eight) hours as needed for headache.    . esomeprazole (NEXIUM) 40 MG capsule Take 1 capsule (40 mg total) by mouth daily. 30 capsule 3  . fesoterodine (TOVIAZ) 8 MG TB24 tablet Take 8 mg by mouth daily.    Marland Kitchen FLUoxetine (PROZAC) 20 MG tablet Take 1 tablet (20 mg total) by mouth daily. 90 tablet 1  . fluticasone (FLONASE) 50 MCG/ACT nasal spray Place 1 spray into both nostrils daily. (Patient taking differently: Place 1 spray into both nostrils daily as needed for allergies. ) 16 g 3  . fluticasone-salmeterol (ADVAIR HFA) 115-21 MCG/ACT inhaler Inhale 2 puffs into the lungs 2 (two) times daily. 1 Inhaler 12  . loperamide (IMODIUM) 2 MG capsule Take 2 mg by mouth daily. For IBS    . loratadine (CLARITIN) 10 MG tablet Take 10 mg by mouth daily.    . metFORMIN (GLUCOPHAGE XR) 750 MG 24 hr tablet Take 1 tablet (750 mg total) by mouth daily with breakfast. 90 tablet 1  . Mirabegron (MYRBETRIQ PO) Take 25 mg by mouth daily.    . montelukast (SINGULAIR) 10 MG tablet Take 1 tablet (10 mg total) by mouth at bedtime. (Patient taking differently: Take 10 mg by mouth daily. ) 90 tablet 3  . Spacer/Aero-Holding Chambers (AEROCHAMBER MV) inhaler Use as instructed 1 each 0   No current facility-administered medications for this visit.     Allergies as of 01/09/2019 - Review Complete 01/09/2019  Allergen Reaction Noted  . Codeine Itching 01/10/2016  . Sulfa antibiotics Rash and Other (See Comments) 01/10/2016    Family History  Problem Relation Age of Onset  . Hypertension Mother   . Cancer Father   . Heart disease Father     Social History   Socioeconomic History  . Marital status: Married    Spouse name: Not on file  . Number of children: Not on file  . Years of education: Not on file  . Highest education level: Not on file  Occupational History  . Occupation: Product manager: [employer]  Social Needs  . Financial resource strain: Not on file  . Food insecurity:     Worry: Not on file    Inability: Not on file  . Transportation needs:    Medical: Not on file    Non-medical: Not on file  Tobacco Use  . Smoking status: Never Smoker  . Smokeless tobacco: Never Used  Substance and Sexual Activity  . Alcohol use: No    Frequency: Never  . Drug use: No  . Sexual activity: Yes  Lifestyle  . Physical activity:    Days per week: Not on file    Minutes per session: Not on file  . Stress: Not on file  Relationships  . Social connections:    Talks on phone: Not on file    Gets together: Not on file    Attends religious service: Not on file  Active member of club or organization: Not on file    Attends meetings of clubs or organizations: Not on file    Relationship status: Not on file  . Intimate partner violence:    Fear of current or ex partner: Not on file    Emotionally abused: Not on file    Physically abused: Not on file    Forced sexual activity: Not on file  Other Topics Concern  . Not on file  Social History Narrative  . Not on file    Review of Systems: ALL ROS discussed and all others negative except listed in HPI.  Physical Exam: General: in no acute distress Neuro: Alert and appropriate Psych: Normal affect and normal insight   Ricka Westra L. Tarri Glenn, MD, MPH [facility] 01/09/2019, 2:33 PM

## 2019-01-09 NOTE — Patient Instructions (Addendum)
Increase your Nexium to 40 mg twice daily. A new prescription has been sent to your pharmacy.   Avoid NSAID's (aleve, aspirin, ibuprofen)  Abstain from alcohol including beer, wine, liquor, and nonalcoholic beer.   Work to maintain a healthy weight through portion control and exercise.   We will have you come in for blood work and schedule a Upper Endoscopy after the covid19 restrictions are lifted.

## 2019-01-18 DIAGNOSIS — S0001XA Abrasion of scalp, initial encounter: Secondary | ICD-10-CM | POA: Diagnosis not present

## 2019-01-18 DIAGNOSIS — M47812 Spondylosis without myelopathy or radiculopathy, cervical region: Secondary | ICD-10-CM | POA: Diagnosis not present

## 2019-01-18 DIAGNOSIS — J9811 Atelectasis: Secondary | ICD-10-CM | POA: Diagnosis not present

## 2019-01-18 DIAGNOSIS — S0003XA Contusion of scalp, initial encounter: Secondary | ICD-10-CM | POA: Diagnosis not present

## 2019-01-18 DIAGNOSIS — S299XXA Unspecified injury of thorax, initial encounter: Secondary | ICD-10-CM | POA: Diagnosis not present

## 2019-01-18 DIAGNOSIS — S0093XA Contusion of unspecified part of head, initial encounter: Secondary | ICD-10-CM | POA: Diagnosis not present

## 2019-01-21 ENCOUNTER — Encounter: Payer: Self-pay | Admitting: Family Medicine

## 2019-01-21 ENCOUNTER — Ambulatory Visit (INDEPENDENT_AMBULATORY_CARE_PROVIDER_SITE_OTHER): Payer: Medicare Other | Admitting: Family Medicine

## 2019-01-21 ENCOUNTER — Other Ambulatory Visit: Payer: Self-pay

## 2019-01-21 VITALS — Ht 64.0 in | Wt 188.0 lb

## 2019-01-21 DIAGNOSIS — N8110 Cystocele, unspecified: Secondary | ICD-10-CM | POA: Diagnosis not present

## 2019-01-21 DIAGNOSIS — J320 Chronic maxillary sinusitis: Secondary | ICD-10-CM | POA: Diagnosis not present

## 2019-01-21 DIAGNOSIS — K760 Fatty (change of) liver, not elsewhere classified: Secondary | ICD-10-CM

## 2019-01-21 DIAGNOSIS — N3946 Mixed incontinence: Secondary | ICD-10-CM | POA: Diagnosis not present

## 2019-01-21 DIAGNOSIS — I10 Essential (primary) hypertension: Secondary | ICD-10-CM

## 2019-01-21 DIAGNOSIS — E669 Obesity, unspecified: Secondary | ICD-10-CM | POA: Diagnosis not present

## 2019-01-21 DIAGNOSIS — S0003XS Contusion of scalp, sequela: Secondary | ICD-10-CM | POA: Diagnosis not present

## 2019-01-21 DIAGNOSIS — E88819 Insulin resistance, unspecified: Secondary | ICD-10-CM

## 2019-01-21 DIAGNOSIS — E8881 Metabolic syndrome: Secondary | ICD-10-CM | POA: Diagnosis not present

## 2019-01-21 DIAGNOSIS — W108XXS Fall (on) (from) other stairs and steps, sequela: Secondary | ICD-10-CM

## 2019-01-21 DIAGNOSIS — M79674 Pain in right toe(s): Secondary | ICD-10-CM | POA: Diagnosis not present

## 2019-01-21 DIAGNOSIS — Z7189 Other specified counseling: Secondary | ICD-10-CM

## 2019-01-21 DIAGNOSIS — K2950 Unspecified chronic gastritis without bleeding: Secondary | ICD-10-CM | POA: Diagnosis not present

## 2019-01-21 DIAGNOSIS — F3342 Major depressive disorder, recurrent, in full remission: Secondary | ICD-10-CM

## 2019-01-21 MED ORDER — MONTELUKAST SODIUM 10 MG PO TABS: 10.0000 mg | ORAL_TABLET | Freq: Every day | ORAL | 3 refills | Status: DC

## 2019-01-21 MED ORDER — FLUTICASONE PROPIONATE 50 MCG/ACT NA SUSP: 1.0000 | Freq: Every day | NASAL | 3 refills | Status: DC | PRN

## 2019-01-21 MED ORDER — METFORMIN HCL ER 750 MG PO TB24: 750.0000 mg | ORAL_TABLET | Freq: Every day | ORAL | 1 refills | Status: DC

## 2019-01-21 MED ORDER — AMLODIPINE BESYLATE 5 MG PO TABS: 5.0000 mg | ORAL_TABLET | Freq: Every day | ORAL | 2 refills | Status: DC

## 2019-01-21 MED ORDER — FLUOXETINE HCL 20 MG PO TABS: 20.0000 mg | ORAL_TABLET | Freq: Every day | ORAL | 1 refills | Status: DC

## 2019-01-21 NOTE — Progress Notes (Signed)
Virtual Visit via Video   I connected with Margaret White on 01/21/19 at  8:40 AM EDT by a video enabled telemedicine application and verified that I am speaking with the correct person using two identifiers. Location patient: Home Location provider: [facility], Office Persons participating in the virtual visit: Margaret White, Margaret Deutscher, DO Margaret White, CMA acting as scribe for Dr. Briscoe White.   I discussed the limitations of evaluation and management by telemedicine and the availability of in person appointments. The patient expressed understanding and agreed to proceed.  Subjective:   HPI: Patient did have fall on Sunday down stirs head first. She was evaluated at ED. She had negative CT and x rays. She does have some selling and busing in the face. She is feeling better but does have some soreness. She did not pass out. She was just a little disoriented in the middle of the night and that was reason of the fall. She does have some selling in busing in the right foot second and third toe. She has had some headaches that are off and on but not more than normal. Reviewed all red words with patient and what symptoms she needs to look out for.    She has been seen by Urology, pulmonology, and Gastroenterology. Changes made by those offices were reviewed with patient.    Last visit she was started on Metformin for elevated glucose. She has not had any issues with lower blood sugars. She does not check her blood sugar at home.   Reviewed all precautions and expectations with prevention of Covid-19. She is staying at home. She has had increased stress due to Covid.   ROS: See pertinent positives and negatives per HPI.  Patient Active Problem List   Diagnosis Date Noted  . Chronic cough 12/09/2018  . SOB (shortness of breath) on exertion 05/14/2018  . Hypokalemia 05/14/2018  . Incontinence without sensory awareness 05/08/2018  . Mixed incontinence 05/08/2018  . Ocular  rosacea 05/05/2018  . 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 Statin intolerance 03/25/2018  . 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 Avascular necrosis ([place]) 03/24/2018  . Avascular necrosis of hip, right ([place]) 03/24/2018  . 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 GERD (gastroesophageal reflux disease) 10/14/2017  . Postmenopausal 10/14/2017  . Allergic rhinitis 10/14/2017  . Abnormal LFTs 10/14/2017  . Hepatic steatosis 10/14/2017  . OSA (obstructive sleep apnea) 10/14/2017  . RLS (restless legs syndrome) 10/14/2017  . History of cluster headache 10/14/2017  . IBS (irritable bowel syndrome) 10/14/2017  . Fibromyalgia 10/14/2017  . Chronic maxillary sinusitis 10/14/2017  . Pelvic pain 10/14/2017  . Obesity (BMI 30-39.9) 10/14/2017  . Mixed hyperlipidemia 10/14/2017  . Vitamin B12 deficiency 10/14/2017  . Vitamin D deficiency 10/14/2017  . Female cystocele 10/14/2017  . Primary osteoarthritis of both hands 07/10/2017  . Essential hypertension 06/26/2017  . Recurrent major depressive disorder, in full remission ([place]) 06/26/2017    Social History   Tobacco Use  . Smoking status: Never Smoker  . Smokeless tobacco: Never Used  Substance Use Topics  . Alcohol use: No    Frequency: Never    Current Outpatient Medications:  .  albuterol (PROVENTIL HFA;VENTOLIN HFA) 108 (90 Base) MCG/ACT inhaler, Inhale 2 puffs into the lungs every 6 (six) hours as needed for wheezing or shortness of breath., Disp: 1 Inhaler, Rfl: 0 .  amLODipine (NORVASC)  5 MG tablet, Take 1 tablet (5 mg total) by mouth daily., Disp: 90 tablet, Rfl: 2 .  aspirin-acetaminophen-caffeine (EXCEDRIN MIGRAINE) 250-250-65 MG tablet, Take 2 tablets by mouth every 8 (eight) hours as needed for headache., Disp: , Rfl:  .  esomeprazole (NEXIUM) 40 MG capsule, Take 1 capsule (40 mg total) by mouth 2 (two)  times daily before a meal., Disp: 180 capsule, Rfl: 3 .  fesoterodine (TOVIAZ) 8 MG TB24 tablet, Take 8 mg by mouth daily., Disp: , Rfl:  .  FLUoxetine (PROZAC) 20 MG tablet, Take 1 tablet (20 mg total) by mouth daily., Disp: 90 tablet, Rfl: 1 .  fluticasone (FLONASE) 50 MCG/ACT nasal spray, Place 1 spray into both nostrils daily as needed for allergies., Disp: 16 g, Rfl: 3 .  fluticasone-salmeterol (ADVAIR HFA) 115-21 MCG/ACT inhaler, Inhale 2 puffs into the lungs 2 (two) times daily., Disp: 1 Inhaler, Rfl: 12 .  loperamide (IMODIUM) 2 MG capsule, Take 2 mg by mouth daily. For IBS, Disp: , Rfl:  .  loratadine (CLARITIN) 10 MG tablet, Take 10 mg by mouth daily., Disp: , Rfl:  .  metFORMIN (GLUCOPHAGE XR) 750 MG 24 hr tablet, Take 1 tablet (750 mg total) by mouth daily with breakfast., Disp: 90 tablet, Rfl: 1 .  Mirabegron (MYRBETRIQ PO), Take 25 mg by mouth daily., Disp: , Rfl:  .  montelukast (SINGULAIR) 10 MG tablet, Take 1 tablet (10 mg total) by mouth at bedtime., Disp: 90 tablet, Rfl: 3 .  Spacer/Aero-Holding Chambers (AEROCHAMBER MV) inhaler, Use as instructed, Disp: 1 each, Rfl: 0  Allergies  Allergen Reactions  . Codeine Itching  . Sulfa Antibiotics Rash and Other (See Comments)    Flu like symptoms    Objective:   VITALS: Per patient if applicable, see vitals. GENERAL: Alert, appears well and in no acute distress. HEENT: Atraumatic, conjunctiva clear, no obvious abnormalities on inspection of external nose and ears. NECK: Normal movements of the head and neck. CARDIOPULMONARY: No increased WOB. Speaking in clear sentences. I:E ratio WNL.  MS: Moves all visible extremities without noticeable abnormality. PSYCH: Pleasant and cooperative, well-groomed. Speech normal rate and rhythm. Affect is appropriate. Insight and judgement are appropriate. Attention is focused, linear, and appropriate.  NEURO: CN grossly intact. Oriented as arrived to appointment on time with no prompting. Moves  both UE equally.  SKIN: Resolving bruise right eye, small amount on left.   Assessment and Plan:   [name] was seen today for follow-up.  Diagnoses and all orders for this visit:  Essential hypertension -     amLODipine (NORVASC) 5 MG tablet; Take 1 tablet (5 mg total) by mouth daily.  Recurrent major depressive disorder, in full remission ([place]) -     FLUoxetine (PROZAC) 20 MG tablet; Take 1 tablet (20 mg total) by mouth daily.  Chronic maxillary sinusitis -     fluticasone (FLONASE) 50 MCG/ACT nasal spray; Place 1 spray into both nostrils daily as needed for allergies. -     montelukast (SINGULAIR) 10 MG tablet; Take 1 tablet (10 mg total) by mouth at bedtime.  Mixed incontinence  Hepatic steatosis  Fall (on) (from) other stairs and steps, sequela  Contusion of scalp, sequela Comments: No red flags. No AMS. Reviewed precautions for repeat CT.   Pain of toe of right foot Comments: Second and third toes possibly broken. No deformity. Precautions reviewed.  Obesity (BMI 30-39.9) Comments: Working on [employer].   Female cystocele Comments: Will d/w Urology - possible upcoming  repeat surgery.  Chronic gastritis without bleeding, followed by GI Comments: EGD scheduled for future post COVID. No NSAIDs. Diet changes reviewed. Taking Nexium BID.   Insulin resistance Comments: Tolerating Metformin without side effects. Orders: -     metFORMIN (GLUCOPHAGE XR) 750 MG 24 hr tablet; Take 1 tablet (750 mg total) by mouth daily with breakfast.  Educated About Covid-19 Virus Infection    . Reviewed expectations re: course of current medical issues. . Discussed self-management of symptoms. . Outlined signs and symptoms indicating need for more acute intervention. . Patient verbalized understanding and all questions were answered. Marland Kitchen Health Maintenance issues including appropriate healthy diet, exercise, and smoking avoidance were discussed with patient. . See  orders for this visit as documented in the electronic medical record.  Margaret Deutscher, DO 01/21/2019

## 2019-01-22 ENCOUNTER — Other Ambulatory Visit (INDEPENDENT_AMBULATORY_CARE_PROVIDER_SITE_OTHER): Payer: Medicare Other

## 2019-01-22 DIAGNOSIS — K760 Fatty (change of) liver, not elsewhere classified: Secondary | ICD-10-CM

## 2019-01-22 DIAGNOSIS — I10 Essential (primary) hypertension: Secondary | ICD-10-CM | POA: Diagnosis not present

## 2019-01-22 DIAGNOSIS — R350 Frequency of micturition: Secondary | ICD-10-CM | POA: Diagnosis not present

## 2019-01-22 DIAGNOSIS — N3942 Incontinence without sensory awareness: Secondary | ICD-10-CM | POA: Diagnosis not present

## 2019-01-22 LAB — CBC WITH DIFFERENTIAL/PLATELET
Basophils Absolute: 0.1 10*3/uL (ref 0.0–0.1)
Basophils Relative: 0.9 % (ref 0.0–3.0)
Eosinophils Absolute: 0.2 10*3/uL (ref 0.0–0.7)
Eosinophils Relative: 3.2 % (ref 0.0–5.0)
HCT: 42.9 % (ref 36.0–46.0)
Hemoglobin: 14.2 g/dL (ref 12.0–15.0)
Lymphocytes Relative: 25 % (ref 12.0–46.0)
Lymphs Abs: 1.5 10*3/uL (ref 0.7–4.0)
MCHC: 33.1 g/dL (ref 30.0–36.0)
MCV: 86.7 fl (ref 78.0–100.0)
Monocytes Absolute: 0.6 10*3/uL (ref 0.1–1.0)
Monocytes Relative: 9.9 % (ref 3.0–12.0)
Neutro Abs: 3.8 10*3/uL (ref 1.4–7.7)
Neutrophils Relative %: 61 % (ref 43.0–77.0)
Platelets: 278 10*3/uL (ref 150.0–400.0)
RBC: 4.94 Mil/uL (ref 3.87–5.11)
RDW: 14.7 % (ref 11.5–15.5)
WBC: 6.2 10*3/uL (ref 4.0–10.5)

## 2019-01-22 LAB — BASIC METABOLIC PANEL
BUN: 18 mg/dL (ref 6–23)
CO2: 28 mEq/L (ref 19–32)
Calcium: 9.4 mg/dL (ref 8.4–10.5)
Chloride: 102 mEq/L (ref 96–112)
Creatinine, Ser: 1.03 mg/dL (ref 0.40–1.20)
GFR: 53.55 mL/min — ABNORMAL LOW (ref >60.00)
Glucose, Bld: 121 mg/dL — ABNORMAL HIGH (ref 70–99)
Potassium: 4 mEq/L (ref 3.5–5.1)
Sodium: 140 mEq/L (ref 135–145)

## 2019-01-22 LAB — HEPATIC FUNCTION PANEL
ALT: 40 U/L — ABNORMAL HIGH (ref 0–35)
AST: 28 U/L (ref 0–37)
Albumin: 4.1 g/dL (ref 3.5–5.2)
Alkaline Phosphatase: 73 U/L (ref 39–117)
Bilirubin, Direct: 0.1 mg/dL (ref 0.0–0.3)
Total Bilirubin: 0.5 mg/dL (ref 0.2–1.2)
Total Protein: 6.7 g/dL (ref 6.0–8.3)

## 2019-01-22 LAB — LIPID PANEL
Cholesterol: 218 mg/dL — ABNORMAL HIGH (ref 0–200)
HDL: 55.1 mg/dL (ref >39.00)
NonHDL: 163.05
Total CHOL/HDL Ratio: 4
Triglycerides: 205 mg/dL — ABNORMAL HIGH (ref 0.0–149.0)
VLDL: 41 mg/dL — ABNORMAL HIGH (ref 0.0–40.0)

## 2019-01-22 LAB — LDL CHOLESTEROL, DIRECT: Direct LDL: 125 mg/dL

## 2019-01-22 NOTE — Addendum Note (Signed)
Addended by: Francis Dowse T on: 01/22/2019 10:05 AM   Modules accepted: Orders

## 2019-01-30 ENCOUNTER — Ambulatory Visit: Payer: Medicare Other | Admitting: Gastroenterology

## 2019-02-18 DIAGNOSIS — N3942 Incontinence without sensory awareness: Secondary | ICD-10-CM | POA: Diagnosis not present

## 2019-03-02 ENCOUNTER — Emergency Department (HOSPITAL_COMMUNITY): Payer: Medicare Other

## 2019-03-02 ENCOUNTER — Other Ambulatory Visit: Payer: Self-pay

## 2019-03-02 ENCOUNTER — Encounter (HOSPITAL_COMMUNITY): Payer: Self-pay | Admitting: Emergency Medicine

## 2019-03-02 ENCOUNTER — Emergency Department (HOSPITAL_COMMUNITY)
Admission: EM | Admit: 2019-03-02 | Discharge: 2019-03-02 | Disposition: A | Discharge status: Home / Self Care | Payer: Medicare Other | Attending: Emergency Medicine | Admitting: Emergency Medicine

## 2019-03-02 DIAGNOSIS — I10 Essential (primary) hypertension: Secondary | ICD-10-CM | POA: Diagnosis not present

## 2019-03-02 DIAGNOSIS — Z7984 Long term (current) use of oral hypoglycemic drugs: Secondary | ICD-10-CM | POA: Insufficient documentation

## 2019-03-02 DIAGNOSIS — Z79899 Other long term (current) drug therapy: Secondary | ICD-10-CM | POA: Insufficient documentation

## 2019-03-02 DIAGNOSIS — G43809 Other migraine, not intractable, without status migrainosus: Secondary | ICD-10-CM | POA: Diagnosis not present

## 2019-03-02 DIAGNOSIS — Z96641 Presence of right artificial hip joint: Secondary | ICD-10-CM | POA: Insufficient documentation

## 2019-03-02 DIAGNOSIS — R42 Dizziness and giddiness: Secondary | ICD-10-CM | POA: Diagnosis not present

## 2019-03-02 DIAGNOSIS — R110 Nausea: Secondary | ICD-10-CM | POA: Diagnosis not present

## 2019-03-02 DIAGNOSIS — S060X0A Concussion without loss of consciousness, initial encounter: Secondary | ICD-10-CM | POA: Diagnosis not present

## 2019-03-02 LAB — COMPREHENSIVE METABOLIC PANEL
ALT: 46 U/L — ABNORMAL HIGH (ref 0–44)
AST: 37 U/L (ref 15–41)
Albumin: 4.3 g/dL (ref 3.5–5.0)
Alkaline Phosphatase: 73 U/L (ref 38–126)
Anion gap: 9 (ref 5–15)
BUN: 17 mg/dL (ref 8–23)
CO2: 26 mmol/L (ref 22–32)
Calcium: 9.1 mg/dL (ref 8.9–10.3)
Chloride: 105 mmol/L (ref 98–111)
Creatinine, Ser: 1.01 mg/dL — ABNORMAL HIGH (ref 0.44–1.00)
GFR calc Af Amer: >60 mL/min (ref >60)
GFR calc non Af Amer: 58 mL/min — ABNORMAL LOW (ref >60)
Glucose, Bld: 109 mg/dL — ABNORMAL HIGH (ref 70–99)
Potassium: 3.6 mmol/L (ref 3.5–5.1)
Sodium: 140 mmol/L (ref 135–145)
Total Bilirubin: 0.9 mg/dL (ref 0.3–1.2)
Total Protein: 7.5 g/dL (ref 6.5–8.1)

## 2019-03-02 LAB — CBC
HCT: 43.5 % (ref 36.0–46.0)
Hemoglobin: 14 g/dL (ref 12.0–15.0)
MCH: 28.6 pg (ref 26.0–34.0)
MCHC: 32.2 g/dL (ref 30.0–36.0)
MCV: 89 fL (ref 80.0–100.0)
Platelets: 282 10*3/uL (ref 150–400)
RBC: 4.89 MIL/uL (ref 3.87–5.11)
RDW: 14.6 % (ref 11.5–15.5)
WBC: 5.5 10*3/uL (ref 4.0–10.5)
nRBC: 0 % (ref 0.0–0.2)

## 2019-03-02 MED ORDER — SODIUM CHLORIDE 0.9 % IV BOLUS
1000.0000 mL | Freq: Once | INTRAVENOUS | Status: AC
  Administered 2019-03-02: 1000 mL via INTRAVENOUS

## 2019-03-02 MED ORDER — FENTANYL CITRATE (PF) 100 MCG/2ML IJ SOLN
50.0000 ug | Freq: Once | INTRAMUSCULAR | Status: AC
  Administered 2019-03-02: 50 ug via INTRAVENOUS
  Filled 2019-03-02: qty 2

## 2019-03-02 MED ORDER — PROCHLORPERAZINE EDISYLATE 10 MG/2ML IJ SOLN
10.0000 mg | Freq: Once | INTRAMUSCULAR | Status: AC
  Administered 2019-03-02: 10 mg via INTRAVENOUS
  Filled 2019-03-02: qty 2

## 2019-03-02 NOTE — ED Triage Notes (Signed)
Pt reports that she fell down her basement stairs back at [place] and had concussion. Reports yesterday felt "ill" and her husband checked her BP and was 96/70s. Pt has IBS so fluctuates from constipation to diarrhea. Has nausea and mild headache. 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

## 2019-03-02 NOTE — ED Provider Notes (Signed)
[facility] DEPT Provider Note   CSN: [id number] Arrival date & time: 03/02/19  1201    History   Chief Complaint Chief Complaint  Patient presents with  . Fatigue  . Concussion  . Nausea  . Headache    HPI Margaret White is a 68 y.o. female.     HPI Patient is a 68 year old female presents the emergency department with complaints of nausea and mild headache.  She felt ill over the past 24 hours and was found to have a slightly low blood pressure.  She felt lightheaded when she stood up.  She said decreased oral intake.  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  She did fall and injured her head after falling down her basement stairs around [place] and believes that she could have concussion-like symptoms.  Denies use of anticoagulants.  No vomiting.  No fevers or chills.  No cough or shortness of breath.  No weakness of her arms or legs.  Denies neck pain.  No other complaints at this time.    Past Medical History:  Diagnosis Date  . Abnormal LFTs 10/14/2017  . Allergic rhinitis 10/14/2017  . Anxiety   . Avascular necrosis ([place]) 03/24/2018  . Avascular necrosis of hip, right ([place]) 03/24/2018  . Chronic maxillary sinusitis 10/14/2017  . Essential (primary) hypertension 06/26/2017  . Essential hypertension 06/26/2017   Last Assessment & Plan:  Low sodium diet  Check labs and monitor  Continue meds  . Female cystocele 10/14/2017  . Fibromyalgia 10/14/2017  . GERD (gastroesophageal reflux disease) 10/14/2017  . Headache    cluster HA  . Hepatic steatosis 10/14/2017  . 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 History of cluster headache 10/14/2017  . IBS (irritable bowel syndrome) 10/14/2017  . 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 OSA (obstructive sleep apnea) 10/14/2017  . 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 Pneumonia   . Postmenopausal 10/14/2017  . Primary osteoarthritis of both hands 07/10/2017  . Recurrent major depressive disorder, in full remission ([place]) 06/26/2017  . RLS (restless legs syndrome) 10/14/2017   not diagnosed  . Seizures ([place])    2006  x1  . 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 Shallow disc bulging without significant neuroforaminal stenosis.    Patient Active Problem List   Diagnosis Date Noted  . Chronic cough 12/09/2018  . SOB (shortness of breath) on exertion 05/14/2018  . Hypokalemia 05/14/2018  . Incontinence without sensory awareness 05/08/2018  . Mixed incontinence 05/08/2018  . 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 Statin intolerance 03/25/2018  . 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 Avascular necrosis ([place]) 03/24/2018  . Avascular necrosis of hip, right ([place]) 03/24/2018  . 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 GERD (gastroesophageal reflux disease) 10/14/2017  . Postmenopausal 10/14/2017  . Allergic rhinitis 10/14/2017  . Abnormal LFTs 10/14/2017  . Hepatic steatosis 10/14/2017  . OSA (obstructive sleep apnea) 10/14/2017  . RLS (restless legs syndrome) 10/14/2017  . History of cluster headache 10/14/2017  .  IBS (irritable bowel syndrome) 10/14/2017  . Fibromyalgia 10/14/2017  . Chronic maxillary sinusitis 10/14/2017  . Pelvic pain 10/14/2017  . Obesity (BMI 30-39.9) 10/14/2017  . Mixed hyperlipidemia 10/14/2017  . Vitamin B12 deficiency 10/14/2017  . Vitamin D deficiency 10/14/2017  . Female cystocele 10/14/2017  . Primary osteoarthritis of both hands 07/10/2017  . Essential hypertension  06/26/2017  . Recurrent major depressive disorder, in full remission ([place]) 06/26/2017    Past Surgical History:  Procedure Laterality Date  . atopic pregnancy    . BREAST BIOPSY Right    approximately 10 years ago/ benign    . BREAST BIOPSY Right   . CARPAL TUNNEL RELEASE     bil  . EYE SURGERY     as a child eyes went out, Bil cataracts 2019  . JOINT REPLACEMENT     Right total hip Dr. Zollie Beckers  . LAPAROSCOPIC TOTAL HYSTERECTOMY    . NASAL SEPTUM SURGERY    . prolapsed bladder    . RHINOPLASTY    . TOTAL HIP ARTHROPLASTY Right 04/11/2018   Procedure: RIGHT TOTAL HIP ARTHROPLASTY ANTERIOR APPROACH;  Surgeon: Mcarthur Rossetti, MD;  Location: [facility] ORS;  Service: Orthopedics;  Laterality: Right;  . tummy tuck    . ulnar nerve relocation     left     OB History   No obstetric history on file.      Home Medications    Prior to Admission medications   Medication Sig Start Date End Date Taking? Authorizing Provider  albuterol (PROVENTIL HFA;VENTOLIN HFA) 108 (90 Base) MCG/ACT inhaler Inhale 2 puffs into the lungs every 6 (six) hours as needed for wheezing or shortness of breath. 08/28/18  Yes Vivi Barrack, MD  amLODipine (NORVASC) 5 MG tablet Take 1 tablet (5 mg total) by mouth daily. 01/21/19  Yes Briscoe Deutscher, DO  aspirin-acetaminophen-caffeine (EXCEDRIN MIGRAINE) [phone] MG tablet Take 2 tablets by mouth every 8 (eight) hours as needed for headache.   Yes [provider]  esomeprazole (NEXIUM) 40 MG capsule Take 1 capsule (40 mg total) by mouth 2 (two) times daily before a meal. 01/09/19  Yes Thornton Park, MD  FLUoxetine (PROZAC) 20 MG tablet Take 1 tablet (20 mg total) by mouth daily. 01/21/19  Yes Briscoe Deutscher, DO  fluticasone (FLONASE) 50 MCG/ACT nasal spray Place 1 spray into both nostrils daily as needed for allergies. Patient taking differently: Place 1 spray into both nostrils daily.  01/21/19  Yes Briscoe Deutscher, DO  loperamide (IMODIUM) 2  MG capsule Take 2 mg by mouth daily. For IBS   Yes [provider]  loratadine (CLARITIN) 10 MG tablet Take 10 mg by mouth daily.   Yes [provider]  metFORMIN (GLUCOPHAGE XR) 750 MG 24 hr tablet Take 1 tablet (750 mg total) by mouth daily with breakfast. 01/21/19  Yes Briscoe Deutscher, DO  montelukast (SINGULAIR) 10 MG tablet Take 1 tablet (10 mg total) by mouth at bedtime. 01/21/19  Yes Briscoe Deutscher, DO  Spacer/Aero-Holding Chambers (AEROCHAMBER MV) inhaler Use as instructed 10/28/18  Yes Icard, Octavio Graves, DO  fluticasone-salmeterol (ADVAIR HFA) 115-21 MCG/ACT inhaler Inhale 2 puffs into the lungs 2 (two) times daily. Patient not taking: Reported on 03/02/2019 10/06/18   Inda Coke, PA    Family History Family History  Problem Relation Age of Onset  . Hypertension Mother   . Cancer Father   . Heart disease Father     Social History Social History   Tobacco Use  .  Smoking status: Never Smoker  . Smokeless tobacco: Never Used  Substance Use Topics  . Alcohol use: No    Frequency: Never  . Drug use: No     Allergies   Codeine and Sulfa antibiotics   Review of Systems Review of Systems  All other systems reviewed and are negative.    Physical Exam Updated Vital Signs BP (!) 153/90 (BP Location: Left Arm)   Pulse 72   Temp 98.5 F (36.9 C) (Oral)   Resp 18   SpO2 95%   Physical Exam Vitals signs and nursing note reviewed.  Constitutional:      General: She is not in acute distress.    Appearance: She is well-developed.  HENT:     Head: Normocephalic and atraumatic.  Eyes:     Pupils: Pupils are equal, round, and reactive to light.  Neck:     Musculoskeletal: Normal range of motion.  Cardiovascular:     Rate and Rhythm: Normal rate and regular rhythm.     Heart sounds: Normal heart sounds.  Pulmonary:     Effort: Pulmonary effort is normal.     Breath sounds: Normal breath sounds.  Abdominal:     General: There is no distension.      Palpations: Abdomen is soft.     Tenderness: There is no abdominal tenderness.  Musculoskeletal: Normal range of motion.  Skin:    General: Skin is warm and dry.  Neurological:     Mental Status: She is alert and oriented to person, place, and time.     Comments: 5/5 strength in major muscle groups of  bilateral upper and lower extremities. Speech normal. No facial asymetry.   Psychiatric:        Judgment: Judgment normal.      ED Treatments / Results  Labs (all labs ordered are listed, but only abnormal results are displayed) Labs Reviewed  CBC  COMPREHENSIVE METABOLIC PANEL    EKG None  Radiology Ct Head Wo Contrast  Result Date: 03/02/2019 CLINICAL DATA:  Fall, concussion EXAM: CT HEAD WITHOUT CONTRAST TECHNIQUE: Contiguous axial images were obtained from the base of the skull through the vertex without intravenous contrast. COMPARISON:  01/18/2019 FINDINGS: Brain: No evidence of acute infarction, hemorrhage, hydrocephalus, extra-axial collection or mass lesion/mass effect. Vascular: No hyperdense vessel or unexpected calcification. Skull: Normal. Negative for fracture or focal lesion. Sinuses/Orbits: No acute finding. Other: None. IMPRESSION: No acute intracranial pathology. Electronically Signed   By: Eddie Candle M.D.   On: 03/02/2019 13:36    Procedures Procedures (including critical care time)  Medications Ordered in ED Medications  sodium chloride 0.9 % bolus 1,000 mL (1,000 mLs Intravenous New Bag/Given 03/02/19 1312)  prochlorperazine (COMPAZINE) injection 10 mg (10 mg Intravenous Given 03/02/19 1307)  fentaNYL (SUBLIMAZE) injection 50 mcg (50 mcg Intravenous Given 03/02/19 1307)     Initial Impression / Assessment and Plan / ED Course  I have reviewed the triage vital signs and the nursing notes.  Pertinent labs & imaging results that were available during my care of the patient were reviewed by me and considered in my medical decision making (see chart for  details).        Overall well-appearing.  Hydrated emergency department.  Improvement in her headache.  Suspect atypical migraine.  CT imaging of her head is without acute traumatic abnormality.  Primary care follow-up.  No indication for additional work-up at this time.  Feels better.  Slight dehydration.  Final Clinical Impressions(s) /  ED Diagnoses   Final diagnoses:  Other migraine without status migrainosus, not intractable    ED Discharge Orders    None       Jola Schmidt, MD 03/08/19 1151

## 2019-03-02 NOTE — ED Notes (Signed)
Venora Maples, MD at bedside.

## 2019-03-02 NOTE — ED Notes (Signed)
Patient ambulatory to bathroom. Providing UA sample. No problems with gait noted by this RN.

## 2019-03-02 NOTE — ED Notes (Signed)
Patient transported to CT 

## 2019-03-09 ENCOUNTER — Other Ambulatory Visit: Payer: Self-pay | Admitting: Urology

## 2019-03-17 DIAGNOSIS — R350 Frequency of micturition: Secondary | ICD-10-CM | POA: Diagnosis not present

## 2019-03-19 ENCOUNTER — Ambulatory Visit (AMBULATORY_SURGERY_CENTER): Payer: Self-pay

## 2019-03-19 ENCOUNTER — Other Ambulatory Visit: Payer: Self-pay

## 2019-03-19 ENCOUNTER — Encounter (HOSPITAL_BASED_OUTPATIENT_CLINIC_OR_DEPARTMENT_OTHER): Payer: Self-pay

## 2019-03-19 VITALS — Ht 64.0 in | Wt 174.0 lb

## 2019-03-19 DIAGNOSIS — R1013 Epigastric pain: Secondary | ICD-10-CM

## 2019-03-19 NOTE — Progress Notes (Signed)
Denies allergies to eggs or soy products. Denies complication of anesthesia or sedation. Denies use of weight loss medication. Denies use of O2.   Emmi instructions given for endoscopy.  Pre-Visit was conducted by phone due to Covid 19. Instructions were reviewed and mailed to patients confirmed home address. Patient was encouraged to call if she had any questions or concerns regarding instructions.

## 2019-03-20 ENCOUNTER — Other Ambulatory Visit: Payer: Self-pay

## 2019-03-20 ENCOUNTER — Encounter (HOSPITAL_BASED_OUTPATIENT_CLINIC_OR_DEPARTMENT_OTHER): Payer: Self-pay | Admitting: *Deleted

## 2019-03-20 ENCOUNTER — Other Ambulatory Visit (HOSPITAL_COMMUNITY)
Admission: RE | Admit: 2019-03-20 | Discharge: 2019-03-20 | Disposition: A | Discharge status: Home / Self Care | Payer: Medicare Other | Source: Ambulatory Visit | Attending: Urology | Admitting: Urology

## 2019-03-20 DIAGNOSIS — Z1159 Encounter for screening for other viral diseases: Secondary | ICD-10-CM | POA: Diagnosis not present

## 2019-03-20 DIAGNOSIS — Z01812 Encounter for preprocedural laboratory examination: Secondary | ICD-10-CM | POA: Insufficient documentation

## 2019-03-20 NOTE — Progress Notes (Signed)
Spoke w pt via phone for pre-op interview.  Npo after mn.  Arrive at 0900.  Needs istat.  Current ekg and chest ct in epic and chart. Pt had covid test done today .  Will take prozac, nexium, claritin, norvasc, and do flonase nasal spray am dos w/ sips of water.  Pt asked to bring both inhalers.

## 2019-03-21 LAB — NOVEL CORONAVIRUS, NAA (HOSP ORDER, SEND-OUT TO REF LAB; TAT 18-24 HRS): SARS-CoV-2, NAA: NOT DETECTED

## 2019-03-22 NOTE — H&P (Signed)
I was consulted by the above provider to assess the patient's urinary incontinence worsening over 1 year. She leaks sometimes with coughing and sneezing and may be with bending of her bladder was full. She can have some urge incontinence just as she gets to the toilet. She appears to leak not associated with awareness with small volume dampness most of the time. She thought it was sweat. She wears 1 pad a day that stand. I do not think she has enuresis. She can void and wipe and then dribble afterwards  Sometimes she describes key in the door syndrome.   She describes prolapsed uterus with bladder surgery 30 years ago. She had a hysterectomy than   She describes failing 1 medication and also Myrbetriq   Well-supported bladder neck and a negative cost test after cystoscopy. Small grade 1 cystocele. Milder distal grade 1 rectocele asymptomatic.   the patient has milder mixed incontinence. She leaks without awareness. She wears 1 liner a day that is damp. She voids every 1-2 hours and gets up once or twice a night.   Patient's maximum bladder capacity is 300 mL. Bladder was unstable reaching pressures a 4 cm of water. She had urgency but she is not leak. She did not leak with Valsalva pressure 97 cm water. During voluntary voiding she voided 281 mL with maximum flow of 7 mL/second. Maximum voiding pressure is 14 cm of water. She emptied well. EMG activity increased during the voiding phase. She had a prolonged interrupted pattern. Bladder neck descended 1-2 cm. She did have sensory urgency.   I believe the small volume leakage and urge incontinence is due to bladder overactivity. She likely has mild stress incontinence. She has failed two medications. I will see her back on behavioral medical therapy I am not convinced that the sling would be in her best interest and treatment goals would need to be discussed. It may or may not help the leakage not associated wherein S. having said that a refractory  therapy may not be ideal as well.   Leakage without awareness and urge incontinence better. She thinks the Myrbetriq helps. She might try to go to no pads. She still can leak a lot or dribble after she urinates. I discussed the difficulty in treating this. She will try to do some behavioral techniques.. Overall significant improvement and does support the diagnosis with the response to therapy   patient said she was a bit shy to tell me more about her incontinence last time. Hip surgery went very well. She says she can do to create a maneuvers to empty better and can double void of varying amount. When I see her in 5 weeks all check a residual. She said 6 or 8 times she has gotten out of a chair or 1st thing in the morning and will leak. She can stop and start her stream. She can't really control it. She leaks a small amount with coughing and sneezing.   Re-evaluate on Toviaz 8 mg samples in 5 weeks and check a residual. I will go through her chart but I will talk about a sling. If she can live with her stress incontinence it is not unreasonable to try refractory therapy though the former may be the best option pending review. She would need to know that refractory therapies are an option if her symptoms worsen or persist   Toviaz failed. She may have had less leakage when she went from sitting to standing position. She was still leak  when she stretches in bed. She leaks when she gets up. This is her main symptom. She has had takes a long time to void. She will do positional changes. She still has postvoid dribbling. Her residuals have been good. She denies bedwetting and urge incontinence. She leaks a small amount with coughing sneezing The patient said that she could go 6 weeks before she had a reasonable high volume foot on the floor syndrome in the middle of the night or in the morning. She said she can live with her very minimal incontinence during the day.   Picture was drawn and I discussed watchful  waiting versus physical therapy versus a sling versus urethral injectable. Concerns regarding retention inflow symptoms discussed with sling. She did say she had an operation 30 years ago. I think an stress procedure is the next option and not a refractory away be therapy which I mentioned the 3 today.   The patient understands that she does not have a clear path of overactive bladder therapy versus outlet procedure. She will try oxybutynin think about it. I will see her back in a month. She understands that the foot on the floor syndrome only gets better in 50-60% of cases. Having said that is hard to justify refractory therapy. Botox may compromised the flow. I think InterStim is too invasive for severity of symptoms. Percutaneous tibial nerve stimulation and Botox a reasonable things to consider and talk about next time.   Theoretical retention rate increased with injectable discussed as well   Today  Leakage continues. It really did not work. She wants to go ahead with surgery. She understands there is not an absolute perfect treatment without risks noted. I do not think Botox or percutaneous tibial nerve stimulation often as partial responding treatments are necessarily in her best interest. She understands the pros and cons and risks and will continue with the sling and hopefully it reaches her treatment goal.   She emptied efficiently today. She will stay off medication     ALLERGIES: Codeine - Skin crawling Sulfa - Skin Rash, Flu like symptoms Vancomycin    MEDICATIONS: Hydrochlorothiazide  Lisinopril  Oxybutynin Chloride Er 10 mg tablet, extended release 24 hr 1 tablet PO Daily  Advair Hfa  Amoxicillin  Claritin  Flonase Allergy Relief  Prozac  Singulair     GU PSH: Complex cystometrogram, w/ void pressure and urethral pressure profile studies, any technique - 03/26/2018 Complex Uroflow - 03/26/2018 Cystoscopy - 02/05/2018 Emg surf Electrd - 03/26/2018 Inject For cystogram -  03/26/2018 Intrabd voidng Press - 03/26/2018      PSH Notes: abdominoplasty 2006   NON-GU PSH: Anesth, Bladder Surgery - 1984 Carpal tunnel surgery, Bilateral - 1989 Deviated Septum Surgery - [place] Surgery (Unspecified) - 12/09/2017, 4 surgeries between 1957-1960 - 1960 Hysterectomy Nose Surgery (Unspecified) - 2007 Repair of anal fissure - 2006 Treat Ectopic Pregnancy - 1979    GU PMH: Incontinence w/o Sensation - 02/05/2018 Mixed incontinence - 02/05/2018 Nocturia - 02/05/2018 Urinary Frequency - 02/05/2018    NON-GU PMH: Arthritis Depression Heartburn Hypercholesterolemia Hypertension Liver Disease Sleep Apnea    FAMILY HISTORY: 1 son - Other 3 daughters - Other Arthritis - Runs in Family Blood In Urine - Mother Congestive Heart Failure - Runs in Family father deceased - Other High Blood Pressure - Runs in Family Kidney Failure - Mother Kidney Stones - Mother multiple myeloma - Runs in Family patient's mother is still living - Other Prostate Cancer - Father  SOCIAL HISTORY: Marital Status: Married Current Smoking Status: Patient has never smoked.   Tobacco Use Assessment Completed: Used Tobacco in last 30 days? Has never drank.  Drinks 1 caffeinated drink per day. Patient's occupation Community education officer.    REVIEW OF SYSTEMS:    GU Review Female:   Patient reports stream starts and stops, trouble starting your stream, and have to strain to urinate. Patient denies frequent urination, hard to postpone urination, burning /pain with urination, get up at night to urinate, leakage of urine, and being pregnant.  Gastrointestinal (Upper):   Patient denies nausea, vomiting, and indigestion/ heartburn.  Gastrointestinal (Lower):   Patient denies diarrhea and constipation.  Constitutional:   Patient denies fever, night sweats, weight loss, and fatigue.  Skin:   Patient denies skin rash/ lesion and itching.  Eyes:   Patient denies blurred vision and double vision.  Ears/ Nose/  Throat:   Patient denies sore throat and sinus problems.  Hematologic/Lymphatic:   Patient denies swollen glands and easy bruising.  Cardiovascular:   Patient denies leg swelling and chest pains.  Respiratory:   Patient denies cough and shortness of breath.  Endocrine:   Patient denies excessive thirst.  Musculoskeletal:   Patient denies back pain and joint pain.  Neurological:   Patient denies headaches and dizziness.  Psychologic:   Patient denies depression and anxiety.   VITAL SIGNS:      02/18/2019 01:50 PM  Weight 182.4 lb / 82.74 kg  Height 64 in / 162.56 cm  BP 120/75 mmHg  Pulse 79 /min  Temperature 98.1 F / 36.7 C  BMI 31.3 kg/m   PAST DATA REVIEWED:  Source Of History:  Patient   PROCEDURES:         PVR Ultrasound - 08676  Scanned Volume: 0 cc         Urinalysis w/Scope Dipstick Dipstick Cont'd Micro  Color: Yellow Bilirubin: Neg mg/dL WBC/hpf: 0 - 5/hpf  Appearance: Clear Ketones: Neg mg/dL RBC/hpf: NS (Not Seen)  Specific Gravity: 1.025 Blood: Neg ery/uL Bacteria: NS (Not Seen)  pH: 6.0 Protein: Neg mg/dL Cystals: Ca Oxalate  Glucose: Neg mg/dL Urobilinogen: 0.2 mg/dL Casts: NS (Not Seen)    Nitrites: Neg Trichomonas: Not Present    Leukocyte Esterase: 1+ leu/uL Mucous: Not Present      Epithelial Cells: NS (Not Seen)      Yeast: NS (Not Seen)      Sperm: Not Present    Notes: unspun microscopic performed    ASSESSMENT:      ICD-10 Details  1 GU:   Incontinence w/o Sensation - N39.42   2   Mixed incontinence - N39.46      PLAN:           Schedule Return Visit/Planned Activity: Return PRN - Office Visit   After a thorough review of the management options for the patient's condition the patient  elected to proceed with surgical therapy as noted above. We have discussed the potential benefits and risks of the procedure, side effects of the proposed treatment, the likelihood of the patient achieving the goals of the procedure, and any potential  problems that might occur during the procedure or recuperation. Informed consent has been obtained.

## 2019-03-23 NOTE — Progress Notes (Signed)
SPOKE W/  _ [place] 19:   COUGH--NO  RUNNY NOSE--NO-   SORE THROAT---NO  NASAL CONGESTION----NO  SNEEZING----NO  SHORTNESS OF BREATH---NO  DIFFICULTY BREATHING---NO  TEMP >100.0 -----NO  UNEXPLAINED BODY ACHES------NO  CHILLS -------- NO  HEADACHES ---------NO  LOSS OF SMELL/ TASTE --------NO    HAVE YOU OR ANY FAMILY MEMBER TRAVELLED PAST 14 DAYS OUT OF THE   COUNTY---NO STATE----NO COUNTRY----NO  HAVE YOU OR ANY FAMILY MEMBER BEEN EXPOSED TO ANYONE WITH COVID 19?   NO

## 2019-03-24 ENCOUNTER — Telehealth: Payer: Self-pay | Admitting: Gastroenterology

## 2019-03-24 ENCOUNTER — Encounter (HOSPITAL_BASED_OUTPATIENT_CLINIC_OR_DEPARTMENT_OTHER)
Admission: RE | Disposition: A | Discharge status: Home / Self Care | Payer: Self-pay | Source: Home / Self Care | Attending: Urology

## 2019-03-24 ENCOUNTER — Encounter (HOSPITAL_BASED_OUTPATIENT_CLINIC_OR_DEPARTMENT_OTHER): Payer: Self-pay

## 2019-03-24 ENCOUNTER — Ambulatory Visit (HOSPITAL_BASED_OUTPATIENT_CLINIC_OR_DEPARTMENT_OTHER)
Admission: RE | Admit: 2019-03-24 | Discharge: 2019-03-24 | Disposition: A | Discharge status: Home / Self Care | Payer: Medicare Other | Attending: Urology | Admitting: Urology

## 2019-03-24 ENCOUNTER — Ambulatory Visit (HOSPITAL_BASED_OUTPATIENT_CLINIC_OR_DEPARTMENT_OTHER): Payer: Medicare Other | Admitting: Anesthesiology

## 2019-03-24 DIAGNOSIS — Z881 Allergy status to other antibiotic agents status: Secondary | ICD-10-CM | POA: Insufficient documentation

## 2019-03-24 DIAGNOSIS — Z79899 Other long term (current) drug therapy: Secondary | ICD-10-CM | POA: Diagnosis not present

## 2019-03-24 DIAGNOSIS — F329 Major depressive disorder, single episode, unspecified: Secondary | ICD-10-CM | POA: Insufficient documentation

## 2019-03-24 DIAGNOSIS — Z7951 Long term (current) use of inhaled steroids: Secondary | ICD-10-CM | POA: Insufficient documentation

## 2019-03-24 DIAGNOSIS — Z8249 Family history of ischemic heart disease and other diseases of the circulatory system: Secondary | ICD-10-CM | POA: Diagnosis not present

## 2019-03-24 DIAGNOSIS — K769 Liver disease, unspecified: Secondary | ICD-10-CM | POA: Diagnosis not present

## 2019-03-24 DIAGNOSIS — Z885 Allergy status to narcotic agent status: Secondary | ICD-10-CM | POA: Diagnosis not present

## 2019-03-24 DIAGNOSIS — Z882 Allergy status to sulfonamides status: Secondary | ICD-10-CM | POA: Diagnosis not present

## 2019-03-24 DIAGNOSIS — I10 Essential (primary) hypertension: Secondary | ICD-10-CM | POA: Insufficient documentation

## 2019-03-24 DIAGNOSIS — E119 Type 2 diabetes mellitus without complications: Secondary | ICD-10-CM | POA: Insufficient documentation

## 2019-03-24 DIAGNOSIS — G4730 Sleep apnea, unspecified: Secondary | ICD-10-CM | POA: Diagnosis not present

## 2019-03-24 DIAGNOSIS — N393 Stress incontinence (female) (male): Secondary | ICD-10-CM | POA: Insufficient documentation

## 2019-03-24 DIAGNOSIS — M797 Fibromyalgia: Secondary | ICD-10-CM | POA: Diagnosis not present

## 2019-03-24 HISTORY — DX: Type 2 diabetes mellitus without complications: E11.9

## 2019-03-24 HISTORY — DX: Deficiency of other specified B group vitamins: E53.8

## 2019-03-24 HISTORY — DX: Stress incontinence (female) (male): N39.3

## 2019-03-24 HISTORY — DX: Vitamin D deficiency, unspecified: E55.9

## 2019-03-24 HISTORY — DX: Other seasonal allergic rhinitis: J30.2

## 2019-03-24 HISTORY — PX: PUBOVAGINAL SLING: SHX1035

## 2019-03-24 HISTORY — DX: Personal history of other diseases of the musculoskeletal system and connective tissue: Z87.39

## 2019-03-24 HISTORY — DX: Diaphragmatic hernia without obstruction or gangrene: K44.9

## 2019-03-24 HISTORY — DX: Mixed hyperlipidemia: E78.2

## 2019-03-24 HISTORY — DX: Personal history of colon polyps, unspecified: Z86.0100

## 2019-03-24 HISTORY — DX: Chronic sinusitis, unspecified: J32.9

## 2019-03-24 HISTORY — DX: Solitary pulmonary nodule: R91.1

## 2019-03-24 HISTORY — DX: Dermatitis, unspecified: L30.9

## 2019-03-24 HISTORY — DX: Personal history of colonic polyps: Z86.010

## 2019-03-24 HISTORY — DX: Chronic cluster headache, not intractable: G44.029

## 2019-03-24 HISTORY — DX: Personal history of pneumonia (recurrent): Z87.01

## 2019-03-24 LAB — POCT I-STAT 4, (NA,K, GLUC, HGB,HCT)
Glucose, Bld: 113 mg/dL — ABNORMAL HIGH (ref 70–99)
HCT: 45 % (ref 36.0–46.0)
Hemoglobin: 15.3 g/dL — ABNORMAL HIGH (ref 12.0–15.0)
Potassium: 3.6 mmol/L (ref 3.5–5.1)
Sodium: 141 mmol/L (ref 135–145)

## 2019-03-24 LAB — GLUCOSE, CAPILLARY: Glucose-Capillary: 122 mg/dL — ABNORMAL HIGH (ref 70–99)

## 2019-03-24 SURGERY — CREATION, PUBOVAGINAL SLING
Anesthesia: General | Site: Vagina

## 2019-03-24 MED ORDER — ONDANSETRON HCL 4 MG/2ML IJ SOLN
INTRAMUSCULAR | Status: AC
  Filled 2019-03-24: qty 2

## 2019-03-24 MED ORDER — PHENAZOPYRIDINE HCL 200 MG PO TABS
200.0000 mg | ORAL_TABLET | ORAL | Status: AC
  Administered 2019-03-24: 200 mg via ORAL
  Filled 2019-03-24: qty 1

## 2019-03-24 MED ORDER — CLINDAMYCIN PHOSPHATE 600 MG/50ML IV SOLN
INTRAVENOUS | Status: AC
  Filled 2019-03-24: qty 50

## 2019-03-24 MED ORDER — INDIGOTINDISULFONATE SODIUM 8 MG/ML IJ SOLN
INTRAMUSCULAR | Status: AC
  Filled 2019-03-24: qty 5

## 2019-03-24 MED ORDER — MEPERIDINE HCL 25 MG/ML IJ SOLN
6.2500 mg | INTRAMUSCULAR | Status: DC | PRN
  Filled 2019-03-24: qty 1

## 2019-03-24 MED ORDER — ONDANSETRON HCL 4 MG/2ML IJ SOLN
4.0000 mg | Freq: Once | INTRAMUSCULAR | Status: DC | PRN
  Filled 2019-03-24: qty 2

## 2019-03-24 MED ORDER — FENTANYL CITRATE (PF) 100 MCG/2ML IJ SOLN
INTRAMUSCULAR | Status: AC
  Filled 2019-03-24: qty 2

## 2019-03-24 MED ORDER — FENTANYL CITRATE (PF) 100 MCG/2ML IJ SOLN
25.0000 ug | INTRAMUSCULAR | Status: DC | PRN
  Filled 2019-03-24: qty 1

## 2019-03-24 MED ORDER — CLINDAMYCIN PHOSPHATE 600 MG/50ML IV SOLN
600.0000 mg | INTRAVENOUS | Status: AC
  Administered 2019-03-24: 11:00:00 600 mg via INTRAVENOUS
  Filled 2019-03-24: qty 50

## 2019-03-24 MED ORDER — ESTRADIOL 0.1 MG/GM VA CREA
TOPICAL_CREAM | VAGINAL | Status: DC | PRN
  Administered 2019-03-24: 1 via VAGINAL

## 2019-03-24 MED ORDER — ACETAMINOPHEN 160 MG/5ML PO SOLN
325.0000 mg | ORAL | Status: DC | PRN
  Filled 2019-03-24: qty 20.3

## 2019-03-24 MED ORDER — SUCCINYLCHOLINE CHLORIDE 200 MG/10ML IV SOSY
PREFILLED_SYRINGE | INTRAVENOUS | Status: AC
  Filled 2019-03-24: qty 10

## 2019-03-24 MED ORDER — EPHEDRINE SULFATE-NACL 50-0.9 MG/10ML-% IV SOSY
PREFILLED_SYRINGE | INTRAVENOUS | Status: DC | PRN
  Administered 2019-03-24: 10 mg via INTRAVENOUS

## 2019-03-24 MED ORDER — MIDAZOLAM HCL 2 MG/2ML IJ SOLN
INTRAMUSCULAR | Status: DC | PRN
  Administered 2019-03-24: 2 mg via INTRAVENOUS

## 2019-03-24 MED ORDER — LIDOCAINE 2% (20 MG/ML) 5 ML SYRINGE
INTRAMUSCULAR | Status: AC
  Filled 2019-03-24: qty 5

## 2019-03-24 MED ORDER — ONDANSETRON HCL 4 MG/2ML IJ SOLN
INTRAMUSCULAR | Status: DC | PRN
  Administered 2019-03-24: 4 mg via INTRAVENOUS

## 2019-03-24 MED ORDER — LIDOCAINE 2% (20 MG/ML) 5 ML SYRINGE
INTRAMUSCULAR | Status: DC | PRN
  Administered 2019-03-24: 60 mg via INTRAVENOUS

## 2019-03-24 MED ORDER — LIDOCAINE-EPINEPHRINE (PF) 1 %-1:200000 IJ SOLN
INTRAMUSCULAR | Status: AC
  Filled 2019-03-24: qty 30

## 2019-03-24 MED ORDER — MIDAZOLAM HCL 2 MG/2ML IJ SOLN
INTRAMUSCULAR | Status: AC
  Filled 2019-03-24: qty 2

## 2019-03-24 MED ORDER — DEXAMETHASONE SODIUM PHOSPHATE 10 MG/ML IJ SOLN
INTRAMUSCULAR | Status: DC | PRN
  Administered 2019-03-24: 5 mg via INTRAVENOUS

## 2019-03-24 MED ORDER — ESTRADIOL 0.1 MG/GM VA CREA
TOPICAL_CREAM | VAGINAL | Status: AC
  Filled 2019-03-24: qty 42.5

## 2019-03-24 MED ORDER — EPHEDRINE 5 MG/ML INJ
INTRAVENOUS | Status: AC
  Filled 2019-03-24: qty 10

## 2019-03-24 MED ORDER — FENTANYL CITRATE (PF) 100 MCG/2ML IJ SOLN
INTRAMUSCULAR | Status: DC | PRN
  Administered 2019-03-24: 50 ug via INTRAVENOUS
  Administered 2019-03-24 (×3): 25 ug via INTRAVENOUS

## 2019-03-24 MED ORDER — LIDOCAINE-EPINEPHRINE (PF) 1 %-1:200000 IJ SOLN
INTRAMUSCULAR | Status: DC | PRN
  Administered 2019-03-24: 3 mL

## 2019-03-24 MED ORDER — PROPOFOL 10 MG/ML IV BOLUS
INTRAVENOUS | Status: DC | PRN
  Administered 2019-03-24: 180 mg via INTRAVENOUS

## 2019-03-24 MED ORDER — CIPROFLOXACIN IN D5W 400 MG/200ML IV SOLN
400.0000 mg | INTRAVENOUS | Status: AC
  Administered 2019-03-24: 11:00:00 400 mg via INTRAVENOUS
  Filled 2019-03-24: qty 200

## 2019-03-24 MED ORDER — SODIUM CHLORIDE 0.9 % IR SOLN
Status: DC | PRN
  Administered 2019-03-24: 700 mL via INTRAVESICAL

## 2019-03-24 MED ORDER — SODIUM CHLORIDE 0.9 % IV SOLN
INTRAVENOUS | Status: DC | PRN
  Administered 2019-03-24: 12:00:00 250 mL

## 2019-03-24 MED ORDER — CIPROFLOXACIN IN D5W 400 MG/200ML IV SOLN
INTRAVENOUS | Status: AC
  Filled 2019-03-24: qty 200

## 2019-03-24 MED ORDER — LACTATED RINGERS IV SOLN
INTRAVENOUS | Status: DC
  Administered 2019-03-24 (×2): via INTRAVENOUS
  Filled 2019-03-24: qty 1000

## 2019-03-24 MED ORDER — DEXAMETHASONE SODIUM PHOSPHATE 10 MG/ML IJ SOLN
INTRAMUSCULAR | Status: AC
  Filled 2019-03-24: qty 1

## 2019-03-24 MED ORDER — STERILE WATER FOR IRRIGATION IR SOLN
Status: DC | PRN
  Administered 2019-03-24: 150 mL

## 2019-03-24 MED ORDER — ACETAMINOPHEN 325 MG PO TABS
325.0000 mg | ORAL_TABLET | ORAL | Status: DC | PRN
  Filled 2019-03-24: qty 2

## 2019-03-24 MED ORDER — PHENAZOPYRIDINE HCL 100 MG PO TABS
ORAL_TABLET | ORAL | Status: AC
  Filled 2019-03-24: qty 2

## 2019-03-24 SURGICAL SUPPLY — 53 items
BAG URINE DRAINAGE (UROLOGICAL SUPPLIES) ×1 IMPLANT
BLADE CLIPPER SENSICLIP SURGIC (BLADE) ×2 IMPLANT
BLADE SURG 10 STRL SS (BLADE) ×2 IMPLANT
BLADE SURG 15 STRL LF DISP TIS (BLADE) ×1 IMPLANT
BLADE SURG 15 STRL SS (BLADE) ×1
CANISTER SUCTION 1200CC (MISCELLANEOUS) ×1 IMPLANT
CATH FOLEY 2WAY SLVR  5CC 14FR (CATHETERS) ×1
CATH FOLEY 2WAY SLVR 5CC 14FR (CATHETERS) ×1 IMPLANT
CLEANER CAUTERY TIP 5X5 PAD (MISCELLANEOUS) IMPLANT
COVER BACK TABLE 60X90IN (DRAPES) ×2 IMPLANT
COVER MAYO STAND STRL (DRAPES) ×4 IMPLANT
COVER WAND RF STERILE (DRAPES) ×2 IMPLANT
DERMABOND ADVANCED (GAUZE/BANDAGES/DRESSINGS) ×1
DERMABOND ADVANCED .7 DNX12 (GAUZE/BANDAGES/DRESSINGS) ×1 IMPLANT
DRAPE HYSTEROSCOPY (DRAPE) ×2 IMPLANT
ELECT REM PT RETURN 9FT ADLT (ELECTROSURGICAL) ×2
ELECTRODE REM PT RTRN 9FT ADLT (ELECTROSURGICAL) ×1 IMPLANT
GAUZE SPONGE 4X4 12PLY STRL (GAUZE/BANDAGES/DRESSINGS) ×1 IMPLANT
GLOVE BIO SURGEON STRL SZ 6.5 (GLOVE) ×1 IMPLANT
GLOVE BIO SURGEON STRL SZ7.5 (GLOVE) ×4 IMPLANT
GLOVE BIOGEL PI IND STRL 7.0 (GLOVE) IMPLANT
GLOVE BIOGEL PI INDICATOR 7.0 (GLOVE) ×2
GOWN STRL REUS W/ TWL LRG LVL3 (GOWN DISPOSABLE) ×2 IMPLANT
GOWN STRL REUS W/ TWL XL LVL3 (GOWN DISPOSABLE) ×1 IMPLANT
GOWN STRL REUS W/TWL LRG LVL3 (GOWN DISPOSABLE) ×2
GOWN STRL REUS W/TWL XL LVL3 (GOWN DISPOSABLE) ×1
HOLDER FOLEY CATH W/STRAP (MISCELLANEOUS) ×2 IMPLANT
KIT TURNOVER CYSTO (KITS) ×2 IMPLANT
MANIFOLD NEPTUNE II (INSTRUMENTS) ×1 IMPLANT
NEEDLE HYPO 22GX1.5 SAFETY (NEEDLE) ×2 IMPLANT
PACK BASIN DAY SURGERY FS (CUSTOM PROCEDURE TRAY) ×2 IMPLANT
PACKING VAGINAL (PACKING) ×2 IMPLANT
PAD CLEANER CAUTERY TIP 5X5 (MISCELLANEOUS) ×1
PENCIL BUTTON HOLSTER BLD 10FT (ELECTRODE) ×2 IMPLANT
PLUG CATH AND CAP STER (CATHETERS) ×2 IMPLANT
SET IRRIG Y TYPE TUR BLADDER L (SET/KITS/TRAYS/PACK) ×2 IMPLANT
SHEET LAVH (DRAPES) ×2 IMPLANT
SLING SUPRIS RETROPUBIC KIT (Miscellaneous) ×2 IMPLANT
SURGILUBE 2OZ TUBE FLIPTOP (MISCELLANEOUS) ×2 IMPLANT
SUT VIC AB 2-0 CT1 27 (SUTURE) ×2
SUT VIC AB 2-0 CT1 TAPERPNT 27 (SUTURE) ×2 IMPLANT
SUT VIC AB 2-0 SH 27 (SUTURE) ×1
SUT VIC AB 2-0 SH 27XBRD (SUTURE) IMPLANT
SUT VICRYL 4-0 PS2 18IN ABS (SUTURE) ×2 IMPLANT
SYR 10ML LL (SYRINGE) ×2 IMPLANT
SYR BULB IRRIGATION 50ML (SYRINGE) ×2 IMPLANT
SYR CONTROL 10ML LL (SYRINGE) ×2 IMPLANT
TOWEL OR 17X26 10 PK STRL BLUE (TOWEL DISPOSABLE) ×2 IMPLANT
TRAY DSU PREP LF (CUSTOM PROCEDURE TRAY) ×2 IMPLANT
TUBE CONNECTING 12X1/4 (SUCTIONS) ×4 IMPLANT
WATER STERILE IRR 3000ML UROMA (IV SOLUTION) ×1 IMPLANT
WATER STERILE IRR 500ML POUR (IV SOLUTION) ×1 IMPLANT
YANKAUER SUCT BULB TIP NO VENT (SUCTIONS) ×2 IMPLANT

## 2019-03-24 NOTE — Interval H&P Note (Signed)
History and Physical Interval Note:  03/24/2019 10:42 AM  Margaret White  has presented today for surgery, with the diagnosis of STRESS INCONTINENCE.  The various methods of treatment have been discussed with the patient and family. After consideration of risks, benefits and other options for treatment, the patient has consented to  Procedure(s): CYSTOSCOPY  PUBO-VAGINAL SLING (N/A) as a surgical intervention.  The patient's history has been reviewed, patient examined, no change in status, stable for surgery.  I have reviewed the patient's chart and labs.  Questions were answered to the patient's satisfaction.     Vega Stare A Jia Dottavio

## 2019-03-24 NOTE — Anesthesia Postprocedure Evaluation (Signed)
Anesthesia Post Note  Patient: Margaret White  Procedure(s) Performed: CYSTOSCOPY  PUBO-VAGINAL SLING (N/A Vagina )     Patient location during evaluation: Phase II Anesthesia Type: General Level of consciousness: awake Pain management: pain level controlled Vital Signs Assessment: post-procedure vital signs reviewed and stable Respiratory status: spontaneous breathing Cardiovascular status: stable Postop Assessment: no apparent nausea or vomiting Anesthetic complications: no    Last Vitals:  Vitals:   03/24/19 1315 03/24/19 1330  BP: 124/75   Pulse: 73 71  Resp: 15   Temp:    SpO2: 91% 92%    Last Pain:  Vitals:   03/24/19 1330  TempSrc:   PainSc: 0-No pain   Pain Goal: Patients Stated Pain Goal: 5 (03/24/19 0855)                 Huston Foley

## 2019-03-24 NOTE — Telephone Encounter (Signed)
Covid-19 Screening Questions:    Do you now or have you had a fever in the last 14 days?    NO  Do you have any respiratory symptoms of shortness of breath or cough now or in the last 14 days?   NO  Do you have any family members or close contacts with diagnosed or suspected Covid-19 in the past 14 days?    NO  Have you been tested for Covid-19 and found to be positive?   NO   Pt made aware of that care partner may come to the lobby during the procedure but will need to provide their own mask and was asked to bring one if available.

## 2019-03-24 NOTE — Telephone Encounter (Signed)
Left message to call back to ask Covid-19 screening questions.  Covid-19 Screening Questions:    Do you now or have you had a fever in the last 14 days?      Do you have any respiratory symptoms of shortness of breath or cough now or in the last 14 days?     Do you have any family members or close contacts with diagnosed or suspected Covid-19 in the past 14 days?      Have you been tested for Covid-19 and found to be positive?      Pt made aware of that care partner may come to the lobby during the procedure but will need to provide their own mask and was asked to bring one if available.

## 2019-03-24 NOTE — Discharge Instructions (Signed)
I have reviewed discharge instructions in detail with the patient. They will follow-up with me or their physician as scheduled. My nurse will also be calling the patients as per protocol.   HOME CARE INSTRUCTIONS FOR VAGINAL SLING  Activity:  -No lifting greater than 10-15 pounds for 1 week, or as instructed by your  physician.             -No sexual intercourse until your f/u visit Diet:  You may return to your normal diet tomorrow.   It is important to keep your  bowels regular during the postoperative period.  To avoid constipation, drink plenty of fluids during the day (8-10 glasses) and eat plenty of fresh fruits and vegetables.  Use a mild laxative or stool softener if necessary.  Wound Care:  You may begin showering tomorrow, or as instructed by your physician.         Special Instructions:   Call your physician if any of these symptoms occur:   -temperature greater than 101 degrees Farenheit.   -redness, swelling or drainage at incision site.   -foul odor of your urine.   -a significant decrease in the amount of urine you have every day.   -severe pain not relieved by your pain medicine  Post Anesthesia Home Care Instructions  Activity: Get plenty of rest for the remainder of the day. A responsible individual must stay with you for 24 hours following the procedure.  For the next 24 hours, DO NOT: -Drive a car -Paediatric nurse -Drink alcoholic beverages -Take any medication unless instructed by your physician -Make any legal decisions or sign important papers.  Meals: Start with liquid foods such as gelatin or soup. Progress to regular foods as tolerated. Avoid greasy, spicy, heavy foods. If nausea and/or vomiting occur, drink only clear liquids until the nausea and/or vomiting subsides. Call your physician if vomiting continues.  Special Instructions/Symptoms: Your throat may feel dry or sore from the anesthesia or the breathing tube placed in your throat during  surgery. If this causes discomfort, gargle with warm salt water. The discomfort should disappear within 24 hours.  If you had a scopolamine patch placed behind your ear for the management of post- operative nausea and/or vomiting:  1. The medication in the patch is effective for 72 hours, after which it should be removed.  Wrap patch in a tissue and discard in the trash. Wash hands thoroughly with soap and water. 2. You may remove the patch earlier than 72 hours if you experience unpleasant side effects which may include dry mouth, dizziness or visual disturbances. 3. Avoid touching the patch. Wash your hands with soap and water after contact with the patch.

## 2019-03-24 NOTE — Op Note (Signed)
Preoperative diagnosis: Stress urinary incontinence Postoperative diagnosis: Stress urinary incontinence Surgery: Sling cystourethropexy and cystoscopy Surgeon: Dr. Nicki Reaper Mcdiarmid  The patient was prepped and draped in usual fashion.  Extra care was taken with leg positioning to minimize the risk of compartment syndrome and neuropathy and deep vein thrombosis.  She had a large posterior defect and a high fixed modest cystocele at rest.  She had a very fixed urethra and I needed steep Trendelenburg to visualize it.  I used 2 Allices near the meatus and pickups near the bladder neck to visualize the urethra to mark it with a marking pen appropriately.  I instilled 2-3 cc of lidocaine epinephrine mixture and this gave a little bit better visibility.  I made an appropriate depth incision and appropriate length.  I have mobilized to the urethrovesical angle bilaterally with sharp dissection and spreading maneuver.  I double check urethra and it was not injured  I made two 1 cm incisions 1 fingerbreadth above the symphysis pubis 1.5 cm lateral to the midline.  With the bladder empty I passed a trocar on top of along the back of the symphysis pubis onto the pulp of my index finger using my box technique.   I cystoscoped the patient.  There was no bladder injury.  There is no urethral injury.  There was excellent efflux bilaterally.  It was triple checked.   With the bladder empty I attached the mesh with the described technique and was brought up through the retropubic space.  It was tensioned over the fat part of a moderate size Kelly clamp.  The sling was nice and flat.  It has appropriate hypermobility.  There was no spring back.  There is no question it was loose because of her high risk of retention or any hinge effect from the cystocele noted.  I was very pleased with its tension and its position  I closed the anterior vaginal wall with running 2-0 Vicryl on CT1 needle.  I did 2 interrupted sutures  with an SH needle because of her narrow pubic arch.  The mesh was cut at the abdominal incision level and each incision was closed with interrupted 4-0 Vicryl.  Again in steep Trendelenburg had to be utilized for the careful closure because of her very fixed anatomy almost overcorrected. I would need to lean back at times in my chair  As I was preoperatively the patient has difficult anatomy and arguably higher risk of retention and less efficacy based upon lack of a backboard effect.  She had a narrow pubic arch as well.    Overall is very pleased with the operation hopefully reaches her goal.  Vaginal pack with return clinic cream applied  Blood loss less than 50 mL

## 2019-03-24 NOTE — Anesthesia Procedure Notes (Signed)
Procedure Name: LMA Insertion Date/Time: 03/24/2019 11:06 AM Performed by: Suan Halter, CRNA Pre-anesthesia Checklist: Patient identified, Emergency Drugs available, Suction available and Patient being monitored Patient Re-evaluated:Patient Re-evaluated prior to induction Oxygen Delivery Method: Circle system utilized Preoxygenation: Pre-oxygenation with 100% oxygen Induction Type: IV induction Ventilation: Mask ventilation without difficulty LMA: LMA inserted LMA Size: 4.0 Number of attempts: 1 Airway Equipment and Method: Bite block Placement Confirmation: positive ETCO2 Tube secured with: Tape Dental Injury: Teeth and Oropharynx as per pre-operative assessment

## 2019-03-24 NOTE — Anesthesia Preprocedure Evaluation (Signed)
Anesthesia Evaluation  Patient identified by MRN, date of birth, ID band Patient awake    Reviewed: Allergy & Precautions, NPO status , Patient's Chart, lab work & pertinent test results  Airway Mallampati: I  TM Distance: >3 FB Neck ROM: Full    Dental no notable dental hx. (+) Teeth Intact   Pulmonary sleep apnea ,    Pulmonary exam normal breath sounds clear to auscultation       Cardiovascular hypertension, Pt. on medications Normal cardiovascular exam Rhythm:Regular Rate:Normal     Neuro/Psych PSYCHIATRIC DISORDERS Depression  Neuromuscular disease    GI/Hepatic Neg liver ROS, GERD  Medicated,  Endo/Other  diabetes, Type 2  Renal/GU negative Renal ROS     Musculoskeletal  (+) Arthritis , Fibromyalgia -  Abdominal Normal abdominal exam  (+)   Peds  Hematology negative hematology ROS (+)   Anesthesia Other Findings   Reproductive/Obstetrics                             Lab Results  Component Value Date   WBC 5.5 03/02/2019   HGB 14.0 03/02/2019   HCT 43.5 03/02/2019   MCV 89.0 03/02/2019   PLT 282 03/02/2019   Lab Results  Component Value Date   CREATININE 1.01 (H) 03/02/2019   BUN 17 03/02/2019   NA 140 03/02/2019   K 3.6 03/02/2019   CL 105 03/02/2019   CO2 26 03/02/2019    Anesthesia Physical  Anesthesia Plan  ASA: II  Anesthesia Plan: General   Post-op Pain Management:    Induction: Intravenous  PONV Risk Score and Plan: 4 or greater and Ondansetron, Dexamethasone, Treatment may vary due to age or medical condition and Midazolam  Airway Management Planned: LMA  Additional Equipment:   Intra-op Plan:   Post-operative Plan: Extubation in OR  Informed Consent: I have reviewed the patients History and Physical, chart, labs and discussed the procedure including the risks, benefits and alternatives for the proposed anesthesia with the patient or authorized  representative who has indicated his/her understanding and acceptance.     Dental advisory given  Plan Discussed with: CRNA  Anesthesia Plan Comments:         Anesthesia Quick Evaluation

## 2019-03-24 NOTE — Transfer of Care (Signed)
Immediate Anesthesia Transfer of Care Note  Patient: Margaret White  Procedure(s) Performed: Procedure(s) (LRB): CYSTOSCOPY  PUBO-VAGINAL SLING (N/A)  Patient Location: PACU  Anesthesia Type: General  Level of Consciousness: awake, oriented, sedated and patient cooperative  Airway & Oxygen Therapy: Patient Spontanous Breathing and Patient connected to face mask oxygen  Post-op Assessment: Report given to PACU RN and Post -op Vital signs reviewed and stable  Post vital signs: Reviewed and stable  Complications: No apparent anesthesia complications  Last Vitals:  Vitals Value Taken Time  BP 127/68 03/24/19 1230  Temp    Pulse 75 03/24/19 1233  Resp 12 03/24/19 1233  SpO2 95 % 03/24/19 1233  Vitals shown include unvalidated device data.  Last Pain:  Vitals:   03/24/19 0855  TempSrc: Oral  PainSc: 0-No pain      Patients Stated Pain Goal: 5 (03/24/19 0855)

## 2019-03-25 ENCOUNTER — Ambulatory Visit (AMBULATORY_SURGERY_CENTER): Payer: Medicare Other | Admitting: Gastroenterology

## 2019-03-25 ENCOUNTER — Other Ambulatory Visit: Payer: Self-pay

## 2019-03-25 ENCOUNTER — Encounter (HOSPITAL_BASED_OUTPATIENT_CLINIC_OR_DEPARTMENT_OTHER): Payer: Self-pay | Admitting: Urology

## 2019-03-25 VITALS — BP 145/89 | HR 70 | Temp 98.6°F | Resp 16 | Ht 64.0 in | Wt 188.0 lb

## 2019-03-25 DIAGNOSIS — R1013 Epigastric pain: Secondary | ICD-10-CM

## 2019-03-25 DIAGNOSIS — G2581 Restless legs syndrome: Secondary | ICD-10-CM | POA: Diagnosis not present

## 2019-03-25 DIAGNOSIS — I10 Essential (primary) hypertension: Secondary | ICD-10-CM | POA: Diagnosis not present

## 2019-03-25 DIAGNOSIS — K219 Gastro-esophageal reflux disease without esophagitis: Secondary | ICD-10-CM | POA: Diagnosis not present

## 2019-03-25 DIAGNOSIS — E119 Type 2 diabetes mellitus without complications: Secondary | ICD-10-CM | POA: Diagnosis not present

## 2019-03-25 DIAGNOSIS — K449 Diaphragmatic hernia without obstruction or gangrene: Secondary | ICD-10-CM | POA: Diagnosis not present

## 2019-03-25 DIAGNOSIS — M797 Fibromyalgia: Secondary | ICD-10-CM | POA: Diagnosis not present

## 2019-03-25 DIAGNOSIS — K2980 Duodenitis without bleeding: Secondary | ICD-10-CM | POA: Diagnosis not present

## 2019-03-25 DIAGNOSIS — K3189 Other diseases of stomach and duodenum: Secondary | ICD-10-CM | POA: Diagnosis not present

## 2019-03-25 DIAGNOSIS — G4733 Obstructive sleep apnea (adult) (pediatric): Secondary | ICD-10-CM | POA: Diagnosis not present

## 2019-03-25 MED ORDER — SODIUM CHLORIDE 0.9 % IV SOLN: 500.0000 mL | Freq: Once | INTRAVENOUS | Status: DC

## 2019-03-25 NOTE — Patient Instructions (Signed)
Handouts given for gastritis and Hiatal Hernia.  Avoid all NSAIDS (Aspirin,Ibuprofen, Motrin, Aleve, Naproxen). You may take tylenol if needed.  YOU HAD AN ENDOSCOPIC PROCEDURE TODAY AT [facility]:   Refer to the procedure report that was given to you for any specific questions about what was found during the examination.  If the procedure report does not answer your questions, please call your gastroenterologist to clarify.  If you requested that your care partner not be given the details of your procedure findings, then the procedure report has been included in a sealed envelope for you to review at your convenience later.  YOU SHOULD EXPECT: Some feelings of bloating in the abdomen. Passage of more gas than usual.  Walking can help get rid of the air that was put into your GI tract during the procedure and reduce the bloating. If you had a lower endoscopy (such as a colonoscopy or flexible sigmoidoscopy) you may notice spotting of blood in your stool or on the toilet paper. If you underwent a bowel prep for your procedure, you may not have a normal bowel movement for a few days.  Please Note:  You might notice some irritation and congestion in your nose or some drainage.  This is from the oxygen used during your procedure.  There is no need for concern and it should clear up in a day or so.  SYMPTOMS TO REPORT IMMEDIATELY:   Following upper endoscopy (EGD)  Vomiting of blood or coffee ground material  New chest pain or pain under the shoulder blades  Painful or persistently difficult swallowing  New shortness of breath  Fever of 100F or higher  Black, tarry-looking stools  For urgent or emergent issues, a gastroenterologist can be reached at any hour by calling [phone].   DIET:  We do recommend a small meal at first, but then you may proceed to your regular diet.  Drink plenty of fluids but you should avoid alcoholic beverages for 24 hours.  ACTIVITY:  You  should plan to take it easy for the rest of today and you should NOT DRIVE or use heavy machinery until tomorrow (because of the sedation medicines used during the test).    FOLLOW UP: Our staff will call the number listed on your records 48-72 hours following your procedure to check on you and address any questions or concerns that you may have regarding the information given to you following your procedure. If we do not reach you, we will leave a message.  We will attempt to reach you two times.  During this call, we will ask if you have developed any symptoms of COVID 19. If you develop any symptoms (ie: fever, flu-like symptoms, shortness of breath, cough etc.) before then, please call [phone].  If you test positive for Covid 19 in the 2 weeks post procedure, please call and report this information to [place].    If any biopsies were taken you will be contacted by phone or by letter within the next 1-3 weeks.  Please call us at [phone] if you have not heard about the biopsies in 3 weeks.    SIGNATURES/CONFIDENTIALITY: You and/or your care partner have signed paperwork which will be entered into your electronic medical record.  These signatures attest to the fact that that the information above on your After Visit Summary has been reviewed and is understood.  Full responsibility of the confidentiality of this discharge information lies with you and/or your care-partner.

## 2019-03-25 NOTE — Progress Notes (Signed)
Report to PACU, RN, vss, BBS= Clear.  

## 2019-03-25 NOTE — Op Note (Signed)
[facility] Patient Name: Margaret White Procedure Date: 03/25/2019 2:37 PM MRN: [id number] Endoscopist: Thornton Park MD, MD Age: 68 Referring MD:  Date of Birth: 08-21-1951 Gender: Female Account #: [id number] Procedure:                Upper GI endoscopy Indications:              Dyspepsia                           - H pylori negative 12/09/18                           - abdominal ultrasound 12/18/18: echogenic liver, no                            gallstones, no biliary abnormalities                           - labs 12/09/18: normal liver enzymes except for ALT                            40                           GERD                           Daily NSAIDs (Excedrin twice daily for headaches)                           Elevated ALT ranging from 34-106                           - platelets normal at 297                           - HgbA1C 6.2                           Echogenic liver on ultrasound                           Personal history of colon polyps                           -Polyps removed on colonoscopy in [place]                           -Tubular adenoma removed by Dr. Lyndel Safe 10/20/2015                           -Surveillance endoscopy recommended in 5 years                            (2022)                           History of IBS with alternating diarrhea and  constipation Medicines:                See the Anesthesia note for documentation of the                            administered medications Procedure:                Pre-Anesthesia Assessment:                           - Prior to the procedure, a History and Physical                            was performed, and patient medications and                            allergies were reviewed. The patient's tolerance of                            previous anesthesia was also reviewed. The risks                            and benefits of the procedure and the sedation              options and risks were discussed with the patient.                            All questions were answered, and informed consent                            was obtained. Prior Anticoagulants: The patient has                            taken no previous anticoagulant or antiplatelet                            agents. ASA Grade Assessment: II - A patient with                            mild systemic disease. After reviewing the risks                            and benefits, the patient was deemed in                            satisfactory condition to undergo the procedure.                           After obtaining informed consent, the endoscope was                            passed under direct vision. Throughout the                            procedure, the patient's blood pressure,  pulse, and                            oxygen saturations were monitored continuously. The                            Model GIF-HQ190 [id number]) scope was introduced                            through the mouth, and advanced to the third part                            of duodenum. The upper GI endoscopy was                            accomplished without difficulty. The patient                            tolerated the procedure well. Scope In: Scope Out: Findings:                 The esophagus was normal.                           Diffuse mildly erythematous mucosa without bleeding                            was found in the gastric body. Biopsies were taken                            with a cold forceps for histology. Estimated blood                            loss was minimal.                           A few localized, small non-bleeding erosions were                            found in the gastric antrum. There were no stigmata                            of recent bleeding. Biopsies were taken with a cold                            forceps for histology. Estimated blood loss was                             minimal. A small hiatal hernia was present. Some                            retained food was in the stomach.                           Diffuse mildly erythematous mucosa without active  bleeding and with no stigmata of bleeding was found                            in the duodenal bulb. Biopsies were taken with a                            cold forceps for histology. Complications:            No immediate complications. Estimated blood loss:                            Minimal. Estimated Blood Loss:     Estimated blood loss was minimal. Impression:               - Normal esophagus.                           - Erythematous mucosa in the gastric body. Biopsied.                           - Non-bleeding erosive gastropathy. Biopsied.                           - A small hiatal hernia.                           - Retained food in the stomach.                           - Erythematous duodenopathy. Biopsied. Recommendation:           - Patient has a contact number available for                            emergencies. The signs and symptoms of potential                            delayed complications were discussed with the                            patient. Return to normal activities tomorrow.                            Written discharge instructions were provided to the                            patient.                           - Resume regular diet today.                           - Avoid all NSAIDs.                           - Continue present medications including Nexium 40  mg twice daily.                           - Await pathology results.                           - Follow-up visit with me in 2-4 weeks to review                            these results. Thornton Park MD, MD 03/25/2019 2:59:17 PM This report has been signed electronically.

## 2019-03-27 ENCOUNTER — Telehealth: Payer: Self-pay | Admitting: Family Medicine

## 2019-03-27 ENCOUNTER — Telehealth: Payer: Self-pay

## 2019-03-27 ENCOUNTER — Telehealth: Payer: Self-pay | Admitting: *Deleted

## 2019-03-27 NOTE — Telephone Encounter (Signed)
Pt called in reference to her migraines. Pt was told to stop taking Excedrin by Dr Tarri Glenn and Dr. Marthe Patch? Pt would like to know what else she could take because Excedrin was the only thing that helped per pt. Please advise if pt needs virtual visit to discuss.

## 2019-03-27 NOTE — Telephone Encounter (Signed)
Second call back attempt.  Unable to reach patient.  No answer.

## 2019-03-27 NOTE — Telephone Encounter (Signed)
Yes patient needs visit

## 2019-03-27 NOTE — Telephone Encounter (Signed)
Spoke with the patient, scheduled virtual visit on 04/16/2019 at 3:00 pm. Nothing further at the time of the call.

## 2019-03-27 NOTE — Telephone Encounter (Signed)
First post procedure follow up call, no answer 

## 2019-03-30 ENCOUNTER — Ambulatory Visit (INDEPENDENT_AMBULATORY_CARE_PROVIDER_SITE_OTHER): Payer: Medicare Other | Admitting: Family Medicine

## 2019-03-30 ENCOUNTER — Other Ambulatory Visit: Payer: Self-pay | Admitting: *Deleted

## 2019-03-30 ENCOUNTER — Other Ambulatory Visit: Payer: Self-pay

## 2019-03-30 ENCOUNTER — Encounter: Payer: Self-pay | Admitting: Family Medicine

## 2019-03-30 VITALS — Ht 64.0 in | Wt 172.0 lb

## 2019-03-30 DIAGNOSIS — E88819 Insulin resistance, unspecified: Secondary | ICD-10-CM

## 2019-03-30 DIAGNOSIS — E8881 Metabolic syndrome: Secondary | ICD-10-CM

## 2019-03-30 DIAGNOSIS — K219 Gastro-esophageal reflux disease without esophagitis: Secondary | ICD-10-CM

## 2019-03-30 DIAGNOSIS — R1013 Epigastric pain: Secondary | ICD-10-CM

## 2019-03-30 DIAGNOSIS — G44019 Episodic cluster headache, not intractable: Secondary | ICD-10-CM

## 2019-03-30 DIAGNOSIS — R932 Abnormal findings on diagnostic imaging of liver and biliary tract: Secondary | ICD-10-CM

## 2019-03-30 DIAGNOSIS — G4733 Obstructive sleep apnea (adult) (pediatric): Secondary | ICD-10-CM

## 2019-03-30 MED ORDER — METFORMIN HCL 850 MG PO TABS: 850.0000 mg | ORAL_TABLET | Freq: Two times a day (BID) | ORAL | 3 refills | Status: DC

## 2019-03-30 MED ORDER — TRAMADOL HCL 50 MG PO TABS: 50.0000 mg | ORAL_TABLET | Freq: Every day | ORAL | 0 refills | Status: AC | PRN

## 2019-03-30 NOTE — Progress Notes (Signed)
Virtual Visit via Video   Due to the COVID-19 pandemic, this visit was completed with telemedicine (audio/video) technology to reduce patient and provider exposure as well as to preserve personal protective equipment.   I connected with Margaret White by a video enabled telemedicine application and verified that I am speaking with the correct person using two identifiers. Location patient: Home Location provider: [facility], Office Persons participating in the virtual visit: Margaret White, Margaret Deutscher, DO Margaret White, CMA acting as scribe for Dr. Briscoe White.   I discussed the limitations of evaluation and management by telemedicine and the availability of in person appointments. The patient expressed understanding and agreed to proceed.  Care Team   Patient Care Team: Margaret Deutscher, DO as PCP - General (Family Medicine) Lavonna Monarch, MD as Consulting Physician (Dermatology) Bjorn Loser, MD as Consulting Physician (Urology)  Subjective:   HPI: Patient has history of cluster headaches. She has seen neurology about 30 yes ago for it. 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 In the last month she has had a headache every 2-3 days lasting on average a few hours. When she was taking Excedrin they would go away in 5-10 minutes. She was told to stop taking because of GI issues. She has tried everything over the counter with no improvement.  Her pain level can get to 8/10 behind right eye. If she is able to put pressure on it will help. She has not tried heat but does have some relief with cold compacts.  She does have sensitivity to light but does not have any nausea or vomiting. She has been on Imitrex in the past with no help.   Review of Systems  Constitutional: Negative for chills and fever.  HENT: Negative for hearing loss and tinnitus.   Eyes: Negative for blurred vision and double vision.  Respiratory: Negative for cough and  wheezing.   Cardiovascular: Negative for chest pain, palpitations and leg swelling.  Gastrointestinal: Negative for nausea and vomiting.  Genitourinary: Negative for dysuria, frequency and urgency.  Neurological: Positive for headaches. Negative for dizziness.  Psychiatric/Behavioral: Negative for depression and suicidal ideas.    Patient Active Problem List   Diagnosis Date Noted  . Chronic cough 12/09/2018  . SOB (shortness of breath) on exertion 05/14/2018  . Hypokalemia 05/14/2018  . Incontinence without sensory awareness 05/08/2018  . Mixed incontinence 05/08/2018  . 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 Statin intolerance 03/25/2018  . 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 Avascular necrosis ([place]) 03/24/2018  . Avascular necrosis of hip, right ([place]) 03/24/2018  . 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 GERD (gastroesophageal reflux disease) 10/14/2017  . Postmenopausal 10/14/2017  . Allergic rhinitis 10/14/2017  . Abnormal LFTs 10/14/2017  . Hepatic steatosis 10/14/2017  . OSA (obstructive sleep apnea) 10/14/2017  . RLS (restless legs syndrome) 10/14/2017  . History of cluster headache 10/14/2017  . IBS (irritable bowel syndrome) 10/14/2017  . Fibromyalgia 10/14/2017  . Chronic maxillary sinusitis 10/14/2017  . Pelvic pain 10/14/2017  . Obesity (BMI 30-39.9) 10/14/2017  . Mixed hyperlipidemia 10/14/2017  . Vitamin B12 deficiency 10/14/2017  . Vitamin D deficiency 10/14/2017  . Female cystocele 10/14/2017  . Primary osteoarthritis of both hands 07/10/2017  . Essential hypertension 06/26/2017  . Recurrent major depressive disorder, in full remission ([place]) 06/26/2017  Social History   Tobacco Use  . Smoking status: Never Smoker  . Smokeless tobacco: Never Used  Substance Use Topics  . Alcohol use: No    Frequency:  Never   Current Outpatient Medications:  .  albuterol (PROVENTIL HFA;VENTOLIN HFA) 108 (90 Base) MCG/ACT inhaler, Inhale 2 puffs into the lungs every 6 (six) hours as needed for wheezing or shortness of breath., Disp: 1 Inhaler, Rfl: 0 .  amLODipine (NORVASC) 5 MG tablet, Take 1 tablet (5 mg total) by mouth daily. (Patient taking differently: Take 5 mg by mouth daily. ), Disp: 90 tablet, Rfl: 2 .  esomeprazole (NEXIUM) 40 MG capsule, Take 1 capsule (40 mg total) by mouth 2 (two) times daily before a meal., Disp: 180 capsule, Rfl: 3 .  FLUoxetine (PROZAC) 20 MG tablet, Take 1 tablet (20 mg total) by mouth daily. (Patient taking differently: Take 20 mg by mouth daily. ), Disp: 90 tablet, Rfl: 1 .  fluticasone (FLONASE) 50 MCG/ACT nasal spray, Place 1 spray into both nostrils daily as needed for allergies., Disp: 16 g, Rfl: 3 .  fluticasone-salmeterol (ADVAIR HFA) 115-21 MCG/ACT inhaler, Inhale 2 puffs into the lungs 2 (two) times daily., Disp: 1 Inhaler, Rfl: 12 .  loratadine (CLARITIN) 10 MG tablet, Take 10 mg by mouth daily., Disp: , Rfl:  .  montelukast (SINGULAIR) 10 MG tablet, Take 1 tablet (10 mg total) by mouth at bedtime. (Patient taking differently: Take 10 mg by mouth daily. ), Disp: 90 tablet, Rfl: 3 .  psyllium (METAMUCIL) 58.6 % powder, Take 1 packet by mouth 3 (three) times daily., Disp: , Rfl:  .  metFORMIN (GLUCOPHAGE) 850 MG tablet, Take 1 tablet (850 mg total) by mouth 2 (two) times daily with a meal., Disp: 60 tablet, Rfl: 3  Allergies  Allergen Reactions  . Codeine Itching  . Sulfa Antibiotics Rash and Other (See Comments)    Flu like symptoms   Objective:   VITALS: Per patient if applicable, see vitals. GENERAL: Alert, appears well and in no acute distress. HEENT: Atraumatic, conjunctiva clear, no obvious abnormalities on inspection of external nose and ears. NECK: Normal movements of the head and neck. CARDIOPULMONARY: No increased WOB. Speaking in clear sentences. I:E  ratio WNL.  MS: Moves all visible extremities without noticeable abnormality. PSYCH: Pleasant and cooperative, well-groomed. Speech normal rate and rhythm. Affect is appropriate. Insight and judgement are appropriate. Attention is focused, linear, and appropriate.  NEURO: CN grossly intact. Oriented as arrived to appointment on time with no prompting. Moves both UE equally.  SKIN: No obvious lesions, wounds, erythema, or cyanosis noted on face or hands.  Depression screen [facility] 2/9 03/30/2019 12/09/2018 03/24/2018  Decreased Interest 0 0 3  Down, Depressed, Hopeless 0 0 0  PHQ - 2 Score 0 0 3  Altered sleeping 0 1 1  Tired, decreased energy 0 3 3  Change in appetite 0 2 0  Feeling bad or failure about yourself  0 0 0  Trouble concentrating 0 0 0  Moving slowly or fidgety/restless 0 0 0  Suicidal thoughts 0 0 0  PHQ-9 Score 0 6 7  Difficult doing work/chores Not difficult at all Somewhat difficult Not difficult at all   Assessment and Plan:   Krystina was seen today for migraine.  Diagnoses and all orders for this visit:  Episodic cluster headache, not intractable Comments: Reviewed treatment options. She prefers prn treatment. Open to oxygen via [place], but will have to see if option through DME. Not  candidate for NSAIDs. Okay Tramadol sparingly, with discussion of benefit v risk. May need to have her f/u with Neurology. Consider changing CCB to Verapamil.  Orders: -     traMADol (ULTRAM) 50 MG tablet; Take 1 tablet (50 mg total) by mouth daily as needed for up to 5 days.  OSA (obstructive sleep apnea) Comments: Not on CPAP. Uses oral appliance, except when congested. Possibly contributing to headaches.   Insulin resistance Comments: Changed to IR version due to recall. Orders: -     metFORMIN (GLUCOPHAGE) 850 MG tablet; Take 1 tablet (850 mg total) by mouth 2 (two) times daily with a meal.   . COVID-19 Education: The signs and symptoms of COVID-19 were discussed with the patient and  how to seek care for testing if needed. The importance of social distancing was discussed today. . Reviewed expectations re: course of current medical issues. . Discussed self-management of symptoms. . Outlined signs and symptoms indicating need for more acute intervention. . Patient verbalized understanding and all questions were answered. Marland Kitchen Health Maintenance issues including appropriate healthy diet, exercise, and smoking avoidance were discussed with patient. . See orders for this visit as documented in the electronic medical record.  Margaret Deutscher, DO  Records requested if needed. Time spent: 25 minutes, of which >50% was spent in obtaining information about her symptoms, reviewing her previous labs, evaluations, and treatments, counseling her about her condition (please see the discussed topics above), and developing a plan to further investigate it; she had a number of questions which I addressed.

## 2019-03-30 NOTE — Patient Instructions (Signed)
Homemade gel ice packs These homemade gel ice packs are more comfortable than a bag of frozen peas, because they mold better to your body without the lumps and bumps. They can be made for under $3.  What you need: . 1 quart or 1 gallon plastic freezer bags (depending on how large you want the cold pack)  . 2 cups water  . 1 cup rubbing alcohol Instructions: 1. Fill the plastic freezer bag with 1 cup of rubbing alcohol and 2 cups of water. 2. Try to get as much air out of the freezer bag before sealing it shut. 3. Place the bag and its contents inside a second freezer bag to contain any leakage. 4. Leave the bag in the freezer for at least an hour. 5. When it's ready, place a towel between the gel pack and bare skin to avoid burning the skin. An alternative filler is simply to use dish soap, which has a gel-like consistency and will also freeze/retain the cold.

## 2019-03-31 ENCOUNTER — Other Ambulatory Visit (INDEPENDENT_AMBULATORY_CARE_PROVIDER_SITE_OTHER): Payer: Medicare Other

## 2019-03-31 ENCOUNTER — Encounter: Payer: Self-pay | Admitting: Family Medicine

## 2019-03-31 DIAGNOSIS — K219 Gastro-esophageal reflux disease without esophagitis: Secondary | ICD-10-CM | POA: Diagnosis not present

## 2019-03-31 DIAGNOSIS — R1013 Epigastric pain: Secondary | ICD-10-CM | POA: Diagnosis not present

## 2019-03-31 DIAGNOSIS — R932 Abnormal findings on diagnostic imaging of liver and biliary tract: Secondary | ICD-10-CM

## 2019-03-31 LAB — GLUCOSE, RANDOM: Glucose, Bld: 98 mg/dL (ref 70–99)

## 2019-03-31 LAB — IRON: Iron: 126 ug/dL (ref 42–145)

## 2019-03-31 LAB — FERRITIN: Ferritin: 56.6 ng/mL (ref 10.0–291.0)

## 2019-04-03 LAB — HEPATITIS C ANTIBODY
Hepatitis C Ab: NONREACTIVE
SIGNAL TO CUT-OFF: 0.01 (ref <1.00)

## 2019-04-03 LAB — IGG: IgG (Immunoglobin G), Serum: 917 mg/dL (ref 600–1540)

## 2019-04-03 LAB — INSULIN, FREE AND TOTAL
Free Insulin: 14 uU/mL
Total Insulin: 14 uU/mL

## 2019-04-03 LAB — HEPATITIS B CORE ANTIBODY, TOTAL: Hep B Core Total Ab: NONREACTIVE

## 2019-04-03 LAB — MITOCHONDRIAL ANTIBODIES: Mitochondrial M2 Ab, IgG: <=20 U

## 2019-04-03 LAB — HEPATITIS B SURFACE ANTIGEN: Hepatitis B Surface Ag: NONREACTIVE

## 2019-04-03 LAB — ANA: Anti Nuclear Antibody (ANA): NEGATIVE

## 2019-04-03 LAB — ANTI-SMOOTH MUSCLE ANTIBODY, IGG: Actin (Smooth Muscle) Antibody (IGG): <20 U (ref <20)

## 2019-04-03 LAB — IGM: IgM, Serum: 38 mg/dL — ABNORMAL LOW (ref 50–300)

## 2019-04-16 ENCOUNTER — Encounter: Payer: Self-pay | Admitting: Gastroenterology

## 2019-04-16 ENCOUNTER — Ambulatory Visit (INDEPENDENT_AMBULATORY_CARE_PROVIDER_SITE_OTHER): Payer: Medicare Other | Admitting: Gastroenterology

## 2019-04-16 ENCOUNTER — Other Ambulatory Visit: Payer: Self-pay

## 2019-04-16 VITALS — Ht 64.0 in | Wt 172.0 lb

## 2019-04-16 DIAGNOSIS — R748 Abnormal levels of other serum enzymes: Secondary | ICD-10-CM | POA: Diagnosis not present

## 2019-04-16 DIAGNOSIS — E8881 Metabolic syndrome: Secondary | ICD-10-CM

## 2019-04-16 DIAGNOSIS — K219 Gastro-esophageal reflux disease without esophagitis: Secondary | ICD-10-CM | POA: Diagnosis not present

## 2019-04-16 DIAGNOSIS — R140 Abdominal distension (gaseous): Secondary | ICD-10-CM

## 2019-04-16 DIAGNOSIS — E88819 Insulin resistance, unspecified: Secondary | ICD-10-CM

## 2019-04-16 NOTE — Patient Instructions (Addendum)
Continue to take Nexium 40 mg twice daily for one more month. Then reduce the Nexium to 40 mg daily.  Your labs suggest the possibility of fatty liver disease.  I have recommended additional labs and imaging to determine if there is any liver damage.   Given the suspected fatty liver I recommend the following: - Abstain from all alcohol including beer, wine, liquor, and non-alcoholic beer.  - Work to maintain a health weight through portion control and exercise. -  Maximize control of any high blood sugars and high cholesterol or high lipids  I am also recommended a breath test for bacterial overgrowth. The test works best if you fast for at least 8 and ideally 12 hours before hand. Avoid any fermentable foods the day before the test.  After you complete the test, please start taking IBGard daily. You can find this over the counter at any drug store. I am hoping this may help your bloating.   Let's plan to follow-up in a couple of months to see if things are improving. Please call me before then with any questions or concerns.

## 2019-04-16 NOTE — Progress Notes (Addendum)
TELEHEALTH VISIT  Referring Provider: Briscoe Deutscher, DO Primary Care Physician:  Briscoe Deutscher, DO   Tele-visit due to COVID-19 pandemic Patient requested visit virtually, consented to the virtual encounter via Zoom, then converted to phone due to connection difficulties Contact made at: 15:00 04/16/19  Confirmed full name and date of birth Location of patient: Home Location provider: My medical office Names of persons participating: Me, patient, Tinnie Gens CMA Time spent on telehealth visit: 28  Complaint: Dyspepsia   IMPRESSION:  Dyspepsia +/- GERD    - H pylori negative 12/09/18    - abdominal ultrasound 12/18/18: echogenic liver, no gallstones, no biliary abnormalities    - labs 12/09/18: normal liver enzymes except for ALT 40 NSAID-related gastric erosions on EGD 03/25/19    - biopsies negative for H pylori Bloating/distension and eructation Daily NSAIDs (Excedrin twice daily for headaches) Elevated ALT ranging from 34-106    - platelets normal at 297    - HgbA1C 6.2 Echogenic liver on ultrasound HOMA 3.9 suggesting insulin resistance Personal history of colon polyps    -Polyps removed on colonoscopy in [place]    -Tubular adenoma removed by Dr. Lyndel Safe 10/20/2015    -Surveillance endoscopy recommended in 5 years (2022) History of IBS with alternating diarrhea and constipation    - previously treated with Imodium    - currently using Metamucil Hemorrhoids   Dyspepsia +/- GERD improved on twice daily PPI therapy and avoidance of NSAIDs. Now with primary complaint of bloating/distension.   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 Supported by HOMA IR of 3.9. Serologies were negative for other common causes of hepatocellular injury. Reviewed the natural history and treatment of fatty liver disease. Given her history of diabetes and insulin resistance, she is at risk for fibrosis. Will stage her liver disease with NASH fibrosure and  elastography.    PLAN: - Continue Nexium to complete 8 weeks, with a reduction to 40 mg daily - Avoid NSAIDs as able - Maximize control of any hypergycemia and hyperlipidemia - Proceed with NASH fibrosure +/- elastography for staging after serologic evaluation results are available - I have asked the patient to review her recent labs with Dr. 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  The interval history is obtained through the patient and review of her electronic health record.  History of recurrent upper respiratory infections, sinus and bronchial symptoms as well as a history of seasonal allergies and eczema.  She has a small 5 mm left lower lobe lung nodule with a CT scan recommended in 1 year.   She had an EGD 03/25/19 that revealed reactive gastropathy and gastric erosions. Biopsies were negative for H pylori and celiac.   History of dyspepsia with a prior diagnosis of reflux.  She was taking Nexium but stopped because she was worried about potential side effects.  She has frequent dyspepsia and intermittent postprandial vomiting.  Chest pain, heartburn, dysphonia, cough, "esophageal rawness", brash have improved on twice daily PPI. Continues to have some bloating with associated distension. Frequent eructation.  Feels like she looks pregnant. Not related to eating or defecation.  Has tried Metamucil daily for her known diagnosis of IBS  with alternating diarrhea and constipation instead of Imodium AD.   No long using Excedrin for migraines and cluster headaches. Denies the use of any NSAIDs.    Recent evaluation of symptoms has included:  H. pylori  screen was negative 12/09/2018. Labs from 12/09/2018 show a normal comprehensive metabolic panel except for an ALT of 40.  AST is 27, alk phos 83, total bilirubin 0.4.  Her ALT has been persistently elevated on every check back from September 2018.  Platelets were 297 on 12/09/2018.  On that same day glucose was 114, hemoglobin A1c 6.2. Abdominal ultrasound 12/18/2018 showed mildly increased echogenic liver. No other abnormalities seen. EGD 03/25/19: reactive gastropathy and gastric erosions. Biopsies were negative for H pylori and celiac.   Colonoscopy with Dr. Lyndel Safe 10/20/2015 showed a small ascending colon polyp and sigmoid diverticulosis.  Surveillance endoscopy recommended in 5 years.  Past Medical History:  Diagnosis Date  . Allergic rhinitis   . Anxiety   . Chronic cluster headache   . Chronic sinusitis   . Diabetes mellitus type 2, diet-controlled ([place])    per pt currently no taking metformin, followed by pcp  . Eczema   . Essential hypertension    cardiologsit-- dr dr Bettina Gavia  . Fibromyalgia   . GERD (gastroesophageal reflux disease)   . Hepatic steatosis 10/14/2017  . Hiatal hernia   . 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 History of colon polyps   . History of hyperthyroidism 2005  . History of pneumonia, recurrent   . History of seizure 2006   x1  . IBS (irritable bowel syndrome)   . Mixed hyperlipidemia   . Nodule of lower lobe of left lung 10/2018   67m;   pulmologist-- dr iMeda Coffee bLeory Plowman stable per lov note in epic  . Ocular rosacea   . OSA (obstructive sleep apnea)    per pt intolerant to cpap due to sinus issues,  uses mouth guard   . [place]itis [place]is [place] 11/05/2017   [facility]   .  Primary osteoarthritis of both hands 07/10/2017  . Recurrent major [place] disorder[place] full [place] 06/26/2017  . RLS (restless legs syndrome)   . Seasonal allergies   . Solitary pulmonary nodule    pulmologist-- dr i. bLeory Plowman, lov note in epic,  stable  . 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 Shallow disc bulging without significant neuroforaminal stenosis.  .Marland KitchenSUI (stress urinary incontinence, female)   . Vitamin B 12 deficiency   . Vitamin D deficiency     Past Surgical History:  Procedure Laterality Date  . ABDOMINOPLASTY  2006  . ANAL FISSURE REPAIR  2006  . BLADDER SURGERY  1984  . BREAST BIOPSY Right     benign    . CARPAL TUNNEL RELEASE Bilateral 1989  . CATARACT EXTRACTION W/ INTRAOCULAR LENS  IMPLANT, BILATERAL  2017  . COLONOSCOPY W/ POLYPECTOMY  2017  . [place] . EYE SURGERY  child    x4  1957-1960  . INCONTINENCE SURGERY    . NASAL SEPTUM SURGERY  1987  .  PUBOVAGINAL SLING N/A 03/24/2019   Procedure: CYSTOSCOPY  Gaynelle Arabian;  Surgeon: Bjorn Loser, MD;  Location: [facility];  Service: Urology;  Laterality: N/A;  . RHINOPLASTY  2007  . TOTAL ABDOMINAL HYSTERECTOMY W/ BILATERAL SALPINGOOPHORECTOMY  1984   w/ Marshall-Marchetti (bladder tack suspension)  . TOTAL HIP ARTHROPLASTY Right 04/11/2018   Procedure: RIGHT TOTAL HIP ARTHROPLASTY ANTERIOR APPROACH;  Surgeon: Mcarthur Rossetti, MD;  Location: [facility];  Service: Orthopedics;  Laterality: Right;  . ULNAR NERVE TRANSPOSITION Left 1990s    Current Outpatient Medications  Medication Sig Dispense Refill  . albuterol (PROVENTIL HFA;VENTOLIN HFA) 108 (90 Base) MCG/ACT inhaler Inhale 2 puffs into the lungs every 6 (six) hours as needed for wheezing or shortness of breath. 1 Inhaler 0  . amLODipine (NORVASC) 5 MG tablet Take 1 tablet (5 mg  total) by mouth daily. (Patient taking differently: Take 5 mg by mouth daily. ) 90 tablet 2  . esomeprazole (NEXIUM) 40 MG capsule Take 1 capsule (40 mg total) by mouth 2 (two) times daily before a meal. 180 capsule 3  . FLUoxetine (PROZAC) 20 MG tablet Take 1 tablet (20 mg total) by mouth daily. (Patient taking differently: Take 20 mg by mouth daily. ) 90 tablet 1  . fluticasone (FLONASE) 50 MCG/ACT nasal spray Place 1 spray into both nostrils daily as needed for allergies. 16 g 3  . fluticasone-salmeterol (ADVAIR HFA) 115-21 MCG/ACT inhaler Inhale 2 puffs into the lungs 2 (two) times daily. 1 Inhaler 12  . loratadine (CLARITIN) 10 MG tablet Take 10 mg by mouth daily.    . metFORMIN (GLUCOPHAGE) 850 MG tablet Take 1 tablet (850 mg total) by mouth 2 (two) times daily with a meal. 60 tablet 3  . montelukast (SINGULAIR) 10 MG tablet Take 1 tablet (10 mg total) by mouth at bedtime. (Patient taking differently: Take 10 mg by mouth daily. ) 90 tablet 3  . psyllium (METAMUCIL) 58.6 % powder Take 1 packet by mouth 3 (three) times daily.     No current facility-administered medications for this visit.     Allergies as of 04/16/2019 - Review Complete 04/16/2019  Allergen Reaction Noted  . Codeine Itching 01/10/2016  . Sulfa antibiotics Rash and Other (See Comments) 01/10/2016    Family History  Problem Relation Age of Onset  . Hypertension Mother   . Cancer Father   . Heart disease Father   . Colon cancer Neg Hx   . Esophageal cancer Neg Hx   . Rectal cancer Neg Hx   . Stomach cancer Neg Hx     Social History   Socioeconomic History  . Marital status: Married    Spouse name: Not on file  . Number of children: Not on file  . Years of education: Not on file  . Highest education level: Not on file  Occupational History  . Occupation: Product manager: [employer]  Social Needs  . Financial resource strain: Not on file  . Food insecurity    Worry: Not on file     Inability: Not on file  . Transportation needs    Medical: Not on file    Non-medical: Not on file  Tobacco Use  . Smoking status: Never Smoker  . Smokeless tobacco: Never Used  Substance and Sexual Activity  . Alcohol use: No    Frequency: Never  . Drug use: Never  . Sexual activity: Yes    Birth control/protection: Surgical  Lifestyle  .  Physical activity    Days per week: Not on file    Minutes per session: Not on file  . Stress: Not on file  Relationships  . Social Herbalist on phone: Not on file    Gets together: Not on file    Attends religious service: Not on file    Active member of club or organization: Not on file    Attends meetings of clubs or organizations: Not on file    Relationship status: Not on file  . Intimate partner violence    Fear of current or ex partner: Not on file    Emotionally abused: Not on file    Physically abused: Not on file    Forced sexual activity: Not on file  Other Topics Concern  . Not on file  Social History Narrative  . Not on file    Review of Systems: ALL ROS discussed and all others negative except listed in HPI.  Physical Exam: General: in no acute distress Neuro: Alert and appropriate Psych: Normal affect and normal insight   Twilia Yaklin L. Tarri Glenn, MD, MPH [facility] 04/16/2019, 2:59 PM

## 2019-04-17 ENCOUNTER — Other Ambulatory Visit: Payer: Medicare Other

## 2019-04-17 DIAGNOSIS — R748 Abnormal levels of other serum enzymes: Secondary | ICD-10-CM | POA: Diagnosis not present

## 2019-04-17 DIAGNOSIS — R140 Abdominal distension (gaseous): Secondary | ICD-10-CM | POA: Diagnosis not present

## 2019-04-21 ENCOUNTER — Encounter: Payer: Self-pay | Admitting: *Deleted

## 2019-04-21 LAB — NASH FIBROSURE
ALPHA 2-MACROGLOBULINS, QN: 214 mg/dL (ref 110–276)
ALT (SGPT) P5P: 58 IU/L — ABNORMAL HIGH (ref 0–40)
AST (SGOT) P5P: 41 IU/L — ABNORMAL HIGH (ref 0–40)
Apolipoprotein A-1: 164 mg/dL (ref 116–209)
Bilirubin, Total: <0.1 mg/dL (ref 0.0–1.2)
Cholesterol, Total: 253 mg/dL — ABNORMAL HIGH (ref 100–199)
Fibrosis Score: 0.1 (ref 0.00–0.21)
GGT: 46 IU/L (ref 0–60)
Glucose: 118 mg/dL — ABNORMAL HIGH (ref 65–99)
Haptoglobin: 162 mg/dL (ref 37–355)
Height: 64 in
NASH Score: 0.75 — ABNORMAL HIGH
Steatosis Score: 0.81 — ABNORMAL HIGH (ref 0.00–0.30)
Triglycerides: 260 mg/dL — ABNORMAL HIGH (ref 0–149)
Weight: 172 [lb_av]

## 2019-04-24 ENCOUNTER — Encounter: Payer: Self-pay | Admitting: Gastroenterology

## 2019-04-27 NOTE — Progress Notes (Signed)
Margaret White is a 68 y.o. female is here for follow up.  History of Present Illness:   HPI: Patient would like to talk about pain and chills in her legs and feet.  This happens at night.  It is associated with the feeling of having to move her legs throughout the night.  Patient actually talks her pajama bottoms into her socks each night because of this issue.  It has been going on for a few years.  She does have a history of OSA, spondylosis of the lumbar region, insulin resistance, and fibromyalgia.  She has done nothing for treatment.  Now followed by gastroenterology for her liver.  A1c today was 6.0.  We previously discussed starting metformin and this was on her medication list but apparently did not transfer to her pharmacy.  She has not been on metformin.  We discussed starting at a low dose to avoid any GI side effects.  Husband is struggling with memory loss and the patient is concerned that he is not getting the best care available to him.  We reviewed specialist options together and I offered caregiver support.  Health Maintenance Due  Topic Date Due  . PNA vac Low Risk Adult (2 of 2 - PPSV23) 03/25/2019   Depression screen [facility] 2/9 04/28/2019 03/30/2019 12/09/2018  Decreased Interest 0 0 0  Down, Depressed, Hopeless 0 0 0  PHQ - 2 Score 0 0 0  Altered sleeping 2 0 1  Tired, decreased energy 1 0 3  Change in appetite 1 0 2  Feeling bad or failure about yourself  0 0 0  Trouble concentrating 0 0 0  Moving slowly or fidgety/restless 0 0 0  Suicidal thoughts 0 0 0  PHQ-9 Score 4 0 6  Difficult doing work/chores Not difficult at all Not difficult at all Somewhat difficult   PMHx, SurgHx, SocialHx, FamHx, Medications, and Allergies were reviewed in the Visit Navigator and updated as appropriate.   Patient Active Problem List   Diagnosis Date Noted  . Chronic cough 12/09/2018  . SOB (shortness of breath) on exertion 05/14/2018  . Hypokalemia 05/14/2018  . Incontinence  without sensory awareness 05/08/2018  . Mixed incontinence 05/08/2018  . 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 Statin intolerance 03/25/2018  . 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 Avascular necrosis ([place]) 03/24/2018  . Avascular necrosis of hip, right ([place]) 03/24/2018  . 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 GERD (gastroesophageal reflux disease) 10/14/2017  . Postmenopausal 10/14/2017  . Allergic rhinitis 10/14/2017  . Abnormal LFTs 10/14/2017  . Hepatic steatosis 10/14/2017  . OSA (obstructive sleep apnea) 10/14/2017  . RLS (restless legs syndrome) 10/14/2017  . History of cluster headache 10/14/2017  . IBS (irritable bowel syndrome) 10/14/2017  . Fibromyalgia 10/14/2017  . Chronic maxillary sinusitis 10/14/2017  . Pelvic pain 10/14/2017  . Obesity (BMI 30-39.9) 10/14/2017  . Mixed hyperlipidemia 10/14/2017  . Vitamin B12 deficiency 10/14/2017  . Vitamin D deficiency 10/14/2017  . Female cystocele 10/14/2017  . Primary osteoarthritis of both hands 07/10/2017  . Essential hypertension 06/26/2017  . Recurrent major depressive disorder, in full remission ([place]) 06/26/2017   Social History   Tobacco Use  . Smoking status: Never Smoker  . Smokeless tobacco: Never Used  Substance Use Topics  . Alcohol use: No    Frequency: Never  . Drug use: Never  Current Medications and Allergies   .  albuterol (PROVENTIL HFA;VENTOLIN HFA) 108 (90 Base) MCG/ACT inhaler, Inhale 2 puffs into the lungs every 6 (six) hours as needed for wheezing or shortness of breath., Disp: 1 Inhaler, Rfl: 0 .  amLODipine (NORVASC) 5 MG tablet, Take 1 tablet (5 mg total) by mouth daily. (Patient taking differently: Take 5 mg by mouth daily. ), Disp: 90 tablet, Rfl: 2 .  esomeprazole (NEXIUM) 40 MG capsule, Take 1 capsule (40 mg total) by mouth 2 (two) times  daily before a meal., Disp: 180 capsule, Rfl: 3 .  FLUoxetine (PROZAC) 20 MG tablet, Take 1 tablet (20 mg total) by mouth daily. (Patient taking differently: Take 20 mg by mouth daily. ), Disp: 90 tablet, Rfl: 1 .  fluticasone (FLONASE) 50 MCG/ACT nasal spray, Place 1 spray into both nostrils daily as needed for allergies., Disp: 16 g, Rfl: 3 .  fluticasone-salmeterol (ADVAIR HFA) 115-21 MCG/ACT inhaler, Inhale 2 puffs into the lungs 2 (two) times daily., Disp: 1 Inhaler, Rfl: 12 .  loratadine (CLARITIN) 10 MG tablet, Take 10 mg by mouth daily., Disp: , Rfl:  .  metFORMIN (GLUCOPHAGE) 850 MG tablet, Take 1 tablet (850 mg total) by mouth 2 (two) times daily with a meal., Disp: 60 tablet, Rfl: 3 .  montelukast (SINGULAIR) 10 MG tablet, Take 1 tablet (10 mg total) by mouth at bedtime. (Patient taking differently: Take 10 mg by mouth daily. ), Disp: 90 tablet, Rfl: 3 .  psyllium (METAMUCIL) 58.6 % powder, Take 1 packet by mouth 3 (three) times daily., Disp: , Rfl:    Allergies  Allergen Reactions  . Codeine Itching  . Sulfa Antibiotics Rash and Other (See Comments)    Flu like symptoms   Review of Systems   Pertinent items are noted in the HPI. Otherwise, a complete ROS is negative.  Vitals   Vitals:   04/28/19 0751  BP: 120/85  Pulse: 73  Temp: 98.4 F (36.9 C)  TempSrc: Oral  SpO2: 96%  Weight: 173 lb (78.5 kg)  Height: 5' 4"  (1.626 m)     Body mass index is 29.7 kg/m.  Physical Exam   Physical Exam Vitals signs and nursing note reviewed.  HENT:     Head: Normocephalic and atraumatic.  Eyes:     Pupils: Pupils are equal, round, and reactive to light.  Neck:     Musculoskeletal: Normal range of motion and neck supple.  Cardiovascular:     Rate and Rhythm: Normal rate and regular rhythm.     Heart sounds: Normal heart sounds.  Pulmonary:     Effort: Pulmonary effort is normal.  Abdominal:     Palpations: Abdomen is soft.  Skin:    General: Skin is warm.    Psychiatric:        Behavior: Behavior normal.     Results for orders placed or performed in visit on 04/17/19  NASH FibroSURE  Result Value Ref Range   Fibrosis Score 0.10 0.00 - 0.21   Fibrosis Stage Comment    Steatosis Score 0.81 (H) 0.00 - 0.30   Steatosis Grade Comment    NASH Score 0.75 (H) 0.25   NASH Grade Comment    Height 64 in   Weight 172 LBS   ALPHA 2-MACROGLOBULINS, QN 214 110 - 276 mg/dL   Haptoglobin 162 37 - 355 mg/dL   Apolipoprotein A-1 164 116 - 209 mg/dL   Bilirubin, Total <0.1 0.0 - 1.2 mg/dL   GGT 46  0 - 60 IU/L   ALT (SGPT) P5P 58 (H) 0 - 40 IU/L   AST (SGOT) P5P 41 (H) 0 - 40 IU/L   Cholesterol, Total 253 (H) 100 - 199 mg/dL   Glucose 118 (H) 65 - 99 mg/dL   Triglycerides 260 (H) 0 - 149 mg/dL   Interpretations Comment    Fibrosis Scoring: Comment    Steatosis Grading Comment    NASH Scoring Comment    Limitations Comment    Comment Comment    Assessment and [place] was seen today for annual exam.  Diagnoses and all orders for this visit:  RLS (restless legs syndrome) -     gabapentin (NEURONTIN) 100 MG capsule; Take 1-3 capsules (100-300 mg total) by mouth at bedtime.  Insulin resistance Comments: Changed to IR version due to recall. Orders: -     metFORMIN (GLUCOPHAGE) 850 MG tablet; Take 1 tablet (850 mg total) by mouth 2 (two) times daily with a meal.  Avascular necrosis of hip, right (HCC)  Fibromyalgia  Abnormal LFTs  Obesity (BMI 30-39.9)  Hepatic steatosis   . Orders and follow up as documented in [place], reviewed diet, exercise and weight control, cardiovascular risk and specific lipid/LDL goals reviewed, reviewed medications and side effects in detail.  . Reviewed expectations re: course of current medical issues. . Outlined signs and symptoms indicating need for more acute intervention. . Patient verbalized understanding and all questions were answered. . Patient received an After Visit Summary.  Briscoe Deutscher, DO [place], [facility] 05/07/2019

## 2019-04-28 ENCOUNTER — Other Ambulatory Visit: Payer: Self-pay

## 2019-04-28 ENCOUNTER — Ambulatory Visit (INDEPENDENT_AMBULATORY_CARE_PROVIDER_SITE_OTHER): Payer: Medicare Other | Admitting: Family Medicine

## 2019-04-28 ENCOUNTER — Encounter: Payer: Self-pay | Admitting: Family Medicine

## 2019-04-28 VITALS — BP 120/85 | HR 73 | Temp 98.4°F | Ht 64.0 in | Wt 173.0 lb

## 2019-04-28 DIAGNOSIS — K760 Fatty (change of) liver, not elsewhere classified: Secondary | ICD-10-CM | POA: Diagnosis not present

## 2019-04-28 DIAGNOSIS — M87051 Idiopathic aseptic necrosis of right femur: Secondary | ICD-10-CM

## 2019-04-28 DIAGNOSIS — G2581 Restless legs syndrome: Secondary | ICD-10-CM

## 2019-04-28 DIAGNOSIS — R7989 Other specified abnormal findings of blood chemistry: Secondary | ICD-10-CM

## 2019-04-28 DIAGNOSIS — R945 Abnormal results of liver function studies: Secondary | ICD-10-CM

## 2019-04-28 DIAGNOSIS — E669 Obesity, unspecified: Secondary | ICD-10-CM

## 2019-04-28 DIAGNOSIS — M797 Fibromyalgia: Secondary | ICD-10-CM | POA: Diagnosis not present

## 2019-04-28 DIAGNOSIS — E88819 Insulin resistance, unspecified: Secondary | ICD-10-CM

## 2019-04-28 DIAGNOSIS — E8881 Metabolic syndrome: Secondary | ICD-10-CM

## 2019-04-28 MED ORDER — GABAPENTIN 100 MG PO CAPS: 100.0000 mg | ORAL_CAPSULE | Freq: Every day | ORAL | 3 refills | Status: DC

## 2019-04-28 MED ORDER — METFORMIN HCL 850 MG PO TABS: 850.0000 mg | ORAL_TABLET | Freq: Two times a day (BID) | ORAL | 3 refills | Status: DC

## 2019-04-29 DIAGNOSIS — N3946 Mixed incontinence: Secondary | ICD-10-CM | POA: Diagnosis not present

## 2019-04-30 ENCOUNTER — Ambulatory Visit (HOSPITAL_COMMUNITY)
Admission: RE | Admit: 2019-04-30 | Discharge: 2019-04-30 | Disposition: A | Discharge status: Home / Self Care | Payer: Medicare Other | Source: Ambulatory Visit | Attending: Gastroenterology | Admitting: Gastroenterology

## 2019-04-30 ENCOUNTER — Other Ambulatory Visit: Payer: Self-pay

## 2019-04-30 DIAGNOSIS — K838 Other specified diseases of biliary tract: Secondary | ICD-10-CM | POA: Diagnosis not present

## 2019-04-30 DIAGNOSIS — R748 Abnormal levels of other serum enzymes: Secondary | ICD-10-CM

## 2019-04-30 DIAGNOSIS — R140 Abdominal distension (gaseous): Secondary | ICD-10-CM | POA: Diagnosis not present

## 2019-04-30 DIAGNOSIS — K760 Fatty (change of) liver, not elsewhere classified: Secondary | ICD-10-CM | POA: Diagnosis not present

## 2019-05-06 ENCOUNTER — Telehealth: Payer: Self-pay | Admitting: Gastroenterology

## 2019-05-06 NOTE — Telephone Encounter (Signed)
Left patient detailed message, if she is referring to the IBgard that is actually over the counter medication and we cannot send in a script for that.

## 2019-05-06 NOTE — Telephone Encounter (Signed)
patient called would like to let us know that the sample that Dr. Tarri Glenn gave her did help and would [facility] She does not recall the name of the medication she just knows it was for IBS/bloating

## 2019-05-14 ENCOUNTER — Encounter: Payer: Self-pay | Admitting: Family Medicine

## 2019-06-10 ENCOUNTER — Other Ambulatory Visit: Payer: Self-pay | Admitting: *Deleted

## 2019-06-10 ENCOUNTER — Telehealth: Payer: Self-pay | Admitting: *Deleted

## 2019-06-10 ENCOUNTER — Encounter: Payer: Self-pay | Admitting: *Deleted

## 2019-06-10 MED ORDER — RIFAXIMIN 550 MG PO TABS: 550.0000 mg | ORAL_TABLET | Freq: Three times a day (TID) | ORAL | 0 refills | Status: AC

## 2019-06-10 NOTE — Telephone Encounter (Signed)
-----   Message from [place], MD sent at 06/09/2019  4:01 PM EDT ----- Regarding: Bacterial overgrowth testing results Please call the patient.  I have tried to reach her myself without success and do not want her to have to wait until I return to the office later this month for the results of her breath test.  It shows bacterial overgrowth.  I recommend a trial of Xifaxan 550 mg 3 times daily for 14 days.  She also may find following a low FODMAP diet improves her symptoms as well.  I would like her to follow-up in October to review these results and her response to therapy.  Thank you.

## 2019-06-10 NOTE — Telephone Encounter (Signed)
Xifaxan 550 mg TID x14 days sent to [facility] for PA.   Patient scheduled for f/u with Dr. Tarri Glenn on 10/13 at 3:40 pm.   Patient aware of FODMAP diet, information mailed to patient about the diet and an appt reminder.

## 2019-06-11 ENCOUNTER — Other Ambulatory Visit: Payer: Self-pay

## 2019-06-11 DIAGNOSIS — G2581 Restless legs syndrome: Secondary | ICD-10-CM

## 2019-06-11 MED ORDER — GABAPENTIN 100 MG PO CAPS: 100.0000 mg | ORAL_CAPSULE | Freq: Every day | ORAL | 3 refills | Status: DC

## 2019-06-23 ENCOUNTER — Other Ambulatory Visit: Payer: Self-pay | Admitting: Family Medicine

## 2019-06-23 DIAGNOSIS — Z1231 Encounter for screening mammogram for malignant neoplasm of breast: Secondary | ICD-10-CM

## 2019-06-26 DIAGNOSIS — N3942 Incontinence without sensory awareness: Secondary | ICD-10-CM | POA: Diagnosis not present

## 2019-07-21 ENCOUNTER — Ambulatory Visit: Payer: Medicare Other | Admitting: Gastroenterology

## 2019-07-29 ENCOUNTER — Ambulatory Visit: Payer: Medicare Other | Admitting: Family Medicine

## 2019-07-29 NOTE — Progress Notes (Deleted)
Margaret White is a 68 y.o. female is here for follow up.  History of Present Illness:   (SCRIBE ATTESTATION)  HPI:   Health Maintenance Due  Topic Date Due  . PNA vac Low Risk Adult (2 of 2 - PPSV23) 03/25/2019  . INFLUENZA VACCINE  05/09/2019   Depression screen [facility] 2/9 04/28/2019 03/30/2019 12/09/2018  Decreased Interest 0 0 0  Down, Depressed, Hopeless 0 0 0  PHQ - 2 Score 0 0 0  Altered sleeping 2 0 1  Tired, decreased energy 1 0 3  Change in appetite 1 0 2  Feeling bad or failure about yourself  0 0 0  Trouble concentrating 0 0 0  Moving slowly or fidgety/restless 0 0 0  Suicidal thoughts 0 0 0  PHQ-9 Score 4 0 6  Difficult doing work/chores Not difficult at all Not difficult at all Somewhat difficult   PMHx, SurgHx, SocialHx, FamHx, Medications, and Allergies were reviewed in the Visit Navigator and updated as appropriate.   Patient Active Problem List   Diagnosis Date Noted  . Chronic cough 12/09/2018  . SOB (shortness of breath) on exertion 05/14/2018  . Hypokalemia 05/14/2018  . Incontinence without sensory awareness 05/08/2018  . Mixed incontinence 05/08/2018  . 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 Statin intolerance 03/25/2018  . 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 Avascular necrosis ([place]) 03/24/2018  . Avascular necrosis of hip, right ([place]) 03/24/2018  . 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 GERD (gastroesophageal reflux disease) 10/14/2017  . Postmenopausal 10/14/2017  . Allergic rhinitis 10/14/2017  . Abnormal LFTs 10/14/2017  . Hepatic steatosis 10/14/2017  . OSA (obstructive sleep apnea) 10/14/2017  . RLS (restless legs syndrome) 10/14/2017  . History of cluster headache 10/14/2017  . IBS (irritable bowel syndrome) 10/14/2017  . Fibromyalgia 10/14/2017  . Chronic maxillary sinusitis  10/14/2017  . Pelvic pain 10/14/2017  . Obesity (BMI 30-39.9) 10/14/2017  . Mixed hyperlipidemia 10/14/2017  . Vitamin B12 deficiency 10/14/2017  . Vitamin D deficiency 10/14/2017  . Female cystocele 10/14/2017  . Primary osteoarthritis of both hands 07/10/2017  . Essential hypertension 06/26/2017  . Recurrent major depressive disorder, in full remission ([place]) 06/26/2017   Social History   Tobacco Use  . Smoking status: Never Smoker  . Smokeless tobacco: Never Used  Substance Use Topics  . Alcohol use: No    Frequency: Never  . Drug use: Never   Current Medications and Allergies   Current Outpatient Medications:  .  amLODipine (NORVASC) 5 MG tablet, Take 1 tablet (5 mg total) by mouth daily. (Patient taking differently: Take 5 mg by mouth daily. ), Disp: 90 tablet, Rfl: 2 .  esomeprazole (NEXIUM) 40 MG capsule, Take 1 capsule (40 mg total) by mouth 2 (two) times daily before a meal., Disp: 180 capsule, Rfl: 3 .  FLUoxetine (PROZAC) 20 MG tablet, Take 1 tablet (20 mg total) by mouth daily. (Patient taking differently: Take 20 mg by mouth daily. ), Disp: 90 tablet, Rfl: 1 .  fluticasone (FLONASE) 50 MCG/ACT nasal spray, Place 1 spray into both nostrils daily as needed for allergies., Disp: 16 g, Rfl: 3 .  gabapentin (NEURONTIN) 100 MG capsule, Take 1-3 capsules (100-300 mg total) by mouth at bedtime., Disp: 90 capsule, Rfl: 3 .  metFORMIN (GLUCOPHAGE) 850 MG tablet, Take 1 tablet (850 mg total) by mouth 2 (two)  times daily with a meal., Disp: 60 tablet, Rfl: 3 .  psyllium (METAMUCIL) 58.6 % powder, Take 1 packet by mouth 3 (three) times daily., Disp: , Rfl:   Allergies  Allergen Reactions  . Codeine Itching  . Sulfa Antibiotics Rash and Other (See Comments)    Flu like symptoms   Review of Systems   Pertinent items are noted in the HPI. Otherwise, a complete ROS is negative.  Vitals  There were no vitals filed for this visit.   There is no height or weight on file to  calculate BMI.  Physical Exam   Physical Exam  Results for orders placed or performed in visit on 04/17/19  NASH FibroSURE  Result Value Ref Range   Fibrosis Score 0.10 0.00 - 0.21   Fibrosis Stage Comment    Steatosis Score 0.81 (H) 0.00 - 0.30   Steatosis Grade Comment    NASH Score 0.75 (H) 0.25   NASH Grade Comment    Height 64 in   Weight 172 LBS   ALPHA 2-MACROGLOBULINS, QN 214 110 - 276 mg/dL   Haptoglobin 162 37 - 355 mg/dL   Apolipoprotein A-1 164 116 - 209 mg/dL   Bilirubin, Total <0.1 0.0 - 1.2 mg/dL   GGT 46 0 - 60 IU/L   ALT (SGPT) P5P 58 (H) 0 - 40 IU/L   AST (SGOT) P5P 41 (H) 0 - 40 IU/L   Cholesterol, Total 253 (H) 100 - 199 mg/dL   Glucose 118 (H) 65 - 99 mg/dL   Triglycerides 260 (H) 0 - 149 mg/dL   Interpretations Comment    Fibrosis Scoring: Comment    Steatosis Grading Comment    NASH Scoring Comment    Limitations Comment    Comment Comment     Assessment and Plan   There are no diagnoses linked to this encounter.  . Orders and follow up as documented in [place], reviewed diet, exercise and weight control, cardiovascular risk and specific lipid/LDL goals reviewed, reviewed medications and side effects in detail.  . Reviewed expectations re: course of current medical issues. . Outlined signs and symptoms indicating need for more acute intervention. . Patient verbalized understanding and all questions were answered. . Patient received an After Visit Summary.  *** CMA served as Education administrator during this visit. History, Physical, and Plan performed by medical provider. The above documentation has been reviewed and is accurate and complete. Briscoe Deutscher, D.O.  Briscoe Deutscher, DO [place], [facility] 07/29/2019

## 2019-08-07 ENCOUNTER — Other Ambulatory Visit: Payer: Self-pay

## 2019-08-07 ENCOUNTER — Ambulatory Visit
Admission: RE | Admit: 2019-08-07 | Discharge: 2019-08-07 | Disposition: A | Discharge status: Home / Self Care | Payer: Medicare Other | Source: Ambulatory Visit | Attending: Family Medicine | Admitting: Family Medicine

## 2019-08-07 DIAGNOSIS — Z1231 Encounter for screening mammogram for malignant neoplasm of breast: Secondary | ICD-10-CM | POA: Diagnosis not present

## 2019-08-12 ENCOUNTER — Other Ambulatory Visit: Payer: Self-pay | Admitting: Family Medicine

## 2019-08-12 ENCOUNTER — Other Ambulatory Visit: Payer: Self-pay | Admitting: *Deleted

## 2019-08-12 ENCOUNTER — Telehealth: Payer: Self-pay | Admitting: Gastroenterology

## 2019-08-12 DIAGNOSIS — R928 Other abnormal and inconclusive findings on diagnostic imaging of breast: Secondary | ICD-10-CM

## 2019-08-12 MED ORDER — RIFAXIMIN 550 MG PO TABS: 550.0000 mg | ORAL_TABLET | Freq: Three times a day (TID) | ORAL | 0 refills | Status: AC

## 2019-08-12 NOTE — Telephone Encounter (Signed)
Dr. Tarri Glenn, Margaret White-  Spoke to the patient who reported when [facility] called her about the Xifaxan prescription that was submitted 9/2, she stated because she was not familiar with them, she assumed it was a scam and she was not willing to give them her information for payment over the phone. She then stated [facility] was contacted by [facility] to fill the patients medication but it appears as if this did not happen. A new prescription was re-sent to [facility] for PA and subsequently sent to the patient. Patient is now aware [facility] is a credible pharmacy who will contact her for payment. Her f/u visit was moved to 15/15 at 3:50 pm.

## 2019-08-12 NOTE — Telephone Encounter (Signed)
I appreciate her being so cautious. Thanks for your help getting her prescription filled. w

## 2019-08-13 ENCOUNTER — Other Ambulatory Visit: Payer: Self-pay

## 2019-08-17 ENCOUNTER — Other Ambulatory Visit: Payer: Self-pay

## 2019-08-17 ENCOUNTER — Other Ambulatory Visit: Payer: Self-pay | Admitting: Family Medicine

## 2019-08-17 ENCOUNTER — Ambulatory Visit
Admission: RE | Admit: 2019-08-17 | Discharge: 2019-08-17 | Disposition: A | Discharge status: Home / Self Care | Payer: Medicare Other | Source: Ambulatory Visit | Attending: Family Medicine | Admitting: Family Medicine

## 2019-08-17 DIAGNOSIS — N6310 Unspecified lump in the right breast, unspecified quadrant: Secondary | ICD-10-CM

## 2019-08-17 DIAGNOSIS — R928 Other abnormal and inconclusive findings on diagnostic imaging of breast: Secondary | ICD-10-CM | POA: Diagnosis not present

## 2019-08-17 DIAGNOSIS — N6001 Solitary cyst of right breast: Secondary | ICD-10-CM | POA: Diagnosis not present

## 2019-08-17 NOTE — Telephone Encounter (Signed)
Copied from [place] [id number]. Topic: General - Other >> Aug 17, 2019 11:49 AM Rutherford Nail, NT wrote: Reason for CRM: Paulino Rily from [facility] calling and states that the patient is coming in today for an abnormal mammogram. States that they are needing the referral signed. Sending over referral now. Please advise.

## 2019-08-18 NOTE — Telephone Encounter (Signed)
Followed up with [facility] who stated they will reach out to the patient today for payment and to schedule delivery of Xifaxan.   Left message for the patient to answer the phone when they call.

## 2019-08-19 ENCOUNTER — Encounter: Payer: Self-pay | Admitting: Family Medicine

## 2019-08-19 ENCOUNTER — Ambulatory Visit: Payer: Medicare Other | Admitting: Family Medicine

## 2019-08-19 ENCOUNTER — Ambulatory Visit: Payer: Medicare Other

## 2019-08-19 ENCOUNTER — Ambulatory Visit (INDEPENDENT_AMBULATORY_CARE_PROVIDER_SITE_OTHER): Payer: Medicare Other | Admitting: Family Medicine

## 2019-08-19 ENCOUNTER — Other Ambulatory Visit: Payer: Self-pay

## 2019-08-19 VITALS — BP 130/90 | HR 75 | Temp 98.4°F | Ht 64.0 in | Wt 179.0 lb

## 2019-08-19 DIAGNOSIS — M797 Fibromyalgia: Secondary | ICD-10-CM | POA: Diagnosis not present

## 2019-08-19 DIAGNOSIS — K635 Polyp of colon: Secondary | ICD-10-CM | POA: Insufficient documentation

## 2019-08-19 DIAGNOSIS — G6289 Other specified polyneuropathies: Secondary | ICD-10-CM

## 2019-08-19 DIAGNOSIS — G5783 Other specified mononeuropathies of bilateral lower limbs: Secondary | ICD-10-CM | POA: Diagnosis not present

## 2019-08-19 DIAGNOSIS — D126 Benign neoplasm of colon, unspecified: Secondary | ICD-10-CM | POA: Insufficient documentation

## 2019-08-19 DIAGNOSIS — R2681 Unsteadiness on feet: Secondary | ICD-10-CM

## 2019-08-19 DIAGNOSIS — E559 Vitamin D deficiency, unspecified: Secondary | ICD-10-CM

## 2019-08-19 DIAGNOSIS — K589 Irritable bowel syndrome without diarrhea: Secondary | ICD-10-CM | POA: Diagnosis not present

## 2019-08-19 DIAGNOSIS — I10 Essential (primary) hypertension: Secondary | ICD-10-CM

## 2019-08-19 DIAGNOSIS — Z23 Encounter for immunization: Secondary | ICD-10-CM | POA: Diagnosis not present

## 2019-08-19 DIAGNOSIS — G2581 Restless legs syndrome: Secondary | ICD-10-CM | POA: Diagnosis not present

## 2019-08-19 LAB — VITAMIN D 25 HYDROXY (VIT D DEFICIENCY, FRACTURES): VITD: 36.46 ng/mL (ref 30.00–100.00)

## 2019-08-19 LAB — B12 AND FOLATE PANEL
Folate: 10.5 ng/mL (ref >5.9)
Vitamin B-12: 314 pg/mL (ref 211–911)

## 2019-08-19 LAB — TSH: TSH: 2.52 u[IU]/mL (ref 0.35–4.50)

## 2019-08-19 MED ORDER — GABAPENTIN 300 MG PO CAPS: ORAL_CAPSULE | ORAL | 5 refills | Status: DC

## 2019-08-19 NOTE — Telephone Encounter (Signed)
Called the patient, the patient reported she had not received a phone call from [facility]. The patient was given the number and told the f/u so the delivery of the Xifaxan would not be delayed.

## 2019-08-19 NOTE — Progress Notes (Signed)
Subjective  CC:  Chief Complaint  Patient presents with  . Bilateral feet pain  . Left hand pain  . Abdominal Pain with nausea    HPI: Margaret White is a 68 y.o. female who presents to the office today to address the problems listed above in the chief complaint.  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  I reviewed her chart in detail.  She has multiple medical problems including active fibromyalgia, history of depression, recent restless leg syndrome treated with gabapentin at night, IBS, hepatic steatosis followed by GI, irritable bowel syndrome, GERD on high-dose Nexium, hypertension, hyperlipidemia  Complains of paresthesias in the bottom of her feet over the last month or so.  Complains of burning pain worse after getting home after work.  Hurts with walking.  No rash.  No joint pain.  She admits to having chronic aches from fibromyalgia but this feels different.  No low back pain other than her osteoarthritis that is multiple sites.  She is on gabapentin at night that was started for mild restless leg symptoms.  Also complains of feeling of rawness in the lateral side of her left hand without rash.  Describes paresthesias.  Involves the pinky finger and lateral hand.  No carpal tunnel symptoms.  She does not take any medications for the symptoms.  Ongoing IBS-like symptoms with cramping and bloating.  She does see GI.  No blood in the stool.  Colorectal cancer screening is up-to-date.  Recent EGD has been done as well. Assessment  1. Other polyneuropathy   2. Need for prophylactic vaccination against Streptococcus pneumoniae (pneumococcus)   3. Fibromyalgia   4. RLS (restless legs syndrome)   5. Irritable bowel syndrome, unspecified type   6. Other specified mononeuropathies of bilateral lower limbs    7. Unsteadiness on feet    8. Vitamin D deficiency, unspecified    9. Essential hypertension   10. Polyp of  colon, unspecified part of colon, unspecified type      Plan   Peripheral neuropathy, new: Check lab work and increase gabapentin dose to 300 3 times daily, titrate up as tolerated.  Patient does suffer from chronic fatigue from fibromyalgia so we will see if she can tolerate it.  Consider EMG NCV testing if not improving.  Restless leg syndrome: Need to clarify this diagnosis.  She describes lots of movement at night although not necessarily awakened by it.  Could have periodic limb movement disorder.  Consider sleep study.  GI symptoms active: Follow-up with GI.  She is waiting on a prescription for Xifaxan.  Fibromyalgia: Currently untreated.  Follow up: Return in about 6 weeks (around 09/30/2019) for recheck.  Visit date not found  Orders Placed This Encounter  Procedures  . Pneumococcal polysaccharide vaccine 23-valent greater than or equal to 2yo subcutaneous/IM  . TSH  . B12 and Folate Panel  . Vit D 25OH   Meds ordered this encounter  Medications  . gabapentin (NEURONTIN) 300 MG capsule    Sig: Take 1 capsule (300 mg total) by mouth 2 (two) times daily for 14 days, THEN 1 capsule (300 mg total) 3 (three) times daily.    Dispense:  90 capsule    Refill:  5      I reviewed the patients updated PMH, FH, and SocHx.    Patient Active Problem List   Diagnosis Date Noted  . Colon polyps 08/19/2019    Priority: High  .  Statin intolerance 03/25/2018    Priority: High  . GERD (gastroesophageal reflux disease) 10/14/2017    Priority: High  . Hepatic steatosis 10/14/2017    Priority: High  . OSA (obstructive sleep apnea) 10/14/2017    Priority: High  . RLS (restless legs syndrome) 10/14/2017    Priority: High  . Fibromyalgia 10/14/2017    Priority: High  . Obesity (BMI 30-39.9) 10/14/2017    Priority: High  . Mixed hyperlipidemia 10/14/2017    Priority: High  . Essential hypertension 06/26/2017    Priority: High  . Incontinence without sensory awareness 05/08/2018     Priority: Medium  . Mixed incontinence 05/08/2018    Priority: Medium  . 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    Priority: Medium  . IBS (irritable bowel syndrome) 10/14/2017    Priority: Medium  . Recurrent major depressive disorder, in full remission ([place]) 06/26/2017    Priority: Medium  . 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    Priority: Low  . Allergic rhinitis 10/14/2017    Priority: Low  . Chronic maxillary sinusitis 10/14/2017    Priority: Low  . Vitamin B12 deficiency 10/14/2017    Priority: Low  . Vitamin D deficiency 10/14/2017    Priority: Low  . Female cystocele 10/14/2017    Priority: Low  . Chronic cough 12/09/2018  . 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 History of cluster headache 10/14/2017  . Pelvic pain 10/14/2017  . Primary osteoarthritis of both hands 07/10/2017   Current Meds  Medication Sig  . amLODipine (NORVASC) 5 MG tablet Take 1 tablet (5 mg total) by mouth daily. (Patient taking differently: Take 5 mg by mouth daily. )  . esomeprazole (NEXIUM) 40 MG capsule Take 1 capsule (40 mg total) by mouth 2 (two) times daily before a meal.  . FLUoxetine (PROZAC) 20 MG tablet Take 1 tablet (20 mg total) by mouth daily. (Patient taking differently: Take 20 mg by mouth daily. )  . fluticasone (FLONASE) 50 MCG/ACT nasal spray Place 1 spray into both nostrils daily as needed for allergies.  Marland Kitchen gabapentin (NEURONTIN) 300 MG capsule Take 1 capsule (300 mg total) by mouth 2 (two) times daily for 14 days, THEN 1 capsule (300 mg total) 3 (three) times daily.  . psyllium (METAMUCIL) 58.6 % powder Take 1 packet by mouth 3 (three) times daily.  . [DISCONTINUED] gabapentin (NEURONTIN) 100 MG capsule Take 1-3 capsules (100-300 mg total) by mouth at bedtime. (Patient taking differently: Take 300 mg by mouth at bedtime. )    Allergies: Patient is allergic to  codeine and sulfa antibiotics. Family History: Patient family history includes Cancer in her father; Heart disease in her father; Hypertension in her mother. Social History:  Patient  reports that she has never smoked. She has never used smokeless tobacco. She reports that she does not drink alcohol or use drugs.  Review of Systems: Constitutional: Negative for fever malaise or anorexia Cardiovascular: negative for chest pain Respiratory: negative for SOB or persistent cough Gastrointestinal: negative for abdominal pain  Objective  Vitals: BP 130/90 (BP Location: Left Arm, Patient Position: Sitting, Cuff Size: Normal)   Pulse 75   Temp 98.4 F (36.9 C) (Temporal)   Ht 5' 4"  (1.626 m)   Wt 179 lb (81.2 kg)   SpO2 94%   BMI 30.73 kg/m  General: no acute distress , A&Ox3, appears well HEENT: PEERL, conjunctiva normal, Oropharynx moist,neck  is supple Cardiovascular:  RRR without murmur or gallop.  Respiratory:  Good breath sounds bilaterally, CTAB with normal respiratory effort Skin:  Warm, no rashes Extremities: Normal peripheral pulses, osteoarthritic changes without inflammatory signs     Commons side effects, risks, benefits, and alternatives for medications and treatment plan prescribed today were discussed, and the patient expressed understanding of the given instructions. Patient is instructed to call or message via MyChart if he/she has any questions or concerns regarding our treatment plan. No barriers to understanding were identified. We discussed Red Flag symptoms and signs in detail. Patient expressed understanding regarding what to do in case of urgent or emergency type symptoms.   Medication list was reconciled, printed and provided to the patient in AVS. Patient instructions and summary information was reviewed with the patient as documented in the AVS. This note was prepared with assistance of Dragon voice recognition software. Occasional wrong-word or sound-a-like  substitutions may have occurred due to the inherent limitations of voice recognition software

## 2019-08-19 NOTE — Patient Instructions (Signed)
Please return in 4-6 weeks for recheck.   We will increase the dose of your gabapentin to see if this will improve your neuropathy symptoms. It should.  I will release your lab results to you on your MyChart account with further instructions. Please reply with any questions.    It was a pleasure meeting you today! Thank you for choosing [place] to meet your healthcare needs! I truly look forward to working with you. If you have any questions or concerns, please send me a message via Mychart or call the office at [phone].   Peripheral Neuropathy Peripheral neuropathy is a type of nerve damage. It affects nerves that carry signals between the spinal cord and the arms, legs, and the rest of the body (peripheral nerves). It does not affect nerves in the spinal cord or brain. In peripheral neuropathy, one nerve or a group of nerves may be damaged. Peripheral neuropathy is a broad category that includes many specific nerve disorders, like diabetic neuropathy, hereditary neuropathy, and carpal tunnel syndrome. What are the causes? This condition may be caused by:  Diabetes. This is the most common cause of peripheral neuropathy.  Nerve injury.  Pressure or stress on a nerve that lasts a long time.  Lack (deficiency) of B vitamins. This can result from alcoholism, poor diet, or a restricted diet.  Infections.  Autoimmune diseases, such as rheumatoid arthritis and systemic lupus erythematosus.  Nerve diseases that are passed from parent to child (inherited).  Some medicines, such as cancer medicines (chemotherapy).  Poisonous (toxic) substances, such as lead and mercury.  Too little blood flowing to the legs.  Kidney disease.  Thyroid disease. In some cases, the cause of this condition is not known. What are the signs or symptoms? Symptoms of this condition depend on which of your nerves is damaged. Common symptoms include:  Loss of feeling (numbness) in the feet, hands, or both.   Tingling in the feet, hands, or both.  Burning pain.  Very sensitive skin.  Weakness.  Not being able to move a part of the body (paralysis).  Muscle twitching.  Clumsiness or poor coordination.  Loss of balance.  Not being able to control your bladder.  Feeling dizzy.  Sexual problems. How is this diagnosed? Diagnosing and finding the cause of peripheral neuropathy can be difficult. Your health care provider will take your medical history and do a physical exam. A neurological exam will also be done. This involves checking things that are affected by your brain, spinal cord, and nerves (nervous system). For example, your health care provider will check your reflexes, how you move, and what you can feel. You may have other tests, such as:  Blood tests.  Electromyogram (EMG) and nerve conduction tests. These tests check nerve function and how well the nerves are controlling the muscles.  Imaging tests, such as CT scans or MRI to rule out other causes of your symptoms.  Removing a small piece of nerve to be examined in a lab (nerve biopsy). This is rare.  Removing and examining a small amount of the fluid that surrounds the brain and spinal cord (lumbar puncture). This is rare. How is this treated? Treatment for this condition may involve:  Treating the underlying cause of the neuropathy, such as diabetes, kidney disease, or vitamin deficiencies.  Stopping medicines that can cause neuropathy, such as chemotherapy.  Medicine to relieve pain. Medicines may include: ? Prescription or over-the-counter pain medicine. ? Antiseizure medicine. ? Antidepressants. ? Pain-relieving patches that  are applied to painful areas of skin.  Surgery to relieve pressure on a nerve or to destroy a nerve that is causing pain.  Physical therapy to help improve movement and balance.  Devices to help you move around (assistive devices). Follow these instructions at home: Medicines  Take  over-the-counter and prescription medicines only as told by your health care provider. Do not take any other medicines without first asking your health care provider.  Do not drive or use heavy machinery while taking prescription pain medicine. Lifestyle   Do not use any products that contain nicotine or tobacco, such as cigarettes and e-cigarettes. Smoking keeps blood from reaching damaged nerves. If you need help quitting, ask your health care provider.  Avoid or limit alcohol. Too much alcohol can cause a vitamin B deficiency, and vitamin B is needed for healthy nerves.  Eat a healthy diet. This includes: ? Eating foods that are high in fiber, such as fresh fruits and vegetables, whole grains, and beans. ? Limiting foods that are high in fat and processed sugars, such as fried or sweet foods. General instructions   If you have diabetes, work closely with your health care provider to keep your blood sugar under control.  If you have numbness in your feet: ? Check every day for signs of injury or infection. Watch for redness, warmth, and swelling. ? Wear padded socks and comfortable shoes. These help protect your feet.  Develop a good support system. Living with peripheral neuropathy can be stressful. Consider talking with a mental health specialist or joining a support group.  Use assistive devices and attend physical therapy as told by your health care provider. This may include using a walker or a cane.  Keep all follow-up visits as told by your health care provider. This is important. Contact a health care provider if:  You have new signs or symptoms of peripheral neuropathy.  You are struggling emotionally from dealing with peripheral neuropathy.  Your pain is not well-controlled. Get help right away if:  You have an injury or infection that is not healing normally.  You develop new weakness in an arm or leg.  You fall frequently. Summary  Peripheral neuropathy is  when the nerves in the arms, or legs are damaged, resulting in numbness, weakness, or pain.  There are many causes of peripheral neuropathy, including diabetes, pinched nerves, vitamin deficiencies, autoimmune disease, and hereditary conditions.  Diagnosing and finding the cause of peripheral neuropathy can be difficult. Your health care provider will take your medical history, do a physical exam, and do tests, including blood tests and nerve function tests.  Treatment involves treating the underlying cause of the neuropathy and taking medicines to help control pain. Physical therapy and assistive devices may also help. This information is not intended to replace advice given to you by your health care provider. Make sure you discuss any questions you have with your health care provider. Document Released: 09/14/2002 Document Revised: 09/06/2017 Document Reviewed: 12/03/2016 Elsevier Patient Education  2020 [employer].

## 2019-08-24 ENCOUNTER — Ambulatory Visit: Payer: Medicare Other | Admitting: Gastroenterology

## 2019-08-27 NOTE — Telephone Encounter (Signed)
Followed up with patient who received Xifaxan from [facility] and will start administration tomorrow morning.

## 2019-09-02 ENCOUNTER — Other Ambulatory Visit: Payer: Self-pay | Admitting: Family Medicine

## 2019-09-02 DIAGNOSIS — I10 Essential (primary) hypertension: Secondary | ICD-10-CM

## 2019-09-08 ENCOUNTER — Ambulatory Visit: Payer: Medicare Other | Admitting: Family Medicine

## 2019-09-22 ENCOUNTER — Ambulatory Visit (INDEPENDENT_AMBULATORY_CARE_PROVIDER_SITE_OTHER): Payer: Medicare Other | Admitting: Gastroenterology

## 2019-09-22 ENCOUNTER — Encounter: Payer: Self-pay | Admitting: Gastroenterology

## 2019-09-22 VITALS — BP 120/84 | HR 91 | Temp 97.0°F | Ht 64.0 in | Wt 182.1 lb

## 2019-09-22 DIAGNOSIS — K6389 Other specified diseases of intestine: Secondary | ICD-10-CM

## 2019-09-22 DIAGNOSIS — K7581 Nonalcoholic steatohepatitis (NASH): Secondary | ICD-10-CM | POA: Diagnosis not present

## 2019-09-22 DIAGNOSIS — R1013 Epigastric pain: Secondary | ICD-10-CM

## 2019-09-22 NOTE — Progress Notes (Signed)
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        - Increase in hydrogen 28ppp, increase in Methan 4ppm       - Increase in combined hydrogen and methane 32ppm       - Xifaxan 550 mg TID and Neomycin 500 mg BID x 14 days in November NSAID-related gastric erosions on EGD 03/25/19    - biopsies negative for H pylori Previously using daily NSAIDs (Excedrin twice daily for headaches) Personal history of colon polyps    -Polyps removed on colonoscopy in [place]    -Tubular adenoma removed by Dr. Lyndel Safe 10/20/2015    -Surveillance endoscopy recommended in 5 years (2022) History of IBS with alternating diarrhea and constipation    - previously treated with Imodium    - currently using Metamucil Hemorrhoids  Dyspepsia +/- GERD improved on twice daily PPI therapy and avoidance of NSAIDs.  Bacterial overgrowth responded to Xifaxan. Will need to plan retreatment with combination Xifaxan and Neomycin 500 mg BID with recurrent symptoms given her breath test results.   NASH diagnosis supported by Fibrosure and elastography with no significant fibrosis.  Supported by HOMA IR of 3.9. Reviewed the natural history and treatment of fatty liver disease. Given her history of diabetes and insulin resistance, she is at risk for fibrosis. Will restage her liver disease annual to monitor for disease progression.    PLAN: - I did not change any medications today. Continue Nexium.  - Annual follow-up recommended to monitor for disease progression of NASH - Xifaxan 550 mg TID and Neomycin 500 mg BID x 14 days with recurrent bloating - Follow-up with Dr. 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 She was last seen 04/16/19.  The interval history is obtained through the patient and review of her electronic health record.  History of recurrent upper respiratory infections, sinus and bronchial symptoms as well as a history of seasonal allergies and eczema.  She has a small 5 mm left lower lobe lung nodule with a CT scan recommended in 1 year.   She had an EGD 03/25/19 that revealed reactive gastropathy and gastric erosions. Biopsies were negative for H pylori and celiac.   History of dyspepsia with a prior diagnosis of reflux.  She was taking Nexium but stopped because she was worried about potential side effects.  She had frequent dyspepsia and intermittent postprandial vomiting. Chest pain, heartburn, dysphonia, cough, "esophageal rawness", brash have improved on twice daily PPI. Continues to have some bloating with associated distension. Frequent eructation.  Feels like she looks pregnant. Not related to eating or defecation.  Has tried Metamucil daily for her known diagnosis of IBS with alternating diarrhea and constipation instead of Imodium AD.   No long using Excedrin  for migraines and cluster headaches. Denies the use of any NSAIDs.    Recent evaluation of symptoms has included:  H. pylori screen was negative 12/09/2018. Labs from 12/09/2018 show a normal comprehensive metabolic panel except for an ALT of 40.  AST is 27, alk phos 83, total bilirubin 0.4.  Her ALT has been persistently elevated on every check back from September 2018.  Platelets were 297 on 12/09/2018.  On that same day glucose was 114, hemoglobin  A1c 6.2. Abdominal ultrasound 12/18/2018 showed mildly increased echogenic liver. No other abnormalities seen. EGD 03/25/19: reactive gastropathy and gastric erosions. Biopsies were negative for H pylori and celiac but positive for reactive gastropathy.  Hydrogen and Methane Analysis 04/20/19 + baceterial overgrowth. Increase in hydrogen 28ppp, increase in Methan 4ppm, increased in combained hydrogen and methan 32ppm  Colonoscopy with Dr. Lyndel Safe 10/20/2015 showed a small ascending colon polyp and sigmoid diverticulosis.  Surveillance endoscopy recommended in 5 years.  Staging of liver disease that occurred since her last visit: NASH FibroSURE 04/17/19 F0 S3 Ferritin 56.6, iron 126 HOMA 3.9  [place] Elastography 04/30/19 F0/F1 with minimal fibrosis  Feeling better since being back on Nexium. No chest pain, difficulty breathing, or heaviness. Bloating has improved with Xifaxan 550 mg TID. No on going GI complaints at this time.   Past Medical History:  Diagnosis Date  . Allergic rhinitis   . Anxiety   . Avascular necrosis of hip, right ([place]) 03/24/2018  . Chronic cluster headache   . Chronic sinusitis   . Diabetes mellitus type 2, diet-controlled ([place])    per pt currently no taking metformin, followed by pcp  . Eczema   . Essential hypertension    cardiologsit-- dr dr Bettina Gavia  . Fibromyalgia   . GERD (gastroesophageal reflux disease)   . Hepatic steatosis 10/14/2017  . Hiatal hernia   . 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 History of colon polyps   . History of hyperthyroidism 2005  . History of pneumonia, recurrent   . History of seizure 2006   x1  . IBS (irritable bowel syndrome)   . Mixed hyperlipidemia   . Nodule of lower lobe of left lung 10/2018   69m;    pulmologist-- dr iMeda Coffee bLeory Plowman stable per lov note in epic  . Ocular rosacea   . OSA (obstructive sleep apnea)    per pt intolerant to cpap due to sinus issues,  uses mouth guard   . [place] 11/[place]   [facility]   . Primary osteoarthritis of both hands 07/10/2017  . Recurrent major depressive disorder, in full remission ([place] 06/26/2017  . RLS (restless legs syndrome)   . Seasonal allergies   . Solitary pulmonary nodule    pulmologist-- dr i. bLeory Plowman, lov note in epic,  stable  . 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 Shallow disc bulging without significant neuroforaminal stenosis.  .Marland KitchenSUI (stress urinary incontinence, female)   . Vitamin B 12 deficiency   . Vitamin D deficiency     Past Surgical History:  Procedure Laterality Date  . ABDOMINOPLASTY  2006  .  ANAL FISSURE REPAIR  2006  . BLADDER SURGERY  1984  . BREAST BIOPSY Right     benign    . CARPAL TUNNEL RELEASE Bilateral 1989  . CATARACT EXTRACTION W/ INTRAOCULAR LENS  IMPLANT, BILATERAL  2017  . COLONOSCOPY W/ POLYPECTOMY  2017  . [place]  . EYE SURGERY  child    x4  1957-1960  . INCONTINENCE SURGERY    . NASAL SEPTUM SURGERY  1987  . PUBOVAGINAL SLING N/A 03/24/2019   Procedure: CYSTOSCOPY  Gaynelle Arabian;  Surgeon: Bjorn Loser, MD;  Location: [facility];  Service: Urology;  Laterality: N/A;  . RHINOPLASTY  2007  . TOTAL ABDOMINAL HYSTERECTOMY W/ BILATERAL SALPINGOOPHORECTOMY  1984   w/ Marshall-Marchetti (bladder tack suspension)  . TOTAL HIP ARTHROPLASTY Right 04/11/2018   Procedure: RIGHT TOTAL HIP ARTHROPLASTY ANTERIOR APPROACH;  Surgeon: Mcarthur Rossetti, MD;  Location: [facility];  Service: Orthopedics;  Laterality: Right;  . ULNAR NERVE TRANSPOSITION Left 1990s     Current Outpatient Medications  Medication Sig Dispense Refill  . amLODipine (NORVASC) 5 MG tablet TAKE 1 TABLET DAILY 90 tablet 3  . esomeprazole (NEXIUM) 40 MG capsule Take 1 capsule (40 mg total) by mouth 2 (two) times daily before a meal. 180 capsule 3  . FLUoxetine (PROZAC) 20 MG tablet Take 1 tablet (20 mg total) by mouth daily. (Patient taking differently: Take 20 mg by mouth daily. ) 90 tablet 1  . fluticasone (FLONASE) 50 MCG/ACT nasal spray Place 1 spray into both nostrils daily as needed for allergies. 16 g 3  . gabapentin (NEURONTIN) 300 MG capsule Take 1 capsule (300 mg total) by mouth 2 (two) times daily for 14 days, THEN 1 capsule (300 mg total) 3 (three) times daily. 90 capsule 5  . psyllium (METAMUCIL) 58.6 % powder Take 1 packet by mouth 3 (three) times daily.     No current facility-administered medications for this visit.    Allergies as of 09/22/2019 - Review Complete 08/19/2019  Allergen Reaction Noted  . Codeine Itching 01/10/2016  . Sulfa antibiotics Rash and Other (See Comments) 01/10/2016    Family History  Problem Relation Age of Onset  . Hypertension Mother   . Cancer Father   . Heart disease Father   . Colon cancer Neg Hx   . Esophageal cancer Neg Hx   . Rectal cancer Neg Hx   . Stomach cancer Neg Hx     Social History   Socioeconomic History  . Marital status: Married    Spouse name: Not on file  . Number of children: Not on file  . Years of education: Not on file  . Highest education level: Not on file  Occupational History  . Occupation: Product manager: [employer]  Tobacco Use  . Smoking status: Never Smoker  . Smokeless tobacco: Never Used  Substance and Sexual Activity  . Alcohol use: No  . Drug use: Never  . Sexual activity: Yes    Birth control/protection: Surgical  Other Topics Concern  . Not on file  Social History Narrative  . Not on file   Social Determinants of Health   Financial Resource Strain:    . Difficulty of Paying Living Expenses: Not on file  Food Insecurity:   . Worried About Charity fundraiser in the Last Year: Not on file  . Ran Out of Food in the Last Year: Not on file  Transportation Needs:   .  Lack of Transportation (Medical): Not on file  . Lack of Transportation (Non-Medical): Not on file  Physical Activity:   . Days of Exercise per Week: Not on file  . Minutes of Exercise per Session: Not on file  Stress:   . Feeling of Stress : Not on file  Social Connections:   . Frequency of Communication with Friends and Family: Not on file  . Frequency of Social Gatherings with Friends and Family: Not on file  . Attends Religious Services: Not on file  . Active Member of Clubs or Organizations: Not on file  . Attends Archivist Meetings: Not on file  . Marital Status: Not on file  Intimate Partner Violence:   . Fear of Current or Ex-Partner: Not on file  . Emotionally Abused: Not on file  . Physically Abused: Not on file  . Sexually Abused: Not on file    Physical Exam: General: in no acute distress Neuro: Alert and appropriate Psych: Normal affect and normal insight   Doriana Mazurkiewicz L. Tarri Glenn, MD, MPH [facility] 09/22/2019, 3:44 PM

## 2019-09-23 ENCOUNTER — Other Ambulatory Visit: Payer: Self-pay | Admitting: Family Medicine

## 2019-09-23 DIAGNOSIS — F3342 Major depressive disorder, recurrent, in full remission: Secondary | ICD-10-CM

## 2019-09-24 ENCOUNTER — Other Ambulatory Visit: Payer: Self-pay | Admitting: Family Medicine

## 2019-09-24 DIAGNOSIS — J320 Chronic maxillary sinusitis: Secondary | ICD-10-CM

## 2019-09-24 DIAGNOSIS — G2581 Restless legs syndrome: Secondary | ICD-10-CM

## 2019-10-06 ENCOUNTER — Ambulatory Visit: Payer: Medicare Other | Admitting: Family Medicine

## 2019-10-29 ENCOUNTER — Other Ambulatory Visit: Payer: Self-pay

## 2019-10-30 ENCOUNTER — Ambulatory Visit (INDEPENDENT_AMBULATORY_CARE_PROVIDER_SITE_OTHER): Payer: Medicare Other | Admitting: Family Medicine

## 2019-10-30 ENCOUNTER — Encounter: Payer: Self-pay | Admitting: Family Medicine

## 2019-10-30 VITALS — BP 146/84 | HR 86 | Temp 98.1°F | Ht 64.0 in | Wt 183.2 lb

## 2019-10-30 DIAGNOSIS — F3342 Major depressive disorder, recurrent, in full remission: Secondary | ICD-10-CM

## 2019-10-30 DIAGNOSIS — M797 Fibromyalgia: Secondary | ICD-10-CM | POA: Diagnosis not present

## 2019-10-30 DIAGNOSIS — M19042 Primary osteoarthritis, left hand: Secondary | ICD-10-CM | POA: Diagnosis not present

## 2019-10-30 DIAGNOSIS — M19041 Primary osteoarthritis, right hand: Secondary | ICD-10-CM | POA: Diagnosis not present

## 2019-10-30 DIAGNOSIS — M47816 Spondylosis without myelopathy or radiculopathy, lumbar region: Secondary | ICD-10-CM | POA: Diagnosis not present

## 2019-10-30 DIAGNOSIS — G4733 Obstructive sleep apnea (adult) (pediatric): Secondary | ICD-10-CM

## 2019-10-30 MED ORDER — DULOXETINE HCL 30 MG PO CPEP: 30.0000 mg | ORAL_CAPSULE | Freq: Every day | ORAL | 3 refills | Status: DC

## 2019-10-30 MED ORDER — GABAPENTIN 300 MG PO CAPS: 300.0000 mg | ORAL_CAPSULE | Freq: Three times a day (TID) | ORAL | 3 refills | Status: DC

## 2019-10-30 NOTE — Patient Instructions (Signed)
Please return in 3 months for recheck and for your annual complete physical; please come fasting.  Please start the 38m of gabapentin twice a day for 2 weeks, then increase to 3x/day.  Add the cymbalta in 2 weeks.   If you have any questions or concerns, please don't hesitate to send me a message via MyChart or call the office at [phone] Thank you for visiting with [facility]! It's our pleasure caring for you.   Myofascial Pain Syndrome and Fibromyalgia Myofascial pain syndrome and fibromyalgia are both pain disorders. This pain may be felt mainly in your muscles.  Myofascial pain syndrome: ? Always has tender points in the muscle that will cause pain when pressed (trigger points). The pain may come and go. ? Usually affects your neck, upper back, and shoulder areas. The pain often radiates into your arms and hands.  Fibromyalgia: ? Has muscle pains and tenderness that come and go. ? Is often associated with fatigue and sleep problems. ? Has trigger points. ? Tends to be long-lasting (chronic), but is not life-threatening. Fibromyalgia and myofascial pain syndrome are not the same. However, they often occur together. If you have both conditions, each can make the other worse. Both are common and can cause enough pain and fatigue to make day-to-day activities difficult. Both can be hard to diagnose because their symptoms are common in many other conditions. What are the causes? The exact causes of these conditions are not known. What increases the risk? You are more likely to develop this condition if:  You have a family history of the condition.  You have certain triggers, such as: ? Spine disorders. ? An injury (trauma) or other physical stressors. ? Being under a lot of stress. ? Medical conditions such as osteoarthritis, rheumatoid arthritis, or lupus. What are the signs or symptoms? Fibromyalgia The main symptom of fibromyalgia is widespread pain and tenderness in your  muscles. Pain is sometimes described as stabbing, shooting, or burning. You may also have:  Tingling or numbness.  Sleep problems and fatigue.  Problems with attention and concentration (fibro fog). Other symptoms may include:  Bowel and bladder problems.  Headaches.  Visual problems.  Problems with odors and noises.  Depression or mood changes.  Painful menstrual periods (dysmenorrhea).  Dry skin or eyes. These symptoms can vary over time. Myofascial pain syndrome Symptoms of myofascial pain syndrome include:  Tight, ropy bands of muscle.  Uncomfortable sensations in muscle areas. These may include aching, cramping, burning, numbness, tingling, and weakness.  Difficulty moving certain parts of the body freely (poor range of motion). How is this diagnosed? This condition may be diagnosed by your symptoms and medical history. You will also have a physical exam. In general:  Fibromyalgia is diagnosed if you have pain, fatigue, and other symptoms for more than 3 months, and symptoms cannot be explained by another condition.  Myofascial pain syndrome is diagnosed if you have trigger points in your muscles, and those trigger points are tender and cause pain elsewhere in your body (referred pain). How is this treated? Treatment for these conditions depends on the type that you have.  For fibromyalgia: ? Pain medicines, such as NSAIDs. ? Medicines for treating depression. ? Medicines for treating seizures. ? Medicines that relax the muscles.  For myofascial pain: ? Pain medicines, such as NSAIDs. ? Cooling and stretching of muscles. ? Trigger point injections. ? Sound wave (ultrasound) treatments to stimulate muscles. Treating these conditions often requires a team of health care  providers. These may include:  Your primary care provider.  Physical therapist.  Complementary health care providers, such as massage therapists or acupuncturists.  Psychiatrist for  cognitive behavioral therapy. Follow these instructions at home: Medicines  Take over-the-counter and prescription medicines only as told by your health care provider.  Do not drive or use heavy machinery while taking prescription pain medicine.  If you are taking prescription pain medicine, take actions to prevent or treat constipation. Your health care provider may recommend that you: ? Drink enough fluid to keep your urine pale yellow. ? Eat foods that are high in fiber, such as fresh fruits and vegetables, whole grains, and beans. ? Limit foods that are high in fat and processed sugars, such as fried or sweet foods. ? Take an over-the-counter or prescription medicine for constipation. Lifestyle   Exercise as directed by your health care provider or physical therapist.  Practice relaxation techniques to control your stress. You may want to try: ? Biofeedback. ? Visual imagery. ? Hypnosis. ? Muscle relaxation. ? Yoga. ? Meditation.  Maintain a healthy lifestyle. This includes eating a healthy diet and getting enough sleep.  Do not use any products that contain nicotine or tobacco, such as cigarettes and e-cigarettes. If you need help quitting, ask your health care provider. General instructions  Talk to your health care provider about complementary treatments, such as acupuncture or massage.  Consider joining a support group with others who are diagnosed with this condition.  Do not do activities that stress or strain your muscles. This includes repetitive motions and heavy lifting.  Keep all follow-up visits as told by your health care provider. This is important. Where to find more information  National Fibromyalgia Association: www.fmaware.[place]: www.arthritis.org  American Chronic Pain Association: www.theacpa.org Contact a health care provider if:  You have new symptoms.  Your symptoms get worse or your pain is severe.  You have side  effects from your medicines.  You have trouble sleeping.  Your condition is causing depression or anxiety. Summary  Myofascial pain syndrome and fibromyalgia are pain disorders.  Myofascial pain syndrome has tender points in the muscle that will cause pain when pressed (trigger points). Fibromyalgia also has muscle pains and tenderness that come and go, but this condition is often associated with fatigue and sleep disturbances.  Fibromyalgia and myofascial pain syndrome are not the same but often occur together, causing pain and fatigue that make day-to-day activities difficult.  Treatment for fibromyalgia includes taking medicines to relax the muscles and medicines for pain, depression, or seizures. Treatment for myofascial pain syndrome includes taking medicines for pain, cooling and stretching of muscles, and injecting medicines into trigger points.  Follow your health care provider's instructions for taking medicines and maintaining a healthy lifestyle. This information is not intended to replace advice given to you by your health care provider. Make sure you discuss any questions you have with your health care provider. Document Revised: 01/16/2019 Document Reviewed: 10/09/2017 Elsevier Patient Education  2020 [employer].

## 2019-10-30 NOTE — Progress Notes (Signed)
Subjective  CC:  Chief Complaint  Patient presents with  . Peripheral Neuropathy    not much improvement  . Hypertension  . Fibromyalgia    pt under a lot of stress. Has not been sleeping.    HPI: Margaret White is a 69 y.o. female who presents to the office today to address the problems listed above in the chief complaint.  Short-term follow-up for 69 year old female with multiple medical problems including uncontrolled fibromyalgia, cold lower extremities interfering with sleep, chronic insomnia, depression, sleep apnea and low back pain.  She is stable but continues to be unwell overall.  Poor sleep in part due to worsening stressors including her mother in the end-stage, of her life, Covid pandemic etc. she reports she has increasing sleeping in part because she feels like she has moved to keep her limbs warm.  She describes of bone showing cold.  She does not have a sensation of needing to move her legs consistent with restless leg syndrome.  She denies numbness or tingling at this time although they were present 6 weeks ago.  She is on low-dose gabapentin.  She has failed Ambien, melatonin, over-the-counter sleep medicines and has suffered from chronic insomnia.  She also has chronic depression on Prozac.  She suffers from sleep apnea but cannot tolerate CPAP machine due to TMJ and migraines.  She does wear a mouthguard for this.  She has a history of chronic low back pain treated by pain management past.  She reports pain medicines were not helpful.  Her fibromyalgia was diagnosed when she was in her 26s.  She has never been on Cymbalta or Lyrica. Assessment  1. Fibromyalgia   2. Primary osteoarthritis of both hands   3. Recurrent major depressive disorder, in full remission ([place])   4. OSA (obstructive sleep apnea)   5. Spondylosis of lumbar region without myelopathy or radiculopathy      Plan   Fibromyalgia: Counseling done.  This seems to be her main problem.  We will try to  increase gabapentin to 300 3 times daily and then start Cymbalta.  Education given close follow-up recommended.  Insomnia: May consider trazodone.  Depression: Seems controlled on Prozac  Follow up: Return in about 3 months (around 01/28/2020) for complete physical, recheck.  Visit date not found  No orders of the defined types were placed in this encounter.  Meds ordered this encounter  Medications  . gabapentin (NEURONTIN) 300 MG capsule    Sig: Take 1 capsule (300 mg total) by mouth 3 (three) times daily.    Dispense:  270 capsule    Refill:  3  . DULoxetine (CYMBALTA) 30 MG capsule    Sig: Take 1 capsule (30 mg total) by mouth daily.    Dispense:  90 capsule    Refill:  3      I reviewed the patients updated PMH, FH, and SocHx.    Patient Active Problem List   Diagnosis Date Noted  . Colon polyps 08/19/2019    Priority: High  . Statin intolerance 03/25/2018    Priority: High  . GERD (gastroesophageal reflux disease) 10/14/2017    Priority: High  . Hepatic steatosis 10/14/2017    Priority: High  . OSA (obstructive sleep apnea) 10/14/2017    Priority: High  . Fibromyalgia 10/14/2017    Priority: High  . Obesity (BMI 30-39.9) 10/14/2017    Priority: High  . Mixed hyperlipidemia 10/14/2017    Priority: High  . Essential hypertension 06/26/2017  Priority: High  . Incontinence without sensory awareness 05/08/2018    Priority: Medium  . Mixed incontinence 05/08/2018    Priority: Medium  . 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    Priority: Medium  . IBS (irritable bowel syndrome) 10/14/2017    Priority: Medium  . Recurrent major depressive disorder, in full remission ([place]) 06/26/2017    Priority: Medium  . 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    Priority: Low  . Allergic rhinitis 10/14/2017    Priority: Low  . Chronic maxillary sinusitis 10/14/2017    Priority: Low  . Vitamin B12 deficiency 10/14/2017    Priority: Low  . Vitamin D deficiency 10/14/2017    Priority: Low  .  Female cystocele 10/14/2017    Priority: Low  . Chronic cough 12/09/2018  . 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 History of cluster headache 10/14/2017  . Pelvic pain 10/14/2017  . Primary osteoarthritis of both hands 07/10/2017   Current Meds  Medication Sig  . amLODipine (NORVASC) 5 MG tablet TAKE 1 TABLET DAILY  . esomeprazole (NEXIUM) 40 MG capsule Take 1 capsule (40 mg total) by mouth 2 (two) times daily before a meal.  . FLUoxetine (PROZAC) 20 MG tablet TAKE 1 TABLET DAILY  . fluticasone (FLONASE) 50 MCG/ACT nasal spray USE 1 SPRAY IN EACH NOSTRIL DAILY AS NEEDED FOR ALLERGIES  . [DISCONTINUED] gabapentin (NEURONTIN) 100 MG capsule TAKE 1 TO 3 CAPSULES AT BEDTIME    Allergies: Patient is allergic to codeine and sulfa antibiotics. Family History: Patient family history includes Cancer in her father; Heart disease in her father; Hypertension in her mother. Social History:  Patient  reports that she has never smoked. She has never used smokeless tobacco. She reports that she does not drink alcohol or use drugs.  Review of Systems: Constitutional: Negative for fever malaise or anorexia Cardiovascular: negative for chest pain Respiratory: negative for SOB or persistent cough Gastrointestinal: negative for abdominal pain  Objective  Vitals: BP (!) 146/84 (BP Location: Right Arm, Patient Position: Sitting, Cuff Size: Normal)   Pulse 86   Temp 98.1 F (36.7 C) (Temporal)   Ht 5' 4"  (1.626 m)   Wt 183 lb 3.2 oz (83.1 kg)   SpO2 95%   BMI 31.45 kg/m  General: no acute distress , A&Ox3      Commons side effects, risks, benefits, and alternatives for medications and treatment plan prescribed today were discussed, and the patient expressed understanding of the given instructions. Patient is instructed to call or message via MyChart if he/she has  any questions or concerns regarding our treatment plan. No barriers to understanding were identified. We discussed Red Flag symptoms and signs in detail. Patient expressed understanding regarding what to do in case of urgent or emergency type symptoms.   Medication list was reconciled, printed and provided to the patient in AVS. Patient instructions and summary information was reviewed with the patient as documented in the AVS. This note was prepared with assistance of Dragon voice recognition software. Occasional wrong-word or sound-a-like substitutions may have occurred due to the inherent limitations of voice recognition software  This visit occurred during the SARS-CoV-2 public health emergency.  Safety protocols were in place, including screening questions prior to the visit, additional usage of staff PPE, and extensive cleaning of exam room while observing appropriate contact time as indicated for disinfecting solutions.

## 2019-11-04 ENCOUNTER — Encounter: Payer: Self-pay | Admitting: Pulmonary Disease

## 2019-11-04 DIAGNOSIS — R918 Other nonspecific abnormal finding of lung field: Secondary | ICD-10-CM | POA: Diagnosis not present

## 2019-11-13 ENCOUNTER — Other Ambulatory Visit: Payer: Self-pay

## 2019-11-13 DIAGNOSIS — R911 Solitary pulmonary nodule: Secondary | ICD-10-CM

## 2019-11-23 ENCOUNTER — Telehealth: Payer: Self-pay | Admitting: Pulmonary Disease

## 2019-11-23 DIAGNOSIS — R911 Solitary pulmonary nodule: Secondary | ICD-10-CM

## 2019-11-23 NOTE — Telephone Encounter (Signed)
Spoke with Margaret White, dropped disk off earlier with CT images on it for Dr. Valeta Harms to review.  This has been placed in Dr. Juline Patch box per [place] documentation below.    Margaret White wants to know what Dr. Valeta Harms thinks of the images, and when she needs to have a follow-up CT scheduled.  Margaret White would like imaging scheduled in [place] from here on out to avoid having to bring imaging disks in to the office.    Lastly, Margaret White wants clarification on if she needs a second flu vaccine.  Margaret White had one 07/2019.  I advised Margaret White that she would only need 1 vaccine per season.  Margaret White states that Dr. Valeta Harms had told her to get a flu shot after reviewing the impression of her most recent CT, so she would like clarification from him regarding this.  Dr. Valeta Harms please advise.  Thanks!

## 2019-11-23 NOTE — Telephone Encounter (Signed)
Will route to [facility] for follow up.

## 2019-11-23 NOTE — Telephone Encounter (Signed)
Patient states does she need to get flu shot.  Patient states got flu shot October 2020. Patient phone number is [phone].

## 2019-11-24 NOTE — Telephone Encounter (Signed)
PCCM:  I will review CT images next week once in the office. Please place in "to do" box on desk  Thanks Garner Nash, [facility] 11/24/2019 3:55 PM

## 2019-11-25 NOTE — Telephone Encounter (Signed)
Disc located up front and taken to Dr Juline Patch desk and placed in "to do" box Will route to both Dr Valeta Harms and Daneil Dan to ensure follow up

## 2019-12-02 NOTE — Telephone Encounter (Signed)
PCCM:  Images reviewed. Right lower lobe ground glass opacity which can represent many different things, usually benign inflammation however we need to make sure this dis appears.   Please order a repeat NON-contrasted CT Chest to be completed mid/end of march approximately 6-8weeks from initial scan. Please schedule follow up appt with me following her CT scan results.   Thanks  Garner Nash, DO [facility] 12/02/2019 7:09 AM

## 2019-12-02 NOTE — Addendum Note (Signed)
Addended by: Elton Sin on: 12/02/2019 09:40 AM   Modules accepted: Orders

## 2019-12-02 NOTE — Telephone Encounter (Signed)
New CT chest w/out contrast ordered for mid to late March. Called and spoke with Patient.  Dr.Icard's results and recommendations given.   Patient stated understanding. Nothing further at this time.

## 2019-12-04 ENCOUNTER — Ambulatory Visit: Payer: Medicare Other | Admitting: Adult Health

## 2019-12-28 ENCOUNTER — Other Ambulatory Visit: Payer: Medicare Other

## 2020-01-01 ENCOUNTER — Ambulatory Visit
Admission: RE | Admit: 2020-01-01 | Discharge: 2020-01-01 | Disposition: A | Discharge status: Home / Self Care | Payer: Medicare Other | Source: Ambulatory Visit | Attending: Pulmonary Disease | Admitting: Pulmonary Disease

## 2020-01-01 ENCOUNTER — Other Ambulatory Visit: Payer: Self-pay

## 2020-01-01 DIAGNOSIS — J984 Other disorders of lung: Secondary | ICD-10-CM | POA: Diagnosis not present

## 2020-01-01 DIAGNOSIS — R911 Solitary pulmonary nodule: Secondary | ICD-10-CM

## 2020-01-05 ENCOUNTER — Other Ambulatory Visit: Payer: Self-pay

## 2020-01-05 ENCOUNTER — Ambulatory Visit (INDEPENDENT_AMBULATORY_CARE_PROVIDER_SITE_OTHER): Payer: Medicare Other

## 2020-01-05 DIAGNOSIS — Z0000 Encounter for general adult medical examination without abnormal findings: Secondary | ICD-10-CM | POA: Diagnosis not present

## 2020-01-05 NOTE — Progress Notes (Signed)
This visit is being conducted via phone call due to the COVID-19 pandemic. This patient has given me verbal consent via phone to conduct this visit, patient states they are participating from their home address. Some vital signs may be absent or patient reported.   Patient identification: identified by name, DOB, and current address.  Location provider: [facility], Office Persons participating in the virtual visit: Denman George LPN, patient, and Dr. Garret Reddish    Subjective:   Margaret White is a 69 y.o. female who presents for an Initial Medicare Annual Wellness Visit.  Review of Systems     Cardiac Risk Factors include: advanced age (>76mn, >>84women);hypertension    Objective:    Today's Vitals   01/05/20 1301  PainSc: 3    There is no height or weight on file to calculate BMI.  Advanced Directives 01/05/2020 03/24/2019 03/02/2019 08/08/2018 04/11/2018 04/11/2018 04/01/2018  Does Patient Have a Medical Advance Directive? No No No No No No No  Would patient like information on creating a medical advance directive? Yes (MAU/Ambulatory/Procedural Areas - Information given) No - Patient declined - Yes (ED - Information included in AVS) No - Patient declined No - Patient declined Yes (MAU/Ambulatory/Procedural Areas - Information given)    Current Medications (verified) Outpatient Encounter Medications as of 01/05/2020  Medication Sig  . amLODipine (NORVASC) 5 MG tablet TAKE 1 TABLET DAILY  . DULoxetine (CYMBALTA) 30 MG capsule Take 1 capsule (30 mg total) by mouth daily.  .Marland Kitchenesomeprazole (NEXIUM) 40 MG capsule Take 1 capsule (40 mg total) by mouth 2 (two) times daily before a meal.  . FLUoxetine (PROZAC) 20 MG tablet TAKE 1 TABLET DAILY  . fluticasone (FLONASE) 50 MCG/ACT nasal spray USE 1 SPRAY IN EACH NOSTRIL DAILY AS NEEDED FOR ALLERGIES  . gabapentin (NEURONTIN) 300 MG capsule Take 1 capsule (300 mg total) by mouth 3 (three) times daily.   No facility-administered encounter  medications on file as of 01/05/2020.    Allergies (verified) Codeine and Sulfa antibiotics   History: Past Medical History:  Diagnosis Date  . Allergic rhinitis   . Anxiety   . Avascular necrosis of hip, right ([facility] 03/24/2018  . Chronic cluster headache   . Chronic sinusitis   . Diabetes mellitus type 2, diet-controlled ([place]    per pt currently no taking metformin, followed by pcp  . Eczema   . Essential hypertension    cardiologsit-- dr dr mBettina Gavia . Fibromyalgia   . GERD (gastroesophageal reflux disease)   . Hepatic steatosis 10/14/2017  . Hiatal hernia   . 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 History of colon polyps   . History of hyperthyroidism 2005  . History of pneumonia, recurrent   . History of seizure 2006   x1  . IBS (irritable bowel syndrome)   . Mixed hyperlipidemia   . Nodule of lower lobe of left lung 10/2018   542m   pulmologist-- dr i.Meda CoffeebrLeory Plowmanstable per lov note in epic  . Ocular rosacea   . OSA (obstructive sleep apnea)    per pt intolerant to cpap due to sinus issues,  uses mouth guard   . [place]itis pubis ([place]1/29/[place]   [facility] PACs   . Primary osteoarthritis of both hands 07/10/2017  . Recurrent major depressive disorder, in full remission ([place]) 06/26/2017  . RLS (restless legs syndrome)   . Seasonal allergies   . Solitary pulmonary nodule    pulmologist-- dr i. Leory Plowman , lov note in epic,  stable  . 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 Shallow disc bulging without significant neuroforaminal stenosis.  Marland Kitchen SUI (stress urinary incontinence, female)    . Vitamin B 12 deficiency   . Vitamin D deficiency    Past Surgical History:  Procedure Laterality Date  . ABDOMINOPLASTY  2006  . ANAL FISSURE REPAIR  2006  . BLADDER SURGERY  1984  . BREAST BIOPSY Right     benign    . CARPAL TUNNEL RELEASE Bilateral 1989  . CATARACT EXTRACTION W/ INTRAOCULAR LENS  IMPLANT, BILATERAL  2017  . COLONOSCOPY W/ POLYPECTOMY  2017  . [place]  . EYE SURGERY  child    x4  1957-1960  . INCONTINENCE SURGERY    . NASAL SEPTUM SURGERY  1987  . PUBOVAGINAL SLING N/A 03/24/2019   Procedure: CYSTOSCOPY  Gaynelle Arabian;  Surgeon: Bjorn Loser, MD;  Location: [facility];  Service: Urology;  Laterality: N/A;  . RHINOPLASTY  2007  . TOTAL ABDOMINAL HYSTERECTOMY W/ BILATERAL SALPINGOOPHORECTOMY  1984   w/ Marshall-Marchetti (bladder tack suspension)  . TOTAL HIP ARTHROPLASTY Right 04/11/2018   Procedure: RIGHT TOTAL HIP ARTHROPLASTY ANTERIOR APPROACH;  Surgeon: Mcarthur Rossetti, MD;  Location: [facility];  Service: Orthopedics;  Laterality: Right;  . ULNAR NERVE TRANSPOSITION Left 1990s   Family History  Problem Relation Age of Onset  . Hypertension Mother   . Cancer Father   . Heart disease Father   . Colon cancer Neg Hx   . Esophageal cancer Neg Hx   . Rectal cancer Neg Hx   . Stomach cancer Neg Hx    Social History   Socioeconomic History  . Marital status: Married    Spouse name: Not on file  . Number of children: Not on file  . Years of education: Not on file  . Highest education level: Not on file  Occupational History  . Occupation: Product manager: [employer]  Tobacco Use  . Smoking status: Never Smoker  . Smokeless tobacco: Never Used  Substance and Sexual Activity  . Alcohol use: No  . Drug use: Never  . Sexual activity: Yes    Birth control/protection: Surgical  Other Topics Concern  . Not on file  Social History Narrative  . Not on file   Social Determinants of  Health   Financial Resource Strain:   . Difficulty of Paying Living Expenses:   Food Insecurity:   . Worried About Charity fundraiser in the Last Year:   . Arboriculturist in the Last Year:   Transportation Needs:   . Film/video editor (Medical):   Marland Kitchen Lack of Transportation (Non-Medical):   Physical Activity:   . Days of Exercise per Week:   . Minutes of Exercise per Session:   Stress:   . Feeling of Stress :   Social Connections:   . Frequency of Communication with Friends and Family:   . Frequency of Social Gatherings with Friends and Family:   . Attends Religious Services:   . Active  Member of Clubs or Organizations:   . Attends Archivist Meetings:   Marland Kitchen Marital Status:     Tobacco Counseling Counseling given: Not Answered   Clinical Intake:  Pre-visit preparation completed: Yes  Pain : 0-10 Pain Score: 3  Pain Type: Acute pain Pain Location: Hand Pain Orientation: Left, Right Pain Descriptors / Indicators: Aching, Shooting Pain Onset: More than a month ago Pain Frequency: Intermittent  Diabetes: No  How often do you need to have someone help you when you read instructions, pamphlets, or other written materials from your doctor or pharmacy?: 1 - Never  Interpreter Needed?: No  Information entered by :: Denman George LPN   Activities of Daily Living In your present state of health, do you have any difficulty performing the following activities: 01/05/2020 03/24/2019  Hearing? N Y  Comment - a tad bit certain pitches  Vision? N N  Difficulty concentrating or making decisions? N N  Walking or climbing stairs? N N  Dressing or bathing? N N  Doing errands, shopping? N -  Preparing Food and eating ? N -  Using the Toilet? N -  In the past six months, have you accidently leaked urine? N -  Do you have problems with loss of bowel control? N -  Managing your Medications? N -  Managing your Finances? N -  Housekeeping or managing your  Housekeeping? N -  Some recent data might be hidden     Immunizations and Health Maintenance Immunization History  Administered Date(s) Administered  . Influenza,inj,Quad PF,6+ Mos 08/06/2017, 07/31/2018  . Influenza-Unspecified 07/23/2019  . Pneumococcal Conjugate-13 03/24/2018  . Pneumococcal Polysaccharide-23 08/19/2019  . Tdap 04/25/2018   There are no preventive care reminders to display for this patient.  Patient Care Team: Leamon Arnt, MD as PCP - General (Family Medicine) Lavonna Monarch, MD as Consulting Physician (Dermatology) Bjorn Loser, MD as Consulting Physician (Urology) [place], MD as Consulting Physician (Gastroenterology) Lynnell Dike, OD as Consulting Physician (Optometry)  Indicate any recent Medical Services you may have received from other than [facility] providers in the past year (date may be approximate).     Assessment:   This is a routine wellness examination for [place].  Hearing/Vision screen No exam data present  Dietary issues and exercise activities discussed: Current Exercise Habits: The patient has a physically strenuous job, but has no regular exercise apart from work.  Goals   None    Depression Screen PHQ 2/9 Scores 01/05/2020 04/28/2019 03/30/2019 12/09/2018 03/24/2018 11/19/2017 10/14/2017  PHQ - 2 Score 0 0 0 0 3 0 0  PHQ- 9 Score - 4 0 6 7 - 4    Fall Risk Fall Risk  01/05/2020 03/30/2019 01/21/2019 12/09/2018 03/24/2018  Falls in the past year? 0 0 0 0 Yes  Number falls in past yr: 0 0 0 0 -  Injury with Fall? 0 0 0 0 Yes  Follow up Falls evaluation completed;Education provided;Falls prevention discussed - - - -    Is the patient's home free of loose throw rugs in walkways, pet beds, electrical cords, etc?   yes      Grab bars in the bathroom? yes      Handrails on the stairs?   yes      Adequate lighting?   yes   Cognitive Function:   6CIT Screen 01/05/2020  What Year? 0 points  What month? 0 points  What time? 0  points  Count back from 20 0 points  Months in reverse 0 points  Repeat phrase 0 points  Total Score 0    Screening Tests Health Maintenance  Topic Date Due  . MAMMOGRAM  08/06/2020  . COLONOSCOPY  10/19/2025  . INFLUENZA VACCINE  Completed  . DEXA SCAN  Completed  . Hepatitis C Screening  Completed  . PNA vac Low Risk Adult  Completed  . TETANUS/TDAP  Discontinued    Qualifies for Shingles Vaccine? Discussed and patient will check with pharmacy for coverage.  Patient education handout provided   Cancer Screenings: Lung: Low Dose CT Chest recommended if Age 84-80 years, 30 pack-year currently smoking OR have quit w/in 15years. Patient does not qualify. Breast: Up to date on Mammogram? Yes   Up to date of Bone Density/Dexa? Yes Colorectal: colonoscopy 10/20/15    Plan:  I have personally reviewed and addressed the Medicare Annual Wellness questionnaire and have noted the following in the patient's chart:  A. Medical and social history B. Use of alcohol, tobacco or illicit drugs  C. Current medications and supplements D. Functional ability and status E.  Nutritional status F.  Physical activity G. Advance directives H. List of other physicians I.  Hospitalizations, surgeries, and ER visits in previous 12 months J.  [place] such as hearing and vision if needed, cognitive and depression L. Referrals, records requested, and appointments- none   In addition, I have reviewed and discussed with patient certain preventive protocols, quality metrics, and best practice recommendations. A written personalized care plan for preventive services as well as general preventive health recommendations were provided to patient.   Signed,  Denman George, LPN  Nurse Health Advisor   Nurse Notes: Patient has concerns with pain in both hands near thumbs and radiating to wrists.  Also possible cyst on left hand.  History of bilateral carpal tunnel release.  States that she  thinks that it is arthritis.  Please advise.

## 2020-01-05 NOTE — Patient Instructions (Signed)
Margaret White , Thank you for taking time to come for your Medicare Wellness Visit. I appreciate your ongoing commitment to your health goals. Please review the following plan we discussed and let me know if I can assist you in the future.   Screening recommendations/referrals: Colorectal Screening: up to date; last colonoscopy 1/12/174  Mammogram: up to date; last 08/07/19 Bone Density: up to date; last 10/25/17   Vision and Dental Exams: Recommended annual ophthalmology exams for early detection of glaucoma and other disorders of the eye Recommended annual dental exams for proper oral hygiene  Vaccinations: Influenza vaccine: completed 07/23/19 Pneumococcal vaccine: up to date; last 08/19/19 Tdap vaccine: up to date; last 04/25/18 Shingles vaccine: You may receive this vaccine at your local pharmacy. (see handout)   Advanced directives: Advance directives discussed with you today. I have provided a copy for you to complete at home and have notarized. Once this is complete please bring a copy in to our office so we can scan it into your chart.  Goals: Recommend to drink at least 6-8 8oz glasses of water per day and consume a balanced diet rich in fresh fruits and vegetables.   Concerns:  I will be in touch with advice regarding your concerns with hand pain.   Next appointment: Please schedule your Annual Wellness Visit with your Nurse Health Advisor in one year.  Preventive Care 60 Years and Older, Female Preventive care refers to lifestyle choices and visits with your health care provider that can promote health and wellness. What does preventive care include?  A yearly physical exam. This is also called an annual well check.  Dental exams once or twice a year.  Routine eye exams. Ask your health care provider how often you should have your eyes checked.  Personal lifestyle choices, including:  Daily care of your teeth and gums.  Regular physical activity.  Eating a healthy  diet.  Avoiding tobacco and drug use.  Limiting alcohol use.  Practicing safe sex.  Taking low-dose aspirin every day if recommended by your health care provider.  Taking vitamin and mineral supplements as recommended by your health care provider. What happens during an annual well check? The services and screenings done by your health care provider during your annual well check will depend on your age, overall health, lifestyle risk factors, and family history of disease. Counseling  Your health care provider may ask you questions about your:  Alcohol use.  Tobacco use.  Drug use.  Emotional well-being.  Home and relationship well-being.  Sexual activity.  Eating habits.  History of falls.  Memory and ability to understand (cognition).  Work and work Statistician.  Reproductive health. Screening  You may have the following tests or measurements:  Height, weight, and BMI.  Blood pressure.  Lipid and cholesterol levels. These may be checked every 5 years, or more frequently if you are over 36 years old.  Skin check.  Lung cancer screening. You may have this screening every year starting at age 68 if you have a 30-pack-year history of smoking and currently smoke or have quit within the past 15 years.  Fecal occult blood test (FOBT) of the stool. You may have this test every year starting at age 68.  Flexible sigmoidoscopy or colonoscopy. You may have a sigmoidoscopy every 5 years or a colonoscopy every 10 years starting at age 43.  Hepatitis C blood test.  Hepatitis B blood test.  Sexually transmitted disease (STD) testing.  Diabetes screening. This is  done by checking your blood sugar (glucose) after you have not eaten for a while (fasting). You may have this done every 1-3 years.  Bone density scan. This is done to screen for osteoporosis. You may have this done starting at age 44.  Mammogram. This may be done every 1-2 years. Talk to your health care  provider about how often you should have regular mammograms. Talk with your health care provider about your test results, treatment options, and if necessary, the need for more tests. Vaccines  Your health care provider may recommend certain vaccines, such as:  Influenza vaccine. This is recommended every year.  Tetanus, diphtheria, and acellular pertussis (Tdap, Td) vaccine. You may need a Td booster every 10 years.  Zoster vaccine. You may need this after age 41.  Pneumococcal 13-valent conjugate (PCV13) vaccine. One dose is recommended after age 45.  Pneumococcal polysaccharide (PPSV23) vaccine. One dose is recommended after age 63. Talk to your health care provider about which screenings and vaccines you need and how often you need them. This information is not intended to replace advice given to you by your health care provider. Make sure you discuss any questions you have with your health care provider. Document Released: 10/21/2015 Document Revised: 06/13/2016 Document Reviewed: 07/26/2015 Elsevier Interactive Patient Education  2017 [place] Prevention in the Home Falls can cause injuries. They can happen to people of all ages. There are many things you can do to make your home safe and to help prevent falls. What can I do on the outside of my home?  Regularly fix the edges of walkways and driveways and fix any cracks.  Remove anything that might make you trip as you walk through a door, such as a raised step or threshold.  Trim any bushes or trees on the path to your home.  Use bright outdoor lighting.  Clear any walking paths of anything that might make someone trip, such as rocks or tools.  Regularly check to see if handrails are loose or broken. Make sure that both sides of any steps have handrails.  Any raised decks and porches should have guardrails on the edges.  Have any leaves, snow, or ice cleared regularly.  Use sand or salt on walking paths  during winter.  Clean up any spills in your garage right away. This includes oil or grease spills. What can I do in the bathroom?  Use night lights.  Install grab bars by the toilet and in the tub and shower. Do not use towel bars as grab bars.  Use non-skid mats or decals in the tub or shower.  If you need to sit down in the shower, use a plastic, non-slip stool.  Keep the floor dry. Clean up any water that spills on the floor as soon as it happens.  Remove soap buildup in the tub or shower regularly.  Attach bath mats securely with double-sided non-slip rug tape.  Do not have throw rugs and other things on the floor that can make you trip. What can I do in the bedroom?  Use night lights.  Make sure that you have a light by your bed that is easy to reach.  Do not use any sheets or blankets that are too big for your bed. They should not hang down onto the floor.  Have a firm chair that has side arms. You can use this for support while you get dressed.  Do not have throw rugs and other things  on the floor that can make you trip. What can I do in the kitchen?  Clean up any spills right away.  Avoid walking on wet floors.  Keep items that you use a lot in easy-to-reach places.  If you need to reach something above you, use a strong step stool that has a grab bar.  Keep electrical cords out of the way.  Do not use floor polish or wax that makes floors slippery. If you must use wax, use non-skid floor wax.  Do not have throw rugs and other things on the floor that can make you trip. What can I do with my stairs?  Do not leave any items on the stairs.  Make sure that there are handrails on both sides of the stairs and use them. Fix handrails that are broken or loose. Make sure that handrails are as long as the stairways.  Check any carpeting to make sure that it is firmly attached to the stairs. Fix any carpet that is loose or worn.  Avoid having throw rugs at the top  or bottom of the stairs. If you do have throw rugs, attach them to the floor with carpet tape.  Make sure that you have a light switch at the top of the stairs and the bottom of the stairs. If you do not have them, ask someone to add them for you. What else can I do to help prevent falls?  Wear shoes that:  Do not have high heels.  Have rubber bottoms.  Are comfortable and fit you well.  Are closed at the toe. Do not wear sandals.  If you use a stepladder:  Make sure that it is fully opened. Do not climb a closed stepladder.  Make sure that both sides of the stepladder are locked into place.  Ask someone to hold it for you, if possible.  Clearly mark and make sure that you can see:  Any grab bars or handrails.  First and last steps.  Where the edge of each step is.  Use tools that help you move around (mobility aids) if they are needed. These include:  Canes.  Walkers.  Scooters.  Crutches.  Turn on the lights when you go into a dark area. Replace any light bulbs as soon as they burn out.  Set up your furniture so you have a clear path. Avoid moving your furniture around.  If any of your floors are uneven, fix them.  If there are any pets around you, be aware of where they are.  Review your medicines with your doctor. Some medicines can make you feel dizzy. This can increase your chance of falling. Ask your doctor what other things that you can do to help prevent falls. This information is not intended to replace advice given to you by your health care provider. Make sure you discuss any questions you have with your health care provider. Document Released: 07/21/2009 Document Revised: 03/01/2016 Document Reviewed: 10/29/2014 Elsevier Interactive Patient Education  2017 [employer].

## 2020-01-23 ENCOUNTER — Other Ambulatory Visit: Payer: Self-pay | Admitting: Gastroenterology

## 2020-01-23 DIAGNOSIS — K219 Gastro-esophageal reflux disease without esophagitis: Secondary | ICD-10-CM

## 2020-01-25 NOTE — Progress Notes (Signed)
Patient identification verified. Results of recent CT reviewed. Per Dr. Valeta Harms, the right lower lobe area we were following has resolved. Patient verbalized understanding of results.

## 2020-02-08 ENCOUNTER — Encounter: Payer: Self-pay | Admitting: Family Medicine

## 2020-02-08 ENCOUNTER — Ambulatory Visit (INDEPENDENT_AMBULATORY_CARE_PROVIDER_SITE_OTHER): Payer: Medicare Other | Admitting: Family Medicine

## 2020-02-08 ENCOUNTER — Other Ambulatory Visit: Payer: Self-pay

## 2020-02-08 VITALS — BP 118/70 | HR 91 | Temp 98.4°F | Resp 18 | Ht 64.0 in | Wt 182.6 lb

## 2020-02-08 DIAGNOSIS — I10 Essential (primary) hypertension: Secondary | ICD-10-CM | POA: Diagnosis not present

## 2020-02-08 DIAGNOSIS — E8881 Metabolic syndrome: Secondary | ICD-10-CM

## 2020-02-08 DIAGNOSIS — R7301 Impaired fasting glucose: Secondary | ICD-10-CM | POA: Diagnosis not present

## 2020-02-08 DIAGNOSIS — E538 Deficiency of other specified B group vitamins: Secondary | ICD-10-CM

## 2020-02-08 DIAGNOSIS — M797 Fibromyalgia: Secondary | ICD-10-CM

## 2020-02-08 DIAGNOSIS — K580 Irritable bowel syndrome with diarrhea: Secondary | ICD-10-CM | POA: Diagnosis not present

## 2020-02-08 DIAGNOSIS — E88819 Insulin resistance, unspecified: Secondary | ICD-10-CM

## 2020-02-08 DIAGNOSIS — E782 Mixed hyperlipidemia: Secondary | ICD-10-CM

## 2020-02-08 DIAGNOSIS — F3342 Major depressive disorder, recurrent, in full remission: Secondary | ICD-10-CM

## 2020-02-08 DIAGNOSIS — K760 Fatty (change of) liver, not elsewhere classified: Secondary | ICD-10-CM

## 2020-02-08 MED ORDER — FLUOXETINE HCL 10 MG PO TABS: 10.0000 mg | ORAL_TABLET | Freq: Every day | ORAL | 3 refills | Status: DC

## 2020-02-08 NOTE — Patient Instructions (Signed)
Please return in 6 months for hypertension follow up and recheck fibromyalgia  I will release your lab results to you on your MyChart account with further instructions. Please reply with any questions.   I'm so glad you are feeling better! Try decreasing the prozac to 63m daily. I have refilled this lower dose through [facility].   Let me know if you need an antibiotic. But start zyrtec to help.    If you have any questions or concerns, please don't hesitate to send me a message via MyChart or call the office at [phone] Thank you for visiting with [facility]! It's our pleasure caring for you.

## 2020-02-09 ENCOUNTER — Encounter: Payer: Self-pay | Admitting: Family Medicine

## 2020-02-09 LAB — CBC WITH DIFFERENTIAL/PLATELET
Basophils Absolute: 0 10*3/uL (ref 0.0–0.1)
Basophils Relative: 0.4 % (ref 0.0–3.0)
Eosinophils Absolute: 0.3 10*3/uL (ref 0.0–0.7)
Eosinophils Relative: 3.7 % (ref 0.0–5.0)
HCT: 43.5 % (ref 36.0–46.0)
Hemoglobin: 14.3 g/dL (ref 12.0–15.0)
Lymphocytes Relative: 38.7 % (ref 12.0–46.0)
Lymphs Abs: 2.7 10*3/uL (ref 0.7–4.0)
MCHC: 32.9 g/dL (ref 30.0–36.0)
MCV: 87.4 fl (ref 78.0–100.0)
Monocytes Absolute: 0.9 10*3/uL (ref 0.1–1.0)
Monocytes Relative: 13.3 % — ABNORMAL HIGH (ref 3.0–12.0)
Neutro Abs: 3.1 10*3/uL (ref 1.4–7.7)
Neutrophils Relative %: 43.9 % (ref 43.0–77.0)
Platelets: 285 10*3/uL (ref 150.0–400.0)
RBC: 4.98 Mil/uL (ref 3.87–5.11)
RDW: 16 % — ABNORMAL HIGH (ref 11.5–15.5)
WBC: 7 10*3/uL (ref 4.0–10.5)

## 2020-02-09 LAB — COMPREHENSIVE METABOLIC PANEL
ALT: 127 U/L — ABNORMAL HIGH (ref 0–35)
AST: 101 U/L — ABNORMAL HIGH (ref 0–37)
Albumin: 4.2 g/dL (ref 3.5–5.2)
Alkaline Phosphatase: 97 U/L (ref 39–117)
BUN: 21 mg/dL (ref 6–23)
CO2: 33 mEq/L — ABNORMAL HIGH (ref 19–32)
Calcium: 9.6 mg/dL (ref 8.4–10.5)
Chloride: 97 mEq/L (ref 96–112)
Creatinine, Ser: 1.03 mg/dL (ref 0.40–1.20)
GFR: 53.38 mL/min — ABNORMAL LOW (ref >60.00)
Glucose, Bld: 97 mg/dL (ref 70–99)
Potassium: 3.5 mEq/L (ref 3.5–5.1)
Sodium: 138 mEq/L (ref 135–145)
Total Bilirubin: 0.5 mg/dL (ref 0.2–1.2)
Total Protein: 7 g/dL (ref 6.0–8.3)

## 2020-02-09 LAB — LIPID PANEL
Cholesterol: 190 mg/dL (ref 0–200)
HDL: 49.3 mg/dL (ref >39.00)
LDL Cholesterol: 104 mg/dL — ABNORMAL HIGH (ref 0–99)
NonHDL: 140.79
Total CHOL/HDL Ratio: 4
Triglycerides: 185 mg/dL — ABNORMAL HIGH (ref 0.0–149.0)
VLDL: 37 mg/dL (ref 0.0–40.0)

## 2020-02-09 LAB — TSH: TSH: 2.71 u[IU]/mL (ref 0.35–4.50)

## 2020-02-09 LAB — VITAMIN B12: Vitamin B-12: 976 pg/mL — ABNORMAL HIGH (ref 211–911)

## 2020-02-09 LAB — HEMOGLOBIN A1C: Hgb A1c MFr Bld: 6.4 % (ref 4.6–6.5)

## 2020-02-09 NOTE — Progress Notes (Signed)
Subjective  Chief Complaint  Patient presents with  . Annual Exam  . Ear Itching    Patient mentioned that due to her sinuses bothering her she has been having alot of itching to her ears.   . Eczema    Patient mentioned that she has some dry patches on skin but concerns for a wart on the right side of her neck.     HPI: Margaret White is a 69 y.o. female who presents to [facility] today for a Female Wellness Visit. She also has the concerns and/or needs as listed above in the chief complaint. These will be addressed in addition to the Health Maintenance Visit.   Wellness Visit: annual visit with health maintenance review and exam without Pap   HM: mammo - has f/u for mild irregularity noted. No need for pap. CRC screens up to date. Dexa: last was normal, recheck next year (year 3). Eating better. Working fulltime and keeps busy Chronic disease f/u and/or acute problem visit: (deemed necessary to be done in addition to the wellness visit):  Fibromyalgia f/u: last visit she was pretty miserable due to pain and poor sleep. We started gabapentin and cymbalta. She has had an excellent positive response. Feeling MUCH better. Sleep is now good! Less pain. Happier. Less stressed. No AEs.   Depression on long term prozac: mood is good.   HLD and h/o IR: recheck today on statin. nonfasting state  IBS w/ diarrhea: also improved since starting cymbalta.   H/o vit B12 deficiency due for recheck.   C/o sinus problems. Has h/o chronic and recurrent sinusitis. Has allergies now on flonase. No sinus pain or fevers  Assessment  1. Fibromyalgia   2. Essential hypertension   3. Mixed hyperlipidemia   4. Insulin resistance   5. Hepatic steatosis   6. Impaired fasting glucose    7. Irritable bowel syndrome with diarrhea   8. Vitamin B12 deficiency   9. Recurrent major depressive disorder, in full remission [facility]) Chronic     Plan  Female Wellness Visit:  Age  appropriate Health Maintenance and Prevention measures were discussed with patient. Included topics are cancer screening recommendations, ways to keep healthy (see AVS) including dietary and exercise recommendations, regular eye and dental care, use of seat belts, and avoidance of moderate alcohol use and tobacco use. Screens are up to date  BMI: discussed patient's BMI and encouraged positive lifestyle modifications to help get to or maintain a target BMI. improving  HM needs and immunizations were addressed and ordered. See below for orders. See HM and immunization section for updates. covid eligible  Routine labs and screening tests ordered including cmp, cbc and lipids where appropriate.  Discussed recommendations regarding Vit D and calcium supplementation (see AVS)  Chronic disease management visit and/or acute problem visit:  Fibromyalgia: much improved! Further education provided. Should continue to improve although will have flares. Continue cymbalta and gabapentin at current dosages.   HTN is controlled.   Recheck lipids  Recheck glucose, non fasting  Depression: stable. Will try to slowly wean off prozac if able since now on cymbalta.  Check b12   AR and sinusitis: monitor for worsening sxs and signs of infection; will order abx if needed. Add zyrtec.   Follow up: Return in about 6 months (around 08/10/2020) for follow up on fibromyalgia, follow up Hypertension.  Orders Placed This Encounter  Procedures  . CBC with Differential/Platelet  . Comprehensive metabolic panel  . Hemoglobin  A1c  . Lipid panel  . TSH  . Vitamin B12   Meds ordered this encounter  Medications  . FLUoxetine (PROZAC) 10 MG tablet    Sig: Take 1 tablet (10 mg total) by mouth daily.    Dispense:  90 tablet    Refill:  3      Lifestyle: Body mass index is 31.34 kg/m. Wt Readings from Last 3 Encounters:  02/08/20 182 lb 9.6 oz (82.8 kg)  10/30/19 183 lb 3.2 oz (83.1 kg)  09/22/19 182 lb 2  oz (82.6 kg)    Patient Active Problem List   Diagnosis Date Noted  . Colon polyps 08/19/2019    Priority: High  . Statin intolerance 03/25/2018    Priority: High  . GERD (gastroesophageal reflux disease) 10/14/2017    Priority: High  . Hepatic steatosis 10/14/2017    Priority: High    Advanced hepatic steatosis by ultrasound   . OSA (obstructive sleep apnea) 10/14/2017    Priority: High  . Fibromyalgia 10/14/2017    Priority: High  . Obesity (BMI 30-39.9) 10/14/2017    Priority: High  . Mixed hyperlipidemia 10/14/2017    Priority: High  . Essential hypertension 06/26/2017    Priority: High  . Incontinence without sensory awareness 05/08/2018    Priority: Medium    Followed by [facility]    . Mixed incontinence 05/08/2018    Priority: Medium    Followed by [facility]    . 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    Priority: Medium    Bilateral right worse than left that is mild in nature X-rays obtained in [place] on 07/12/2017 that are available through the [facility] PACS system   . IBS (irritable bowel syndrome) 10/14/2017    Priority: Medium  . Recurrent major depressive disorder, in full remission ([place]) 06/26/2017    Priority: Medium    Last Assessment & Plan:  Stable on medications  No suicidal ideation  Continue prozac   . 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    Priority: Low  . Allergic rhinitis 10/14/2017    Priority: Low  . Chronic maxillary sinusitis 10/14/2017    Priority: Low  . Vitamin B12 deficiency 10/14/2017    Priority: Low  . Vitamin D deficiency 10/14/2017    Priority: Low  . Female cystocele 10/14/2017    Priority: Low  . Chronic cough 12/09/2018  . 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 Spondylosis of lumbar region without myelopathy or radiculopathy 11/04/2017    MRI 11/01/2017: Severe L4-5 facet arthrosis on the right greater than left. Shallow disc bulging without significant neuroforaminal  stenosis.   Marland Kitchen History of cluster headache 10/14/2017  . Pelvic pain 10/14/2017  . Primary osteoarthritis of both hands 07/10/2017    Last Assessment & Plan:  Try mobic  Stay hydrated  Monitor renal panel    Health Maintenance  Topic Date Due  . COVID-19 Vaccine (1) Never done  . INFLUENZA VACCINE  05/08/2020  . MAMMOGRAM  08/06/2020  . DEXA SCAN  10/25/2020  . COLONOSCOPY  10/19/2025  . Hepatitis C Screening  Completed  . PNA vac Low Risk Adult  Completed  . TETANUS/TDAP  Discontinued   Immunization History  Administered Date(s) Administered  . Influenza,inj,Quad PF,6+ Mos 08/06/2017, 07/31/2018  . Influenza-Unspecified 07/23/2019  . Pneumococcal Conjugate-13 03/24/2018  . Pneumococcal Polysaccharide-23 08/19/2019  . Tdap 04/25/2018   We updated and reviewed the patient's past history in detail and it is documented below.  Allergies: Patient is allergic to codeine and sulfa antibiotics. Past Medical History Patient  has a past medical history of Allergic rhinitis, Anxiety, Avascular necrosis of hip, right ([place]) (03/24/2018), Chronic cluster headache, Chronic sinusitis, Diabetes mellitus type 2, diet-controlled ([place]), Eczema, Essential hypertension, Fibromyalgia, GERD (gastroesophageal reflux disease), Hepatic steatosis (10/14/2017), Hiatal hernia, Hip osteoarthritis (11/05/2017), History of avascular necrosis of capital femoral epiphysis, History of colon polyps, History of hyperthyroidism (2005), History of pneumonia, recurrent, History of seizure (2006), IBS (irritable bowel syndrome), Mixed hyperlipidemia, Nodule of lower lobe of left lung (10/2018), Ocular rosacea, OSA (obstructive sleep apnea), 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) (06/26/2017), RLS (restless legs syndrome), Seasonal allergies, Solitary pulmonary nodule, Spondylosis of lumbar region without myelopathy or  radiculopathy (11/04/2017), SUI (stress urinary incontinence, female), Vitamin B 12 deficiency, and Vitamin D deficiency. Past Surgical History Patient  has a past surgical history that includes Rhinoplasty (2007); Carpal tunnel release (Bilateral, 1989); Total hip arthroplasty (Right, 04/11/2018); Colonoscopy w/ polypectomy (2017); Ulnar nerve transposition (Left, 1990s); Bladder surgery (1984); Nasal septum surgery (1987); Ectopic pregnancy surgery (1979); Eye surgery (child ); Cataract extraction w/ intraocular lens  implant, bilateral (2017); Anal fissure repair (2006); Abdominoplasty (2006); Total abdominal hysterectomy w/ bilateral salpingoophorectomy (1984); Pubovaginal sling (N/A, 03/24/2019); Incontinence surgery; and Breast biopsy (Right). Family History: Patient family history includes Cancer in her father; Heart disease in her father; Hypertension in her mother. Social History:  Patient  reports that she has never smoked. She has never used smokeless tobacco. She reports that she does not drink alcohol or use drugs.  Review of Systems: Constitutional: negative for fever or malaise Ophthalmic: negative for photophobia, double vision or loss of vision Cardiovascular: negative for chest pain, dyspnea on exertion, or new LE swelling Respiratory: negative for SOB or persistent cough Integumentary: negative for new or persistent rashes, no breast lumps Neurological: negative for TIA or stroke symptoms Psychiatric: negative for SI or delusions Allergic/Immunologic: negative for hives  Patient Care Team    Relationship Specialty Notifications Start End  Leamon Arnt, MD PCP - General Family Medicine  08/19/19   Lavonna Monarch, MD Consulting Physician Dermatology  05/05/18   Bjorn Loser, MD Consulting Physician Urology  05/08/18   Thornton Park, MD Consulting Physician Gastroenterology  08/19/19   Lynnell Dike, OD Consulting Physician Optometry  01/05/20     Objective  Vitals:  BP 118/70   Pulse 91   Temp 98.4 F (36.9 C) (Temporal)   Resp 18   Ht 5' 4"  (1.626 m)   Wt 182 lb 9.6 oz (82.8 kg)   SpO2 93%   BMI 31.34 kg/m  General:  Well developed, well nourished, no acute distress  Psych:  Alert and orientedx3,normal mood and affect, appears much brighter today HEENT:  Normocephalic, atraumatic, non-icteric sclera,  supple neck without adenopathy, mass or thyromegaly Cardiovascular:  Normal S1, S2, RRR without gallop, rub or murmur Respiratory:  Good breath sounds bilaterally, CTAB with normal respiratory effort Gastrointestinal: normal bowel sounds, soft, non-tender, no noted masses. No HSM MSK: no deformities, contusions. Joints are without erythema or swelling.  Skin:  Warm, no rashes or suspicious lesions noted Neurologic:    Mental status is normal. CN 2-11 are normal. Gross motor and sensory exams are normal. Normal gait. No tremor  Commons side effects, risks, benefits, and alternatives for medications and treatment plan prescribed today were discussed, and the patient expressed understanding of the given instructions.  Patient is instructed to call or message via MyChart if he/she has any questions or concerns regarding our treatment plan. No barriers to understanding were identified. We discussed Red Flag symptoms and signs in detail. Patient expressed understanding regarding what to do in case of urgent or emergency type symptoms.   Medication list was reconciled, printed and provided to the patient in AVS. Patient instructions and summary information was reviewed with the patient as documented in the AVS. This note was prepared with assistance of Dragon voice recognition software. Occasional wrong-word or sound-a-like substitutions may have occurred due to the inherent limitations of voice recognition software  This visit occurred during the SARS-CoV-2 public health emergency.  Safety protocols were in place, including screening questions prior to the  visit, additional usage of staff PPE, and extensive cleaning of exam room while observing appropriate contact time as indicated for disinfecting solutions.

## 2020-02-10 ENCOUNTER — Encounter: Payer: Self-pay | Admitting: Family Medicine

## 2020-02-15 ENCOUNTER — Other Ambulatory Visit: Payer: Medicare Other

## 2020-02-20 ENCOUNTER — Encounter: Payer: Self-pay | Admitting: Family Medicine

## 2020-02-23 ENCOUNTER — Other Ambulatory Visit: Payer: Self-pay | Admitting: Family Medicine

## 2020-02-23 ENCOUNTER — Other Ambulatory Visit: Payer: Self-pay

## 2020-02-23 ENCOUNTER — Ambulatory Visit
Admission: RE | Admit: 2020-02-23 | Discharge: 2020-02-23 | Disposition: A | Discharge status: Home / Self Care | Payer: Medicare Other | Source: Ambulatory Visit | Attending: Physician Assistant | Admitting: Physician Assistant

## 2020-02-23 DIAGNOSIS — N6310 Unspecified lump in the right breast, unspecified quadrant: Secondary | ICD-10-CM

## 2020-02-23 DIAGNOSIS — N6312 Unspecified lump in the right breast, upper inner quadrant: Secondary | ICD-10-CM | POA: Diagnosis not present

## 2020-02-23 DIAGNOSIS — N6311 Unspecified lump in the right breast, upper outer quadrant: Secondary | ICD-10-CM | POA: Diagnosis not present

## 2020-02-26 ENCOUNTER — Other Ambulatory Visit: Payer: Self-pay

## 2020-02-26 ENCOUNTER — Ambulatory Visit (INDEPENDENT_AMBULATORY_CARE_PROVIDER_SITE_OTHER): Payer: Medicare Other | Admitting: Family Medicine

## 2020-02-26 ENCOUNTER — Encounter: Payer: Self-pay | Admitting: Family Medicine

## 2020-02-26 VITALS — BP 122/80 | HR 85 | Temp 97.9°F | Resp 18 | Ht 64.0 in | Wt 186.2 lb

## 2020-02-26 DIAGNOSIS — M19042 Primary osteoarthritis, left hand: Secondary | ICD-10-CM | POA: Diagnosis not present

## 2020-02-26 DIAGNOSIS — L570 Actinic keratosis: Secondary | ICD-10-CM

## 2020-02-26 DIAGNOSIS — B078 Other viral warts: Secondary | ICD-10-CM

## 2020-02-26 DIAGNOSIS — M65312 Trigger thumb, left thumb: Secondary | ICD-10-CM

## 2020-02-26 NOTE — Progress Notes (Signed)
Subjective  CC:  Chief Complaint  Patient presents with  . Verrucous Vulgaris    One is located near her right ear on her neck and the other is her left thumb. No soreness at present.   . Health Maintenance    Covid vaccine. Not interested in receiving at this time.   . Hand Pain    thumb is triggering    HPI: Margaret White is a 68 y.o. female who presents to the office today to address the problems listed above in the chief complaint.  C/o warts that bother her. The one on her hand has been present for several years. She will pick at them and then they get irritated. One on right lateral neck as well. Has dry patches/flaky on hands/arms as well.  C/o OA in first joint. Swelling. Painful with mvt. At times needs to pull it to open it. No injury. Ongoing and worsening over the last several weeks to month. Knits.   Assessment  1. Trigger finger of left thumb   2. Other viral warts   3. Actinic keratoses   4. 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 S/p steroid injection. rec ice, thumb spica rest.   Warts and AK's standard post cyrotherapy instructions given.  OA: advil/tylenol prn  Follow up: Return for as scheduled.  05/11/2020  No orders of the defined types were placed in this encounter.  No orders of the defined types were placed in this encounter.     I reviewed the patients updated PMH, FH, and SocHx.    Patient Active Problem List   Diagnosis Date Noted  . Colon polyps 08/19/2019    Priority: High  . Statin intolerance 03/25/2018    Priority: High  . GERD (gastroesophageal reflux disease) 10/14/2017    Priority: High  . NASH (nonalcoholic steatohepatitis) 10/14/2017    Priority: High  . OSA (obstructive sleep apnea) 10/14/2017    Priority: High  . Fibromyalgia 10/14/2017    Priority: High  . Obesity (BMI 30-39.9) 10/14/2017    Priority: High  . Mixed hyperlipidemia 10/14/2017   Priority: High  . Essential hypertension 06/26/2017    Priority: High  . Incontinence without sensory awareness 05/08/2018    Priority: Medium  . Mixed incontinence 05/08/2018    Priority: Medium  . 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    Priority: Medium  . IBS (irritable bowel syndrome) 10/14/2017    Priority: Medium  . Recurrent major depressive disorder, in full remission ([place]) 06/26/2017    Priority: Medium  . 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    Priority: Low  . Allergic rhinitis 10/14/2017    Priority: Low  . Chronic maxillary sinusitis 10/14/2017    Priority: Low  . Vitamin B12 deficiency 10/14/2017    Priority: Low  . Vitamin D deficiency 10/14/2017    Priority: Low  . Female cystocele 10/14/2017    Priority: Low  . Chronic cough 12/09/2018  . 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 History of cluster headache 10/14/2017  . Pelvic pain 10/14/2017  . Primary osteoarthritis of both hands 07/10/2017   Current Meds  Medication Sig  . amLODipine (NORVASC) 5 MG tablet TAKE 1 TABLET DAILY  . DULoxetine (CYMBALTA) 30 MG capsule Take 1 capsule (30 mg total) by mouth daily.  Marland Kitchen esomeprazole (NEXIUM) 40 MG capsule  TAKE 1 CAPSULE TWICE A DAY BEFORE MEALS  . FLUoxetine (PROZAC) 10 MG tablet Take 1 tablet (10 mg total) by mouth daily.  . fluticasone (FLONASE) 50 MCG/ACT nasal spray USE 1 SPRAY IN EACH NOSTRIL DAILY AS NEEDED FOR ALLERGIES (Patient taking differently: Place 1 spray into both nostrils daily. )  . gabapentin (NEURONTIN) 300 MG capsule Take 1 capsule (300 mg total) by mouth 3 (three) times daily.    Allergies: Patient is allergic to codeine and sulfa antibiotics. Family History: Patient family history includes Cancer in her father; Heart disease in her father; Hypertension in her mother. Social History:  Patient  reports that she has never smoked.  She has never used smokeless tobacco. She reports that she does not drink alcohol or use drugs.  Review of Systems: Constitutional: Negative for fever malaise or anorexia Cardiovascular: negative for chest pain Respiratory: negative for SOB or persistent cough Gastrointestinal: negative for abdominal pain  Objective  Vitals: BP 122/80   Pulse 85   Temp 97.9 F (36.6 C) (Temporal)   Resp 18   Ht 5' 4"  (1.626 m)   Wt 186 lb 3.2 oz (84.5 kg)   SpO2 98%   BMI 31.96 kg/m  General: no acute distress , A&Ox3 Skin:  Warm, as noted below, wart on right lateral neck and hand, left. Two 75m - 410mAKs on left dorsal hand Left thumb ttp and swollen at 1st mcp and triggering thumb without locking.   Trigger Finger Steroid Injection Procedure Note  Indications: The patient has symptoms of trigger digit that causes pain and locking.   Pre-operative Diagnosis: right 1st Trigger Finger  Post-operative Diagnosis: same   Procedure Details  A Time Out was held and the above information confirmed. The skin was prepped with alcohol and cold spray was use for anesthesia. The symptomatic trigger finger/nodule was injected with 0.2511mf Kenalog 71m102m and 0.25ml47m1% lidocaine. No complications.   Cryotherapy Procedure Note  Pre-operative Diagnosis: Actinic keratoses and warts  Post-operative Diagnosis: Actinic keratoses and warts  Locations: wart lateral right neck; wart left webspace between 3rd and 4th fingers, AK x 2 on dorsal left hand  Indications: precancerous AKs, irritated warts  Anesthesia: none  Procedure Details   Patient informed of risks (permanent scarring, infection, light or dark discoloration, bleeding, infection, weakness, numbness and recurrence of the lesion) and benefits of the procedure and verbal informed consent obtained. Universal time out performed  The areas are treated with liquid nitrogen therapy, frozen until ice ball extended 2 mm beyond lesion, allowed to  thaw, and treated again. The patient tolerated procedure well.  The patient was instructed on post-op care, warned that there may be blister formation, redness and pain. Recommend OTC analgesia as needed for pain.  Condition: Stable  Complications: none.    Commons side effects, risks, benefits, and alternatives for medications and treatment plan prescribed today were discussed, and the patient expressed understanding of the given instructions. Patient is instructed to call or message via MyChart if he/she has any questions or concerns regarding our treatment plan. No barriers to understanding were identified. We discussed Red Flag symptoms and signs in detail. Patient expressed understanding regarding what to do in case of urgent or emergency type symptoms.   Medication list was reconciled, printed and provided to the patient in AVS. Patient instructions and summary information was reviewed with the patient as documented in the AVS. This note was prepared with assistance of Dragon voice recognition software. Occasional wrong-word  or sound-a-like substitutions may have occurred due to the inherent limitations of voice recognition software  This visit occurred during the SARS-CoV-2 public health emergency.  Safety protocols were in place, including screening questions prior to the visit, additional usage of staff PPE, and extensive cleaning of exam room while observing appropriate contact time as indicated for disinfecting solutions.

## 2020-02-26 NOTE — Patient Instructions (Signed)
Please follow up as scheduled for your next visit with me: 05/11/2020   If you have any questions or concerns, please don't hesitate to send me a message via MyChart or call the office at [phone]. Thank you for visiting with [place] today! It's our pleasure caring for you.   Cryosurgery for Skin Conditions Cryosurgery, also called cryotherapy, is the use of extremely cold liquid (liquid nitrogen) to freeze and remove abnormal or diseased tissue. Cryosurgery may be used to remove certain growths on the skin, such as:  Warts.  Skin sores that could turn into cancer (precancerous skin lesions or actinic keratoses).  Some skin cancers. Cryosurgery usually takes a few minutes, and it can be done in your health care provider's office. Tell a health care provider about:  Any allergies you have.  All medicines you are taking, including vitamins, herbs, eye drops, creams, and over-the-counter medicines.  Any problems you or family members have had with anesthetic medicines.  Any blood disorders you have.  Any surgeries you have had.  Any medical conditions you have.  Whether you are pregnant or may be pregnant. What are the risks? Generally, this is a safe procedure. However, problems may occur, including:  Infection.  Bleeding.  Scarring.  Changes in skin color (lighter or darker than normal skin tone).  Swelling.  Hair loss in the treated area.  Damage to nearby structures or organs, such as nerve damage and loss of feeling. This is rare. What happens before the procedure? No specific preparation is needed for this procedure. Your health care provider will describe the procedure and will discuss the benefits and risks of the procedure with you. What happens during the procedure?   Your procedure will be performed using one of the following methods: ? Your health care provider may apply a device (probe) to the skin. The probe has liquid nitrogen flowing through it to cool it  down. The probe will be applied to the skin until the skin is frozen and destroyed. ? Your health care provider may apply liquid nitrogen to the skin with a swab or by spraying it on the skin until the skin is frozen and destroyed.  The treated area may be covered with a bandage (dressing). These procedures may vary among health care providers and clinics. What can I expect after procedure? After your procedure, it is common to have redness, swelling, and a blister that forms over the treated area. The blister may contain a small amount of blood. You may also have some mild stinging or a burning sensation that will resolve. If a blister forms, it will break open on its own after about 2-4 weeks, leaving a scab. Then the treated area will heal. After healing, there is usually little or no scarring. Follow these instructions at home: Caring for the treated area   Follow instructions from your health care provider about how to take care of the treated area. If you have a dressing, make sure you: ? Wash your hands with soap and water for at least 20 seconds before and after you change your dressing. If soap and water are not available, use hand sanitizer. ? Change your dressing as told by your health care provider. ? Keep the dressing and the treated area clean and dry. If the dressing gets wet, change it right away. ? Clean the treated area with soap and water. ? Keep the area covered with a dressing until it heals, or for as long as told by your  health care provider.  Check the treated area every day for signs of infection. Check for: ? More redness, swelling, or pain. ? More fluid or blood. ? Warmth. ? Pus or a bad smell.  If a blister forms, do not pick at your blister or try to break it open. Doing this can cause infection and scarring.  Do not apply any medicine, cream, or lotion to the treated area unless directed by your health care provider. General instructions  Take  over-the-counter and prescription medicines only as told by your health care provider.  Do not use any products that contain nicotine or tobacco, such as cigarettes, e-cigarettes, and chewing tobacco. These can delay healing. If you need help quitting, ask your health care provider.  Do not take baths, swim, use a hot tub, hand-wash dishes, or otherwise soak the treated area until your health care provider approves. Ask your health care provider if you may take showers. You may only be allowed to take sponge baths.  Keep all follow-up visits as told by your health care provider. This is important. Contact a health care provider if:  You have more redness, swelling, or pain around the treated area.  You have more fluid or blood coming from the treated area.  The treated area feels warm to the touch.  You have pus or a bad smell coming from the treated area.  Your blister becomes large and painful. Get help right away if:  You have a fever and have redness spreading from the treated area. Summary  Cryosurgery, also called cryotherapy, is the use of extreme cold (liquid nitrogen) to freeze and remove abnormal growths or diseased tissue.  Cryosurgery usually takes a few minutes, and it can be done in your health care provider's office.  Generally, this is a safe procedure that requires no specific preparation beforehand.  There are two different methods for performing cryosurgery. One method involves using a device (probe) to freeze the growth, and the other method involves applying liquid nitrogen directly to the growth.  After treatment with cryotherapy, follow care instructions as provided by your health care provider. Watch for signs of infection. If a blister forms, do not pick at it or try to break it open. This information is not intended to replace advice given to you by your health care provider. Make sure you discuss any questions you have with your health care  provider. Document Revised: 05/13/2019 Document Reviewed: 05/13/2019 Elsevier Patient Education  [place].   Trigger Finger  Trigger finger, also called stenosing tenosynovitis,  is a condition that causes a finger to get stuck in a bent position. Each finger has a tendon, which is a tough, cord-like tissue that connects muscle to bone, and each tendon passes through a tunnel of tissue called a tendon sheath. To move your finger, your tendon needs to glide freely through the sheath. Trigger finger happens when the tendon or the sheath thickens, making it difficult to move your finger. Trigger finger can affect any finger or a thumb. It may affect more than one finger. Mild cases may clear up with rest and medicine. Severe cases require more treatment. What are the causes? Trigger finger is caused by a thickened finger tendon or tendon sheath. The cause of this thickening is not known. What increases the risk? The following factors may make you more likely to develop this condition:  Doing activities that require a strong grip.  Having rheumatoid arthritis, gout, or diabetes.  Being 40-60  years old.  Being female. What are the signs or symptoms? Symptoms of this condition include:  Pain when bending or straightening your finger.  Tenderness or swelling where your finger attaches to the palm of your hand.  A lump in the palm of your hand or on the inside of your finger.  Hearing a noise like a pop or a snap when you try to straighten your finger.  Feeling a catching or locking sensation when you try to straighten your finger.  Being unable to straighten your finger. How is this diagnosed? This condition is diagnosed based on your symptoms and a physical exam. How is this treated? This condition may be treated by:  Resting your finger and avoiding activities that make symptoms worse.  Wearing a finger splint to keep your finger extended.  Taking NSAIDs, such as  ibuprofen, to relieve pain and swelling.  Doing gentle exercises to stretch the finger as told by your health care provider.  Having medicine that reduces swelling and inflammation (steroids) injected into the tendon sheath. Injections may need to be repeated.  Having surgery to open the tendon sheath. This may be done if other treatments do not work and you cannot straighten your finger. You may need physical therapy after surgery. Follow these instructions at home: If you have a splint:  Wear the splint as told by your health care provider. Remove it only as told by your health care provider.  Loosen it if your fingers tingle, become numb, or turn cold and blue.  Keep it clean.  If the splint is not waterproof: ? Do not let it get wet. ? Cover it with a watertight covering when you take a bath or shower. Managing pain, stiffness, and swelling     If directed, apply heat to the affected area as often as told by your health care provider. Use the heat source that your health care provider recommends, such as a moist heat pack or a heating pad.  Place a towel between your skin and the heat source.  Leave the heat on for 20-30 minutes.  Remove the heat if your skin turns bright red. This is especially important if you are unable to feel pain, heat, or cold. You may have a greater risk of getting burned. If directed, put ice on the painful area. To do this:  If you have a removable splint, remove it as told by your health care provider.  Put ice in a plastic bag.  Place a towel between your skin and the bag or between your splint and the bag.  Leave the ice on for 20 minutes, 2-3 times a day.  Activity  Rest your finger as told by your health care provider. Avoid activities that make the pain worse.  Return to your normal activities as told by your health care provider. Ask your health care provider what activities are safe for you.  Do exercises as told by your health care  provider.  Ask your health care provider when it is safe to drive if you have a splint on your hand. General instructions  Take over-the-counter and prescription medicines only as told by your health care provider.  Keep all follow-up visits as told by your health care provider. This is important. Contact a health care provider if:  Your symptoms are not improving with home care. Summary  Trigger finger, also called stenosing tenosynovitis, causes your finger to get stuck in a bent position. This can make it difficult and  painful to straighten your finger.  This condition develops when a finger tendon or tendon sheath thickens.  Treatment may include resting your finger, wearing a splint, and taking medicines.  In severe cases, surgery to open the tendon sheath may be needed. This information is not intended to replace advice given to you by your health care provider. Make sure you discuss any questions you have with your health care provider. Document Revised: 02/09/2019 Document Reviewed: 02/09/2019 Elsevier Patient Education  [place].

## 2020-04-22 ENCOUNTER — Ambulatory Visit (INDEPENDENT_AMBULATORY_CARE_PROVIDER_SITE_OTHER): Payer: Medicare Other | Admitting: Family Medicine

## 2020-04-22 ENCOUNTER — Other Ambulatory Visit: Payer: Self-pay

## 2020-04-22 ENCOUNTER — Encounter: Payer: Self-pay | Admitting: Family Medicine

## 2020-04-22 VITALS — BP 122/86 | HR 84 | Temp 98.9°F | Resp 15 | Wt 181.4 lb

## 2020-04-22 DIAGNOSIS — M160 Bilateral primary osteoarthritis of hip: Secondary | ICD-10-CM

## 2020-04-22 DIAGNOSIS — R945 Abnormal results of liver function studies: Secondary | ICD-10-CM

## 2020-04-22 DIAGNOSIS — K7581 Nonalcoholic steatohepatitis (NASH): Secondary | ICD-10-CM | POA: Diagnosis not present

## 2020-04-22 DIAGNOSIS — M797 Fibromyalgia: Secondary | ICD-10-CM | POA: Diagnosis not present

## 2020-04-22 DIAGNOSIS — M47816 Spondylosis without myelopathy or radiculopathy, lumbar region: Secondary | ICD-10-CM | POA: Diagnosis not present

## 2020-04-22 DIAGNOSIS — E8881 Metabolic syndrome: Secondary | ICD-10-CM

## 2020-04-22 DIAGNOSIS — R7989 Other specified abnormal findings of blood chemistry: Secondary | ICD-10-CM

## 2020-04-22 LAB — COMPREHENSIVE METABOLIC PANEL WITH GFR
ALT: 125 U/L — ABNORMAL HIGH (ref 0–35)
AST: 101 U/L — ABNORMAL HIGH (ref 0–37)
Albumin: 4.5 g/dL (ref 3.5–5.2)
Alkaline Phosphatase: 114 U/L (ref 39–117)
BUN: 15 mg/dL (ref 6–23)
CO2: 32 meq/L (ref 19–32)
Calcium: 10.1 mg/dL (ref 8.4–10.5)
Chloride: 100 meq/L (ref 96–112)
Creatinine, Ser: 0.97 mg/dL (ref 0.40–1.20)
GFR: 57.17 mL/min — ABNORMAL LOW
Glucose, Bld: 113 mg/dL — ABNORMAL HIGH (ref 70–99)
Potassium: 4.8 meq/L (ref 3.5–5.1)
Sodium: 141 meq/L (ref 135–145)
Total Bilirubin: 0.8 mg/dL (ref 0.2–1.2)
Total Protein: 7.2 g/dL (ref 6.0–8.3)

## 2020-04-22 MED ORDER — DULOXETINE HCL 60 MG PO CPEP: 60.0000 mg | ORAL_CAPSULE | Freq: Every day | ORAL | 3 refills | Status: DC

## 2020-04-22 MED ORDER — GABAPENTIN 300 MG PO CAPS: ORAL_CAPSULE | ORAL | 3 refills | Status: DC

## 2020-04-22 MED ORDER — PREDNISONE 10 MG PO TABS: ORAL_TABLET | ORAL | 0 refills | Status: DC

## 2020-04-22 NOTE — Patient Instructions (Addendum)
Please return in 6 weeks for sugar recheck.   Increase your nighttime dose of gabapentin to 661m  I will let you know if you can increase your cymbalta dose Complete the 7 day taper of prednisone. Work on getting good sleep.  And here is information on a diabetic diet. I will recheck your levels at next visit   Diabetes Mellitus and Nutrition, Adult When you have diabetes (diabetes mellitus), it is very important to have healthy eating habits because your blood sugar (glucose) levels are greatly affected by what you eat and drink. Eating healthy foods in the appropriate amounts, at about the same times every day, can help you:  Control your blood glucose.  Lower your risk of heart disease.  Improve your blood pressure.  Reach or maintain a healthy weight. Every person with diabetes is different, and each person has different needs for a meal plan. Your health care provider may recommend that you work with a diet and nutrition specialist (dietitian) to make a meal plan that is best for you. Your meal plan may vary depending on factors such as:  The calories you need.  The medicines you take.  Your weight.  Your blood glucose, blood pressure, and cholesterol levels.  Your activity level.  Other health conditions you have, such as heart or kidney disease. How do carbohydrates affect me? Carbohydrates, also called carbs, affect your blood glucose level more than any other type of food. Eating carbs naturally raises the amount of glucose in your blood. Carb counting is a method for keeping track of how many carbs you eat. Counting carbs is important to keep your blood glucose at a healthy level, especially if you use insulin or take certain oral diabetes medicines. It is important to know how many carbs you can safely have in each meal. This is different for every person. Your dietitian can help you calculate how many carbs you should have at each meal and for each snack. Foods that  contain carbs include:  Bread, cereal, rice, pasta, and crackers.  Potatoes and corn.  Peas, beans, and lentils.  Milk and yogurt.  Fruit and juice.  Desserts, such as cakes, cookies, ice cream, and candy. How does alcohol affect me? Alcohol can cause a sudden decrease in blood glucose (hypoglycemia), especially if you use insulin or take certain oral diabetes medicines. Hypoglycemia can be a life-threatening condition. Symptoms of hypoglycemia (sleepiness, dizziness, and confusion) are similar to symptoms of having too much alcohol. If your health care provider says that alcohol is safe for you, follow these guidelines:  Limit alcohol intake to no more than 1 drink per day for nonpregnant women and 2 drinks per day for men. One drink equals 12 oz of beer, 5 oz of wine, or 1 oz of hard liquor.  Do not drink on an empty stomach.  Keep yourself hydrated with water, diet soda, or unsweetened iced tea.  Keep in mind that regular soda, juice, and other mixers may contain a lot of sugar and must be counted as carbs. What are tips for following this plan?  Reading food labels  Start by checking the serving size on the "Nutrition Facts" label of packaged foods and drinks. The amount of calories, carbs, fats, and other nutrients listed on the label is based on one serving of the item. Many items contain more than one serving per package.  Check the total grams (g) of carbs in one serving. You can calculate the number of servings of  carbs in one serving by dividing the total carbs by 15. For example, if a food has 30 g of total carbs, it would be equal to 2 servings of carbs.  Check the number of grams (g) of saturated and trans fats in one serving. Choose foods that have low or no amount of these fats.  Check the number of milligrams (mg) of salt (sodium) in one serving. Most people should limit total sodium intake to less than 2,300 mg per day.  Always check the nutrition information of  foods labeled as "low-fat" or "nonfat". These foods may be higher in added sugar or refined carbs and should be avoided.  Talk to your dietitian to identify your daily goals for nutrients listed on the label. Shopping  Avoid buying canned, premade, or processed foods. These foods tend to be high in fat, sodium, and added sugar.  Shop around the outside edge of the grocery store. This includes fresh fruits and vegetables, bulk grains, fresh meats, and fresh dairy. Cooking  Use low-heat cooking methods, such as baking, instead of high-heat cooking methods like deep frying.  Cook using healthy oils, such as olive, canola, or sunflower oil.  Avoid cooking with butter, cream, or high-fat meats. Meal planning  Eat meals and snacks regularly, preferably at the same times every day. Avoid going long periods of time without eating.  Eat foods high in fiber, such as fresh fruits, vegetables, beans, and whole grains. Talk to your dietitian about how many servings of carbs you can eat at each meal.  Eat 4-6 ounces (oz) of lean protein each day, such as lean meat, chicken, fish, eggs, or tofu. One oz of lean protein is equal to: ? 1 oz of meat, chicken, or fish. ? 1 egg. ?  cup of tofu.  Eat some foods each day that contain healthy fats, such as avocado, nuts, seeds, and fish. Lifestyle  Check your blood glucose regularly.  Exercise regularly as told by your health care provider. This may include: ? 150 minutes of moderate-intensity or vigorous-intensity exercise each week. This could be brisk walking, biking, or water aerobics. ? Stretching and doing strength exercises, such as yoga or weightlifting, at least 2 times a week.  Take medicines as told by your health care provider.  Do not use any products that contain nicotine or tobacco, such as cigarettes and e-cigarettes. If you need help quitting, ask your health care provider.  Work with a Social worker or diabetes educator to identify  strategies to manage stress and any emotional and social challenges. Questions to ask a health care provider  Do I need to meet with a diabetes educator?  Do I need to meet with a dietitian?  What number can I call if I have questions?  When are the best times to check my blood glucose? Where to find more information:  American Diabetes Association: diabetes.org  Academy of Nutrition and Dietetics: www.eatright.[employer] of Diabetes and Digestive and Kidney Diseases (NIH): [employer] Summary  A healthy meal plan will help you control your blood glucose and maintain a healthy lifestyle.  Working with a diet and nutrition specialist (dietitian) can help you make a meal plan that is best for you.  Keep in mind that carbohydrates (carbs) and alcohol have immediate effects on your blood glucose levels. It is important to count carbs and to use alcohol carefully. This information is not intended to replace advice given to you by your health care provider. Make sure you  discuss any questions you have with your health care provider. Document Revised: 09/06/2017 Document Reviewed: 10/29/2016 Elsevier Patient Education  2020 [employer].

## 2020-04-22 NOTE — Progress Notes (Signed)
Subjective  CC:  Chief Complaint  Patient presents with  . Hip Pain    left hip and left lower back, started several months ago but has progressively worsened    HPI: Margaret White is a 69 y.o. female who presents to the office today to address the problems listed above in the chief complaint.  69 year old female with fibromyalgia and DJD of lumbar spine presents due to worsening pain.  At her physical several months ago, she was markedly improved, however she is complaining of worsening low back pain.  Pain does not interfere with sleep but bothers her mostly with walking.  She denies radicular symptoms down the legs but she does admit to some pain wrapping around the hip on the left..  Pain is mostly around the mid low back.  She she does have hip arthritis in his had a hip replacement in the past.  She denies groin pain.  Fibromyalgia: Mostly stable although she is not sleeping well again.  She tends to have more pain when she is tired.  She had an excellent response to Cymbalta and gabapentin.  She had mild increase in her liver enzymes on Cymbalta that I am following.  She does have a history of [place].  Her mood is fair but she is undergoing stress related to illnesses in her family members.  We reviewed her most recent A1c which was elevated at 6.5.  Assessment  1. Bilateral primary osteoarthritis of hip   2. Spondylosis of lumbar region without myelopathy or radiculopathy   3. Abnormal LFTs   4. Insulin resistance   5. Fibromyalgia   6. NASH (nonalcoholic steatohepatitis)      Plan   Hip arthritis and DJD lumbar: Increased dose of gabapentin at night and trial of prednisone taper.  Fibromyalgia, increase Cymbalta dose, monitor LFTs closely  Insulin resistance, prediabetes: Nutrition advice given.  Follow up: No follow-ups on file.  05/11/2020  Orders Placed This Encounter  Procedures  . Comprehensive metabolic panel   Meds ordered this encounter  Medications  .  DULoxetine (CYMBALTA) 60 MG capsule    Sig: Take 1 capsule (60 mg total) by mouth daily.    Dispense:  90 capsule    Refill:  3    Replacing the 19m RX  . gabapentin (NEURONTIN) 300 MG capsule    Sig: Take 3053mevery morning, afternoon; take 60055mvery evening before bedtime    Dispense:  270 capsule    Refill:  3  . predniSONE (DELTASONE) 10 MG tablet    Sig: Take 4 tabs qd x 2 days, 3 qd x 2 days, 2 qd x 2d, 1qd x 3 days    Dispense:  21 tablet    Refill:  0      I reviewed the patients updated PMH, FH, and SocHx.    Patient Active Problem List   Diagnosis Date Noted  . Colon polyps 08/19/2019    Priority: High  . Statin intolerance 03/25/2018    Priority: High  . GERD (gastroesophageal reflux disease) 10/14/2017    Priority: High  . NASH (nonalcoholic steatohepatitis) 10/14/2017    Priority: High  . OSA (obstructive sleep apnea) 10/14/2017    Priority: High  . Fibromyalgia 10/14/2017    Priority: High  . Obesity (BMI 30-39.9) 10/14/2017    Priority: High  . Mixed hyperlipidemia 10/14/2017    Priority: High  . Essential hypertension 06/26/2017    Priority: High  . Incontinence without sensory awareness 05/08/2018  Priority: Medium  . Mixed incontinence 05/08/2018    Priority: Medium  . 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    Priority: Medium  . Bilateral primary osteoarthritis of hip 11/05/2017    Priority: Medium  . Spondylosis of lumbar region without myelopathy or radiculopathy 11/04/2017    Priority: Medium  . IBS (irritable bowel syndrome) 10/14/2017    Priority: Medium  . Recurrent major depressive disorder, in full remission ([place]) 06/26/2017    Priority: Medium  . 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    Priority: Low  . Allergic rhinitis 10/14/2017    Priority: Low  . Chronic maxillary sinusitis 10/14/2017    Priority: Low  . Vitamin B12 deficiency 10/14/2017    Priority: Low  . Vitamin D deficiency 10/14/2017    Priority: Low  .  Female cystocele 10/14/2017    Priority: Low  . Primary osteoarthritis of both hands 07/10/2017    Priority: Low  . Chronic cough 12/09/2018  . 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 History of cluster headache 10/14/2017  . Pelvic pain 10/14/2017   Current Meds  Medication Sig  . amLODipine (NORVASC) 5 MG tablet TAKE 1 TABLET DAILY  . DULoxetine (CYMBALTA) 60 MG capsule Take 1 capsule (60 mg total) by mouth daily.  Marland Kitchen esomeprazole (NEXIUM) 40 MG capsule TAKE 1 CAPSULE TWICE A DAY BEFORE MEALS  . FLUoxetine (PROZAC) 10 MG tablet Take 1 tablet (10 mg total) by mouth daily.  . fluticasone (FLONASE) 50 MCG/ACT nasal spray USE 1 SPRAY IN EACH NOSTRIL DAILY AS NEEDED FOR ALLERGIES (Patient taking differently: Place 1 spray into both nostrils daily. )  . gabapentin (NEURONTIN) 300 MG capsule Take 340m every morning, afternoon; take 6034mevery evening before bedtime  . [DISCONTINUED] DULoxetine (CYMBALTA) 30 MG capsule Take 1 capsule (30 mg total) by mouth daily.  . [DISCONTINUED] gabapentin (NEURONTIN) 300 MG capsule Take 1 capsule (300 mg total) by mouth 3 (three) times daily.    Allergies: Patient is allergic to codeine and sulfa antibiotics. Family History: Patient family history includes Cancer in her father; Heart disease in her father; Hypertension in her mother. Social History:  Patient  reports that she has never smoked. She has never used smokeless tobacco. She reports that she does not drink alcohol and does not use drugs.  Review of Systems: Constitutional: Negative for fever malaise or anorexia Cardiovascular: negative for chest pain Respiratory: negative for SOB or persistent cough Gastrointestinal: negative for abdominal pain  Objective  Vitals: BP 122/86   Pulse 84   Temp 98.9 F (37.2 C) (Temporal)   Resp 15   Wt 181 lb 6.4 oz (82.3 kg)   SpO2 96%   BMI 31.14 kg/m  General: no acute distress , A&Ox3 HEENT: PEERL, conjunctiva normal, neck is supple Cardiovascular:   RRR without murmur or gallop.  Respiratory:  Good breath sounds bilaterally, CTAB with normal respiratory effort Skin:  Warm, no rashes Low back: Negative straight leg test bilaterally, nontender, full range of motion.     Commons side effects, risks, benefits, and alternatives for medications and treatment plan prescribed today were discussed, and the patient expressed understanding of the given instructions. Patient is instructed to call or message via MyChart if he/she has any questions or concerns regarding our treatment plan. No barriers to understanding were identified. We discussed Red Flag symptoms and signs in detail. Patient expressed understanding regarding what to do in case of urgent or emergency type symptoms.   Medication list was reconciled, printed and  provided to the patient in AVS. Patient instructions and summary information was reviewed with the patient as documented in the AVS. This note was prepared with assistance of Dragon voice recognition software. Occasional wrong-word or sound-a-like substitutions may have occurred due to the inherent limitations of voice recognition software  This visit occurred during the SARS-CoV-2 public health emergency.  Safety protocols were in place, including screening questions prior to the visit, additional usage of staff PPE, and extensive cleaning of exam room while observing appropriate contact time as indicated for disinfecting solutions.

## 2020-05-11 ENCOUNTER — Encounter: Payer: Self-pay | Admitting: Family Medicine

## 2020-05-11 ENCOUNTER — Other Ambulatory Visit: Payer: Self-pay

## 2020-05-11 ENCOUNTER — Ambulatory Visit (INDEPENDENT_AMBULATORY_CARE_PROVIDER_SITE_OTHER): Payer: Medicare Other | Admitting: Family Medicine

## 2020-05-11 VITALS — BP 106/80 | HR 88 | Temp 98.3°F | Resp 15 | Ht 64.0 in | Wt 178.2 lb

## 2020-05-11 DIAGNOSIS — K7581 Nonalcoholic steatohepatitis (NASH): Secondary | ICD-10-CM

## 2020-05-11 DIAGNOSIS — M47816 Spondylosis without myelopathy or radiculopathy, lumbar region: Secondary | ICD-10-CM

## 2020-05-11 DIAGNOSIS — I10 Essential (primary) hypertension: Secondary | ICD-10-CM

## 2020-05-11 DIAGNOSIS — R7303 Prediabetes: Secondary | ICD-10-CM | POA: Diagnosis not present

## 2020-05-11 DIAGNOSIS — F3342 Major depressive disorder, recurrent, in full remission: Secondary | ICD-10-CM

## 2020-05-11 DIAGNOSIS — M160 Bilateral primary osteoarthritis of hip: Secondary | ICD-10-CM | POA: Diagnosis not present

## 2020-05-11 DIAGNOSIS — M797 Fibromyalgia: Secondary | ICD-10-CM

## 2020-05-11 LAB — POCT GLYCOSYLATED HEMOGLOBIN (HGB A1C): Hemoglobin A1C: 5.7 % — AB (ref 4.0–5.6)

## 2020-05-11 NOTE — Progress Notes (Signed)
Subjective  CC:  Chief Complaint  Patient presents with  . Prediabetes  . Elevated Hepatic Enzymes  . Fibromyalgia  . Depression    HPI: Margaret White is a 69 y.o. female who presents to the office today for follow up of diabetes and problems listed above in the chief complaint.   PreDiabetes follow up: In May, A1c was up to 6.5.  Since then she has lost 8 pounds and improve diet significantly.  She is using the go low diet.  Feels fine.  No symptoms of hyperglycemia.  No prior history of diabetes.  [facility]  Karlene Lineman with elevated enzymes, increased response likely in part due to increase in Cymbalta dosing.  We are monitoring this.  She denies right upper quadrant pain, jaundice or abnormal colored stool.  Fibromyalgia: Doing much better.  Sleep is improved.  Responsive to increased dose of gabapentin, Cymbalta.  Low back pain and hip pain, responsive to prednisone taper.  Doing much better.  Now with only intermittent pain with certain movements.  No radicular symptoms.  Tolerated prednisone well.  Mood: Much improved.  Wt Readings from Last 3 Encounters:  05/11/20 178 lb 3.2 oz (80.8 kg)  04/22/20 181 lb 6.4 oz (82.3 kg)  02/26/20 186 lb 3.2 oz (84.5 kg)    BP Readings from Last 3 Encounters:  05/11/20 106/80  04/22/20 122/86  02/26/20 122/80    Assessment  1. Prediabetes   2. Fibromyalgia   3. NASH (nonalcoholic steatohepatitis)   4. Essential hypertension   5. Spondylosis of lumbar region without myelopathy or radiculopathy   6. Bilateral primary osteoarthritis of hip   7. Recurrent major depressive disorder, in full remission ([place])      Plan   Prediabetes is currently well controlled.  A1c is much improved.  Now high normal.  Praised for dietary changes.  Recommend exercise and continuation of healthy diet and weight loss.  Recheck 3 months.  Fibromyalgia and low back pain are much improved.  Tolerating medications.  NASH  and elevated enzymes: Recheck today.  Continue Cymbalta unless elevation of enzymes or developing hyperbilirubinemia.  GI is aware.  Hypertension is well controlled.  Recheck in 3 months.  May be able to back off medications.  Depression: This medical condition is well controlled. There are no signs of complications, medication side effects, or red flags. Patient is instructed to continue the current treatment plan without change in therapies or medications.  On Cymbalta.  Follow up: Return for as scheduled.. Orders Placed This Encounter  Procedures  . Hepatic function panel  . Basic metabolic panel  . POCT glycosylated hemoglobin (Hb A1C)   No orders of the defined types were placed in this encounter.     Immunization History  Administered Date(s) Administered  . Influenza,inj,Quad PF,6+ Mos 08/06/2017, 07/31/2018  . Influenza-Unspecified 07/23/2019  . Pneumococcal Conjugate-13 03/24/2018  . Pneumococcal Polysaccharide-23 08/19/2019  . Tdap 04/25/2018    Diabetes Related Lab Review: Lab Results  Component Value Date   HGBA1C 5.7 (A) 05/11/2020   HGBA1C 6.4 02/08/2020   HGBA1C 6.2 12/09/2018    No results found for: Derl Barrow Lab Results  Component Value Date   CREATININE 0.97 04/22/2020   BUN 15 04/22/2020   NA 141 04/22/2020   K 4.8 04/22/2020   CL 100 04/22/2020   CO2 32 04/22/2020   Lab Results  Component Value Date   CHOL 190 02/08/2020   CHOL 253 (H)  04/17/2019   CHOL 218 (H) 01/22/2019   Lab Results  Component Value Date   HDL 49.30 02/08/2020   HDL 55.10 01/22/2019   HDL 65.90 07/30/2018   Lab Results  Component Value Date   LDLCALC 104 (H) 02/08/2020   LDLCALC 108 (H) 07/30/2018   LDLCALC 155 (H) 10/14/2017   Lab Results  Component Value Date   TRIG 185.0 (H) 02/08/2020   TRIG 260 (H) 04/17/2019   TRIG 205.0 (H) 01/22/2019   Lab Results  Component Value Date   CHOLHDL 4 02/08/2020   CHOLHDL 4 01/22/2019   CHOLHDL 3 07/30/2018    Lab Results  Component Value Date   LDLDIRECT 125.0 01/22/2019   The 10-year ASCVD risk score Mikey Bussing DC Jr., et al., 2013) is: 6.5%   Values used to calculate the score:     Age: 7 years     Sex: Female     Is Non-Hispanic African American: No     Diabetic: No     Tobacco smoker: No     Systolic Blood Pressure: 967 mmHg     Is BP treated: Yes     HDL Cholesterol: 49.3 mg/dL     Total Cholesterol: 190 mg/dL I have reviewed the PMH, Fam and Soc history. Patient Active Problem List   Diagnosis Date Noted  . Colon polyps 08/19/2019    Priority: High  . Statin intolerance 03/25/2018    Priority: High  . GERD (gastroesophageal reflux disease) 10/14/2017    Priority: High  . NASH (nonalcoholic steatohepatitis) 10/14/2017    Priority: High    Advanced hepatic steatosis by ultrasound; followed by Dr. Tarri Glenn   . OSA (obstructive sleep apnea) 10/14/2017    Priority: High  . Fibromyalgia 10/14/2017    Priority: High  . Obesity (BMI 30-39.9) 10/14/2017    Priority: High  . Mixed hyperlipidemia 10/14/2017    Priority: High  . Essential hypertension 06/26/2017    Priority: High  . Incontinence without sensory awareness 05/08/2018    Priority: Medium    Followed by [facility]    . Mixed incontinence 05/08/2018    Priority: Medium    Followed by [facility]    . 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    Priority: Medium  . Bilateral primary osteoarthritis of hip 11/05/2017    Priority: Medium    Bilateral right worse than left that is mild in nature X-rays obtained in [place] on 07/12/2017 that are available through the [facility] PACS system   . Spondylosis of lumbar region without myelopathy or radiculopathy 11/04/2017    Priority: Medium    MRI 11/01/2017: Severe L4-5 facet arthrosis on the right greater than left. Shallow disc bulging without significant neuroforaminal stenosis.   . IBS (irritable bowel syndrome) 10/14/2017    Priority: Medium    . Recurrent major depressive disorder, in full remission ([place]) 06/26/2017    Priority: Medium    Last Assessment & Plan:  Stable on medications  No suicidal ideation  Continue prozac   . 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    Priority: Low  . Allergic rhinitis 10/14/2017    Priority: Low  . Chronic maxillary sinusitis 10/14/2017    Priority: Low  . Vitamin B12 deficiency 10/14/2017    Priority: Low  . Vitamin D deficiency 10/14/2017    Priority: Low  . Female cystocele 10/14/2017    Priority: Low  . Primary osteoarthritis of both hands 07/10/2017    Priority: Low    Last  Assessment & Plan:  Try mobic  Stay hydrated  Monitor renal panel   . Chronic cough 12/09/2018  . 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 History of cluster headache 10/14/2017  . Pelvic pain 10/14/2017    Social History: Patient  reports that she has never smoked. She has never used smokeless tobacco. She reports that she does not drink alcohol and does not use drugs.  Review of Systems: Ophthalmic: negative for eye pain, loss of vision or double vision Cardiovascular: negative for chest pain Respiratory: negative for SOB or persistent cough Gastrointestinal: negative for abdominal pain Genitourinary: negative for dysuria or gross hematuria MSK: negative for foot lesions Neurologic: negative for weakness or gait disturbance  Objective  Vitals: BP 106/80   Pulse 88   Temp 98.3 F (36.8 C) (Temporal)   Resp 15   Ht 5' 4"  (1.626 m)   Wt 178 lb 3.2 oz (80.8 kg)   SpO2 94%   BMI 30.59 kg/m  General: well appearing, no acute distress  Psych:  Alert and oriented, normal mood and affect  Diabetic education: ongoing education regarding chronic disease management for diabetes was given today. We continue to reinforce the ABC's of diabetic management: A1c (<7 or 8 dependent upon patient), tight blood pressure control, and cholesterol management with goal LDL < 100 minimally. We discuss diet strategies, exercise  recommendations, medication options and possible side effects. At each visit, we review recommended immunizations and preventive care recommendations for diabetics and stress that good diabetic control can prevent other problems. See below for this patient's data.    Commons side effects, risks, benefits, and alternatives for medications and treatment plan prescribed today were discussed, and the patient expressed understanding of the given instructions. Patient is instructed to call or message via MyChart if he/she has any questions or concerns regarding our treatment plan. No barriers to understanding were identified. We discussed Red Flag symptoms and signs in detail. Patient expressed understanding regarding what to do in case of urgent or emergency type symptoms.   Medication list was reconciled, printed and provided to the patient in AVS. Patient instructions and summary information was reviewed with the patient as documented in the AVS. This note was prepared with assistance of Dragon voice recognition software. Occasional wrong-word or sound-a-like substitutions may have occurred due to the inherent limitations of voice recognition software  This visit occurred during the SARS-CoV-2 public health emergency.  Safety protocols were in place, including screening questions prior to the visit, additional usage of staff PPE, and extensive cleaning of exam room while observing appropriate contact time as indicated for disinfecting solutions.

## 2020-05-11 NOTE — Patient Instructions (Signed)
Please follow up as scheduled for your next visit with me: 08/10/2020   Your sugars are [facility] better! [facility] Marland Kitchen)  I will release your lab results to you on your MyChart account with further instructions. Please reply with any questions.   If you have any questions or concerns, please don't hesitate to send me a message via MyChart or call the office at [phone]. Thank you for visiting with [place] today! It's our pleasure caring for you.

## 2020-05-12 LAB — HEPATIC FUNCTION PANEL
AG Ratio: 1.5 (calc) (ref 1.0–2.5)
ALT: 94 U/L — ABNORMAL HIGH (ref 6–29)
AST: 70 U/L — ABNORMAL HIGH (ref 10–35)
Albumin: 4.2 g/dL (ref 3.6–5.1)
Alkaline phosphatase (APISO): 95 U/L (ref 37–153)
Bilirubin, Direct: 0.1 mg/dL (ref 0.0–0.2)
Globulin: 2.8 g/dL (calc) (ref 1.9–3.7)
Indirect Bilirubin: 0.5 mg/dL (calc) (ref 0.2–1.2)
Total Bilirubin: 0.6 mg/dL (ref 0.2–1.2)
Total Protein: 7 g/dL (ref 6.1–8.1)

## 2020-05-12 LAB — BASIC METABOLIC PANEL
BUN: 14 mg/dL (ref 7–25)
CO2: 29 mmol/L (ref 20–32)
Calcium: 9.6 mg/dL (ref 8.6–10.4)
Chloride: 103 mmol/L (ref 98–110)
Creat: 0.98 mg/dL (ref 0.50–0.99)
Glucose, Bld: 110 mg/dL — ABNORMAL HIGH (ref 65–99)
Potassium: 4.3 mmol/L (ref 3.5–5.3)
Sodium: 140 mmol/L (ref 135–146)

## 2020-06-14 ENCOUNTER — Encounter: Payer: Self-pay | Admitting: Family Medicine

## 2020-06-24 ENCOUNTER — Other Ambulatory Visit: Payer: Self-pay

## 2020-06-24 ENCOUNTER — Encounter: Payer: Self-pay | Admitting: Family Medicine

## 2020-06-24 ENCOUNTER — Ambulatory Visit (INDEPENDENT_AMBULATORY_CARE_PROVIDER_SITE_OTHER): Payer: Medicare Other | Admitting: Family Medicine

## 2020-06-24 VITALS — BP 120/88 | HR 83 | Temp 98.2°F | Ht 64.0 in | Wt 180.8 lb

## 2020-06-24 DIAGNOSIS — K7581 Nonalcoholic steatohepatitis (NASH): Secondary | ICD-10-CM

## 2020-06-24 DIAGNOSIS — Z23 Encounter for immunization: Secondary | ICD-10-CM

## 2020-06-24 DIAGNOSIS — L299 Pruritus, unspecified: Secondary | ICD-10-CM | POA: Diagnosis not present

## 2020-06-24 MED ORDER — HYDROXYZINE PAMOATE 25 MG PO CAPS: 25.0000 mg | ORAL_CAPSULE | Freq: Three times a day (TID) | ORAL | 1 refills | Status: DC | PRN

## 2020-06-24 NOTE — Patient Instructions (Signed)
I will release your lab results to you on your MyChart account with further instructions. Please reply with any questions.   I have placed a referral to dermatology to check your skin further.   I have placed an order for an ultrasound to reassess your liver and gallbladder.   You may try using the hydroxyzine 3x/day instead of benadryl. It may be more effective.

## 2020-06-25 ENCOUNTER — Encounter: Payer: Self-pay | Admitting: Family Medicine

## 2020-06-25 LAB — COMPLETE METABOLIC PANEL WITH GFR
AG Ratio: 1.5 (calc) (ref 1.0–2.5)
ALT: 74 U/L — ABNORMAL HIGH (ref 6–29)
AST: 70 U/L — ABNORMAL HIGH (ref 10–35)
Albumin: 4.1 g/dL (ref 3.6–5.1)
Alkaline phosphatase (APISO): 99 U/L (ref 37–153)
BUN/Creatinine Ratio: 10 (calc) (ref 6–22)
BUN: 12 mg/dL (ref 7–25)
CO2: 28 mmol/L (ref 20–32)
Calcium: 9.3 mg/dL (ref 8.6–10.4)
Chloride: 103 mmol/L (ref 98–110)
Creat: 1.18 mg/dL — ABNORMAL HIGH (ref 0.50–0.99)
GFR, Est African American: 55 mL/min/{1.73_m2} — ABNORMAL LOW (ref >=60)
GFR, Est Non African American: 48 mL/min/{1.73_m2} — ABNORMAL LOW (ref >=60)
Globulin: 2.7 g/dL (calc) (ref 1.9–3.7)
Glucose, Bld: 87 mg/dL (ref 65–99)
Potassium: 3.7 mmol/L (ref 3.5–5.3)
Sodium: 142 mmol/L (ref 135–146)
Total Bilirubin: 0.4 mg/dL (ref 0.2–1.2)
Total Protein: 6.8 g/dL (ref 6.1–8.1)

## 2020-06-25 LAB — SEDIMENTATION RATE: Sed Rate: 17 mm/h (ref 0–30)

## 2020-06-25 LAB — TSH: TSH: 3.21 mIU/L (ref 0.40–4.50)

## 2020-06-25 NOTE — Progress Notes (Signed)
Subjective  CC:  Chief Complaint  Patient presents with  . Pruritis    x 1 month, left side on body, denies any rashes present    HPI: Margaret White is a 69 y.o. female who presents to the office today to address the problems listed above in the chief complaint.  Acute onset of left-sided itching.  Patient reports she has itching that starts in her left elbow and ends at her left mid thorax.  She has had no rash.  No systemic symptoms.  Using Benadryl regularly to treat symptoms.  This does calm the itching.  She denies new medications.  No history of similar problems in the past.  She denies right upper quadrant pain.  She does have [place] and mildly elevated LFTs.  Her cancer screenings are up-to-date.  No constitutional symptoms.  Assessment  1. Pruritus   2. NASH (nonalcoholic steatohepatitis)   3. Need for immunization against influenza      Plan   Pruritus: Unclear cause, broad differential.  Check for biliary tract disease.  Hydroxyzine for supportive care.  Refer to dermatology for further work-up.  Check right upper quadrant ultrasound to assess for biliary sludge or gallstones.  Flu shot updated today  Follow up:   08/10/2020  Orders Placed This Encounter  Procedures  . US Abdomen Limited RUQ  . Flu Vaccine QUAD High Dose(Fluad)  . TSH  . COMPLETE METABOLIC PANEL WITH GFR  . Sedimentation rate  . Ambulatory referral to Dermatology   Meds ordered this encounter  Medications  . hydrOXYzine (VISTARIL) 25 MG capsule    Sig: Take 1-2 capsules (25-50 mg total) by mouth 3 (three) times daily as needed for itching.    Dispense:  90 capsule    Refill:  1      I reviewed the patients updated PMH, FH, and SocHx.    Patient Active Problem List   Diagnosis Date Noted  . Colon polyps 08/19/2019    Priority: High  . Statin intolerance 03/25/2018    Priority: High  . GERD (gastroesophageal reflux disease) 10/14/2017    Priority: High  . NASH (nonalcoholic  steatohepatitis) 10/14/2017    Priority: High  . OSA (obstructive sleep apnea) 10/14/2017    Priority: High  . Fibromyalgia 10/14/2017    Priority: High  . Obesity (BMI 30-39.9) 10/14/2017    Priority: High  . Mixed hyperlipidemia 10/14/2017    Priority: High  . Essential hypertension 06/26/2017    Priority: High  . Incontinence without sensory awareness 05/08/2018    Priority: Medium  . Mixed incontinence 05/08/2018    Priority: Medium  . 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    Priority: Medium  . Bilateral primary osteoarthritis of hip 11/05/2017    Priority: Medium  . Spondylosis of lumbar region without myelopathy or radiculopathy 11/04/2017    Priority: Medium  . IBS (irritable bowel syndrome) 10/14/2017    Priority: Medium  . Recurrent major depressive disorder, in full remission ([place]) 06/26/2017    Priority: Medium  . 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    Priority: Low  . Allergic rhinitis 10/14/2017    Priority: Low  . Chronic maxillary sinusitis 10/14/2017    Priority: Low  . Vitamin B12 deficiency 10/14/2017    Priority: Low  . Vitamin D deficiency 10/14/2017    Priority: Low  . Female cystocele 10/14/2017    Priority: Low  . Primary osteoarthritis of both hands 07/10/2017    Priority: Low  . Chronic  cough 12/09/2018  . 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 History of cluster headache 10/14/2017  . Pelvic pain 10/14/2017   Current Meds  Medication Sig  . amLODipine (NORVASC) 5 MG tablet TAKE 1 TABLET DAILY  . DULoxetine (CYMBALTA) 60 MG capsule Take 1 capsule (60 mg total) by mouth daily.  Marland Kitchen esomeprazole (NEXIUM) 40 MG capsule TAKE 1 CAPSULE TWICE A DAY BEFORE MEALS  . FLUoxetine (PROZAC) 10 MG tablet Take 1 tablet (10 mg total) by mouth daily.  . fluticasone (FLONASE) 50 MCG/ACT nasal spray USE 1 SPRAY IN EACH NOSTRIL DAILY AS NEEDED FOR ALLERGIES (Patient taking differently: Place 1 spray into both nostrils daily. )  . gabapentin (NEURONTIN) 300 MG  capsule Take 342m every morning, afternoon; take 6031mevery evening before bedtime    Allergies: Patient is allergic to codeine and sulfa antibiotics. Family History: Patient family history includes Cancer in her father; Heart disease in her father; Hypertension in her mother. Social History:  Patient  reports that she has never smoked. She has never used smokeless tobacco. She reports that she does not drink alcohol and does not use drugs.  Review of Systems: Constitutional: Negative for fever malaise or anorexia Cardiovascular: negative for chest pain Respiratory: negative for SOB or persistent cough Gastrointestinal: negative for abdominal pain  Objective  Vitals: BP 120/88   Pulse 83   Temp 98.2 F (36.8 C) (Temporal)   Ht 5' 4"  (1.626 m)   Wt 180 lb 12.8 oz (82 kg)   SpO2 97%   BMI 31.03 kg/m  General: no acute distress , A&Ox3 HEENT: PEERL, conjunctiva normal, neck is supple Cardiovascular:  RRR without murmur or gallop.  Respiratory:  Good breath  Gastrointestinal: soft, flat abdomen, normal active bowel sounds, no palpable masses, no hepatosplenomegaly, no appreciated hernias Skin without rash     Commons side effects, risks, benefits, and alternatives for medications and treatment plan prescribed today were discussed, and the patient expressed understanding of the given instructions. Patient is instructed to call or message via MyChart if he/she has any questions or concerns regarding our treatment plan. No barriers to understanding were identified. We discussed Red Flag symptoms and signs in detail. Patient expressed understanding regarding what to do in case of urgent or emergency type symptoms.   Medication list was reconciled, printed and provided to the patient in AVS. Patient instructions and summary information was reviewed with the patient as documented in the AVS. This note was prepared with assistance of Dragon voice recognition software. Occasional  wrong-word or sound-a-like substitutions may have occurred due to the inherent limitations of voice recognition software  This visit occurred during the SARS-CoV-2 public health emergency.  Safety protocols were in place, including screening questions prior to the visit, additional usage of staff PPE, and extensive cleaning of exam room while observing appropriate contact time as indicated for disinfecting solutions.

## 2020-07-05 ENCOUNTER — Ambulatory Visit
Admission: RE | Admit: 2020-07-05 | Discharge: 2020-07-05 | Disposition: A | Discharge status: Home / Self Care | Payer: Medicare Other | Source: Ambulatory Visit | Attending: Family Medicine | Admitting: Family Medicine

## 2020-07-05 DIAGNOSIS — K760 Fatty (change of) liver, not elsewhere classified: Secondary | ICD-10-CM | POA: Diagnosis not present

## 2020-07-05 DIAGNOSIS — K7581 Nonalcoholic steatohepatitis (NASH): Secondary | ICD-10-CM

## 2020-07-05 DIAGNOSIS — L299 Pruritus, unspecified: Secondary | ICD-10-CM

## 2020-07-14 ENCOUNTER — Other Ambulatory Visit: Payer: Self-pay

## 2020-07-14 DIAGNOSIS — K839 Disease of biliary tract, unspecified: Secondary | ICD-10-CM

## 2020-07-28 ENCOUNTER — Other Ambulatory Visit: Payer: Self-pay

## 2020-07-28 ENCOUNTER — Ambulatory Visit (HOSPITAL_COMMUNITY)
Admission: RE | Admit: 2020-07-28 | Discharge: 2020-07-28 | Disposition: A | Discharge status: Home / Self Care | Payer: Medicare Other | Source: Ambulatory Visit | Attending: Gastroenterology | Admitting: Gastroenterology

## 2020-07-28 DIAGNOSIS — K838 Other specified diseases of biliary tract: Secondary | ICD-10-CM | POA: Diagnosis not present

## 2020-07-28 DIAGNOSIS — K7581 Nonalcoholic steatohepatitis (NASH): Secondary | ICD-10-CM | POA: Diagnosis not present

## 2020-07-28 DIAGNOSIS — K839 Disease of biliary tract, unspecified: Secondary | ICD-10-CM

## 2020-07-28 DIAGNOSIS — K760 Fatty (change of) liver, not elsewhere classified: Secondary | ICD-10-CM | POA: Diagnosis not present

## 2020-07-28 MED ORDER — GADOBUTROL 1 MMOL/ML IV SOLN
8.0000 mL | Freq: Once | INTRAVENOUS | Status: AC | PRN
  Administered 2020-07-28: 8 mL via INTRAVENOUS

## 2020-08-10 ENCOUNTER — Encounter: Payer: Self-pay | Admitting: Family Medicine

## 2020-08-10 ENCOUNTER — Ambulatory Visit (INDEPENDENT_AMBULATORY_CARE_PROVIDER_SITE_OTHER): Payer: Medicare Other | Admitting: Family Medicine

## 2020-08-10 ENCOUNTER — Other Ambulatory Visit: Payer: Self-pay

## 2020-08-10 VITALS — BP 133/81 | HR 71 | Temp 98.1°F | Ht 64.0 in | Wt 177.0 lb

## 2020-08-10 DIAGNOSIS — M25532 Pain in left wrist: Secondary | ICD-10-CM | POA: Diagnosis not present

## 2020-08-10 DIAGNOSIS — L299 Pruritus, unspecified: Secondary | ICD-10-CM | POA: Diagnosis not present

## 2020-08-10 DIAGNOSIS — R7303 Prediabetes: Secondary | ICD-10-CM | POA: Diagnosis not present

## 2020-08-10 DIAGNOSIS — M19042 Primary osteoarthritis, left hand: Secondary | ICD-10-CM | POA: Diagnosis not present

## 2020-08-10 DIAGNOSIS — M6530 Trigger finger, unspecified finger: Secondary | ICD-10-CM

## 2020-08-10 LAB — POCT GLYCOSYLATED HEMOGLOBIN (HGB A1C): Hemoglobin A1C: 5.8 % — AB (ref 4.0–5.6)

## 2020-08-10 NOTE — Progress Notes (Signed)
Subjective  CC:  Chief Complaint  Patient presents with  . Fibromyalgia  . Prediabetes    HPI: Margaret White is a 69 y.o. female who presents to the office today for follow up of diabetes and problems listed above in the chief complaint.   Prediabetes follow up: has continued to work on diet. Last a1c had improved. Here to ensure it remains stable and in the normal to prediabetic range. No sxs of hyperglycemia.   Ithcing: improving. Comes and goes. No clear etiology found. Reviewed ultrasound and f/u MRI with MRCP: no significant changes.   Fibro flare is improved  Hand pain: has trigger fingers. Wrist pain and joint pain. Failed trigger finger injection. Types a lot for work.   Wt Readings from Last 3 Encounters:  08/10/20 177 lb (80.3 kg)  06/24/20 180 lb 12.8 oz (82 kg)  05/11/20 178 lb 3.2 oz (80.8 kg)    BP Readings from Last 3 Encounters:  08/10/20 133/81  06/24/20 120/88  05/11/20 106/80    Assessment  1. Prediabetes   2. Trigger finger, unspecified finger, unspecified laterality   3. 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 Pruritus   5. Left wrist pain      Plan   PreDiabetes is currently resolved. Praised. Continue healthy diet. Recheck 6 months.  Hand pain: refer to hand specialist for help.   Pruritus: unclear cause. But improving.   Fibro is stable.   Follow up: 6 months for cpe and recheck. . Orders Placed This Encounter  Procedures  . Ambulatory referral to Hand Surgery  . POCT glycosylated hemoglobin (Hb A1C)   No orders of the defined types were placed in this encounter.     Immunization History  Administered Date(s) Administered  . Fluad Quad(high Dose 65+) 06/24/2020  . Influenza,inj,Quad PF,6+ Mos 08/06/2017, 07/31/2018  . Influenza-Unspecified 07/23/2019  . Pneumococcal Conjugate-13 03/24/2018  . Pneumococcal Polysaccharide-23 08/19/2019  . Tdap 04/25/2018    Diabetes Related Lab Review: Lab  Results  Component Value Date   HGBA1C 5.8 (A) 08/10/2020   HGBA1C 5.7 (A) 05/11/2020   HGBA1C 6.4 02/08/2020    No results found for: Derl Barrow Lab Results  Component Value Date   CREATININE 1.18 (H) 06/24/2020   BUN 12 06/24/2020   NA 142 06/24/2020   K 3.7 06/24/2020   CL 103 06/24/2020   CO2 28 06/24/2020   Lab Results  Component Value Date   CHOL 190 02/08/2020   CHOL 253 (H) 04/17/2019   CHOL 218 (H) 01/22/2019   Lab Results  Component Value Date   HDL 49.30 02/08/2020   HDL 55.10 01/22/2019   HDL 65.90 07/30/2018   Lab Results  Component Value Date   LDLCALC 104 (H) 02/08/2020   LDLCALC 108 (H) 07/30/2018   LDLCALC 155 (H) 10/14/2017   Lab Results  Component Value Date   TRIG 185.0 (H) 02/08/2020   TRIG 260 (H) 04/17/2019   TRIG 205.0 (H) 01/22/2019   Lab Results  Component Value Date   CHOLHDL 4 02/08/2020   CHOLHDL 4 01/22/2019   CHOLHDL 3 07/30/2018   Lab Results  Component Value Date   LDLDIRECT 125.0 01/22/2019   The 10-year ASCVD risk score Mikey Bussing DC Jr., et al., 2013) is: 10%   Values used to calculate the score:     Age: 81 years     Sex: Female     Is Non-Hispanic African American: No     Diabetic:  No     Tobacco smoker: No     Systolic Blood Pressure: 269 mmHg     Is BP treated: Yes     HDL Cholesterol: 49.3 mg/dL     Total Cholesterol: 190 mg/dL I have reviewed the PMH, Fam and Soc history. Patient Active Problem List   Diagnosis Date Noted  . Colon polyps 08/19/2019    Priority: High  . Statin intolerance 03/25/2018    Priority: High  . GERD (gastroesophageal reflux disease) 10/14/2017    Priority: High  . NASH (nonalcoholic steatohepatitis) 10/14/2017    Priority: High    Advanced hepatic steatosis by ultrasound; followed by Dr. Tarri Glenn   . OSA (obstructive sleep apnea) 10/14/2017    Priority: High  . Fibromyalgia 10/14/2017    Priority: High  . Obesity (BMI 30-39.9) 10/14/2017    Priority: High  . Mixed  hyperlipidemia 10/14/2017    Priority: High  . Essential hypertension 06/26/2017    Priority: High  . Incontinence without sensory awareness 05/08/2018    Priority: Medium    Followed by [facility]    . Mixed incontinence 05/08/2018    Priority: Medium    Followed by [facility]    . 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    Priority: Medium  . Bilateral primary osteoarthritis of hip 11/05/2017    Priority: Medium    Bilateral right worse than left that is mild in nature X-rays obtained in [place] on 07/12/2017 that are available through the [facility] PACS system   . Spondylosis of lumbar region without myelopathy or radiculopathy 11/04/2017    Priority: Medium    MRI 11/01/2017: Severe L4-5 facet arthrosis on the right greater than left. Shallow disc bulging without significant neuroforaminal stenosis.   . IBS (irritable bowel syndrome) 10/14/2017    Priority: Medium  . Recurrent major depressive disorder, in full remission ([place]) 06/26/2017    Priority: Medium    Last Assessment & Plan:  Stable on medications  No suicidal ideation  Continue prozac   . 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    Priority: Low  . Allergic rhinitis 10/14/2017    Priority: Low  . Chronic maxillary sinusitis 10/14/2017    Priority: Low  . Vitamin B12 deficiency 10/14/2017    Priority: Low  . Vitamin D deficiency 10/14/2017    Priority: Low  . Female cystocele 10/14/2017    Priority: Low  . Primary osteoarthritis of both hands 07/10/2017    Priority: Low    Last Assessment & Plan:  Try mobic  Stay hydrated  Monitor renal panel   . Chronic cough 12/09/2018  . 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 History of cluster headache 10/14/2017  . Pelvic pain 10/14/2017    Social History: Patient  reports that she has never smoked. She has never used smokeless tobacco. She reports that she does not drink alcohol and does not use drugs.  Review of Systems: Ophthalmic: negative for eye  pain, loss of vision or double vision Cardiovascular: negative for chest pain Respiratory: negative for SOB or persistent cough Gastrointestinal: negative for abdominal pain Genitourinary: negative for dysuria or gross hematuria MSK: negative for foot lesions Neurologic: negative for weakness or gait disturbance  Objective  Vitals: BP 133/81   Pulse 71   Temp 98.1 F (36.7 C) (Temporal)   Ht 5' 4"  (1.626 m)   Wt 177 lb (80.3 kg)   SpO2 98%   BMI 30.38 kg/m  General: well appearing, no acute distress  Psych:  Alert and oriented, normal mood and affect   Diabetic education: ongoing education regarding chronic disease management for diabetes was given today. We continue to reinforce the ABC's of diabetic management: A1c (<7 or 8 dependent upon patient), tight blood pressure control, and cholesterol management with goal LDL < 100 minimally. We discuss diet strategies, exercise recommendations, medication options and possible side effects. At each visit, we review recommended immunizations and preventive care recommendations for diabetics and stress that good diabetic control can prevent other problems. See below for this patient's data.    Commons side effects, risks, benefits, and alternatives for medications and treatment plan prescribed today were discussed, and the patient expressed understanding of the given instructions. Patient is instructed to call or message via MyChart if he/she has any questions or concerns regarding our treatment plan. No barriers to understanding were identified. We discussed Red Flag symptoms and signs in detail. Patient expressed understanding regarding what to do in case of urgent or emergency type symptoms.   Medication list was reconciled, printed and provided to the patient in AVS. Patient instructions and summary information was reviewed with the patient as documented in the AVS. This note was prepared with assistance of Dragon voice recognition software.  Occasional wrong-word or sound-a-like substitutions may have occurred due to the inherent limitations of voice recognition software  This visit occurred during the SARS-CoV-2 public health emergency.  Safety protocols were in place, including screening questions prior to the visit, additional usage of staff PPE, and extensive cleaning of exam room while observing appropriate contact time as indicated for disinfecting solutions.

## 2020-08-10 NOTE — Patient Instructions (Signed)
Please return in 6 months for your annual complete physical; please come fasting.  I have referred you to a hand specialist to assist with your hand pain symptoms. We will call you with more information.   If you have any questions or concerns, please don't hesitate to send me a message via MyChart or call the office at [phone]. Thank you for visiting with [place] today! It's our pleasure caring for you.

## 2020-08-18 DIAGNOSIS — M19041 Primary osteoarthritis, right hand: Secondary | ICD-10-CM | POA: Diagnosis not present

## 2020-08-18 DIAGNOSIS — M65312 Trigger thumb, left thumb: Secondary | ICD-10-CM | POA: Diagnosis not present

## 2020-08-18 DIAGNOSIS — M19042 Primary osteoarthritis, left hand: Secondary | ICD-10-CM | POA: Diagnosis not present

## 2020-08-18 DIAGNOSIS — M19049 Primary osteoarthritis, unspecified hand: Secondary | ICD-10-CM | POA: Diagnosis not present

## 2020-08-18 DIAGNOSIS — M65342 Trigger finger, left ring finger: Secondary | ICD-10-CM | POA: Diagnosis not present

## 2020-08-26 ENCOUNTER — Ambulatory Visit
Admission: RE | Admit: 2020-08-26 | Discharge: 2020-08-26 | Disposition: A | Discharge status: Home / Self Care | Payer: Medicare Other | Source: Ambulatory Visit | Attending: Physician Assistant | Admitting: Physician Assistant

## 2020-08-26 ENCOUNTER — Other Ambulatory Visit: Payer: Self-pay

## 2020-08-26 DIAGNOSIS — N6310 Unspecified lump in the right breast, unspecified quadrant: Secondary | ICD-10-CM

## 2020-08-26 DIAGNOSIS — N6311 Unspecified lump in the right breast, upper outer quadrant: Secondary | ICD-10-CM | POA: Diagnosis not present

## 2020-08-26 DIAGNOSIS — R928 Other abnormal and inconclusive findings on diagnostic imaging of breast: Secondary | ICD-10-CM | POA: Diagnosis not present

## 2020-08-26 DIAGNOSIS — N6312 Unspecified lump in the right breast, upper inner quadrant: Secondary | ICD-10-CM | POA: Diagnosis not present

## 2020-08-29 ENCOUNTER — Other Ambulatory Visit: Payer: Self-pay | Admitting: Family Medicine

## 2020-08-29 DIAGNOSIS — M797 Fibromyalgia: Secondary | ICD-10-CM

## 2020-08-30 DIAGNOSIS — H16223 Keratoconjunctivitis sicca, not specified as Sjogren's, bilateral: Secondary | ICD-10-CM | POA: Diagnosis not present

## 2020-08-30 DIAGNOSIS — H43393 Other vitreous opacities, bilateral: Secondary | ICD-10-CM | POA: Diagnosis not present

## 2020-08-30 DIAGNOSIS — Z961 Presence of intraocular lens: Secondary | ICD-10-CM | POA: Diagnosis not present

## 2020-08-30 DIAGNOSIS — G43001 Migraine without aura, not intractable, with status migrainosus: Secondary | ICD-10-CM | POA: Diagnosis not present

## 2020-08-31 ENCOUNTER — Telehealth: Payer: Self-pay

## 2020-08-31 DIAGNOSIS — Z20828 Contact with and (suspected) exposure to other viral communicable diseases: Secondary | ICD-10-CM | POA: Diagnosis not present

## 2020-08-31 DIAGNOSIS — J0110 Acute frontal sinusitis, unspecified: Secondary | ICD-10-CM | POA: Diagnosis not present

## 2020-08-31 NOTE — Telephone Encounter (Signed)
Patient called she stated she is having dental work done and the office wants to know if she still needs to take antibiotics. [place]

## 2020-08-31 NOTE — Telephone Encounter (Signed)
Patient aware she does not need pre med

## 2020-08-31 NOTE — Telephone Encounter (Signed)
Patient called she stated she is having dental work done and the office wants to know if she still needs to take antibiotics. [place]

## 2020-09-06 ENCOUNTER — Encounter: Payer: Self-pay | Admitting: Gastroenterology

## 2020-09-06 ENCOUNTER — Ambulatory Visit (INDEPENDENT_AMBULATORY_CARE_PROVIDER_SITE_OTHER): Payer: Medicare Other | Admitting: Gastroenterology

## 2020-09-06 VITALS — BP 126/70 | HR 64 | Ht 64.0 in | Wt 177.0 lb

## 2020-09-06 DIAGNOSIS — K839 Disease of biliary tract, unspecified: Secondary | ICD-10-CM | POA: Diagnosis not present

## 2020-09-06 DIAGNOSIS — K7581 Nonalcoholic steatohepatitis (NASH): Secondary | ICD-10-CM | POA: Diagnosis not present

## 2020-09-06 DIAGNOSIS — K6389 Other specified diseases of intestine: Secondary | ICD-10-CM | POA: Diagnosis not present

## 2020-09-06 DIAGNOSIS — R748 Abnormal levels of other serum enzymes: Secondary | ICD-10-CM

## 2020-09-06 NOTE — Progress Notes (Signed)
Referring Provider: Leamon Arnt, MD Primary Care Physician:  Leamon Arnt, MD   Chief Complaint: Abnormal bile duct   IMPRESSION:  Abnormal bile duct on ultrasound and MRI/MRCP NASH presenting with elevated ALT, echogenic liver on ultrasound    - HOMA-IR 3.9    - NASH FibroSURE 04/17/19: F0 S3    - [place] Elastography 04/30/19: F0/F1 with minimal fibrosis Dyspepsia +/- GERD    - H pylori negative 12/09/18    - abdominal ultrasound 12/18/18: echogenic liver, no gallstones, no biliary abnormalities    - labs 12/09/18: normal liver enzymes except for ALT 40    - improved on BID PPI Bacterial overgrowth presenting with bloating    - Hydrogen and Methane Analysis 04/20/19: + baceterial overgrowth.        - Increase in hydrogen 28ppp, increase in Methan 4ppm       - Increase in combined hydrogen and methane 32ppm       - Xifaxan 550 mg TID and Neomycin 500 mg BID x 14 days in November NSAID-related gastric erosions on EGD 03/25/19    - biopsies negative for H pylori Previously using daily NSAIDs (Excedrin twice daily for headaches) Personal history of colon polyps    -Polyps removed on colonoscopy in [place]    -Tubular adenoma removed by Dr. Lyndel Safe 10/20/2015    -Surveillance endoscopy recommended in 5 years (2022) History of IBS with alternating diarrhea and constipation    - previously treated with Imodium    - currently using Metamucil Hemorrhoids  Abnormal common bile duct on ultrasound and MRI: EGD with EUS using side-viewing endoscope recommended to evaluate for small stones, micro choledocholithiasis, sludge, and even an ampullary lesion. Discussed with Dr. Rush Landmark who agrees to perform the procedure.   Dyspepsia +/- GERD improved on twice daily PPI therapy and avoidance of NSAIDs.  Bacterial overgrowth responded to Xifaxan. Will need to plan retreatment with combination Xifaxan and Neomycin 500 mg BID with recurrent symptoms given her breath test results.   NASH diagnosis  supported by Fibrosure and elastography with no significant fibrosis.  Supported by HOMA IR of 3.9. Reviewed the natural history and treatment of fatty liver disease. Given her history of diabetes and insulin resistance, she is at risk for fibrosis. Will restage her liver disease in July 2022.  History of polyps: Surveillance colonoscopy due 2022. She will wait until the summer to have the procedure for scheduling convenience.  PLAN: - EGD with EUS with Dr. Rush Landmark to evaluate the bile duct.  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 She has a history of IBS with alternating diarrhea and constipation, tubular adenoma removed on colonoscopy with Dr. Lyndel Safe in 2017 and sigmoid diverticulosis.   She had an EGD 03/25/19 that revealed reactive gastropathy and gastric erosions. Biopsies were negative for H pylori and celiac. Dyspepsia, postprandial vomiting, atypical chest pain, heartburn, dysphonia, cough, "esophageal rawness", and brash improved on twice daily PPI.   SIBO testing performed for persistent bloating with associated distension and frequent eructation.  Hydrogen and Methane Analysis 04/20/19 + baceterial overgrowth. Increase in hydrogen 28ppp, increase in Methane 4ppm, increased in combained hydrogen and methane 32ppm. She was treatment with Xifaxan 550 mg TID and Neomycin 500 mg BID x 14 days.. Symptoms improved  while on treatment but returned after completing antibiotics.    Was found to have a persistently elevated ALT of 40.  Abdominal ultrasound 12/18/2018 showed mildly increased echogenic liver. No other abnormalities seen.  Staging of liver disease included: NASH FibroSURE 04/17/19:  F0 S3 Ferritin 56.6, iron 126 HOMA 3.9 [place] Elastography  04/30/19: F0/F1 with minimal fibrosis  More recently reported itching to the left chest, back, and arm. Liver enzymes were stable but Dr. Jonni Sanger arranged for abdominal ultrasound. Abdominal ultrasound 07/05/20 showed fatty liver and increased CBD without intrahepatic ductal dilatation, new compared to 12/2018.  MRI/MRCP 07/28/20 showed diffuse hepatic steatosis, no hepatic mass, mild biliary ductal dilatation with CBD measuring 63m.   Labs 06/24/20: TB 0.4, AST 70, ALT 74, alk phos 114  Her pruritus has resolved. No jaundice, abdominal pain, or acholic stools. She has no new complaints or concerns.   Past Medical History:  Diagnosis Date  . Allergic rhinitis   . Anxiety   . Avascular necrosis of hip, right ([place] 03/24/2018  . Chronic cluster headache   . Chronic sinusitis   . Diabetes mellitus type 2, diet-controlled ([place]    per pt currently no taking metformin, followed by pcp  . Eczema   . Essential hypertension    cardiologsit-- dr dr mBettina Gavia . Fibromyalgia   . GERD (gastroesophageal reflux disease)   . Hepatic steatosis 10/14/2017  . Hiatal hernia   . 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 History of colon polyps   . History of hyperthyroidism 2005  . History of pneumonia, recurrent   . History of seizure 2006   x1  . IBS (irritable bowel syndrome)   . Mixed hyperlipidemia   . Nodule of lower lobe of left lung 10/2018   571m   pulmologist-- dr i.Meda CoffeebrLeory Plowmanstable per lov note in epic  . Ocular rosacea   . OSA (obstructive sleep apnea)    per pt intolerant to cpap due to sinus issues,  uses mouth guard   . Osteitis pubis ([place]1/29/2019   [facility]   . Primary osteoarthritis of both hands  07/10/2017  . Recurrent major depressive disorder, in full remission ([place]09/19/2018  . RLS (restless legs syndrome)   . Seasonal allergies   . Solitary pulmonary nodule    pulmologist-- dr i. brLeory Plowman lov note in epic,  stable  . 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 Shallow disc bulging without significant neuroforaminal stenosis.  . Marland KitchenUI (stress urinary incontinence, female)   . Vitamin B 12 deficiency   . Vitamin D deficiency     Past Surgical History:  Procedure Laterality Date  . ABDOMINOPLASTY  2006  . ANAL FISSURE REPAIR  2006  . BLADDER SURGERY  1984  . BREAST BIOPSY Right     benign    . CARPAL TUNNEL RELEASE Bilateral 1989  . CATARACT EXTRACTION W/ INTRAOCULAR LENS  IMPLANT, BILATERAL  2017  . COLONOSCOPY W/ POLYPECTOMY  2017  . ECPingree. EYE SURGERY  child    x4  1957-1960  . INCONTINENCE SURGERY    .  NASAL SEPTUM SURGERY  1987  . PUBOVAGINAL SLING N/A 03/24/2019   Procedure: CYSTOSCOPY  Gaynelle Arabian;  Surgeon: Bjorn Loser, MD;  Location[facility];  Service: Urology;  Laterality: N/A;  . RHINOPLASTY  2007  . TOTAL ABDOMINAL HYSTERECTOMY W/ BILATERAL SALPINGOOPHORECTOMY  1984   w/ Marshall-Marchetti (bladder tack suspension)  . TOTAL HIP ARTHROPLASTY Right 04/11/2018   Procedure: RIGHT TOTAL HIP ARTHROPLASTY ANTERIOR APPROACH;  Surgeon: Mcarthur Rossetti, MD;  Location: [facility];  Service: Orthopedics;  Laterality: Right;  . ULNAR NERVE TRANSPOSITION Left 1990s    Current Outpatient Medications  Medication Sig Dispense Refill  . amLODipine (NORVASC) 5 MG tablet TAKE 1 TABLET DAILY 90 tablet 3  . DULoxetine (CYMBALTA) 60 MG capsule Take 1 capsule (60 mg total) by mouth daily. 90 capsule 3  . esomeprazole (NEXIUM) 40 MG capsule TAKE 1 CAPSULE TWICE A DAY BEFORE MEALS 180 capsule 3  . FLUoxetine  (PROZAC) 10 MG tablet Take 1 tablet (10 mg total) by mouth daily. 90 tablet 3  . fluticasone (FLONASE) 50 MCG/ACT nasal spray USE 1 SPRAY IN EACH NOSTRIL DAILY AS NEEDED FOR ALLERGIES (Patient taking differently: Place 1 spray into both nostrils daily. ) 16 g 5  . gabapentin (NEURONTIN) 300 MG capsule TAKE 1 CAPSULE THREE TIMES A DAY 270 capsule 3  . hydrOXYzine (VISTARIL) 25 MG capsule Take 1-2 capsules (25-50 mg total) by mouth 3 (three) times daily as needed for itching. 90 capsule 1   No current facility-administered medications for this visit.    Allergies as of 09/06/2020 - Review Complete 08/10/2020  Allergen Reaction Noted  . Codeine Itching 01/10/2016  . Sulfa antibiotics Rash and Other (See Comments) 01/10/2016    Family History  Problem Relation Age of Onset  . Hypertension Mother   . Cancer Father   . Heart disease Father   . Colon cancer Neg Hx   . Esophageal cancer Neg Hx   . Rectal cancer Neg Hx   . Stomach cancer Neg Hx     Social History   Socioeconomic History  . Marital status: Married    Spouse name: Not on file  . Number of children: Not on file  . Years of education: Not on file  . Highest education level: Not on file  Occupational History  . Occupation: Product manager: [employer]  Tobacco Use  . Smoking status: Never Smoker  . Smokeless tobacco: Never Used  Vaping Use  . Vaping Use: Never used  Substance and Sexual Activity  . Alcohol use: No  . Drug use: Never  . Sexual activity: Yes    Birth control/protection: Surgical  Other Topics Concern  . Not on file  Social History Narrative  . Not on file   Social Determinants of Health   Financial Resource Strain:   . Difficulty of Paying Living Expenses: Not on file  Food Insecurity:   . Worried About Charity fundraiser in the Last Year: Not on file  . Ran Out of Food in the Last Year: Not on file  Transportation Needs:   . Lack of Transportation (Medical): Not on file   . Lack of Transportation (Non-Medical): Not on file  Physical Activity:   . Days of Exercise per Week: Not on file  . Minutes of Exercise per Session: Not on file  Stress:   . Feeling of Stress : Not on file  Social Connections:   . Frequency of Communication with Friends  and Family: Not on file  . Frequency of Social Gatherings with Friends and Family: Not on file  . Attends Religious Services: Not on file  . Active Member of Clubs or Organizations: Not on file  . Attends Archivist Meetings: Not on file  . Marital Status: Not on file  Intimate Partner Violence:   . Fear of Current or Ex-Partner: Not on file  . Emotionally Abused: Not on file  . Physically Abused: Not on file  . Sexually Abused: Not on file    Physical Exam: General:   Alert, in NAD. No scleral icterus. No bilateral temporal wasting.  Heart:  Regular rate and rhythm; no murmurs Pulm: Clear anteriorly; no wheezing Abdomen:  Soft. Nontender. Nondistended. Normal bowel sounds. No rebound or guarding. No hepatosplenomegaly. LAD: No inguinal or umbilical LAD Extremities:  Without edema. Neurologic:  Alert and  oriented x4;  grossly normal neurologically; no asterixis or clonus. Skin: No jaundice. No palmar erythema or spider angioma.  Psych:  Alert and cooperative. Normal mood and affect.   Adael Culbreath L. Tarri Glenn, MD, MPH [facility] 09/06/2020, 1:24 PM

## 2020-09-06 NOTE — Patient Instructions (Addendum)
I have recommended an upper endoscopy with endoscopic ultrasound to further evaluate your mildly dilated common bile duct.  This procedure will be performed at the hospital with Dr. Rush Landmark.  ENDOSCOPY: You have been scheduled for a colonoscopy. Please follow written instructions given to you at your visit today.   INHALERS: If you use inhalers (even only as needed), please bring them with you on the day of your procedure.  Surveillance colonoscopy is due in 2022.  We discussed deferring the timing of this procedure until school is out in the summer.  I would also like to see you in July to follow-up on your steatohepatitis (fatty liver with associated inflammation).  If you are age 69 or older, your body mass index should be between 23-30. Your Body mass index is 30.38 kg/m. If this is out of the aforementioned range listed, please consider follow up with your Primary Care Provider.  Thank you for trusting me with your gastrointestinal care!    Thornton Park, MD, MPH

## 2020-09-07 ENCOUNTER — Encounter: Payer: Self-pay | Admitting: Family Medicine

## 2020-09-07 ENCOUNTER — Encounter: Payer: Self-pay | Admitting: Gastroenterology

## 2020-09-14 ENCOUNTER — Ambulatory Visit (INDEPENDENT_AMBULATORY_CARE_PROVIDER_SITE_OTHER): Payer: Medicare Other | Admitting: Family Medicine

## 2020-09-14 ENCOUNTER — Other Ambulatory Visit: Payer: Self-pay

## 2020-09-14 VITALS — BP 128/80 | HR 89 | Temp 97.6°F | Resp 16 | Ht 64.0 in | Wt 178.0 lb

## 2020-09-14 DIAGNOSIS — M797 Fibromyalgia: Secondary | ICD-10-CM

## 2020-09-14 DIAGNOSIS — B373 Candidiasis of vulva and vagina: Secondary | ICD-10-CM | POA: Diagnosis not present

## 2020-09-14 DIAGNOSIS — G44221 Chronic tension-type headache, intractable: Secondary | ICD-10-CM | POA: Diagnosis not present

## 2020-09-14 DIAGNOSIS — B3731 Acute candidiasis of vulva and vagina: Secondary | ICD-10-CM

## 2020-09-14 MED ORDER — CYCLOBENZAPRINE HCL 10 MG PO TABS: 10.0000 mg | ORAL_TABLET | Freq: Every evening | ORAL | 0 refills | Status: DC | PRN

## 2020-09-14 MED ORDER — DOXEPIN HCL 10 MG PO CAPS: 10.0000 mg | ORAL_CAPSULE | Freq: Every day | ORAL | 5 refills | Status: DC

## 2020-09-14 MED ORDER — KETOROLAC TROMETHAMINE 60 MG/2ML IM SOLN
60.0000 mg | Freq: Once | INTRAMUSCULAR | Status: AC
  Administered 2020-09-14: 60 mg via INTRAMUSCULAR

## 2020-09-14 MED ORDER — FLUCONAZOLE 150 MG PO TABS: ORAL_TABLET | ORAL | 0 refills | Status: DC

## 2020-09-14 NOTE — Patient Instructions (Signed)
Please follow up if symptoms do not improve or as needed.   I have referred you to physical therapy to try to see if dry needling will help your pain.  Use the doxepin at night for sleep. Can add the flexeril for muscle spasm if needed.  Heat and massage are good as well.

## 2020-09-14 NOTE — Progress Notes (Signed)
Subjective  CC:  Chief Complaint  Patient presents with  . Headache    HPI: Margaret White is a 69 y.o. female who presents to the office today to address the problems listed above in the chief complaint.  69 year old with fibromyalgia presents with 6-week history of almost daily headache.  Described as pain starting the back of the upper shoulders, neck or back of the head and at times frontal.  At times it is behind either eye.  She awakens most days with a headache.  Tylenol will help but then it returns.  She admits to increased stressors, multiple personal issues are ongoing.  She denies photophobia, phonophobia, nausea or vomiting.  No history of migraines.  She has a remote history of cluster headaches but these feel different.  Over-the-counter medicines including Excedrin Migraine, Tylenol 3 times a day have not resolved the headaches.  Her fibromyalgia is also active.  She is a long history of pain and most medications do not help.  She is currently on Cymbalta.  NSAIDs, tramadol, Norco have not been helpful.  Massage to the back of the neck and upper back was helpful but only momentarily.  She has poor sleep and is overtired.  She denies fevers, chills or neck stiffness.  Complains of white discharge with vaginal itching. Assessment  1. Chronic tension-type headache, intractable   2. Fibromyalgia   3. Yeast vaginitis      Plan   Chronic tension type headache with possible rebound headaches: Counseling done.  Flexeril.  Refer to physical therapy for dry needling.  Consider acupuncture.  Doxepin to help improve sleep quality and fibromyalgia.  Will monitor for serotonin syndrome given that she is already on Prozac, low dose and Cymbalta.  No red flag symptoms are noted.  To emergency room for worse headache or severe headache or neurologic symptoms.  Patient responded well to Toradol injection with mild improvement in headache symptoms during the office.  No complications.  No  adverse effects.  Fibromyalgia continues to be active at this time induced by stressors.  Doxepin for sleep  Diflucan for yeast infection, follow-up if not improved   Follow up: As needed 02/09/2021  Orders Placed This Encounter  Procedures  . Ambulatory referral to Physical Therapy   Meds ordered this encounter  Medications  . cyclobenzaprine (FLEXERIL) 10 MG tablet    Sig: Take 1 tablet (10 mg total) by mouth at bedtime as needed for muscle spasms.    Dispense:  30 tablet    Refill:  0  . doxepin (SINEQUAN) 10 MG capsule    Sig: Take 1 capsule (10 mg total) by mouth at bedtime.    Dispense:  30 capsule    Refill:  5  . ketorolac (TORADOL) injection 60 mg  . fluconazole (DIFLUCAN) 150 MG tablet    Sig: Take one tablet today; may repeat in 3 days if symptoms persist    Dispense:  2 tablet    Refill:  0      I reviewed the patients updated PMH, FH, and SocHx.    Patient Active Problem List   Diagnosis Date Noted  . Colon polyps 08/19/2019    Priority: High  . Statin intolerance 03/25/2018    Priority: High  . GERD (gastroesophageal reflux disease) 10/14/2017    Priority: High  . NASH (nonalcoholic steatohepatitis) 10/14/2017    Priority: High  . OSA (obstructive sleep apnea) 10/14/2017    Priority: High  . Fibromyalgia 10/14/2017  Priority: High  . Obesity (BMI 30-39.9) 10/14/2017    Priority: High  . Mixed hyperlipidemia 10/14/2017    Priority: High  . Essential hypertension 06/26/2017    Priority: High  . Incontinence without sensory awareness 05/08/2018    Priority: Medium  . Mixed incontinence 05/08/2018    Priority: Medium  . 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    Priority: Medium  . Bilateral primary osteoarthritis of hip 11/05/2017    Priority: Medium  . Spondylosis of lumbar region without myelopathy or radiculopathy 11/04/2017    Priority: Medium  . IBS (irritable bowel syndrome) 10/14/2017    Priority: Medium  . Recurrent  major depressive disorder, in full remission ([place]) 06/26/2017    Priority: Medium  . 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    Priority: Low  . Allergic rhinitis 10/14/2017    Priority: Low  . Chronic maxillary sinusitis 10/14/2017    Priority: Low  . Vitamin B12 deficiency 10/14/2017    Priority: Low  . Vitamin D deficiency 10/14/2017    Priority: Low  . Female cystocele 10/14/2017    Priority: Low  . Primary osteoarthritis of both hands 07/10/2017    Priority: Low  . Chronic cough 12/09/2018  . 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 History of cluster headache 10/14/2017  . Pelvic pain 10/14/2017   No outpatient medications have been marked as taking for the 09/14/20 encounter (Office Visit) with Leamon Arnt, MD.    Allergies: Patient is allergic to codeine and sulfa antibiotics. Family History: Patient family history includes Cancer in her father; Heart disease in her father; Hypertension in her mother. Social History:  Patient  reports that she has never smoked. She has never used smokeless tobacco. She reports that she does not drink alcohol and does not use drugs.  Review of Systems: Constitutional: Negative for fever malaise or anorexia Cardiovascular: negative for chest pain Respiratory: negative for SOB or persistent cough Gastrointestinal: negative for abdominal pain  Objective  Vitals: BP 128/80   Pulse 89   Temp 97.6 F (36.4 C)   Resp 16   Ht 5' 4"  (1.626 m)   Wt 178 lb (80.7 kg)   SpO2 98%   BMI 30.55 kg/m  General: no acute distress , A&Ox3, appears well HEENT: PEERL, conjunctiva normal, neck is supple Neuro: Grossly normal  Toradol 60 mg IM given in office.  Had mild benefit  Commons side effects, risks, benefits, and alternatives for medications and treatment plan prescribed today were discussed, and the patient expressed understanding of the given instructions. Patient is instructed to call or message via MyChart if he/she has any questions or concerns  regarding our treatment plan. No barriers to understanding were identified. We discussed Red Flag symptoms and signs in detail. Patient expressed understanding regarding what to do in case of urgent or emergency type symptoms.   Medication list was reconciled, printed and provided to the patient in AVS. Patient instructions and summary information was reviewed with the patient as documented in the AVS. This note was prepared with assistance of Dragon voice recognition software. Occasional wrong-word or sound-a-like substitutions may have occurred due to the inherent limitations of voice recognition software  This visit occurred during the SARS-CoV-2 public health emergency.  Safety protocols were in place, including screening questions prior to the visit, additional usage of staff PPE, and extensive cleaning of exam room while observing appropriate contact time as indicated for disinfecting solutions.

## 2020-09-15 ENCOUNTER — Encounter: Payer: Self-pay | Admitting: Family Medicine

## 2020-09-15 DIAGNOSIS — M65312 Trigger thumb, left thumb: Secondary | ICD-10-CM | POA: Diagnosis not present

## 2020-09-15 DIAGNOSIS — M65342 Trigger finger, left ring finger: Secondary | ICD-10-CM | POA: Diagnosis not present

## 2020-09-23 ENCOUNTER — Telehealth: Payer: Self-pay

## 2020-09-23 NOTE — Telephone Encounter (Addendum)
Pt has been r/s from 10/13/20 to 10/27/20 @ 11:00am [facility] TOA 9:30am due to scheduling error.Covid Test has also been r/s. Pt has been informed and is okay with the change. New instructions will be sent to pt via mychart.

## 2020-09-26 ENCOUNTER — Encounter: Payer: Self-pay | Admitting: Physical Therapy

## 2020-09-26 ENCOUNTER — Other Ambulatory Visit: Payer: Self-pay

## 2020-09-26 ENCOUNTER — Ambulatory Visit (INDEPENDENT_AMBULATORY_CARE_PROVIDER_SITE_OTHER): Payer: Medicare Other | Admitting: Physical Therapy

## 2020-09-26 DIAGNOSIS — M545 Low back pain, unspecified: Secondary | ICD-10-CM | POA: Diagnosis not present

## 2020-09-26 DIAGNOSIS — M542 Cervicalgia: Secondary | ICD-10-CM

## 2020-09-26 DIAGNOSIS — G8929 Other chronic pain: Secondary | ICD-10-CM | POA: Diagnosis not present

## 2020-09-26 NOTE — Patient Instructions (Signed)
Access Code: [email or web address] URL: [email or web address] Date: 09/26/2020 Prepared by: Lyndee Hensen  Exercises Seated Cervical Sidebending Stretch - 2 x daily - 3 reps - 30 hold Standing Backward Shoulder Rolls - 2 x daily - 1 sets - 10 reps Seated Scapular Retraction - 2 x daily - 1 sets - 10 reps Supine Single Knee to Chest Stretch - 2 x daily - 3 reps - 30 hold Seated Flexion Stretch - 2 x daily - 3 reps - 30 hold

## 2020-09-26 NOTE — Therapy (Signed)
[facility] [address] [place], [place], [address] Phone: [phone]   Fax:  [phone]  Physical Therapy Evaluation  Patient Details  Name: Margaret White MRN: [id number] Date of Birth: 14-Nov-1950 Referring Provider (PT): Billey Chang   Encounter Date: 09/26/2020   PT End of Session - 09/26/20 1548    Visit Number 1    Number of Visits 12    Date for PT Re-Evaluation 11/07/20    Authorization Type Medicare    PT Start Time 0230    PT Stop Time 0310    PT Time Calculation (min) 40 min    Activity Tolerance Patient tolerated treatment well    Behavior During Therapy [facility] for tasks assessed/performed           Past Medical History:  Diagnosis Date  . Allergic rhinitis   . Anxiety   . Avascular necrosis of hip, right ([place]) 03/24/2018  . Chronic cluster headache   . Chronic sinusitis   . Diabetes mellitus type 2, diet-controlled ([place])    per pt currently no taking metformin, followed by pcp  . Eczema   . Essential hypertension    cardiologsit-- dr dr Bettina Gavia  . Fibromyalgia   . GERD (gastroesophageal reflux disease)   . Hepatic steatosis 10/14/2017  . Hiatal hernia   . 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 History of colon polyps   . History of hyperthyroidism 2005  . History of pneumonia, recurrent   . History of seizure 2006   x1  . IBS (irritable bowel syndrome)   . Mixed hyperlipidemia   . Nodule of lower lobe of left lung 10/2018   8m;   pulmologist-- dr iMeda Coffee bLeory Plowman stable per lov note in epic  . Ocular rosacea   . OSA (obstructive sleep apnea)    per pt intolerant to cpap due to sinus issues,  uses mouth guard   . [place]itis pubis ([place] 11/05/2017   [facility]   . Primary osteoarthritis of both hands 07/10/2017  . Recurrent major depressive disorder, in full remission ([place] 06/26/2017  . RLS (restless legs syndrome)   . Seasonal allergies   . Solitary pulmonary nodule    pulmologist-- dr i. bLeory Plowman, lov note in epic,  stable  . 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 Shallow disc bulging without significant neuroforaminal stenosis.  .Marland KitchenSUI (stress urinary incontinence, female)   . Vitamin B 12 deficiency   . Vitamin D deficiency     Past Surgical History:  Procedure Laterality Date  . ABDOMINOPLASTY  2006  . ANAL FISSURE REPAIR  2006  . BLADDER SURGERY  1984  . BREAST BIOPSY Right     benign    . CARPAL TUNNEL RELEASE Bilateral 1989  . CATARACT EXTRACTION W/ INTRAOCULAR LENS  IMPLANT, BILATERAL  2017  . COLONOSCOPY W/ POLYPECTOMY  2017  . [place] . EYE SURGERY  child    x4  1957-1960  . INCONTINENCE SURGERY    . NASAL SEPTUM SURGERY  1987  . PUBOVAGINAL SLING N/A 03/24/2019  Procedure: CYSTOSCOPY  PUBO-VAGINAL SLING;  Surgeon: Bjorn Loser, MD;  Location[facility];  Service: Urology;  Laterality: N/A;  . RHINOPLASTY  2007  . TOTAL ABDOMINAL HYSTERECTOMY W/ BILATERAL SALPINGOOPHORECTOMY  1984   w/ Marshall-Marchetti (bladder tack suspension)  . TOTAL HIP ARTHROPLASTY Right 04/11/2018   Procedure: RIGHT TOTAL HIP ARTHROPLASTY ANTERIOR APPROACH;  Surgeon: Mcarthur Rossetti, MD;  Location: [facility];  Service: Orthopedics;  Laterality: Right;  . ULNAR NERVE TRANSPOSITION Left 1990s    There were no vitals filed for this visit.    Subjective Assessment - 09/26/20 1439    Subjective Bil neck tension, chronic, with assoicated headaches. Thinks it is tension, stress related. Has not had any therapy thus far. Also has Low back pain, bil  tightness, has had injections. Standing, walking, sitting, sore.  Had R THA, with good outcome.    Pertinent History Fibro, chronic pain in neck and back.    Patient Stated Goals decreased pain    Currently in Pain? Yes    Pain Score 7     Pain Location Neck    Pain Orientation Right;Left    Pain Descriptors / Indicators Tightness    Pain Type Chronic pain    Pain Onset More than a month ago    Pain Frequency Constant              [facility] PT Assessment - 09/26/20 0001      Assessment   Medical Diagnosis Neck pain, headaches, LBP    Referring Provider (PT) Billey Chang    Prior Therapy no      Balance Screen   Has the patient fallen in the past 6 months No      Prior Function   Level of Independence Independent      Cognition   Overall Cognitive Status Within Functional Limits for tasks assessed      ROM / Strength   AROM / PROM / Strength AROM;Strength      AROM   AROM Assessment Site Cervical;Lumbar    Cervical Flexion WFL    Cervical Extension mild/mod limitation pain    Cervical - Right Rotation min limitation/pain    Cervical - Left Rotation min limitation/pain    Lumbar Flexion mod limtation    Lumbar Extension min limitation    Lumbar - Right Side Bend WFL    Lumbar - Left Side Bend [facility]      Strength   Overall Strength Comments Postural: 4-/5;  Hips: 4-5/;  Core: 3-/5      Palpation   Palpation comment Tightness and tenderness in entrire cervical and UT region, into SO.  Lumbar: pain in bil low lumbar, SI, increased lordosis lumbar.                      Objective measurements completed on examination: See above findings.       [facility] Adult PT Treatment/Exercise - 09/26/20 0001      Exercises   Exercises Neck;Lumbar      Lumbar Exercises: Stretches   Single Knee to Chest Stretch 3 reps;30 seconds      Manual Therapy   Manual Therapy Joint mobilization;Soft tissue mobilization;Passive ROM;Manual Traction    Soft tissue mobilization  STM/DTM to bil cervical paraspinals, SO, UTs,    Passive ROM manual UT stretching    Manual Traction cervical: 10 sec x 8;      Neck Exercises: Stretches   Upper Trapezius Stretch 2 reps;30 seconds;Right;Left  Other Neck Stretches shoulder rolls, scap squeeze both x10;                  PT Education - 09/26/20 1548    Education Details PT POC, Exam findings, HEP    Person(s) Educated Patient    Methods Explanation;Demonstration;Tactile cues;Verbal cues;Handout    Comprehension Verbalized understanding;Returned demonstration;Verbal cues required;Tactile cues required;Need further instruction            PT Short Term Goals - 09/26/20 1552      PT SHORT TERM GOAL #1   Title Pt to be independent wtih initial HEP    Time 2    Period Weeks    Status New    Target Date 10/10/20      PT SHORT TERM GOAL #2   Title Pt to demo ability for active TA contraction    Time 2    Period Weeks    Status New    Target Date 10/10/20             PT Long Term Goals - 09/26/20 1553      PT LONG TERM GOAL #1   Title Pt to be independent with final HEP for neck and back    Time 6    Period Weeks    Status New    Target Date 11/07/20      PT LONG TERM GOAL #2   Title Pt to report decreased pain in cervical and UT region to 0-3/10 with activity    Time 6    Period Weeks    Status New    Target Date 11/07/20      PT LONG TERM GOAL #3   Title Pt to report decreased pain in low back to 0-3/10 with activity    Time 6    Period Weeks    Status New    Target Date 11/07/20      PT LONG TERM GOAL #4   Title Pt to demo improved soft tissue limitations to be WNL for improved pain and headaches.    Time 6    Period Weeks    Status New    Target Date 11/07/20                  Plan - 09/26/20 2152    Clinical Impression Statement Pt presents with primary complaint of increased pain in cervical and lumbar region. Pt with chronic neck pain and headaches. Does have  increased muscle tension in cervical musculature. Pt also with limited ROM for lumbar spine, with poor posture and poor strengh of core. She has lack of effective HEP for chronic back pain. Pt with decreased ability for full functional activites, due to pain and dysfunction. Pt to benefit from skilled PT to improve deficits and pain.Pt does live in [place], may need to be transfered there for care if unable to drive to [place].    Personal Factors and Comorbidities Comorbidity 1;Time since onset of injury/illness/exacerbation    Comorbidities Fibromyalgia, multiple pain locations,    Examination-Activity Limitations Locomotion Level;Sleep;Carry;Squat;Stand;Lift    Examination-Participation Restrictions Cleaning;Occupation;Community Activity;Shop;Yard Work;Laundry    Stability/Clinical Decision Making Stable/Uncomplicated    Clinical Decision Making Low    Rehab Potential Good    PT Frequency 2x / week    PT Duration 6 weeks    PT Treatment/Interventions ADLs/Self Care Home Management;Cryotherapy;Electrical Stimulation;DME Instruction;Ultrasound;Traction;Moist Heat;Iontophoresis 48m/ml Dexamethasone;Gait training;Stair training;Functional mobility training;Therapeutic activities;Therapeutic exercise;Balance training;Orthotic Fit/Training;Patient/family education;Neuromuscular re-education;Manual techniques;Passive range of motion;Dry needling;Spinal Manipulations;Joint  Manipulations;Taping;Vasopneumatic Device    Consulted and Agree with Plan of Care Patient           Patient will benefit from skilled therapeutic intervention in order to improve the following deficits and impairments:  Decreased range of motion,Increased muscle spasms,Decreased activity tolerance,Pain,Improper body mechanics,Impaired flexibility,Decreased mobility,Decreased strength  Visit Diagnosis: Cervicalgia  Chronic bilateral low back pain without sciatica     Problem List Patient Active Problem List   Diagnosis  Date Noted  . Colon polyps 08/19/2019  . Chronic cough 12/09/2018  . Incontinence without sensory awareness 05/08/2018  . Mixed incontinence 05/08/2018  . 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 Statin intolerance 03/25/2018  . 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 Bilateral primary osteoarthritis of hip 11/05/2017  . 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 GERD (gastroesophageal reflux disease) 10/14/2017  . Allergic rhinitis 10/14/2017  . NASH (nonalcoholic steatohepatitis) 10/14/2017  . OSA (obstructive sleep apnea) 10/14/2017  . History of cluster headache 10/14/2017  . IBS (irritable bowel syndrome) 10/14/2017  . Fibromyalgia 10/14/2017  . Chronic maxillary sinusitis 10/14/2017  . Pelvic pain 10/14/2017  . Obesity (BMI 30-39.9) 10/14/2017  . Mixed hyperlipidemia 10/14/2017  . Vitamin B12 deficiency 10/14/2017  . Vitamin D deficiency 10/14/2017  . Female cystocele 10/14/2017  . Primary osteoarthritis of both hands 07/10/2017  . Essential hypertension 06/26/2017  . Recurrent major depressive disorder, in full remission ([place]) 06/26/2017   Lyndee Hensen, PT, DPT 10:05 PM  09/26/20    [facility] [place], [place], [address] Phone: [phone]   Fax:  [phone]  Name: Reham Slabaugh MRN: [id number] Date of Birth: Apr 08, 1951

## 2020-10-06 ENCOUNTER — Other Ambulatory Visit: Payer: Self-pay

## 2020-10-06 ENCOUNTER — Encounter: Payer: Self-pay | Admitting: Physical Therapy

## 2020-10-06 ENCOUNTER — Ambulatory Visit (INDEPENDENT_AMBULATORY_CARE_PROVIDER_SITE_OTHER): Payer: Medicare Other | Admitting: Physical Therapy

## 2020-10-06 DIAGNOSIS — M542 Cervicalgia: Secondary | ICD-10-CM

## 2020-10-06 DIAGNOSIS — M545 Low back pain, unspecified: Secondary | ICD-10-CM | POA: Diagnosis not present

## 2020-10-06 DIAGNOSIS — G8929 Other chronic pain: Secondary | ICD-10-CM

## 2020-10-10 ENCOUNTER — Other Ambulatory Visit: Payer: Self-pay

## 2020-10-10 ENCOUNTER — Encounter: Payer: Self-pay | Admitting: Physical Therapy

## 2020-10-10 ENCOUNTER — Other Ambulatory Visit (HOSPITAL_COMMUNITY): Payer: Medicare Other

## 2020-10-10 DIAGNOSIS — G8929 Other chronic pain: Secondary | ICD-10-CM

## 2020-10-10 NOTE — Therapy (Signed)
[place]aret [place], [place], [address] Phone: [phone]   Fax:  [phone]  Physical Therapy Treatment/Discharge   Patient Details  Name: Margaret Margaret MRN: [id number] Date of Birth: 1951-06-27 Referring Provider (PT): Billey Chang   Encounter Date: 10/06/2020   PT End of Session - 10/10/20 1158    Visit Number 2    Number of Visits 12    Date for PT Re-Evaluation 11/07/20    Authorization Type Medicare    PT Start Time 1300    PT Stop Time 1350    PT Time Calculation (min) 50 min    Activity Tolerance Patient tolerated treatment well    Behavior During Therapy [facility] Margaret for tasks assessed/performed           Past Medical History:  Diagnosis Date  . Allergic rhinitis   . Anxiety   . Avascular necrosis of hip, right ([place]) 03/24/2018  . Chronic cluster headache   . Chronic sinusitis   . Diabetes mellitus type 2, diet-controlled ([place])    per pt currently no taking metformin, followed by pcp  . Eczema   . Essential hypertension    cardiologsit-- dr dr Bettina Gavia  . Fibromyalgia   . GERD (gastroesophageal reflux disease)   . Hepatic steatosis 10/14/2017  . Hiatal hernia   . 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 History of colon polyps   . History of hyperthyroidism 2005  . History of pneumonia, recurrent   . History of seizure 2006   x1  . IBS (irritable bowel syndrome)   . Mixed hyperlipidemia   . Nodule of lower lobe of left lung 10/2018   95m;   pulmologist-- dr iMeda Coffee bLeory Plowman stable per Margaret Margaret  . Ocular rosacea   . OSA (obstructive sleep apnea)    per pt intolerant to cpap due to sinus issues,  uses mouth guard   . [place] ([place] 11/05/2017   [facility]   . Primary osteoarthritis of both hands 07/10/2017  . Recurrent major depressive disorder, in full remission ([place] 06/26/2017  . RLS (restless legs syndrome)   . Seasonal allergies   . Solitary pulmonary nodule    pulmologist-- dr i. bLeory Plowman, Margaret Margaret,  stable  . 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 Shallow disc bulging without significant neuroforaminal stenosis.  .Margaret Margaret (stress urinary incontinence, female)   . Vitamin B 12 deficiency   . Vitamin D deficiency     Past Surgical History:  Procedure Laterality Date  . ABDOMINOPLASTY  2006  . ANAL FISSURE REPAIR  2006  . BLADDER SURGERY  1984  . BREAST BIOPSY Right     benign    . CARPAL TUNNEL RELEASE Bilateral 1989  . CATARACT EXTRACTION W/ INTRAOCULAR LENS  IMPLANT, BILATERAL  2017  . COLONOSCOPY W/ POLYPECTOMY  2017  . [place] . EYE SURGERY  child    x4  1957-1960  . INCONTINENCE SURGERY    . NASAL SEPTUM SURGERY  1987  . PUBOVAGINAL SLING N/A 03/24/2019  Procedure: CYSTOSCOPY  PUBO-VAGINAL SLING;  Surgeon: Margaret Loser, MD;  Location: [facility];  Service: Urology;  Laterality: N/A;  . RHINOPLASTY  2007  . TOTAL ABDOMINAL HYSTERECTOMY W/ BILATERAL SALPINGOOPHORECTOMY  1984   w/ Marshall-Marchetti (bladder tack suspension)  . TOTAL HIP ARTHROPLASTY Right 04/11/2018   Procedure: RIGHT TOTAL HIP ARTHROPLASTY ANTERIOR APPROACH;  Surgeon: Margaret Rossetti, MD;  [facility];  Service: Orthopedics;  Laterality: Right;  . ULNAR NERVE TRANSPOSITION Left 1990s    There were no vitals filed for this visit.   Subjective Assessment - 10/10/20 1157    Subjective Pt states slightly less pain today since she has not been working/stressed this week.    Currently in Pain? Yes    Pain Score 5     Pain  Location Neck    Pain Orientation Right;Left    Pain Descriptors / Indicators Aching;Tightness    Pain Type Chronic pain    Pain Onset More than a month ago    Pain Frequency Intermittent                             [facility] Adult PT Treatment/Exercise - 10/10/20 0001      Exercises   Exercises Neck;Lumbar      Neck Exercises: Standing   Other Standing Exercises shoulder flexion AROM with educatoin on shoulder posture.      Neck Exercises: Seated   Cervical Rotation 10 reps      Lumbar Exercises: Stretches   Single Knee to Chest Stretch 3 reps;30 seconds      Lumbar Exercises: Standing   Row 20 reps    Theraband Level (Row) Level 3 (Green)      Modalities   Modalities Moist Heat      Moist Heat Therapy   Number Minutes Moist Heat 10 Minutes    Moist Heat Location Cervical      Manual Therapy   Manual Therapy Joint mobilization;Soft tissue mobilization;Passive ROM;Manual Traction    Manual therapy comments skilled palpation and monitoring of soft tissue with dry needling.    Soft tissue mobilization STM/DTM to bil cervical paraspinals, SO, UTs,    Passive ROM manual UT stretching    Manual Traction cervical: 10 sec x 10;      Neck Exercises: Stretches   Upper Trapezius Stretch 2 reps;30 seconds;Right;Left    Other Neck Stretches shoulder rolls, scap squeeze both x10;            Trigger Point Dry Needling - 10/10/20 0001    Consent Given? Yes    Education Handout Provided Yes    Muscles Treated Head and Neck Upper trapezius;Levator scapulae    Upper Trapezius Response Twitch reponse elicited;Palpable increased muscle length   R   Levator Scapulae Response Twitch response elicited;Palpable increased muscle length   R               PT Education - 10/10/20 1157    Education Details On dry needling, reviewed HEP    Person(s) Educated Patient    Methods Explanation;Demonstration;Tactile cues;Verbal cues;Handout    Comprehension Verbalized  understanding;Returned demonstration;Verbal cues required;Tactile cues required            PT Short Term Goals - 09/26/20 1552      PT SHORT TERM GOAL #1   Title Pt to be independent wtih initial HEP    Time 2    Period Weeks    Status  New    Target Date 10/10/20      PT SHORT TERM GOAL #2   Title Pt to demo ability for active TA contraction    Time 2    Period Weeks    Status New    Target Date 10/10/20             PT Long Term Goals - 09/26/20 1553      PT LONG TERM GOAL #1   Title Pt to be independent with final HEP for neck and back    Time 6    Period Weeks    Status New    Target Date 11/07/20      PT LONG TERM GOAL #2   Title Pt to report decreased pain in cervical and UT region to 0-3/10 with activity    Time 6    Period Weeks    Status New    Target Date 11/07/20      PT LONG TERM GOAL #3   Title Pt to report decreased pain in low back to 0-3/10 with activity    Time 6    Period Weeks    Status New    Target Date 11/07/20      PT LONG TERM GOAL #4   Title Pt to demo improved soft tissue limitations to be WNL for improved pain and headaches.    Time 6    Period Weeks    Status New    Target Date 11/07/20                 Plan - 10/10/20 1200    Clinical Impression Statement Focus on muscle tension today, with manual and dry needling. Pt wtih good tolerance. Heat done at end of session for soreness and muscle tension. Discussed home use of heat as well. Pt will benefit from continued care, but does live in [place]. Discussed options with pt, she requests switch to PT office closer to home. Referral info will be sent to new office.    Personal Factors and Comorbidities Comorbidity 1;Time since onset of injury/illness/exacerbation    Comorbidities Fibromyalgia, multiple pain locations,    Examination-Activity Limitations Locomotion Level;Sleep;Carry;Squat;Stand;Lift    Examination-Participation Restrictions Cleaning;Occupation;Community  Activity;Shop;Yard Work;Laundry    Stability/Clinical Decision Making Stable/Uncomplicated    Rehab Potential Good    PT Frequency 2x / week    PT Duration 6 weeks    PT Treatment/Interventions ADLs/Self Care Home Management;Cryotherapy;Electrical Stimulation;DME Instruction;Ultrasound;Traction;Moist Heat;Iontophoresis 68m/ml Dexamethasone;Gait training;Stair training;Functional mobility training;Therapeutic activities;Therapeutic exercise;Balance training;Orthotic Fit/Training;Patient/family education;Neuromuscular re-education;Manual techniques;Passive range of motion;Dry needling;Spinal Manipulations;Joint Manipulations;Taping;Vasopneumatic Device    Consulted and Agree with Plan of Care Patient           Patient will benefit from skilled therapeutic intervention in order to improve the following deficits and impairments:  Decreased range of motion,Increased muscle spasms,Decreased activity tolerance,Pain,Improper body mechanics,Impaired flexibility,Decreased mobility,Decreased strength  Visit Diagnosis: Cervicalgia  Chronic bilateral low back pain without sciatica     Problem List Patient Active Problem List   Diagnosis Date Noted  . Colon polyps 08/19/2019  . Chronic cough 12/09/2018  . Incontinence without sensory awareness 05/08/2018  . Mixed incontinence 05/08/2018  . 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 Statin intolerance 03/25/2018  . 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 Bilateral primary osteoarthritis of hip 11/05/2017  . 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 GERD (gastroesophageal reflux disease) 10/14/2017  . Allergic rhinitis 10/14/2017  .  NASH (nonalcoholic steatohepatitis) 10/14/2017  . OSA (obstructive sleep apnea) 10/14/2017  . History of cluster headache 10/14/2017  . IBS (irritable bowel syndrome) 10/14/2017  . Fibromyalgia 10/14/2017  . Chronic maxillary sinusitis  10/14/2017  . Pelvic pain 10/14/2017  . Obesity (BMI 30-39.9) 10/14/2017  . Mixed hyperlipidemia 10/14/2017  . Vitamin B12 deficiency 10/14/2017  . Vitamin D deficiency 10/14/2017  . Female cystocele 10/14/2017  . Primary osteoarthritis of both hands 07/10/2017  . Essential hypertension 06/26/2017  . Recurrent major depressive disorder, in full remission ([place]) 06/26/2017    Lyndee Hensen 10/10/2020, 12:06 PM  [facility] [address] [place], [place], [address] Phone: [phone]   Fax:  [phone]  Name: Nakyra Bourn MRN: [id number] Date of Birth: 1951-02-05     PHYSICAL THERAPY DISCHARGE SUMMARY  Visits from Start of Care: 2  Plan: Patient agrees to discharge.  Patient goals were not met. Patient is being discharged due to the patient's request.  ?????    Pt request to attend clinic closer to her home.   Lyndee Hensen, PT, DPT 12:06 PM  10/10/20

## 2020-10-21 ENCOUNTER — Other Ambulatory Visit: Payer: Self-pay | Admitting: Family Medicine

## 2020-10-21 DIAGNOSIS — I10 Essential (primary) hypertension: Secondary | ICD-10-CM

## 2020-10-24 ENCOUNTER — Other Ambulatory Visit (HOSPITAL_COMMUNITY): Payer: Medicare Other

## 2020-10-25 ENCOUNTER — Other Ambulatory Visit: Payer: Self-pay

## 2020-10-25 ENCOUNTER — Encounter (HOSPITAL_COMMUNITY): Payer: Self-pay | Admitting: Gastroenterology

## 2020-10-25 ENCOUNTER — Other Ambulatory Visit (HOSPITAL_COMMUNITY)
Admission: RE | Admit: 2020-10-25 | Discharge: 2020-10-25 | Disposition: A | Discharge status: Home / Self Care | Payer: Medicare Other | Source: Ambulatory Visit | Attending: Gastroenterology | Admitting: Gastroenterology

## 2020-10-25 DIAGNOSIS — Z01818 Encounter for other preprocedural examination: Secondary | ICD-10-CM | POA: Diagnosis not present

## 2020-10-25 DIAGNOSIS — Z20822 Contact with and (suspected) exposure to covid-19: Secondary | ICD-10-CM | POA: Insufficient documentation

## 2020-10-25 LAB — SARS CORONAVIRUS 2 (TAT 6-24 HRS): SARS Coronavirus 2: NEGATIVE

## 2020-10-27 ENCOUNTER — Ambulatory Visit (HOSPITAL_COMMUNITY)
Admission: RE | Admit: 2020-10-27 | Discharge: 2020-10-27 | Disposition: A | Discharge status: Home / Self Care | Payer: Medicare Other | Attending: Gastroenterology | Admitting: Gastroenterology

## 2020-10-27 ENCOUNTER — Ambulatory Visit (HOSPITAL_COMMUNITY): Payer: Medicare Other | Admitting: Certified Registered Nurse Anesthetist

## 2020-10-27 ENCOUNTER — Encounter (HOSPITAL_COMMUNITY): Payer: Self-pay | Admitting: Gastroenterology

## 2020-10-27 ENCOUNTER — Other Ambulatory Visit: Payer: Self-pay | Admitting: Physician Assistant

## 2020-10-27 ENCOUNTER — Other Ambulatory Visit: Payer: Self-pay

## 2020-10-27 ENCOUNTER — Encounter (HOSPITAL_COMMUNITY)
Admission: RE | Disposition: A | Discharge status: Home / Self Care | Payer: Self-pay | Source: Home / Self Care | Attending: Gastroenterology

## 2020-10-27 DIAGNOSIS — R140 Abdominal distension (gaseous): Secondary | ICD-10-CM | POA: Insufficient documentation

## 2020-10-27 DIAGNOSIS — R748 Abnormal levels of other serum enzymes: Secondary | ICD-10-CM

## 2020-10-27 DIAGNOSIS — K839 Disease of biliary tract, unspecified: Secondary | ICD-10-CM

## 2020-10-27 DIAGNOSIS — R1013 Epigastric pain: Secondary | ICD-10-CM | POA: Insufficient documentation

## 2020-10-27 DIAGNOSIS — K3189 Other diseases of stomach and duodenum: Secondary | ICD-10-CM | POA: Diagnosis not present

## 2020-10-27 DIAGNOSIS — Z96641 Presence of right artificial hip joint: Secondary | ICD-10-CM | POA: Diagnosis not present

## 2020-10-27 DIAGNOSIS — K2289 Other specified disease of esophagus: Secondary | ICD-10-CM | POA: Insufficient documentation

## 2020-10-27 DIAGNOSIS — Z882 Allergy status to sulfonamides status: Secondary | ICD-10-CM | POA: Insufficient documentation

## 2020-10-27 DIAGNOSIS — D72820 Lymphocytosis (symptomatic): Secondary | ICD-10-CM | POA: Insufficient documentation

## 2020-10-27 DIAGNOSIS — K838 Other specified diseases of biliary tract: Secondary | ICD-10-CM | POA: Diagnosis not present

## 2020-10-27 DIAGNOSIS — Z885 Allergy status to narcotic agent status: Secondary | ICD-10-CM | POA: Diagnosis not present

## 2020-10-27 DIAGNOSIS — K7581 Nonalcoholic steatohepatitis (NASH): Secondary | ICD-10-CM

## 2020-10-27 DIAGNOSIS — K6389 Other specified diseases of intestine: Secondary | ICD-10-CM

## 2020-10-27 DIAGNOSIS — I899 Noninfective disorder of lymphatic vessels and lymph nodes, unspecified: Secondary | ICD-10-CM | POA: Diagnosis not present

## 2020-10-27 HISTORY — PX: BIOPSY: SHX5522

## 2020-10-27 HISTORY — PX: ESOPHAGOGASTRODUODENOSCOPY (EGD) WITH PROPOFOL: SHX5813

## 2020-10-27 HISTORY — PX: UPPER ESOPHAGEAL ENDOSCOPIC ULTRASOUND (EUS): SHX6562

## 2020-10-27 HISTORY — DX: Post-traumatic stress disorder, unspecified: F43.10

## 2020-10-27 HISTORY — DX: Fatty (change of) liver, not elsewhere classified: K76.0

## 2020-10-27 SURGERY — ESOPHAGOGASTRODUODENOSCOPY (EGD) WITH PROPOFOL
Anesthesia: Monitor Anesthesia Care

## 2020-10-27 MED ORDER — PROPOFOL 10 MG/ML IV BOLUS
INTRAVENOUS | Status: DC | PRN
  Administered 2020-10-27: 20 mg via INTRAVENOUS
  Administered 2020-10-27: 25 mg via INTRAVENOUS
  Administered 2020-10-27: 20 mg via INTRAVENOUS
  Administered 2020-10-27: 25 mg via INTRAVENOUS

## 2020-10-27 MED ORDER — SODIUM CHLORIDE 0.9 % IV SOLN: INTRAVENOUS | Status: DC

## 2020-10-27 MED ORDER — LACTATED RINGERS IV SOLN: INTRAVENOUS | Status: DC

## 2020-10-27 MED ORDER — LIDOCAINE 2% (20 MG/ML) 5 ML SYRINGE
INTRAMUSCULAR | Status: DC | PRN
  Administered 2020-10-27: 60 mg via INTRAVENOUS

## 2020-10-27 MED ORDER — PROPOFOL 500 MG/50ML IV EMUL
INTRAVENOUS | Status: DC | PRN
  Administered 2020-10-27: 100 ug/kg/min via INTRAVENOUS

## 2020-10-27 SURGICAL SUPPLY — 14 items

## 2020-10-27 NOTE — Anesthesia Procedure Notes (Signed)
Procedure Name: MAC Date/Time: 10/27/2020 11:01 AM Performed by: Colin Benton, CRNA Pre-anesthesia Checklist: Patient identified, Emergency Drugs available, Suction available and Patient being monitored Patient Re-evaluated:Patient Re-evaluated prior to induction Oxygen Delivery Method: Nasal cannula Induction Type: IV induction Airway Equipment and Method: Bite block Placement Confirmation: positive ETCO2 Dental Injury: Teeth and Oropharynx as per pre-operative assessment

## 2020-10-27 NOTE — Op Note (Signed)
[facility] Patient Name: Margaret White Procedure Date : 10/27/2020 MRN: [id number] Attending MD: Justice Britain , MD Date of Birth: [birth year] CSN: [id number] Age: 70 Admit Type: Outpatient Procedure:                Upper EUS Indications:              Common bile duct dilation (acquired) seen on MRCP,                            Dyspepsia, Heartburn, Abdominal bloating Providers:                Justice Britain, MD, Baird Cancer, RN, Elspeth Cho Tech., Technician, Pearline Cables, CRNA Referring MD:             Thornton Park MD, MD, [place] Jonni Sanger MD Medicines:                Monitored Anesthesia Care Complications:            No immediate complications. Estimated Blood Loss:     Estimated blood loss was minimal. Procedure:                Pre-Anesthesia Assessment:                           - Prior to the procedure, a History and Physical                            was performed, and patient medications and                            allergies were reviewed. The patient's tolerance of                            previous anesthesia was also reviewed. The risks                            and benefits of the procedure and the sedation                            options and risks were discussed with the patient.                            All questions were answered, and informed consent                            was obtained. Prior Anticoagulants: The patient has                            taken no previous anticoagulant or antiplatelet                            agents. ASA Grade Assessment: II - A patient with  mild systemic disease. After reviewing the risks                            and benefits, the patient was deemed in                            satisfactory condition to undergo the procedure.                           After obtaining informed consent, the endoscope was                             passed under direct vision. Throughout the                            procedure, the patient's blood pressure, pulse, and                            oxygen saturations were monitored continuously. The                            [id number]-H190 [id number]) Olympus gastroscope was                            introduced through the mouth, and advanced to the                            second part of duodenum. The [id number]-Q180V ([id number])                            Olympus Duodenoscope was introduced through the                            mouth, and advanced to the second part of duodenum.                            The [id number] ([id number]) Olympus Linear EUS scope                            was introduced through the mouth, and advanced to                            the duodenum for ultrasound examination from the                            stomach and duodenum. The upper EUS was                            accomplished without difficulty. The patient                            tolerated the procedure. Scope In: Scope Out: Findings:      ENDOSCOPIC FINDING: :      White nummular lesions were noted  in the entire esophagus. Biopsies were       taken with a cold forceps for histology to rule out Candida.      The Z-line was regular and was found 37 cm from the incisors.      Patchy mildly erythematous mucosa without bleeding was found in the       gastric body and in the gastric antrum.      No other gross lesions were noted in the entire examined stomach.       Biopsies were taken with a cold forceps for histology and Helicobacter       pylori testing.      No gross lesions were noted in the duodenal bulb, in the first portion       of the duodenum and in the second portion of the duodenum. Biopsies were       taken with a cold forceps for histology.      The major papilla was normal.      ENDOSONOGRAPHIC FINDING: :      There was common bile duct was normal at the ampulla but enlarged and       dilated at  the transition into the common hepatic duct (2.6 mm -> 4.0 mm       -> 8.6 mm -> 10.8 mm). No evidence of any stones or sludge or strictures       noted within the biliary tree.      A small amount of hyperechoic material consistent with sludge was       visualized endosonographically in the gallbladder.      There was no sign of significant endosonographic abnormality in the       pancreatic head (PD = 0.9 mm), genu of the pancreas (PD = 1.6 mm -> 1.2       mm), pancreatic body (PD = 1.1 mm) and pancreatic tail (PD = 0.6 mm). No       masses, no cysts, no calcifications, the pancreatic duct was regular in       contour throughout.      There was no sign of significant endosonographic abnormality in the       ampulla. No masses and no wall thickening were identified.      No malignant-appearing lymph nodes were visualized in the celiac region       (level 20), peripancreatic region and porta hepatis region.      The celiac region was visualized. Impression:               EGD Impression:                           - White nummular lesions in esophageal mucosa.                            Biopsied.                           - Z-line regular, 37 cm from the incisors.                           - Erythematous mucosa in the gastric body and                            antrum.  No other gross lesions in the stomach.                            Biopsied.                           - No gross lesions in the duodenal bulb, in the                            first portion of the duodenum and in the second                            portion of the duodenum. Biopsied.                           - Normal major papilla.                           EUS Impression:                           - There was a gradula dilation in the common bile                            duct into the transition of the common hepatic                            duct. No evidence of a mass or stone or sludge or                             stricture within the bile duct to account for                            dilation in biliary tree.                           - Minimal amount of hyperechoic material consistent                            with sludge was visualized endosonographically in                            the gallbladder.                           - There was no sign of significant pathology in the                            pancreatic head, genu of the pancreas, pancreatic                            body and pancreatic tail. No mass or lesion or  ductal dilation noted.                           - There was no sign of significant pathology at the                            ampulla.                           - No malignant-appearing lymph nodes were                            visualized in the celiac region (level 20),                            peripancreatic region and porta hepatis region. Recommendation:           - The patient will be observed post-procedure,                            until all discharge criteria are met.                           - Discharge patient to home.                           - Patient has a contact number available for                            emergencies. The signs and symptoms of potential                            delayed complications were discussed with the                            patient. Return to normal activities tomorrow.                            Written discharge instructions were provided to the                            patient.                           - Observe patient's clinical course.                           - Await path results.                           - Return to referring physician as previously                            scheduled.                           - The findings and recommendations were discussed  with the patient.                           - The findings and recommendations were  discussed                            with the patient's family. Procedure Code(s):        --- Professional ---                           [phone], Esophagogastroduodenoscopy, flexible,                            transoral; with endoscopic ultrasound examination                            limited to the esophagus, stomach or duodenum, and                            adjacent structures                           43239, Esophagogastroduodenoscopy, flexible,                            transoral; with biopsy, single or multiple Diagnosis Code(s):        --- Professional ---                           K22.8, Other specified diseases of esophagus                           K31.89, Other diseases of stomach and duodenum                           K83.8, Other specified diseases of biliary tract                           I89.9, Noninfective disorder of lymphatic vessels                            and lymph nodes, unspecified                           R10.13, Epigastric pain                           R12, Heartburn                           R14.0, Abdominal distension (gaseous) CPT copyright 2019 American Medical Association. All rights reserved. The codes documented in this report are preliminary and upon coder review may  be revised to meet current compliance requirements. Justice Britain, MD 10/27/2020 12:05:01 PM Number of Addenda: 0

## 2020-10-27 NOTE — Anesthesia Preprocedure Evaluation (Signed)
Anesthesia Evaluation  Patient identified by MRN, date of birth, ID band Patient awake    Reviewed: Allergy & Precautions, H&P , NPO status , Patient's Chart, lab work & pertinent test results  Airway Mallampati: I  TM Distance: >3 FB Neck ROM: Full    Dental no notable dental hx. (+) Teeth Intact, Dental Advisory Given   Pulmonary asthma (no inhalers, only asthmatic when sick) , sleep apnea (uses mouthguard) ,    Pulmonary exam normal breath sounds clear to auscultation       Cardiovascular hypertension, Pt. on medications Normal cardiovascular exam Rhythm:Regular Rate:Normal     Neuro/Psych  Headaches, Seizures -, Well Controlled,  PSYCHIATRIC DISORDERS Anxiety Depression    GI/Hepatic Neg liver ROS, hiatal hernia, GERD  Medicated and Controlled,Dyspepsia, abn bile duct, elevated LFTs   Endo/Other  neg diabetes  Renal/GU negative Renal ROS  negative genitourinary   Musculoskeletal  (+) Arthritis , Osteoarthritis,    Abdominal   Peds negative pediatric ROS (+)  Hematology negative hematology ROS (+)   Anesthesia Other Findings   Reproductive/Obstetrics negative OB ROS                             Anesthesia Physical Anesthesia Plan  ASA: II  Anesthesia Plan: MAC   Post-op Pain Management:    Induction:   PONV Risk Score and Plan: 2 and Propofol infusion and TIVA  Airway Management Planned: Natural Airway and Simple Face Mask  Additional Equipment: None  Intra-op Plan:   Post-operative Plan:   Informed Consent: I have reviewed the patients History and Physical, chart, labs and discussed the procedure including the risks, benefits and alternatives for the proposed anesthesia with the patient or authorized representative who has indicated his/her understanding and acceptance.       Plan Discussed with: CRNA  Anesthesia Plan Comments:         Anesthesia Quick  Evaluation

## 2020-10-27 NOTE — Discharge Instructions (Signed)
YOU HAD AN ENDOSCOPIC PROCEDURE TODAY: Refer to the procedure report and other information in the discharge instructions given to you for any specific questions about what was found during the examination. If this information does not answer your questions, please call [place] office at [phone] to clarify.   YOU SHOULD EXPECT: Some feelings of bloating in the abdomen. Passage of more gas than usual. Walking can help get rid of the air that was put into your GI tract during the procedure and reduce the bloating. If you had a lower endoscopy (such as a colonoscopy or flexible sigmoidoscopy) you may notice spotting of blood in your stool or on the toilet paper. Some abdominal soreness may be present for a day or two, also.  DIET: Your first meal following the procedure should be a light meal and then it is ok to progress to your normal diet. A half-sandwich or bowl of soup is an example of a good first meal. Heavy or fried foods are harder to digest and may make you feel nauseous or bloated. Drink plenty of fluids but you should avoid alcoholic beverages for 24 hours. If you had a esophageal dilation, please see attached instructions for diet.    ACTIVITY: Your care partner should take you home directly after the procedure. You should plan to take it easy, moving slowly for the rest of the day. You can resume normal activity the day after the procedure however YOU SHOULD NOT DRIVE, use power tools, machinery or perform tasks that involve climbing or major physical exertion for 24 hours (because of the sedation medicines used during the test).   SYMPTOMS TO REPORT IMMEDIATELY: A gastroenterologist can be reached at any hour. Please call [phone]  for any of the following symptoms:   Following upper endoscopy (EGD, EUS, ERCP, esophageal dilation) Vomiting of blood or coffee ground material  New, significant abdominal pain  New, significant chest pain or pain under the shoulder blades  Painful or  persistently difficult swallowing  New shortness of breath  Black, tarry-looking or red, bloody stools  FOLLOW UP:  If any biopsies were taken you will be contacted by phone or by letter within the next 1-3 weeks. Call [phone]  if you have not heard about the biopsies in 3 weeks.  Please also call with any specific questions about appointments or follow up tests.  

## 2020-10-27 NOTE — Transfer of Care (Signed)
Immediate Anesthesia Transfer of Care Note  Patient: Margaret White  Procedure(s) Performed: ESOPHAGOGASTRODUODENOSCOPY (EGD) WITH PROPOFOL (N/A ) UPPER ESOPHAGEAL ENDOSCOPIC ULTRASOUND (EUS) (N/A ) BIOPSY  Patient Location: Endoscopy Unit  Anesthesia Type:MAC  Level of Consciousness: awake, alert , oriented and patient cooperative  Airway & Oxygen Therapy: Patient Spontanous Breathing and Patient connected to nasal cannula oxygen  Post-op Assessment: Report given to RN and Post -op Vital signs reviewed and stable  Post vital signs: Reviewed and stable  Last Vitals:  Vitals Value Taken Time  BP    Temp    Pulse    Resp    SpO2      Last Pain:  Vitals:   10/27/20 0919  TempSrc: Oral  PainSc: 0-No pain         Complications: No complications documented.

## 2020-10-27 NOTE — Anesthesia Postprocedure Evaluation (Signed)
Anesthesia Post Note  Patient: Margaret White  Procedure(s) Performed: ESOPHAGOGASTRODUODENOSCOPY (EGD) WITH PROPOFOL (N/A ) UPPER ESOPHAGEAL ENDOSCOPIC ULTRASOUND (EUS) (N/A ) BIOPSY     Patient location during evaluation: PACU Anesthesia Type: MAC Level of consciousness: awake and alert Pain management: pain level controlled Vital Signs Assessment: post-procedure vital signs reviewed and stable Respiratory status: spontaneous breathing, nonlabored ventilation and respiratory function stable Cardiovascular status: blood pressure returned to baseline and stable Postop Assessment: no apparent nausea or vomiting Anesthetic complications: no   No complications documented.  Last Vitals:  Vitals:   10/27/20 1202 10/27/20 1212  BP: 115/75 105/86  Pulse: 63 65  Resp: 11 15  Temp: 36.4 C   SpO2: 99% 97%    Last Pain:  Vitals:   10/27/20 1212  TempSrc:   PainSc: 0-No pain                 Pervis Hocking

## 2020-10-27 NOTE — H&P (Signed)
GASTROENTEROLOGY PROCEDURE H&P NOTE   Primary Care Physician: Leamon Arnt, MD  HPI: Margaret White is a 70 y.o. female who presents for EGD/EUS for evaluation of biliary duct dilation NOS.  Past Medical History:  Diagnosis Date  . Allergic rhinitis   . Anxiety   . Asthma   . Avascular necrosis of hip, right ([place]) 03/24/2018  . Chronic cluster headache   . Chronic sinusitis   . Diabetes mellitus type 2, diet-controlled ([place])    per pt currently no taking metformin, followed by pcp  . Eczema   . Essential hypertension    cardiologsit-- dr dr Bettina Gavia  . Fatty liver   . Fibromyalgia   . GERD (gastroesophageal reflux disease)   . Hepatic steatosis 10/14/2017  . Hiatal hernia   . 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 History of colon polyps   . History of hyperthyroidism 2005  . History of pneumonia, recurrent   . History of seizure 2006   x1  . IBS (irritable bowel syndrome)   . IBS (irritable bowel syndrome)   . Mixed hyperlipidemia   . Nodule of lower lobe of left lung 10/2018   24m;   pulmologist-- dr iMeda Coffee bLeory Plowman stable per lov note in epic  . Ocular rosacea   . OSA (obstructive sleep apnea)    per pt intolerant to cpap due to sinus issues,  uses mouth guard   . 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 Pneumonia   . Primary osteoarthritis of both hands 07/10/2017  . PTSD (post-traumatic stress disorder)   . Recurrent major depressive disorder, in full remission ([place] 06/26/2017  . RLS (restless legs syndrome)   . Seasonal allergies   . Seizure ([place] 2006   x 1  . Solitary pulmonary nodule    pulmologist-- dr i. bLeory Plowman, lov note in epic,  stable  . Spondylosis  of lumbar region without myelopathy or radiculopathy 11/04/2017   MRI 11/01/2017: Severe L4-5 facet arthrosis on the right greater than left. Shallow disc bulging without significant neuroforaminal stenosis.  .Marland KitchenSUI (stress urinary incontinence, female)   . Vitamin B 12 deficiency   . Vitamin D deficiency    Past Surgical History:  Procedure Laterality Date  . ABDOMINOPLASTY  2006  . ANAL FISSURE REPAIR  2006  . BLADDER SURGERY  1984  . BREAST BIOPSY Right     benign    . CARPAL TUNNEL RELEASE Bilateral 1989  . CATARACT EXTRACTION W/ INTRAOCULAR LENS  IMPLANT, BILATERAL  2017  . COLONOSCOPY W/ POLYPECTOMY  2017  . [place] . EYE SURGERY  child    x4  1957-1960  . INCONTINENCE SURGERY    . NASAL SEPTUM SURGERY  1987  . PUBOVAGINAL SLING N/A 03/24/2019   Procedure: CYSTOSCOPY  PGaynelle Arabian  Surgeon: MBjorn Loser MD;  [facility]  Service: Urology;  Laterality: N/A;  . REVISION TOTAL HIP ARTHROPLASTY Right   . RHINOPLASTY  2007  . TOTAL ABDOMINAL HYSTERECTOMY W/ BILATERAL SALPINGOOPHORECTOMY  1984   w/ Marshall-Marchetti (bladder tack suspension)  . TOTAL HIP ARTHROPLASTY Right 04/11/2018   Procedure: RIGHT TOTAL HIP  ARTHROPLASTY ANTERIOR APPROACH;  Surgeon: Mcarthur Rossetti, MD;  Location: [facility];  Service: Orthopedics;  Laterality: Right;  . ULNAR NERVE TRANSPOSITION Left 1990s   Current Facility-Administered Medications  Medication Dose Route Frequency Provider Last Rate Last Admin  . lactated ringers infusion   Intravenous Continuous Mansouraty, Telford Nab., MD       Allergies  Allergen Reactions  . Codeine Itching  . Sulfa Antibiotics Rash and Other (See Comments)    Flu like symptoms   Family History  Problem Relation Age of Onset  . Hypertension Mother   . Cancer Father   . Heart disease Father   . Colon cancer Neg Hx   . Esophageal cancer Neg Hx   . Rectal cancer Neg Hx   . Stomach cancer Neg Hx     Social History   Socioeconomic History  . Marital status: Married    Spouse name: Not on file  . Number of children: Not on file  . Years of education: Not on file  . Highest education level: Not on file  Occupational History  . Occupation: Product manager: [employer]  Tobacco Use  . Smoking status: Never Smoker  . Smokeless tobacco: Never Used  Vaping Use  . Vaping Use: Never used  Substance and Sexual Activity  . Alcohol use: No  . Drug use: Never  . Sexual activity: Yes    Birth control/protection: Surgical  Other Topics Concern  . Not on file  Social History Narrative  . Not on file   Social Determinants of Health   Financial Resource Strain: Not on file  Food Insecurity: Not on file  Transportation Needs: Not on file  Physical Activity: Not on file  Stress: Not on file  Social Connections: Not on file  Intimate Partner Violence: Not on file    Physical Exam: Vital signs in last 24 hours: Temp:  [98.3 F (36.8 C)] 98.3 F (36.8 C) (01/20 0919) Pulse Rate:  [93] 93 (01/20 0919) Resp:  [17] 17 (01/20 0919) BP: (127)/(86) 127/86 (01/20 0919) SpO2:  [96 %] 96 % (01/20 0919) Weight:  [76.7 kg] 76.7 kg (01/20 0919)   GEN: NAD EYE: Sclerae anicteric ENT: MMM CV: Non-tachycardic GI: Soft, NT/ND NEURO:  Alert & Oriented x 3  Lab Results: No results for input(s): WBC, HGB, HCT, PLT in the last 72 hours. BMET No results for input(s): NA, K, CL, CO2, GLUCOSE, BUN, CREATININE, CALCIUM in the last 72 hours. LFT No results for input(s): PROT, ALBUMIN, AST, ALT, ALKPHOS, BILITOT, BILIDIR, IBILI in the last 72 hours. PT/INR No results for input(s): LABPROT, INR in the last 72 hours.   Impression / Plan: This is a 70 y.o.female who presents for EGD/EUS for evaluation of biliary duct dilation NOS.  The risks of an EUS including intestinal perforation, bleeding, infection, aspiration, and medication effects were discussed as was the possibility  it may not give a definitive diagnosis if a biopsy is performed.  When a biopsy of the pancreas is done as part of the EUS, there is an additional risk of pancreatitis at the rate of about 1-2%.  It was explained that procedure related pancreatitis is typically mild, although it can be severe and even life threatening, which is why we do not perform random pancreatic biopsies and only biopsy a lesion/area we feel is concerning enough to warrant the risk.  The risks and benefits of endoscopic evaluation were discussed with the patient; these include but are  not limited to the risk of perforation, infection, bleeding, missed lesions, lack of diagnosis, severe illness requiring hospitalization, as well as anesthesia and sedation related illnesses.  The patient is agreeable to proceed.    Justice Britain, MD [facility] Office # [phone]

## 2020-10-28 LAB — SURGICAL PATHOLOGY

## 2020-10-31 ENCOUNTER — Encounter (HOSPITAL_COMMUNITY): Payer: Self-pay | Admitting: Gastroenterology

## 2020-10-31 DIAGNOSIS — M546 Pain in thoracic spine: Secondary | ICD-10-CM | POA: Diagnosis not present

## 2020-10-31 DIAGNOSIS — M542 Cervicalgia: Secondary | ICD-10-CM | POA: Diagnosis not present

## 2020-10-31 DIAGNOSIS — G4486 Cervicogenic headache: Secondary | ICD-10-CM | POA: Diagnosis not present

## 2020-10-31 DIAGNOSIS — M5489 Other dorsalgia: Secondary | ICD-10-CM | POA: Diagnosis not present

## 2020-11-04 ENCOUNTER — Telehealth: Payer: Self-pay | Admitting: Gastroenterology

## 2020-11-04 NOTE — Telephone Encounter (Signed)
Pt is requesting a call back from a nurse in regards to her pathology report

## 2020-11-04 NOTE — Telephone Encounter (Signed)
The pt has been advised that the results have not been reviewed by Dr Rush Landmark os of today.  She will be notified as soon as we have results.

## 2020-11-07 ENCOUNTER — Other Ambulatory Visit: Payer: Self-pay

## 2020-11-07 ENCOUNTER — Encounter: Payer: Self-pay | Admitting: Gastroenterology

## 2020-11-07 DIAGNOSIS — D72820 Lymphocytosis (symptomatic): Secondary | ICD-10-CM

## 2020-11-11 ENCOUNTER — Other Ambulatory Visit: Payer: Medicare Other

## 2020-11-11 DIAGNOSIS — D72820 Lymphocytosis (symptomatic): Secondary | ICD-10-CM

## 2020-11-11 NOTE — Addendum Note (Signed)
Addended by: Trenda Moots on: 11/08/1153 04:24 PM   Modules accepted: Orders

## 2020-11-11 NOTE — Addendum Note (Signed)
Addended by: Trenda Moots on: 10/14/2417 04:24 PM   Modules accepted: Orders

## 2020-11-14 DIAGNOSIS — M5489 Other dorsalgia: Secondary | ICD-10-CM | POA: Diagnosis not present

## 2020-11-14 DIAGNOSIS — G4486 Cervicogenic headache: Secondary | ICD-10-CM | POA: Diagnosis not present

## 2020-11-14 DIAGNOSIS — M542 Cervicalgia: Secondary | ICD-10-CM | POA: Diagnosis not present

## 2020-11-14 DIAGNOSIS — M546 Pain in thoracic spine: Secondary | ICD-10-CM | POA: Diagnosis not present

## 2020-11-14 LAB — IGA: Immunoglobulin A: 262 mg/dL (ref 70–320)

## 2020-11-14 LAB — TISSUE TRANSGLUTAMINASE, IGA: (tTG) Ab, IgA: <1 U/mL

## 2020-11-17 LAB — IMMUNOGLOBULINS A/E/G/M, SERUM
IgA/Immunoglobulin A, Serum: 283 mg/dL (ref 87–352)
IgE (Immunoglobulin E), Serum: 139 IU/mL (ref 6–495)
IgG (Immunoglobin G), Serum: 917 mg/dL (ref 586–1602)
IgM (Immunoglobulin M), Srm: 32 mg/dL (ref 26–217)

## 2020-11-21 DIAGNOSIS — M5489 Other dorsalgia: Secondary | ICD-10-CM | POA: Diagnosis not present

## 2020-11-21 DIAGNOSIS — G4486 Cervicogenic headache: Secondary | ICD-10-CM | POA: Diagnosis not present

## 2020-11-21 DIAGNOSIS — M542 Cervicalgia: Secondary | ICD-10-CM | POA: Diagnosis not present

## 2020-11-21 DIAGNOSIS — M546 Pain in thoracic spine: Secondary | ICD-10-CM | POA: Diagnosis not present

## 2020-11-30 ENCOUNTER — Encounter: Payer: Self-pay | Admitting: Family Medicine

## 2020-11-30 ENCOUNTER — Other Ambulatory Visit: Payer: Self-pay

## 2020-11-30 ENCOUNTER — Telehealth (INDEPENDENT_AMBULATORY_CARE_PROVIDER_SITE_OTHER): Payer: Medicare Other | Admitting: Family Medicine

## 2020-11-30 VITALS — BP 117/86 | HR 82

## 2020-11-30 DIAGNOSIS — U071 COVID-19: Secondary | ICD-10-CM | POA: Diagnosis not present

## 2020-11-30 NOTE — Progress Notes (Signed)
Virtual Visit via Video Note  Subjective  CC:  Chief Complaint  Patient presents with  . Covid Positive    At home COVID test positive this morning, symptoms started on Monday, not vaccinated      I connected with Yancey Flemings on 11/30/20 at  4:00 PM EST by a video enabled telemedicine application and verified that I am speaking with the correct person using two identifiers. Location patient: Home Location provider: [facility] [place], Office Persons participating in the virtual visit: Margaret White, Leamon Arnt, MD Reymundo Poll CMA  I discussed the limitations of evaluation and management by telemedicine and the availability of in person appointments. The patient expressed understanding and agreed to proceed. HPI: Margaret White is a 70 y.o. female who was contacted today to address the problems listed above in the chief complaint. . 70 year old female with multiple chronic medical problems including hypertension, obesity and fibromyalgia complains of sore throat, dry hacking cough and fatigue.  Symptoms started 2 days ago.  She denies fever or shortness of breath.  She is monitoring her pulse oximetry at home and reports that running in the 90s most of the time.  She denies chest pain, nausea, vomiting or diarrhea.  Appetite is down but she is keeping hydrated.  She has no lightheadedness.  She reports over-the-counter cough medicine is helpful.  She is not vaccinated against COVID.  She had a positive home Covid test this morning. . She has not gotten the Covid vaccine today mainly because she feels skeptical about the safety of the vaccine.  She also reports she is security knowing that the Reita Cliche will know when it is her time.   Assessment  1. COVID-19 virus infection      Plan   COVID-19 virus infection, high risk patient, unvaccinated status: Supportive care discussed.  Fluids, rest, hydration cough medicines for now.  Advil or Tylenol as needed.   She will monitor her symptoms and let me know if they are worsening.  She will monitor for shortness of breath or productive cough or high fever.  Will refer to the infusion center to see if she can obtain monoclonal antibody infusion.  Counseling given for vaccine recommendation.  Discussed safety and efficacy profile.  I recommend that she get the vaccination after she recovers and/or after the waiting period is over after antibiotic infusion.  Patient will think about it.  I discussed the assessment and treatment plan with the patient. The patient was provided an opportunity to ask questions and all were answered. The patient agreed with the plan and demonstrated an understanding of the instructions.   The patient was advised to call back or seek an in-person evaluation if the symptoms worsen or if the condition fails to improve as anticipated. Follow up: As scheduled 02/09/2021  No orders of the defined types were placed in this encounter.     I reviewed the patients updated PMH, FH, and SocHx.    Patient Active Problem List   Diagnosis Date Noted  . Colon polyps 08/19/2019    Priority: High  . Statin intolerance 03/25/2018    Priority: High  . GERD (gastroesophageal reflux disease) 10/14/2017    Priority: High  . NASH (nonalcoholic steatohepatitis) 10/14/2017    Priority: High  . OSA (obstructive sleep apnea) 10/14/2017    Priority: High  . Fibromyalgia 10/14/2017    Priority: High  . Obesity (BMI 30-39.9) 10/14/2017    Priority: High  .  Mixed hyperlipidemia 10/14/2017    Priority: High  . Essential hypertension 06/26/2017    Priority: High  . Incontinence without sensory awareness 05/08/2018    Priority: Medium  . Mixed incontinence 05/08/2018    Priority: Medium  . 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    Priority: Medium  . Bilateral primary osteoarthritis of hip 11/05/2017    Priority: Medium  . Spondylosis of lumbar region without myelopathy or  radiculopathy 11/04/2017    Priority: Medium  . IBS (irritable bowel syndrome) 10/14/2017    Priority: Medium  . Recurrent major depressive disorder, in full remission ([place]) 06/26/2017    Priority: Medium  . 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    Priority: Low  . Allergic rhinitis 10/14/2017    Priority: Low  . Chronic maxillary sinusitis 10/14/2017    Priority: Low  . Vitamin B12 deficiency 10/14/2017    Priority: Low  . Vitamin D deficiency 10/14/2017    Priority: Low  . Female cystocele 10/14/2017    Priority: Low  . Primary osteoarthritis of both hands 07/10/2017    Priority: Low  . Chronic cough 12/09/2018  . 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 History of cluster headache 10/14/2017  . Pelvic pain 10/14/2017   Current Meds  Medication Sig  . amLODipine (NORVASC) 5 MG tablet TAKE 1 TABLET DAILY (Patient taking differently: Take 5 mg by mouth daily.)  . calcium carbonate (OS-CAL) 600 MG TABS tablet Take 600 mg by mouth daily with breakfast.  . cetirizine (ZYRTEC) 10 MG tablet Take 10 mg by mouth daily.  . DULoxetine (CYMBALTA) 60 MG capsule Take 1 capsule (60 mg total) by mouth daily.  Marland Kitchen esomeprazole (NEXIUM) 40 MG capsule TAKE 1 CAPSULE TWICE A DAY BEFORE MEALS (Patient taking differently: Take 40 mg by mouth 2 (two) times daily before a meal.)  . FLUoxetine (PROZAC) 10 MG tablet Take 1 tablet (10 mg total) by mouth daily.  . fluticasone (FLONASE) 50 MCG/ACT nasal spray USE 1 SPRAY IN EACH NOSTRIL DAILY AS NEEDED FOR ALLERGIES (Patient taking differently: Place 1 spray into both nostrils daily.)  . gabapentin (NEURONTIN) 300 MG capsule TAKE 1 CAPSULE THREE TIMES A DAY (Patient taking differently: Take 300 mg by mouth 3 (three) times daily.)  . Polyvinyl Alcohol-Povidone PF (REFRESH) 1.4-0.6 % SOLN Place 1 drop into both eyes daily as needed (Dry eye).    Allergies: Patient is allergic to codeine and sulfa antibiotics. Family History: Patient family history includes Cancer in her  father; Heart disease in her father; Hypertension in her mother. Social History:  Patient  reports that she has never smoked. She has never used smokeless tobacco. She reports that she does not drink alcohol and does not use drugs.  Review of Systems: Constitutional: Negative for fever malaise or anorexia Cardiovascular: negative for chest pain Respiratory: negative for SOB or persistent cough Gastrointestinal: negative for abdominal pain  OBJECTIVE Vitals: BP 117/86   Pulse 82   SpO2 99%  General: no acute distress , A&Ox3 Nontoxic-appearing, no respiratory distress, hacking cough present.  Leamon Arnt, MD

## 2020-12-01 ENCOUNTER — Other Ambulatory Visit: Payer: Self-pay | Admitting: Infectious Diseases

## 2020-12-01 ENCOUNTER — Telehealth: Payer: Self-pay | Admitting: Infectious Diseases

## 2020-12-01 ENCOUNTER — Telehealth: Payer: Self-pay

## 2020-12-01 DIAGNOSIS — U071 COVID-19: Secondary | ICD-10-CM

## 2020-12-01 MED ORDER — PROMETHAZINE-DM 6.25-15 MG/5ML PO SYRP: 5.0000 mL | ORAL_SOLUTION | Freq: Four times a day (QID) | ORAL | 0 refills | Status: DC | PRN

## 2020-12-01 NOTE — Telephone Encounter (Signed)
Yes she is scheduled for tomorrow AM

## 2020-12-01 NOTE — Progress Notes (Signed)
I connected by phone with Margaret White on 12/01/2020 at 9:42 AM to discuss the potential use of a new treatment for mild to moderate COVID-19 viral infection in non-hospitalized patients.  This patient is a 70 y.o. female that meets the FDA criteria for Emergency Use Authorization of COVID monoclonal antibody sotrovimab.  Has a (+) direct SARS-CoV-2 viral test result  Has mild or moderate COVID-19   Is NOT hospitalized due to COVID-19  Is within 10 days of symptom onset  Has at least one of the high risk factor(s) for progression to severe COVID-19 and/or hospitalization as defined in EUA.  Specific high risk criteria : Older age (>/= 69 yo), Cardiovascular disease or hypertension and Other high risk medical condition per CDC:  unvaccinated   I have spoken and communicated the following to the patient or parent/caregiver regarding COVID monoclonal antibody treatment:  1. FDA has authorized the emergency use for the treatment of mild to moderate COVID-19 in adults and pediatric patients with positive results of direct SARS-CoV-2 viral testing who are 48 years of age and older weighing at least 40 kg, and who are at high risk for progressing to severe COVID-19 and/or hospitalization.  2. The significant known and potential risks and benefits of COVID monoclonal antibody, and the extent to which such potential risks and benefits are unknown.  3. Information on available alternative treatments and the risks and benefits of those alternatives, including clinical trials.  4. Patients treated with COVID monoclonal antibody should continue to self-isolate and use infection control measures (e.g., wear mask, isolate, social distance, avoid sharing personal items, clean and disinfect "high touch" surfaces, and frequent handwashing) according to CDC guidelines.   5. The patient or parent/caregiver has the option to accept or refuse COVID monoclonal antibody treatment.  After reviewing this  information with the patient, the patient has agreed to receive one of the available covid 19 monoclonal antibodies and will be provided an appropriate fact sheet prior to infusion. Janene Madeira, NP 12/01/2020 9:42 AM

## 2020-12-01 NOTE — Telephone Encounter (Signed)
Thank you. So are you getting this set up? Dr. Jonni Sanger

## 2020-12-01 NOTE — Telephone Encounter (Signed)
Called to discuss with patient about COVID-19 symptoms and the use of one of the available treatments for those with mild to moderate Covid symptoms and at a high risk of hospitalization.  Pt appears to qualify for outpatient treatment due to co-morbid conditions and/or a member of an at-risk group in accordance with the FDA Emergency Use Authorization.    Symptom onset: 11/27/20 Cough, sinus congestion, sore throat, fatigue Vaccinated: No Booster? No Immunocompromised? No Qualifiers: HTN  Pt. Would like to speak with APP.   Marcello Moores

## 2020-12-01 NOTE — Telephone Encounter (Signed)
Called to discuss with patient about COVID-19 symptoms and the use of one of the available treatments for those with mild to moderate Covid symptoms and at a high risk of hospitalization.  Pt appears to qualify for outpatient treatment due to co-morbid conditions and/or a member of an at-risk group in accordance with the FDA Emergency Use Authorization.    Symptom onset: 2/20 Vaccinated: no Booster?  Immunocompromised? no Qualifiers: Age, HTN, unvaccianted   +home test but not sure she still has the test or not.   Janene Madeira, NP

## 2020-12-01 NOTE — Telephone Encounter (Signed)
Great.  Thanks

## 2020-12-02 ENCOUNTER — Ambulatory Visit (HOSPITAL_COMMUNITY)
Admission: RE | Admit: 2020-12-02 | Discharge: 2020-12-02 | Disposition: A | Discharge status: Home / Self Care | Payer: Medicare Other | Source: Ambulatory Visit | Attending: Pulmonary Disease | Admitting: Pulmonary Disease

## 2020-12-02 DIAGNOSIS — U071 COVID-19: Secondary | ICD-10-CM

## 2020-12-02 MED ORDER — EPINEPHRINE 0.3 MG/0.3ML IJ SOAJ: 0.3000 mg | Freq: Once | INTRAMUSCULAR | Status: DC | PRN

## 2020-12-02 MED ORDER — DIPHENHYDRAMINE HCL 50 MG/ML IJ SOLN: 50.0000 mg | Freq: Once | INTRAMUSCULAR | Status: DC | PRN

## 2020-12-02 MED ORDER — ALBUTEROL SULFATE HFA 108 (90 BASE) MCG/ACT IN AERS: 2.0000 | INHALATION_SPRAY | Freq: Once | RESPIRATORY_TRACT | Status: DC | PRN

## 2020-12-02 MED ORDER — FAMOTIDINE IN NACL 20-0.9 MG/50ML-% IV SOLN: 20.0000 mg | Freq: Once | INTRAVENOUS | Status: DC | PRN

## 2020-12-02 MED ORDER — SODIUM CHLORIDE 0.9 % IV SOLN: INTRAVENOUS | Status: DC | PRN

## 2020-12-02 MED ORDER — METHYLPREDNISOLONE SODIUM SUCC 125 MG IJ SOLR: 125.0000 mg | Freq: Once | INTRAMUSCULAR | Status: DC | PRN

## 2020-12-02 MED ORDER — SOTROVIMAB 500 MG/8ML IV SOLN
500.0000 mg | Freq: Once | INTRAVENOUS | Status: AC
  Administered 2020-12-02: 500 mg via INTRAVENOUS

## 2020-12-02 NOTE — Progress Notes (Signed)
Patient reviewed Fact Sheet for Patients, Parents, and Caregivers for Emergency Use Authorization (EUA) of sotrovimab for the Treatment of Coronavirus. Patient also reviewed and is agreeable to the estimated cost of treatment. Patient is agreeable to proceed.   

## 2020-12-02 NOTE — Discharge Instructions (Signed)

## 2020-12-02 NOTE — Progress Notes (Signed)
Diagnosis: COVID-19  Physician: Dr. Patrick Wright  Procedure: [facility] Infusion [facility] Med: Sotrovimab infusion - Provided patient with sotrovimab fact sheet for patients, parents, and caregivers prior to infusion.   Complications: No immediate complications noted  Discharge: Discharged home    

## 2020-12-05 ENCOUNTER — Encounter: Payer: Self-pay | Admitting: Family Medicine

## 2020-12-05 NOTE — Telephone Encounter (Signed)
Yes, that is fine. thanks

## 2020-12-07 ENCOUNTER — Encounter: Payer: Self-pay | Admitting: Family Medicine

## 2020-12-09 ENCOUNTER — Encounter: Payer: Self-pay | Admitting: Gastroenterology

## 2020-12-09 ENCOUNTER — Ambulatory Visit (INDEPENDENT_AMBULATORY_CARE_PROVIDER_SITE_OTHER): Payer: Medicare Other | Admitting: Gastroenterology

## 2020-12-09 ENCOUNTER — Other Ambulatory Visit: Payer: Self-pay

## 2020-12-09 ENCOUNTER — Other Ambulatory Visit (INDEPENDENT_AMBULATORY_CARE_PROVIDER_SITE_OTHER): Payer: Medicare Other

## 2020-12-09 VITALS — BP 104/80 | HR 96 | Ht 62.6 in | Wt 162.4 lb

## 2020-12-09 DIAGNOSIS — D72820 Lymphocytosis (symptomatic): Secondary | ICD-10-CM | POA: Diagnosis not present

## 2020-12-09 DIAGNOSIS — R140 Abdominal distension (gaseous): Secondary | ICD-10-CM

## 2020-12-09 DIAGNOSIS — R748 Abnormal levels of other serum enzymes: Secondary | ICD-10-CM | POA: Diagnosis not present

## 2020-12-09 DIAGNOSIS — K839 Disease of biliary tract, unspecified: Secondary | ICD-10-CM

## 2020-12-09 DIAGNOSIS — R1013 Epigastric pain: Secondary | ICD-10-CM | POA: Diagnosis not present

## 2020-12-09 DIAGNOSIS — K7581 Nonalcoholic steatohepatitis (NASH): Secondary | ICD-10-CM | POA: Diagnosis not present

## 2020-12-09 LAB — HEPATIC FUNCTION PANEL
ALT: 52 U/L — ABNORMAL HIGH (ref 0–35)
AST: 44 U/L — ABNORMAL HIGH (ref 0–37)
Albumin: 4.3 g/dL (ref 3.5–5.2)
Alkaline Phosphatase: 93 U/L (ref 39–117)
Bilirubin, Direct: 0.1 mg/dL (ref 0.0–0.3)
Total Bilirubin: 0.6 mg/dL (ref 0.2–1.2)
Total Protein: 7.1 g/dL (ref 6.0–8.3)

## 2020-12-09 NOTE — Patient Instructions (Addendum)
It was a pleasure to see you today. Based on our discussion, I am providing you with my recommendations below:  RECOMMENDATION(S):    I am recommending that you have labs completed today.   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 Please proceed to the basement level for lab work before leaving today. Press "B" on the elevator. The lab is located at the first door on the left as you exit the elevator.  HEALTHCARE LAWS AND MY CHART RESULTS:   . Due to recent changes in healthcare laws, you may see results of your imaging and/or laboratory studies on MyChart before I have had a chance to review them.  I understand that in some cases there may be results that are confusing or concerning to you. Please understand that not all results are received at same time and often I may need to interpret multiple results in order to provide you with the best plan of care or course of treatment. Therefore, I ask that you please give me 48 hours to thoroughly review all your results before contacting my office for clarification.   BMI:  . If you are age 24 or older, your body mass index should be between 23-30. Your There is no height or weight on file to calculate BMI. If this is out of the aforementioned range listed, please consider follow up with your Primary Care Provider.  Thank you for trusting me with your gastrointestinal care!    Thornton Park, MD, MPH

## 2020-12-09 NOTE — Progress Notes (Signed)
Referring Provider: Leamon Arnt, MD Primary Care Physician:  Leamon Arnt, MD   Chief Complaint: Abnormal bile duct   IMPRESSION:  Abnormal bile duct on ultrasound and MRI/MRCP    - no abnormality seen on EUS Duodenal intraepithelial lymphocytosis    - testing for celiac and CVID negative NASH presenting with elevated ALT, echogenic liver on ultrasound    - HOMA-IR 3.9    - NASH FibroSURE 04/17/19: F0 S3    - [place] Elastography 04/30/19: F0/F1 with minimal fibrosis Dyspepsia +/- GERD    - H pylori negative 12/09/18    - abdominal ultrasound 12/18/18: echogenic liver, no gallstones, no biliary abnormalities    - labs 12/09/18: normal liver enzymes except for ALT 40    - improved on BID PPI Bacterial overgrowth presenting with bloating    - Hydrogen and Methane Analysis 04/20/19: + baceterial overgrowth.        - Increase in hydrogen 28ppp, increase in Methan 4ppm       - Increase in combined hydrogen and methane 32ppm       - Xifaxan 550 mg TID and Neomycin 500 mg BID x 14 days in November NSAID-related gastric erosions on EGD 03/25/19    - biopsies negative for H pylori Previously using daily NSAIDs (Excedrin twice daily for headaches) Personal history of colon polyps    -Polyps removed on colonoscopy in [place]    -Tubular adenoma removed by Dr. Lyndel Safe 10/20/2015    -Surveillance endoscopy recommended in 5 years (2022) History of IBS with alternating diarrhea and constipation    - previously treated with Imodium    - currently using Metamucil Hemorrhoids  Abnormal common bile duct on ultrasound and MRI: CBD is normal on EUS. Reassurance provided.   Dyspepsia +/- GERD improved on twice daily PPI therapy and avoidance of NSAIDs.  Duodenal intraepithelial lymphocytosis: The patient has discordant histologic findings suggestive of celiac but negative serologies. Will complete serologic work-up and perform perform HLA-DQ2/DQ8 genotyping. If haplotypes HLA-DQ2 or DQ8 are present, I  will recommend a gluten-free diet for 12 to 24 months and perform a repeat upper endoscopy with biopsies after 12 to 24 months of a gluten-free diet to confirm mucosal healing. If a histologic response is seen, celiac disease is likely.   Bacterial overgrowth responded to Xifaxan. Will need to plan retreatment with combination Xifaxan and Neomycin 500 mg BID with recurrent symptoms given her breath test results.   NASH diagnosis supported by Fibrosure and elastography with no significant fibrosis.  Supported by HOMA IR of 3.9. Reviewed the natural history and treatment of fatty liver disease. Given her history of diabetes and insulin resistance, she is at risk for fibrosis. Will restage her liver disease in July 2022.  History of polyps: Surveillance colonoscopy due 2022. She will wait until the summer to have the procedure for scheduling convenience.  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 She has a history of IBS with alternating diarrhea and constipation, tubular adenoma removed on colonoscopy with Dr. Lyndel Safe in 2017 and sigmoid diverticulosis.   She had an EGD 03/25/19 that revealed reactive gastropathy  and gastric erosions. Biopsies were negative for H pylori and celiac. Dyspepsia, postprandial vomiting, atypical chest pain, heartburn, dysphonia, cough, "esophageal rawness", and brash improved on twice daily PPI.   SIBO testing performed for persistent bloating with associated distension and frequent eructation.  Hydrogen and Methane Analysis 04/20/19 + bacterial overgrowth. Increase in hydrogen 28ppp, increase in  Methane 4ppm, increased in combained hydrogen and methane 32ppm. She was treatment with Xifaxan 550 mg TID and Neomycin 500 mg BID x 14 days. Symptoms improved while on treatment but returned after completing antibiotics.    Was found to have a persistently elevated ALT of 40.  Abdominal ultrasound 12/18/2018 showed mildly increased echogenic liver. No other abnormalities seen. MRI/MRCP 12/27/19 showed diffuse fatty liver, CBD measuring 11 mm. No stones or stricture.  Staging of liver disease included: NASH FibroSURE 04/17/19:  F0 S3 Ferritin 56.6, iron 126 HOMA 3.9 [place] Elastography 04/30/19: F0/F1 with minimal fibrosis  Labs 06/24/20: TB 0.4, AST 70, ALT 74, alk phos 114  Abdominal ultrasound 07/05/20 to evaluate new onset pruritus, that has since resolved, showed fatty liver and increased CBD without intrahepatic ductal dilatation, new compared to 12/2018.  MRI/MRCP 07/28/20 showed diffuse hepatic steatosis, no hepatic mass, mild biliary ductal dilatation with CBD measuring 16m.   EGD with EUS performed 10/27/20 showed a normal bile duct, galbaldder sludge. However, duodenal biopsies showed intraepithelial lymphocytosis. Gastric biopsies showed gastropathy. Esophageal biopsies were normal.   Immunoglobulins A, G, M, and E were normal TTGA <1.0  She has lost 25 pounds with Weight [employer]. Has been exercising but had to stop when she developed Covid a couple of weeks ago.  Continues to have gas and bloating, although symptoms have improved since losing weight. Finds that removing bone broth made the biggest difference. Notes some increased bloating when eating bread.     Past Medical History:  Diagnosis Date  . Allergic rhinitis   . Anxiety   . Asthma   . 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 Chronic cluster headache   . Chronic sinusitis   . Diabetes mellitus type 2, diet-controlled ([place]    per pt currently no taking metformin, followed by pcp  . Eczema   . Essential  hypertension    cardiologsit-- dr dr mBettina Gavia . Fatty liver   . Fibromyalgia   . GERD (gastroesophageal reflux disease)   . Hepatic steatosis 10/14/2017  . Hiatal hernia   . 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 History of colon polyps   . History of hyperthyroidism 2005  . History of pneumonia, recurrent   . History of seizure 2006   x1  . IBS (irritable bowel syndrome)   . IBS (irritable bowel syndrome)   . Mixed hyperlipidemia   . Nodule of lower lobe of left lung 10/2018   570m   pulmologist-- dr i.Meda CoffeebrLeory Plowmanstable per lov note in epic  . Ocular rosacea   . OSA (obstructive sleep apnea)    per pt intolerant to cpap due to sinus issues,  uses mouth guard   . [place]itis pubis ([place]   [facility]   . Pneumonia   . Primary osteoarthritis of both hands 07/10/2017  .  PTSD (post-traumatic stress disorder)   . Recurrent major depressive disorder, in full remission ([place]) 06/26/2017  . RLS (restless legs syndrome)   . Seasonal allergies   . Seizure ([place]) 2006   x 1  . Solitary pulmonary nodule    pulmologist-- dr i. Leory Plowman , lov note in epic,  stable  . 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 Shallow disc bulging without significant neuroforaminal stenosis.  Marland Kitchen SUI (stress urinary incontinence, female)   . Vitamin B 12 deficiency   . Vitamin D deficiency     Past Surgical History:  Procedure Laterality Date  . ABDOMINOPLASTY  2006  . ANAL FISSURE REPAIR  2006  . BIOPSY  10/27/2020   Procedure: BIOPSY;  Surgeon: Rush Landmark Telford Nab., MD;  Location: [place];   Service: Gastroenterology;;  . BLADDER SURGERY  1984  . BREAST BIOPSY Right     benign    . CARPAL TUNNEL RELEASE Bilateral 1989  . CATARACT EXTRACTION W/ INTRAOCULAR LENS  IMPLANT, BILATERAL  2017  . COLONOSCOPY W/ POLYPECTOMY  2017  . [place]  . ESOPHAGOGASTRODUODENOSCOPY (EGD) WITH PROPOFOL N/A 10/27/2020   Procedure: ESOPHAGOGASTRODUODENOSCOPY (EGD) WITH PROPOFOL;  Surgeon: Rush Landmark Telford Nab., MD;  Location: [place];  Service: Gastroenterology;  Laterality: N/A;  . EYE SURGERY  child    x4  1957-1960  . INCONTINENCE SURGERY    . NASAL SEPTUM SURGERY  1987  . PUBOVAGINAL SLING N/A 03/24/2019   Procedure: CYSTOSCOPY  Gaynelle Arabian;  Surgeon: Bjorn Loser, MD;  [facility];  Service: Urology;  Laterality: N/A;  . REVISION TOTAL HIP ARTHROPLASTY Right   . RHINOPLASTY  2007  . TOTAL ABDOMINAL HYSTERECTOMY W/ BILATERAL SALPINGOOPHORECTOMY  1984   w/ Marshall-Marchetti (bladder tack suspension)  . TOTAL HIP ARTHROPLASTY Right 04/11/2018   Procedure: RIGHT TOTAL HIP ARTHROPLASTY ANTERIOR APPROACH;  Surgeon: Mcarthur Rossetti, MD;  Location: [facility];  Service: Orthopedics;  Laterality: Right;  . ULNAR NERVE TRANSPOSITION Left 1990s  . UPPER ESOPHAGEAL ENDOSCOPIC ULTRASOUND (EUS) N/A 10/27/2020   Procedure: UPPER ESOPHAGEAL ENDOSCOPIC ULTRASOUND (EUS);  Surgeon: Irving Copas., MD;  Location: [place];  Service: Gastroenterology;  Laterality: N/A;    Current Outpatient Medications  Medication Sig Dispense Refill  . amLODipine (NORVASC) 5 MG tablet TAKE 1 TABLET DAILY (Patient taking differently: Take 5 mg by mouth daily.) 90 tablet 3  . calcium carbonate (OS-CAL) 600 MG TABS tablet Take 600 mg by mouth daily with breakfast.    . cetirizine (ZYRTEC) 10 MG tablet Take 10 mg by mouth daily.    . DULoxetine (CYMBALTA) 60 MG capsule Take 1 capsule (60 mg total) by mouth daily. 90 capsule 3  . esomeprazole (NEXIUM)  40 MG capsule TAKE 1 CAPSULE TWICE A DAY BEFORE MEALS (Patient taking differently: Take 40 mg by mouth 2 (two) times daily before a meal.) 180 capsule 3  . FLUoxetine (PROZAC) 10 MG tablet Take 1 tablet (10 mg total) by mouth daily. 90 tablet 3  . fluticasone (FLONASE) 50 MCG/ACT nasal spray USE 1 SPRAY IN EACH NOSTRIL DAILY AS NEEDED FOR ALLERGIES (Patient taking differently: Place 1 spray into both nostrils daily.) 16 g 5  . gabapentin (NEURONTIN) 300 MG capsule TAKE 1 CAPSULE THREE TIMES A DAY (Patient taking differently: Take 300 mg by mouth 3 (three) times daily.) 270 capsule 3  . Polyvinyl Alcohol-Povidone PF (REFRESH)  1.4-0.6 % SOLN Place 1 drop into both eyes daily as needed (Dry eye).     No current facility-administered medications for this visit.    Allergies as of 12/09/2020 - Review Complete 12/09/2020  Allergen Reaction Noted  . Codeine Itching 01/10/2016  . Sulfa antibiotics Rash and Other (See Comments) 01/10/2016    Family History  Problem Relation Age of Onset  . Hypertension Mother   . Congestive Heart Failure Mother   . Multiple myeloma Mother   . Heart disease Father   . Prostate cancer Father   . Emphysema Sister   . Heart disease Maternal Grandmother   . Emphysema Maternal Grandmother   . CAD Maternal Grandfather   . Heart disease Maternal Grandfather   . Cancer Paternal Grandmother        type unknown, mets  . Alcoholism Paternal Grandfather   . Heart disease Brother   . Other Brother        brain tumor  . Colon cancer Neg Hx   . Esophageal cancer Neg Hx   . Rectal cancer Neg Hx   . Stomach cancer Neg Hx      Physical Exam: General:   Alert, in NAD. No scleral icterus. No bilateral temporal wasting.  Heart:  Regular rate and rhythm; no murmurs Pulm: Clear anteriorly; no wheezing Abdomen:  Soft. Nontender. Nondistended. Normal bowel sounds. No rebound or guarding. No hepatosplenomegaly. LAD: No inguinal or umbilical LAD Extremities:  Without  edema. Neurologic:  Alert and  oriented x4;  grossly normal neurologically; no asterixis or clonus. Skin: No jaundice. No palmar erythema or spider angioma.  Psych:  Alert and cooperative. Normal mood and affect.    L. Tarri Glenn, MD, MPH [facility] 12/09/2020, 2:51 PM

## 2020-12-21 LAB — CELIAC HLA RFLX TO ABS
DQ2 (DQA1 0501/0505,DQB1 02XX): NEGATIVE
DQ8 (DQA1 03XX, DQB1 0302): NEGATIVE

## 2020-12-29 ENCOUNTER — Other Ambulatory Visit: Payer: Self-pay | Admitting: Family Medicine

## 2020-12-29 DIAGNOSIS — F3342 Major depressive disorder, recurrent, in full remission: Secondary | ICD-10-CM

## 2021-01-13 ENCOUNTER — Encounter: Payer: Self-pay | Admitting: Family Medicine

## 2021-01-25 ENCOUNTER — Encounter: Payer: Self-pay | Admitting: Family Medicine

## 2021-01-26 ENCOUNTER — Other Ambulatory Visit: Payer: Self-pay

## 2021-01-26 DIAGNOSIS — F3342 Major depressive disorder, recurrent, in full remission: Secondary | ICD-10-CM

## 2021-01-26 MED ORDER — FLUOXETINE HCL 10 MG PO TABS: 10.0000 mg | ORAL_TABLET | Freq: Every day | ORAL | 3 refills | Status: DC

## 2021-02-09 ENCOUNTER — Ambulatory Visit (INDEPENDENT_AMBULATORY_CARE_PROVIDER_SITE_OTHER): Payer: Medicare Other

## 2021-02-09 ENCOUNTER — Other Ambulatory Visit: Payer: Self-pay

## 2021-02-09 ENCOUNTER — Encounter: Payer: Self-pay | Admitting: Family Medicine

## 2021-02-09 ENCOUNTER — Ambulatory Visit (INDEPENDENT_AMBULATORY_CARE_PROVIDER_SITE_OTHER): Payer: Medicare Other | Admitting: Family Medicine

## 2021-02-09 VITALS — BP 140/90 | HR 75 | Temp 97.2°F | Ht 62.5 in | Wt 163.0 lb

## 2021-02-09 VITALS — BP 140/90 | Wt 163.0 lb

## 2021-02-09 DIAGNOSIS — M47816 Spondylosis without myelopathy or radiculopathy, lumbar region: Secondary | ICD-10-CM | POA: Diagnosis not present

## 2021-02-09 DIAGNOSIS — K7581 Nonalcoholic steatohepatitis (NASH): Secondary | ICD-10-CM

## 2021-02-09 DIAGNOSIS — F3342 Major depressive disorder, recurrent, in full remission: Secondary | ICD-10-CM | POA: Diagnosis not present

## 2021-02-09 DIAGNOSIS — E782 Mixed hyperlipidemia: Secondary | ICD-10-CM | POA: Diagnosis not present

## 2021-02-09 DIAGNOSIS — Z0000 Encounter for general adult medical examination without abnormal findings: Secondary | ICD-10-CM | POA: Diagnosis not present

## 2021-02-09 DIAGNOSIS — M797 Fibromyalgia: Secondary | ICD-10-CM | POA: Diagnosis not present

## 2021-02-09 DIAGNOSIS — R7303 Prediabetes: Secondary | ICD-10-CM | POA: Insufficient documentation

## 2021-02-09 DIAGNOSIS — I10 Essential (primary) hypertension: Secondary | ICD-10-CM | POA: Diagnosis not present

## 2021-02-09 DIAGNOSIS — J320 Chronic maxillary sinusitis: Secondary | ICD-10-CM

## 2021-02-09 LAB — CBC WITH DIFFERENTIAL/PLATELET
Basophils Absolute: 0 10*3/uL (ref 0.0–0.1)
Basophils Relative: 1.2 % (ref 0.0–3.0)
Eosinophils Absolute: 0.3 10*3/uL (ref 0.0–0.7)
Eosinophils Relative: 8 % — ABNORMAL HIGH (ref 0.0–5.0)
HCT: 41.2 % (ref 36.0–46.0)
Hemoglobin: 13.6 g/dL (ref 12.0–15.0)
Lymphocytes Relative: 42.4 % (ref 12.0–46.0)
Lymphs Abs: 1.6 10*3/uL (ref 0.7–4.0)
MCHC: 32.9 g/dL (ref 30.0–36.0)
MCV: 83.7 fl (ref 78.0–100.0)
Monocytes Absolute: 0.3 10*3/uL (ref 0.1–1.0)
Monocytes Relative: 7.7 % (ref 3.0–12.0)
Neutro Abs: 1.5 10*3/uL (ref 1.4–7.7)
Neutrophils Relative %: 40.7 % — ABNORMAL LOW (ref 43.0–77.0)
Platelets: 272 10*3/uL (ref 150.0–400.0)
RBC: 4.92 Mil/uL (ref 3.87–5.11)
RDW: 16 % — ABNORMAL HIGH (ref 11.5–15.5)
WBC: 3.7 10*3/uL — ABNORMAL LOW (ref 4.0–10.5)

## 2021-02-09 LAB — LIPID PANEL
Cholesterol: 208 mg/dL — ABNORMAL HIGH (ref 0–200)
HDL: 48.3 mg/dL (ref >39.00)
NonHDL: 159.35
Total CHOL/HDL Ratio: 4
Triglycerides: 211 mg/dL — ABNORMAL HIGH (ref 0.0–149.0)
VLDL: 42.2 mg/dL — ABNORMAL HIGH (ref 0.0–40.0)

## 2021-02-09 LAB — COMPREHENSIVE METABOLIC PANEL
ALT: 30 U/L (ref 0–35)
AST: 26 U/L (ref 0–37)
Albumin: 4.3 g/dL (ref 3.5–5.2)
Alkaline Phosphatase: 89 U/L (ref 39–117)
BUN: 16 mg/dL (ref 6–23)
CO2: 28 mEq/L (ref 19–32)
Calcium: 9.5 mg/dL (ref 8.4–10.5)
Chloride: 105 mEq/L (ref 96–112)
Creatinine, Ser: 0.98 mg/dL (ref 0.40–1.20)
GFR: 59.34 mL/min — ABNORMAL LOW (ref >60.00)
Glucose, Bld: 100 mg/dL — ABNORMAL HIGH (ref 70–99)
Potassium: 4.3 mEq/L (ref 3.5–5.1)
Sodium: 142 mEq/L (ref 135–145)
Total Bilirubin: 0.6 mg/dL (ref 0.2–1.2)
Total Protein: 7 g/dL (ref 6.0–8.3)

## 2021-02-09 LAB — LDL CHOLESTEROL, DIRECT: Direct LDL: 111 mg/dL

## 2021-02-09 LAB — HEMOGLOBIN A1C: Hgb A1c MFr Bld: 6.2 % (ref 4.6–6.5)

## 2021-02-09 LAB — TSH: TSH: 1.93 u[IU]/mL (ref 0.35–4.50)

## 2021-02-09 MED ORDER — FLUTICASONE PROPIONATE 50 MCG/ACT NA SUSP: NASAL | 3 refills | Status: DC

## 2021-02-09 NOTE — Progress Notes (Signed)
Subjective:   Margaret White is a 70 y.o. female who presents for Medicare Annual (Subsequent) preventive examination.  Review of Systems     Cardiac Risk Factors include: advanced age (>83mn, >>5women);dyslipidemia;hypertension     Objective:    Today's Vitals   02/09/21 0836  BP: 140/90  Weight: 163 lb (73.9 kg)  PainSc: 5    Body mass index is 29.34 kg/m.  Advanced Directives 02/09/2021 10/27/2020 09/26/2020 01/05/2020 03/24/2019 03/02/2019 08/08/2018  Does Patient Have a Medical Advance Directive? _0  No No  Would patient like information on creating a medical advance directive? No - Patient declined No - Patient declined No - Patient declined Yes (MAU/Ambulatory/Procedural Areas - Information given) No - Patient declined - Yes (ED - Information included in AVS)    Current Medications (verified) Outpatient Encounter Medications as of 02/09/2021  Medication Sig  . amLODipine (NORVASC) 5 MG tablet TAKE 1 TABLET DAILY (Patient taking differently: Take 5 mg by mouth daily.)  . calcium carbonate (OS-CAL) 600 MG TABS tablet Take 600 mg by mouth daily with breakfast.  . cetirizine (ZYRTEC) 10 MG tablet Take 10 mg by mouth daily.  . DULoxetine (CYMBALTA) 60 MG capsule Take 1 capsule (60 mg total) by mouth daily.  .Marland Kitchenesomeprazole (NEXIUM) 40 MG capsule TAKE 1 CAPSULE TWICE A DAY BEFORE MEALS (Patient taking differently: Take 40 mg by mouth 2 (two) times daily before a meal.)  . FLUoxetine (PROZAC) 10 MG tablet Take 1 tablet (10 mg total) by mouth daily.  . fluticasone (FLONASE) 50 MCG/ACT nasal spray USE 1 SPRAY IN EACH NOSTRIL DAILY AS NEEDED FOR ALLERGIES  . gabapentin (NEURONTIN) 300 MG capsule TAKE 1 CAPSULE THREE TIMES A DAY (Patient taking differently: Take 300 mg by mouth 3 (three) times daily.)  . Polyvinyl Alcohol-Povidone PF (REFRESH) 1.4-0.6 % SOLN Place 1 drop into both eyes daily as needed (Dry eye).  . [DISCONTINUED] fluticasone (FLONASE) 50 MCG/ACT nasal spray  USE 1 SPRAY IN EACH NOSTRIL DAILY AS NEEDED FOR ALLERGIES (Patient taking differently: Place 1 spray into both nostrils daily.)   No facility-administered encounter medications on file as of 02/09/2021.    Allergies (verified) Codeine and Sulfa antibiotics   History: Past Medical History:  Diagnosis Date  . Allergic rhinitis   . Anxiety   . Asthma   . Avascular necrosis of hip, right ([place] 03/24/2018  . Chronic cluster headache   . Chronic sinusitis   . Diabetes mellitus type 2, diet-controlled ([facility]    per pt currently no taking metformin, followed by pcp  . Eczema   . Essential hypertension    cardiologsit-- dr dr mBettina Gavia . Fatty liver   . Fibromyalgia   . GERD (gastroesophageal reflux disease)   . Hepatic steatosis 10/14/2017  . Hiatal hernia   . 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 History of colon polyps   . History of hyperthyroidism 2005  . History of pneumonia, recurrent   . History of seizure 2006   x1  . IBS (irritable bowel syndrome)   . IBS (irritable bowel syndrome)   . Mixed hyperlipidemia   . Nodule of lower lobe of left lung 10/2018   5101m   pulmologist-- dr i.Meda CoffeebrLeory Plowmanstable per  lov note in epic  . Ocular rosacea   . OSA (obstructive sleep apnea)    per pt intolerant to cpap due to sinus issues,  uses mouth guard   . 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 Pneumonia   . Primary osteoarthritis of both hands 07/10/2017  . PTSD (post-traumatic stress disorder)   . Recurrent major depressive disorder, in full remission ([place]) 06/26/2017  . RLS (restless legs syndrome)   . Seasonal allergies   . Seizure ([place]) 2006   x 1  . Solitary pulmonary nodule     pulmologist-- dr i. Leory Plowman , lov note in epic,  stable  . 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 Shallow disc bulging without significant neuroforaminal stenosis.  Marland Kitchen SUI (stress urinary incontinence, female)   . Vitamin B 12 deficiency   . Vitamin D deficiency    Past Surgical History:  Procedure Laterality Date  . ABDOMINOPLASTY  2006  . ANAL FISSURE REPAIR  2006  . BIOPSY  10/27/2020   Procedure: BIOPSY;  Surgeon: Rush Landmark Telford Nab., MD;  Location: [place];  Service: Gastroenterology;;  . BLADDER SURGERY  1984  . BREAST BIOPSY Right     benign    . CARPAL TUNNEL RELEASE Bilateral 1989  . CATARACT EXTRACTION W/ INTRAOCULAR LENS  IMPLANT, BILATERAL  2017  . COLONOSCOPY W/ POLYPECTOMY  2017  . [place]  . ESOPHAGOGASTRODUODENOSCOPY (EGD) WITH PROPOFOL N/A 10/27/2020   Procedure: ESOPHAGOGASTRODUODENOSCOPY (EGD) WITH PROPOFOL;  Surgeon: Rush Landmark Telford Nab., MD;  Location: [facility];  Service: Gastroenterology;  Laterality: N/A;  . EYE SURGERY  child    x4  1957-1960  . INCONTINENCE SURGERY    . NASAL SEPTUM SURGERY  1987  . PUBOVAGINAL SLING N/A 03/24/2019   Procedure: CYSTOSCOPY  Gaynelle Arabian;  Surgeon: Bjorn Loser, MD;  Location: [facility];  Service: Urology;  Laterality: N/A;  . REVISION TOTAL HIP ARTHROPLASTY Right   . RHINOPLASTY  2007  . TOTAL ABDOMINAL HYSTERECTOMY W/ BILATERAL SALPINGOOPHORECTOMY  1984   w/ Marshall-Marchetti (bladder tack suspension)  . TOTAL HIP ARTHROPLASTY Right 04/11/2018   Procedure: RIGHT TOTAL HIP ARTHROPLASTY ANTERIOR APPROACH;  Surgeon: Mcarthur Rossetti, MD;  Location: [facility];  Service: Orthopedics;  Laterality: Right;  . ULNAR NERVE TRANSPOSITION Left 1990s  . UPPER ESOPHAGEAL ENDOSCOPIC ULTRASOUND (EUS) N/A 10/27/2020   Procedure: UPPER ESOPHAGEAL ENDOSCOPIC  ULTRASOUND (EUS);  Surgeon: Irving Copas., MD;  Location: [place];  Service: Gastroenterology;  Laterality: N/A;   Family History  Problem Relation Age of Onset  . Hypertension Mother   . Congestive Heart Failure Mother   . Multiple myeloma Mother   . Heart disease Father   . Prostate cancer Father   . Emphysema Sister   . Heart disease Maternal Grandmother   . Emphysema Maternal Grandmother   . CAD Maternal Grandfather   . Heart disease Maternal Grandfather   . Cancer Paternal Grandmother        type unknown, mets  . Alcoholism Paternal Grandfather   . Heart disease Brother   . Other Brother        brain tumor  . Colon cancer Neg Hx   . Esophageal cancer Neg Hx   . Rectal cancer Neg Hx   . Stomach cancer Neg Hx  Social History   Socioeconomic History  . Marital status: Married    Spouse name: Not on file  . Number of children: 4  . Years of education: Not on file  . Highest education level: Not on file  Occupational History  . Occupation: Product manager: [employer]  Tobacco Use  . Smoking status: Never Smoker  . Smokeless tobacco: Never Used  Vaping Use  . Vaping Use: Never used  Substance and Sexual Activity  . Alcohol use: No  . Drug use: Never  . Sexual activity: Yes    Birth control/protection: Surgical  Other Topics Concern  . Not on file  Social History Narrative  . Not on file   Social Determinants of Health   Financial Resource Strain: Low Risk   . Difficulty of Paying Living Expenses: Not hard at all  Food Insecurity: No Food Insecurity  . Worried About Charity fundraiser in the Last Year: Never true  . Ran Out of Food in the Last Year: Never true  Transportation Needs: No Transportation Needs  . Lack of Transportation (Medical): No  . Lack of Transportation (Non-Medical): No  Physical Activity: Sufficiently Active  . Days of Exercise per Week: 5 days  . Minutes of Exercise per Session: 60 min  Stress:  Stress Concern Present  . Feeling of Stress : To some extent  Social Connections: Moderately Integrated  . Frequency of Communication with Friends and Family: Twice a week  . Frequency of Social Gatherings with Friends and Family: Twice a week  . Attends Religious Services: More than 4 times per year  . Active Member of Clubs or Organizations: No  . Attends Archivist Meetings: Never  . Marital Status: Married    Tobacco Counseling Counseling given: Not Answered   Clinical Intake:  Pre-visit preparation completed: Yes  Pain : 0-10 Pain Score: 5  Pain Type: Chronic pain Pain Location: Back Pain Orientation: Lower Pain Descriptors / Indicators: Sharp,Dull Pain Onset: More than a month ago Pain Frequency: Intermittent     BMI - recorded: 29.34 Nutritional Status: BMI 25 -29 Overweight Nutritional Risks: None Diabetes: No  How often do you need to have someone help you when you read instructions, pamphlets, or other written materials from your doctor or pharmacy?: 1 - Never  Diabetic?No  Interpreter Needed?: No  Information entered by :: Charlott Rakes, LPN   Activities of Daily Living In your present state of health, do you have any difficulty performing the following activities: 02/09/2021 02/09/2021  Hearing? Y Y  Comment - slight problem  Vision? N N  Difficulty concentrating or making decisions? N N  Walking or climbing stairs? N N  Dressing or bathing? N N  Doing errands, shopping? N N  Preparing Food and eating ? N -  Using the Toilet? N -  In the past six months, have you accidently leaked urine? N -  Do you have problems with loss of bowel control? N -  Managing your Medications? N -  Managing your Finances? N -  Housekeeping or managing your Housekeeping? N -  Some recent data might be hidden    Patient Care Team: Leamon Arnt, MD as PCP - General (Family Medicine) Lavonna Monarch, MD as Consulting Physician (Dermatology) Bjorn Loser, MD as Consulting Physician (Urology) Thornton Park, MD as Consulting Physician (Gastroenterology) Lynnell Dike, OD as Consulting Physician (Optometry)  Indicate any recent Medical Services you may have received from other  than [facility] providers in the past year (date may be approximate).     Assessment:   This is a routine wellness examination for [place].  Hearing/Vision screen  Hearing Screening   _0  _1  _2  _3  _4  _5  _6  _7  _8   Right ear:           Left ear:           Comments: Mild loss   Vision Screening Comments: Pt follows up with Dr Renaldo Fiddler in [place] for annual eye exams   Dietary issues and exercise activities discussed: Current Exercise Habits: Home exercise routine, Type of exercise: walking;treadmill, Time (Minutes): 60, Frequency (Times/Week): 5, Weekly Exercise (Minutes/Week): 300  Goals Addressed            This Visit's Progress   . Patient Stated       Lose weight       Depression Screen PHQ 2/9 Scores 02/09/2021 02/09/2021 08/10/2020 02/08/2020 01/05/2020 04/28/2019 03/30/2019  PHQ - 2 Score 0 0 0 0 0 0 0  PHQ- 9 Score - - 4 4 - 4 0    Fall Risk Fall Risk  02/09/2021 01/05/2020 03/30/2019 01/21/2019 12/09/2018  Falls in the past year? 0 0 0 0 0  Number falls in past yr: 0 0 0 0 0  Injury with Fall? 0 0 0 0 0  Risk for fall due to : Impaired vision;Impaired balance/gait - - - -  Follow up Falls prevention discussed Falls evaluation completed;Education provided;Falls prevention discussed - - -    FALL RISK PREVENTION PERTAINING TO THE HOME:  Any stairs in or around the home? Yes  If so, are there any without handrails? No  Home free of loose throw rugs in walkways, pet beds, electrical cords, etc? Yes  Adequate lighting in your home to reduce risk of falls? Yes   ASSISTIVE DEVICES UTILIZED TO PREVENT FALLS:  Life alert? No  Use of a cane, walker or w/c? No  Grab bars in the bathroom? No  Shower chair or bench in shower?  Yes  Elevated toilet seat or a handicapped toilet? No   TIMED UP AND GO:  Was the test performed? Yes .  Length of time to ambulate 10 feet: 10 sec.   Gait steady and fast without use of assistive device  Cognitive Function:     6CIT Screen 02/09/2021 01/05/2020  What Year? 0 points 0 points  What month? 0 points 0 points  What time? - 0 points  Count back from 20 0 points 0 points  Months in reverse 0 points 0 points  Repeat phrase 0 points 0 points  Total Score - 0    Immunizations Immunization History  Administered Date(s) Administered  . Fluad Quad(high Dose 65+) 06/24/2020  . Influenza,inj,Quad PF,6+ Mos 08/06/2017, 07/31/2018  . Influenza-Unspecified 07/23/2019  . Pneumococcal Conjugate-13 03/24/2018  . Pneumococcal Polysaccharide-23 08/19/2019  . Tdap 04/25/2018    TDAP status: Up to date  Flu Vaccine status: Up to date  Pneumococcal vaccine status: Up to date  Covid-19 vaccine status: Declined, Education has been provided regarding the importance of this vaccine but patient still declined. Advised may receive this vaccine at local pharmacy or Health Dept.or vaccine clinic. Aware to provide a copy of the vaccination record if obtained from local pharmacy or Health Dept. Verbalized acceptance and understanding.  Qualifies for Shingles Vaccine? Yes   Zostavax completed No   Shingrix Completed?: No.    Education has been provided regarding the importance of this  vaccine. Patient has been advised to call insurance company to determine out of pocket expense if they have not yet received this vaccine. Advised may also receive vaccine at local pharmacy or Health Dept. Verbalized acceptance and understanding.  Screening Tests Health Maintenance  Topic Date Due  . INFLUENZA VACCINE  05/08/2021  . MAMMOGRAM  08/26/2021  . DEXA SCAN  10/25/2022  . COLONOSCOPY (Pts 45-37yr Insurance coverage will need to be confirmed)  10/19/2025  . Hepatitis C Screening  Completed  .  PNA vac Low Risk Adult  Completed  . HPV VACCINES  Aged Out  . TETANUS/TDAP  Discontinued  . COVID-19 Vaccine  Discontinued    Health Maintenance  There are no preventive care reminders to display for this patient.  Colorectal cancer screening: Type of screening: Colonoscopy. Completed 10/20/15. Repeat every 10 years  Mammogram status: Completed 08/26/20. Repeat every year  Bone Density Status: 10/25/17 due in 5 years   Additional Screening:  Hepatitis C Screening:  Completed 03/31/19  Vision Screening: Recommended annual ophthalmology exams for early detection of glaucoma and other disorders of the eye. Is the patient up to date with their annual eye exam?  Yes  Who is the provider or what is the name of the office in which the patient attends annual eye exams? Dr sRenaldo Fiddler If pt is not established with a provider, would they like to be referred to a provider to establish care? No .   Dental Screening: Recommended annual dental exams for proper oral hygiene  Community Resource Referral / Chronic Care Management: CRR required this visit?  No   CCM required this visit?  No      Plan:     I have personally reviewed and noted the following in the patient's chart:   . Medical and social history . Use of alcohol, tobacco or illicit drugs  . Current medications and supplements including opioid prescriptions.  . Functional ability and status . Nutritional status . Physical activity . Advanced directives . List of other physicians . Hospitalizations, surgeries, and ER visits in previous 12 months . Vitals . Screenings to include cognitive, depression, and falls . Referrals and appointments  In addition, I have reviewed and discussed with patient certain preventive protocols, quality metrics, and best practice recommendations. A written personalized care plan for preventive services as well as general preventive health recommendations were provided to patient.     TWillette Brace LPN   [id number]/04/[id number]  Nurse Notes: None

## 2021-02-09 NOTE — Patient Instructions (Addendum)
Margaret White , Thank you for taking time to come for your Medicare Wellness Visit. I appreciate your ongoing commitment to your health goals. Please review the following plan we discussed and let me know if I can assist you in the future.   Screening recommendations/referrals: Colonoscopy: Done 10/20/15 Mammogram: Done 08/26/20 Bone Density: Done 10/25/17 Recommended yearly ophthalmology/optometry visit for glaucoma screening and checkup Recommended yearly dental visit for hygiene and checkup  Vaccinations: Influenza vaccine: Up to date Pneumococcal vaccine: Up to date Tdap vaccine: Up to date Shingles vaccine: Shingrix discussed. Please contact your pharmacy for coverage information.    Covid-19:Declined and discussed  Advanced directives: Advance directive discussed with you today. Even though you declined this today please call our office should you change your mind and we can give you the proper paperwork for you to fill out.  Conditions/risks identified: Lose weight   Next appointment: Follow up in one year for your annual wellness visit    Preventive Care 65 Years and Older, Female Preventive care refers to lifestyle choices and visits with your health care provider that can promote health and wellness. What does preventive care include?  A yearly physical exam. This is also called an annual well check.  Dental exams once or twice a year.  Routine eye exams. Ask your health care provider how often you should have your eyes checked.  Personal lifestyle choices, including:  Daily care of your teeth and gums.  Regular physical activity.  Eating a healthy diet.  Avoiding tobacco and drug use.  Limiting alcohol use.  Practicing safe sex.  Taking low-dose aspirin every day.  Taking vitamin and mineral supplements as recommended by your health care provider. What happens during an annual well check? The services and screenings done by your health care provider during  your annual well check will depend on your age, overall health, lifestyle risk factors, and family history of disease. Counseling  Your health care provider may ask you questions about your:  Alcohol use.  Tobacco use.  Drug use.  Emotional well-being.  Home and relationship well-being.  Sexual activity.  Eating habits.  History of falls.  Memory and ability to understand (cognition).  Work and work Statistician.  Reproductive health. Screening  You may have the following tests or measurements:  Height, weight, and BMI.  Blood pressure.  Lipid and cholesterol levels. These may be checked every 5 years, or more frequently if you are over 1 years old.  Skin check.  Lung cancer screening. You may have this screening every year starting at age 63 if you have a 30-pack-year history of smoking and currently smoke or have quit within the past 15 years.  Fecal occult blood test (FOBT) of the stool. You may have this test every year starting at age 37.  Flexible sigmoidoscopy or colonoscopy. You may have a sigmoidoscopy every 5 years or a colonoscopy every 10 years starting at age 49.  Hepatitis C blood test.  Hepatitis B blood test.  Sexually transmitted disease (STD) testing.  Diabetes screening. This is done by checking your blood sugar (glucose) after you have not eaten for a while (fasting). You may have this done every 1-3 years.  Bone density scan. This is done to screen for osteoporosis. You may have this done starting at age 74.  Mammogram. This may be done every 1-2 years. Talk to your health care provider about how often you should have regular mammograms. Talk with your health care provider about your test  results, treatment options, and if necessary, the need for more tests. Vaccines  Your health care provider may recommend certain vaccines, such as:  Influenza vaccine. This is recommended every year.  Tetanus, diphtheria, and acellular pertussis (Tdap,  Td) vaccine. You may need a Td booster every 10 years.  Zoster vaccine. You may need this after age 68.  Pneumococcal 13-valent conjugate (PCV13) vaccine. One dose is recommended after age 48.  Pneumococcal polysaccharide (PPSV23) vaccine. One dose is recommended after age 72. Talk to your health care provider about which screenings and vaccines you need and how often you need them. This information is not intended to replace advice given to you by your health care provider. Make sure you discuss any questions you have with your health care provider. Document Released: 10/21/2015 Document Revised: 06/13/2016 Document Reviewed: 07/26/2015 Elsevier Interactive Patient Education  2017 [employer] Prevention in the Home Falls can cause injuries. They can happen to people of all ages. There are many things you can do to make your home safe and to help prevent falls. What can I do on the outside of my home?  Regularly fix the edges of walkways and driveways and fix any cracks.  Remove anything that might make you trip as you walk through a door, such as a raised step or threshold.  Trim any bushes or trees on the path to your home.  Use bright outdoor lighting.  Clear any walking paths of anything that might make someone trip, such as rocks or tools.  Regularly check to see if handrails are loose or broken. Make sure that both sides of any steps have handrails.  Any raised decks and porches should have guardrails on the edges.  Have any leaves, snow, or ice cleared regularly.  Use sand or salt on walking paths during winter.  Clean up any spills in your garage right away. This includes oil or grease spills. What can I do in the bathroom?  Use night lights.  Install grab bars by the toilet and in the tub and shower. Do not use towel bars as grab bars.  Use non-skid mats or decals in the tub or shower.  If you need to sit down in the shower, use a plastic, non-slip  stool.  Keep the floor dry. Clean up any water that spills on the floor as soon as it happens.  Remove soap buildup in the tub or shower regularly.  Attach bath mats securely with double-sided non-slip rug tape.  Do not have throw rugs and other things on the floor that can make you trip. What can I do in the bedroom?  Use night lights.  Make sure that you have a light by your bed that is easy to reach.  Do not use any sheets or blankets that are too big for your bed. They should not hang down onto the floor.  Have a firm chair that has side arms. You can use this for support while you get dressed.  Do not have throw rugs and other things on the floor that can make you trip. What can I do in the kitchen?  Clean up any spills right away.  Avoid walking on wet floors.  Keep items that you use a lot in easy-to-reach places.  If you need to reach something above you, use a strong step stool that has a grab bar.  Keep electrical cords out of the way.  Do not use floor polish or wax that makes  floors slippery. If you must use wax, use non-skid floor wax.  Do not have throw rugs and other things on the floor that can make you trip. What can I do with my stairs?  Do not leave any items on the stairs.  Make sure that there are handrails on both sides of the stairs and use them. Fix handrails that are broken or loose. Make sure that handrails are as long as the stairways.  Check any carpeting to make sure that it is firmly attached to the stairs. Fix any carpet that is loose or worn.  Avoid having throw rugs at the top or bottom of the stairs. If you do have throw rugs, attach them to the floor with carpet tape.  Make sure that you have a light switch at the top of the stairs and the bottom of the stairs. If you do not have them, ask someone to add them for you. What else can I do to help prevent falls?  Wear shoes that:  Do not have high heels.  Have rubber bottoms.  Are  comfortable and fit you well.  Are closed at the toe. Do not wear sandals.  If you use a stepladder:  Make sure that it is fully opened. Do not climb a closed stepladder.  Make sure that both sides of the stepladder are locked into place.  Ask someone to hold it for you, if possible.  Clearly mark and make sure that you can see:  Any grab bars or handrails.  First and last steps.  Where the edge of each step is.  Use tools that help you move around (mobility aids) if they are needed. These include:  Canes.  Walkers.  Scooters.  Crutches.  Turn on the lights when you go into a dark area. Replace any light bulbs as soon as they burn out.  Set up your furniture so you have a clear path. Avoid moving your furniture around.  If any of your floors are uneven, fix them.  If there are any pets around you, be aware of where they are.  Review your medicines with your doctor. Some medicines can make you feel dizzy. This can increase your chance of falling. Ask your doctor what other things that you can do to help prevent falls. This information is not intended to replace advice given to you by your health care provider. Make sure you discuss any questions you have with your health care provider. Document Released: 07/21/2009 Document Revised: 03/01/2016 Document Reviewed: 10/29/2014 Elsevier Interactive Patient Education  2017 [employer].

## 2021-02-09 NOTE — Progress Notes (Signed)
Subjective  Chief Complaint  Patient presents with  . Annual Exam    HPI: Margaret White is a 70 y.o. female who presents to [facility] today for a Female Wellness Visit. She also has the concerns and/or needs as listed above in the chief complaint. These will be addressed in addition to the Health Maintenance Visit.   Wellness Visit: annual visit with health maintenance review and exam without Pap   HM: Health screens are current.  Had normal bone density screening at age 9.  Next due at age 89.  She is participating in [employer] and has lost weight.  Feeling better.  Working out [employer].  Chronic disease f/u and/or acute problem visit: (deemed necessary to be done in addition to the wellness visit):  Complains of low back pain.  Has history of DJD and spondylolisthesis in the lumbar spine.  I reviewed MRI from 2019.  She denies radicular symptoms.  More pain on the left.  Also complains of foot pain.  No bowel or bladder problems.  Fibromyalgia: Persistent pain.  Comes and goes.  On Cymbalta.  Karlene Lineman and GI issues including IBS, history of bacterial overgrowth, itching and abnormal bile duct: Reviewed GI notes and recent lab testing.  So far no new diagnoses.  Reassured.  Has follow-up for NASH staging in July.  History of prediabetes: No symptoms of hyperglycemia.  Weight is down.  Eating healthy.  Likely has resolved it.  History of hyperlipidemia but intolerant to statins.  Hypertension: No chest pain, lower extremity swelling, palpitations.  Blood pressure elevated today likely due to pain.  Home readings have been normal.  She reports 120s over 70s.  She takes amlodipine 5 mg daily.  History of recurrent depression: Reportedly stable.  Continues on Cymbalta for pain and mood.  Assessment  1. Essential hypertension   2. Chronic maxillary sinusitis   3. Fibromyalgia   4. Mixed hyperlipidemia   5. NASH (nonalcoholic steatohepatitis)   6.  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 Recurrent major depressive disorder, in full remission [facility]) Chronic     Plan  Female Wellness Visit:  Age appropriate Health Maintenance and Prevention measures were discussed with patient. Included topics are cancer screening recommendations, ways to keep healthy (see AVS) including dietary and exercise recommendations, regular eye and dental care, use of seat belts, and avoidance of moderate alcohol use and tobacco use.  Screens are up-to-date  BMI: discussed patient's BMI and encouraged positive lifestyle modifications to help get to or maintain a target BMI.  HM needs and immunizations were addressed and ordered. See below for orders. See HM and immunization section for updates.  Eligible for Shingrix  Routine labs and screening tests ordered including cmp, cbc and lipids where appropriate.  Discussed recommendations regarding Vit D and calcium supplementation (see AVS)  Chronic disease management visit and/or acute problem visit:  Hypertension: Fairly good control by home readings.  Possibly falsely elevated reading today.  Reviewed old records.  No changes in amlodipine 5 mg daily.  Patient to check at home and follow-up with remains elevated.  Fibromyalgia and back pain, osteoarthritis: Continue supportive care.  No red flags noted today.  Recheck lipids and sugars.  Continue healthy diet and weight loss.  Depression stable and in remission on Cymbalta.  NASH and IBS per GI.  Allergies and history of chronic sinusitis: Continue Flonase.  No signs of infection at this  time.  Follow up: 6 months for Hypertension follow up and recheck Orders Placed This Encounter  Procedures  . CBC with Differential/Platelet  . Comprehensive metabolic panel  . Lipid panel  . Hemoglobin A1c  . TSH   Meds ordered this encounter  Medications  . fluticasone (FLONASE) 50 MCG/ACT nasal spray    Sig: USE 1  SPRAY IN EACH NOSTRIL DAILY AS NEEDED FOR ALLERGIES    Dispense:  48 g    Refill:  3      Body mass index is 29.34 kg/m. Wt Readings from Last 3 Encounters:  02/09/21 163 lb (73.9 kg)  12/09/20 162 lb 6 oz (73.7 kg)  10/27/20 169 lb (76.7 kg)     Patient Active Problem List   Diagnosis Date Noted  . Colon polyps 08/19/2019    Priority: High  . Statin intolerance 03/25/2018    Priority: High  . GERD (gastroesophageal reflux disease) 10/14/2017    Priority: High  . NASH (nonalcoholic steatohepatitis) 10/14/2017    Priority: High    Advanced hepatic steatosis by ultrasound; followed by Dr. Tarri Glenn   . OSA (obstructive sleep apnea) 10/14/2017    Priority: High  . Fibromyalgia 10/14/2017    Priority: High  . Obesity (BMI 30-39.9) 10/14/2017    Priority: High  . Mixed hyperlipidemia 10/14/2017    Priority: High  . Essential hypertension 06/26/2017    Priority: High  . Incontinence without sensory awareness 05/08/2018    Priority: Medium    Followed by [facility]    . Mixed incontinence 05/08/2018    Priority: Medium    Followed by [facility]    . 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    Priority: Medium  . Bilateral primary osteoarthritis of hip 11/05/2017    Priority: Medium    Bilateral right worse than left that is mild in nature X-rays obtained in [place] on 07/12/2017 that are available through the [facility] PACS system   . Spondylosis of lumbar region without myelopathy or radiculopathy 11/04/2017    Priority: Medium    MRI 11/01/2017: Severe L4-5 facet arthrosis on the right greater than left. Shallow disc bulging without significant neuroforaminal stenosis.   . IBS (irritable bowel syndrome) 10/14/2017    Priority: Medium  . Recurrent major depressive disorder, in full remission ([place]) 06/26/2017    Priority: Medium    Last Assessment & Plan:  Stable on medications  No suicidal ideation  Continue prozac   . Ocular rosacea  05/05/2018    Priority: Low  . Allergic rhinitis 10/14/2017    Priority: Low  . Chronic maxillary sinusitis 10/14/2017    Priority: Low  . Vitamin B12 deficiency 10/14/2017    Priority: Low  . Vitamin D deficiency 10/14/2017    Priority: Low  . Female cystocele 10/14/2017    Priority: Low  . Primary osteoarthritis of both hands 07/10/2017    Priority: Low    Last Assessment & Plan:  Try mobic  Stay hydrated  Monitor renal panel   . Prediabetes 02/09/2021  . Chronic cough 12/09/2018  . 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 History of cluster headache 10/14/2017  . Pelvic pain 10/14/2017   Health Maintenance  Topic Date Due  . INFLUENZA VACCINE  05/08/2021  . MAMMOGRAM  08/26/2021  . DEXA SCAN  10/25/2022  . COLONOSCOPY (Pts 45-18yr Insurance coverage will need to be confirmed)  10/19/2025  . Hepatitis C Screening  Completed  . PNA vac Low Risk Adult  Completed  .  HPV VACCINES  Aged Out  . TETANUS/TDAP  Discontinued  . COVID-19 Vaccine  Discontinued   Immunization History  Administered Date(s) Administered  . Fluad Quad(high Dose 65+) 06/24/2020  . Influenza,inj,Quad PF,6+ Mos 08/06/2017, 07/31/2018  . Influenza-Unspecified 07/23/2019  . Pneumococcal Conjugate-13 03/24/2018  . Pneumococcal Polysaccharide-23 08/19/2019  . Tdap 04/25/2018   We updated and reviewed the patient's past history in detail and it is documented below. Allergies: Patient is allergic to codeine and sulfa antibiotics. Past Medical History Patient  has a past medical history of Allergic rhinitis, Anxiety, Asthma, Avascular necrosis of hip, right ([place]) (03/24/2018), Chronic cluster headache, Chronic sinusitis, Diabetes mellitus type 2, diet-controlled ([place]), Eczema, Essential hypertension, Fatty liver, Fibromyalgia, GERD (gastroesophageal reflux disease), Hepatic steatosis (10/14/2017), Hiatal hernia, Hip osteoarthritis (11/05/2017), History of avascular necrosis of capital femoral epiphysis, History of  colon polyps, History of hyperthyroidism (2005), History of pneumonia, recurrent, History of seizure (2006), IBS (irritable bowel syndrome), IBS (irritable bowel syndrome), Mixed hyperlipidemia, Nodule of lower lobe of left lung (10/2018), Ocular rosacea, OSA (obstructive sleep apnea), 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 Past Surgical History Patient  has a past surgical history that includes Rhinoplasty (2007); Carpal tunnel release (Bilateral, 1989); Total hip arthroplasty (Right, 04/11/2018); Colonoscopy w/ polypectomy (2017); Ulnar nerve transposition (Left, 1990s); Bladder surgery (1984); Nasal septum surgery (1987); Ectopic pregnancy surgery (1979); Eye surgery (child ); Cataract extraction w/ intraocular lens  implant, bilateral (2017); Anal fissure repair (2006); Abdominoplasty (2006); Total abdominal hysterectomy w/ bilateral salpingoophorectomy (1984); Pubovaginal sling (N/A, 03/24/2019); Incontinence surgery; Breast biopsy (Right); Revision total hip arthroplasty (Right); Esophagogastroduodenoscopy (egd) with propofol (N/A, 10/27/2020); Upper esophageal endoscopic ultrasound (eus) (N/A, 10/27/2020); and biopsy (10/27/2020). Family History: Patient family history includes Alcoholism in her paternal grandfather; CAD in her maternal grandfather; Cancer in her paternal grandmother; Congestive Heart Failure in her mother; Emphysema in her maternal grandmother and sister; Heart disease in her brother, father, maternal grandfather, and maternal grandmother; Hypertension in her  mother; Multiple myeloma in her mother; Other in her brother; Prostate cancer in her father. Social History:  Patient  reports that she has never smoked. She has never used smokeless tobacco. She reports that she does not drink alcohol and does not use drugs.  Review of Systems: Constitutional: negative for fever or malaise Ophthalmic: negative for photophobia, double vision or loss of vision Cardiovascular: negative for chest pain, dyspnea on exertion, or new LE swelling Respiratory: negative for SOB or persistent cough Gastrointestinal: negative for abdominal pain, change in bowel habits or melena Genitourinary: negative for dysuria or gross hematuria, no abnormal uterine bleeding or disharge Musculoskeletal: negative for new gait disturbance or muscular weakness Integumentary: negative for new or persistent rashes, no breast lumps Neurological: negative for TIA or stroke symptoms Psychiatric: negative for SI or delusions Allergic/Immunologic: negative for hives  Patient Care Team    Relationship Specialty Notifications Start End  Leamon Arnt, MD PCP - General Family Medicine  08/19/19   Lavonna Monarch, MD Consulting Physician Dermatology  05/05/18   Bjorn Loser, MD Consulting Physician Urology  05/08/18   Thornton Park, MD Consulting Physician Gastroenterology  08/19/19   Lynnell Dike, [place] Physician Optometry  01/05/20  Objective  Vitals: BP 140/90 (BP Location: Left Arm, Patient Position: Sitting, Cuff Size: Normal)   Pulse 75   Temp (!) 97.2 F (36.2 C) (Temporal)   Ht 5' 2.5" (1.588 m)   Wt 163 lb (73.9 kg)   SpO2 95%   BMI 29.34 kg/m  General:  Well developed, well nourished, no acute distress  Psych:  Alert and orientedx3,normal mood and affect HEENT:  Normocephalic, atraumatic, non-icteric sclera,  supple neck without adenopathy, mass or thyromegaly Cardiovascular:  Normal S1, S2, RRR without gallop, rub or murmur Respiratory:  Good breath  sounds bilaterally, CTAB with normal respiratory effort Gastrointestinal: normal bowel sounds, soft, non-tender, no noted masses. No HSM MSK: no deformities, contusions. Joints are without erythema or swelling.  Negative straight leg raise seated bilaterally Skin:  Warm, no rashes or suspicious lesions noted    Commons side effects, risks, benefits, and alternatives for medications and treatment plan prescribed today were discussed, and the patient expressed understanding of the given instructions. Patient is instructed to call or message via MyChart if he/she has any questions or concerns regarding our treatment plan. No barriers to understanding were identified. We discussed Red Flag symptoms and signs in detail. Patient expressed understanding regarding what to do in case of urgent or emergency type symptoms.   Medication list was reconciled, printed and provided to the patient in AVS. Patient instructions and summary information was reviewed with the patient as documented in the AVS. This note was prepared with assistance of Dragon voice recognition software. Occasional wrong-word or sound-a-like substitutions may have occurred due to the inherent limitations of voice recognition software  This visit occurred during the SARS-CoV-2 public health emergency.  Safety protocols were in place, including screening questions prior to the visit, additional usage of staff PPE, and extensive cleaning of exam room while observing appropriate contact time as indicated for disinfecting solutions.

## 2021-02-09 NOTE — Patient Instructions (Signed)
Please return in 6 months for hypertension follow up. I will release your lab results to you on your MyChart account with further instructions. Please reply with any questions.   Let me know if your home blood pressures are running high; we'd like it to be 120s/70s consistently.   I reviewed your low back MRI from 2019; you do have arthritis in your low back. The disc bulge noted was very small and not impinging anything at that time.  If you have any questions or concerns, please don't hesitate to send me a message via MyChart or call the office at [phone]. Thank you for visiting with [place] today! It's our pleasure caring for you.

## 2021-02-17 DIAGNOSIS — M79672 Pain in left foot: Secondary | ICD-10-CM | POA: Diagnosis not present

## 2021-02-17 DIAGNOSIS — S99922A Unspecified injury of left foot, initial encounter: Secondary | ICD-10-CM | POA: Diagnosis not present

## 2021-02-19 ENCOUNTER — Encounter: Payer: Self-pay | Admitting: Family Medicine

## 2021-02-28 ENCOUNTER — Ambulatory Visit (INDEPENDENT_AMBULATORY_CARE_PROVIDER_SITE_OTHER): Payer: Medicare Other

## 2021-02-28 ENCOUNTER — Encounter: Payer: Self-pay | Admitting: Orthopaedic Surgery

## 2021-02-28 ENCOUNTER — Ambulatory Visit (INDEPENDENT_AMBULATORY_CARE_PROVIDER_SITE_OTHER): Payer: Medicare Other | Admitting: Orthopaedic Surgery

## 2021-02-28 DIAGNOSIS — M79672 Pain in left foot: Secondary | ICD-10-CM

## 2021-02-28 DIAGNOSIS — S92355A Nondisplaced fracture of fifth metatarsal bone, left foot, initial encounter for closed fracture: Secondary | ICD-10-CM

## 2021-02-28 NOTE — Progress Notes (Signed)
Office Visit Note   Patient: Mattalynn Crandle           Date of Birth: [birth year]           MRN: [id number] Visit Date: 02/28/2021              Requested by: Leamon Arnt, [address] [place],  [place] [address] PCP: Leamon Arnt, MD   Assessment & Plan: Visit Diagnoses:  1. Pain in left foot   2. Closed nondisplaced fracture of fifth metatarsal bone of left foot, initial encounter     Plan: I went over the x-rays with the patient of her left foot showing her the fracture.  She can attempt full weightbearing as tolerated in the postop shoe and backing off if it is hurting too much.  She will avoid high impact aerobic activities and we will see her back in 4 weeks with a repeat 3 views of the left foot.  All questions and concerns were answered and addressed.  Follow-Up Instructions: Return in about 4 weeks (around 03/28/2021).   Orders:  Orders Placed This Encounter  Procedures  . XR Foot Complete Left   No orders of the defined types were placed in this encounter.     Procedures: No procedures performed   Clinical Data: No additional findings.   Subjective: Chief Complaint  Patient presents with  . Left Foot - Pain, Injury  The patient is a 70 year old female who injured her left foot last Friday when she ran into her dog in the dark.  She has been placed in a postop shoe and has been nonweightbearing on the left side due to the fifth metatarsal fracture.  She is using a rolling scooter as well.  She reports just mild pain.  She is an active and does not walk for exercise.  She is not a diabetic and not a smoker.  HPI  Review of Systems There is currently listed no headache, chest pain, shortness of breath, fever, chills, nausea, vomiting  Objective: Vital Signs: There were no vitals taken for this visit.  Physical Exam She is alert and orient x3 and in no acute distress Ortho Exam Examination of her left foot shows some bruising around the  dorsal and lateral aspect of the foot.  She has a palpable pulse and normal sensation in her foot.  There is good alignment overall. Specialty Comments:  No specialty comments available.  Imaging: XR Foot Complete Left  Result Date: 02/28/2021 3 views of the left foot show fifth metatarsal shaft fracture that is nondisplaced.  The MTP joint there is normal.    PMFS History: Patient Active Problem List   Diagnosis Date Noted  . Prediabetes 02/09/2021  . Colon polyps 08/19/2019  . Chronic cough 12/09/2018  . Incontinence without sensory awareness 05/08/2018  . Mixed incontinence 05/08/2018  . 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 Statin intolerance 03/25/2018  . 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 Bilateral primary osteoarthritis of hip 11/05/2017  . 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 GERD (gastroesophageal reflux disease) 10/14/2017  . Allergic rhinitis 10/14/2017  . NASH (nonalcoholic steatohepatitis) 10/14/2017  . OSA (obstructive sleep apnea) 10/14/2017  . History of cluster headache 10/14/2017  . IBS (irritable bowel syndrome) 10/14/2017  . Fibromyalgia 10/14/2017  . Chronic maxillary sinusitis 10/14/2017  . Pelvic pain 10/14/2017  . Obesity (BMI 30-39.9) 10/14/2017  . Mixed hyperlipidemia 10/14/2017  .  Vitamin B12 deficiency 10/14/2017  . Vitamin D deficiency 10/14/2017  . Female cystocele 10/14/2017  . Primary osteoarthritis of both hands 07/10/2017  . Essential hypertension 06/26/2017  . Recurrent major depressive disorder, in full remission ([place]) 06/26/2017   Past Medical History:  Diagnosis Date  . Allergic rhinitis   . Anxiety   . Asthma   . Avascular necrosis of hip, right ([place]) 03/24/2018  . Chronic cluster headache   . Chronic sinusitis   . Diabetes mellitus type 2, diet-controlled ([place])    per pt currently no taking metformin, followed by pcp  . Eczema   .  Essential hypertension    cardiologsit-- dr dr Bettina Gavia  . Fatty liver   . Fibromyalgia   . GERD (gastroesophageal reflux disease)   . Hepatic steatosis 10/14/2017  . Hiatal hernia   . 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 History of colon polyps   . History of hyperthyroidism 2005  . History of pneumonia, recurrent   . History of seizure 2006   x1  . IBS (irritable bowel syndrome)   . IBS (irritable bowel syndrome)   . Mixed hyperlipidemia   . Nodule of lower lobe of left lung 10/2018   84m;   pulmologist-- dr iMeda Coffee bLeory Plowman stable per lov note in epic  . Ocular rosacea   . OSA (obstructive sleep apnea)    per pt intolerant to cpap due to sinus issues,  uses mouth guard   . Osteitis pubis ([facility] [place] 11/05/2017   [facility]   . Pneumonia   . Primary osteoarthritis of both hands 07/10/2017  . PTSD (post-traumatic stress disorder)   . Recurrent major depressive disorder, in full remission ([facility] 06/26/2017  . RLS (restless legs syndrome)   . Seasonal allergies   . Seizure ([place] [place] 2006   x 1  . Solitary pulmonary nodule    pulmologist-- dr i. bLeory Plowman, lov note in epic,  stable  . 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 Shallow disc bulging without significant neuroforaminal stenosis.  .Marland KitchenSUI (stress urinary incontinence, female)   . Vitamin B 12 deficiency   . Vitamin D deficiency     Family History  Problem Relation Age of Onset  . Hypertension Mother   . Congestive Heart Failure Mother   . Multiple myeloma Mother   . Heart disease Father   . Prostate cancer Father   . Emphysema Sister    . Heart disease Maternal Grandmother   . Emphysema Maternal Grandmother   . CAD Maternal Grandfather   . Heart disease Maternal Grandfather   . Cancer Paternal Grandmother        type unknown, mets  . Alcoholism Paternal Grandfather   . Heart disease Brother   . Other Brother        brain tumor  . Colon cancer Neg Hx   . Esophageal cancer Neg Hx   . Rectal cancer Neg Hx   . Stomach cancer Neg Hx     Past Surgical History:  Procedure Laterality Date  . ABDOMINOPLASTY  2006  . ANAL FISSURE REPAIR  2006  .  BIOPSY  10/27/2020   Procedure: BIOPSY;  Surgeon: Rush Landmark Telford Nab., MD;  Location: [facility];  Service: Gastroenterology;;  . BLADDER SURGERY  1984  . BREAST BIOPSY Right     benign    . CARPAL TUNNEL RELEASE Bilateral 1989  . CATARACT EXTRACTION W/ INTRAOCULAR LENS  IMPLANT, BILATERAL  2017  . COLONOSCOPY W/ POLYPECTOMY  2017  . [place]  . ESOPHAGOGASTRODUODENOSCOPY (EGD) WITH PROPOFOL N/A 10/27/2020   Procedure: ESOPHAGOGASTRODUODENOSCOPY (EGD) WITH PROPOFOL;  Surgeon: Rush Landmark Telford Nab., MD;  Location: [facility];  Service: Gastroenterology;  Laterality: N/A;  . EYE SURGERY  child    x4  1957-1960  . INCONTINENCE SURGERY    . NASAL SEPTUM SURGERY  1987  . PUBOVAGINAL SLING N/A 03/24/2019   Procedure: CYSTOSCOPY  Gaynelle Arabian;  Surgeon: Bjorn Loser, MD;  Location: [facility];  Service: Urology;  Laterality: N/A;  . REVISION TOTAL HIP ARTHROPLASTY Right   . RHINOPLASTY  2007  . TOTAL ABDOMINAL HYSTERECTOMY W/ BILATERAL SALPINGOOPHORECTOMY  1984   w/ Marshall-Marchetti (bladder tack suspension)  . TOTAL HIP ARTHROPLASTY Right 04/11/2018   Procedure: RIGHT TOTAL HIP ARTHROPLASTY ANTERIOR APPROACH;  Surgeon: Mcarthur Rossetti, MD;  Location: [facility];  Service: Orthopedics;  Laterality: Right;  . ULNAR NERVE TRANSPOSITION Left 1990s  . UPPER ESOPHAGEAL ENDOSCOPIC ULTRASOUND (EUS) N/A 10/27/2020   Procedure:  UPPER ESOPHAGEAL ENDOSCOPIC ULTRASOUND (EUS);  Surgeon: Irving Copas., MD;  Location: [facility];  Service: Gastroenterology;  Laterality: N/A;   Social History   Occupational History  . Occupation: Product manager: [employer]  Tobacco Use  . Smoking status: Never Smoker  . Smokeless tobacco: Never Used  Vaping Use  . Vaping Use: Never used  Substance and Sexual Activity  . Alcohol use: No  . Drug use: Never  . Sexual activity: Yes    Birth control/protection: Surgical

## 2021-03-28 ENCOUNTER — Encounter: Payer: Self-pay | Admitting: Orthopaedic Surgery

## 2021-03-28 ENCOUNTER — Ambulatory Visit (INDEPENDENT_AMBULATORY_CARE_PROVIDER_SITE_OTHER): Payer: Medicare Other | Admitting: Orthopaedic Surgery

## 2021-03-28 ENCOUNTER — Other Ambulatory Visit: Payer: Self-pay

## 2021-03-28 ENCOUNTER — Ambulatory Visit (INDEPENDENT_AMBULATORY_CARE_PROVIDER_SITE_OTHER): Payer: Medicare Other

## 2021-03-28 DIAGNOSIS — M79672 Pain in left foot: Secondary | ICD-10-CM | POA: Diagnosis not present

## 2021-03-28 DIAGNOSIS — S92355A Nondisplaced fracture of fifth metatarsal bone, left foot, initial encounter for closed fracture: Secondary | ICD-10-CM

## 2021-03-28 NOTE — Progress Notes (Signed)
The patient is a 70 year old female who is now 5 weeks into the fifth metatarsal shaft fracture.  This occurred with minimal trauma after hitting a dog.  She had had some foot pain before.  She is ambulate in a postop shoe with weightbearing as tolerated.  She feels like she is doing well.  She has no concerns or complaints.  She has some pain but not severe.  Examination of her left foot does show some pain over the fifth metatarsal shaft but this is minimal.  There is no bruising in this area and the swelling.  NuPrep 3 views of the left foot are obtained and show a healing fifth metatarsal shaft fracture that is anatomically aligned.  She can transition to regular shoes when she is comfortable.  She will still hold off on any high impact aerobic activities or long walks.  We will see her back for potentially a final visit in 4 weeks with an AP and oblique view for 2 views of the left foot.

## 2021-04-15 DIAGNOSIS — Z20822 Contact with and (suspected) exposure to covid-19: Secondary | ICD-10-CM | POA: Diagnosis not present

## 2021-04-17 ENCOUNTER — Other Ambulatory Visit: Payer: Self-pay

## 2021-04-17 MED ORDER — DULOXETINE HCL 60 MG PO CPEP: 60.0000 mg | ORAL_CAPSULE | Freq: Every day | ORAL | 3 refills | Status: DC

## 2021-04-25 ENCOUNTER — Ambulatory Visit (INDEPENDENT_AMBULATORY_CARE_PROVIDER_SITE_OTHER): Payer: Medicare Other

## 2021-04-25 ENCOUNTER — Ambulatory Visit (INDEPENDENT_AMBULATORY_CARE_PROVIDER_SITE_OTHER): Payer: Medicare Other | Admitting: Orthopaedic Surgery

## 2021-04-25 ENCOUNTER — Encounter: Payer: Self-pay | Admitting: Orthopaedic Surgery

## 2021-04-25 ENCOUNTER — Other Ambulatory Visit: Payer: Self-pay

## 2021-04-25 DIAGNOSIS — S92355A Nondisplaced fracture of fifth metatarsal bone, left foot, initial encounter for closed fracture: Secondary | ICD-10-CM | POA: Diagnosis not present

## 2021-04-25 NOTE — Progress Notes (Signed)
HPI: Ms. Gavitt returns today now 9 weeks status post left fifth metatarsal shaft fracture.  She states she is doing well no complaints or concerns.  Still has some slight discomfort.  She is in a [place] flip-flop today.  Physical exam: Left foot no rashes or skin lesions.  She has some slight tenderness over the left fifth metatarsal shaft.  Also tenderness over the second third and fourth metatarsal heads.  Radiographs: Left foot 3 views shows Morton's type foot with the second third and fourth metatarsals albeit longer than the first.  There is no significant arthritic changes throughout left foot.  Fifth metatarsal shaft fracture shows some intermittent healing but fracture site is still apparent.  No other fractures identified.  No subluxations dislocations.  Impression: Left foot fifth metatarsal fracture  Plan: Recommended the pain in a insert for her shoe to offload her lateral column of both the.  Also discussed with her shoewear.  She should avoid open back shoes and this is discussed with her at length.  We will see her back in 4 weeks for 3 views of the left foot at that time.  Questions encouraged and answered at length

## 2021-04-26 ENCOUNTER — Ambulatory Visit: Payer: Medicare Other | Admitting: Sports Medicine

## 2021-05-08 NOTE — Telephone Encounter (Signed)
Please arrange for stool parasite panel and fecal calprotectin for a change in bowel habits. Please arrange for office visit with me or APP to further discuss these symptoms. Colonoscopy may be indicated as her last was in 2017. Thanks.  KLB

## 2021-05-10 ENCOUNTER — Other Ambulatory Visit: Payer: Self-pay

## 2021-05-10 DIAGNOSIS — R140 Abdominal distension (gaseous): Secondary | ICD-10-CM

## 2021-05-11 ENCOUNTER — Other Ambulatory Visit: Payer: Medicare Other

## 2021-05-18 ENCOUNTER — Other Ambulatory Visit: Payer: Medicare Other

## 2021-05-18 DIAGNOSIS — R140 Abdominal distension (gaseous): Secondary | ICD-10-CM

## 2021-05-23 LAB — OVA AND PARASITE EXAMINATION
CONCENTRATE RESULT:: NONE SEEN
MICRO NUMBER:: 12230737
SPECIMEN QUALITY:: ADEQUATE
TRICHROME RESULT:: NONE SEEN

## 2021-05-25 ENCOUNTER — Other Ambulatory Visit: Payer: Self-pay

## 2021-05-25 DIAGNOSIS — R140 Abdominal distension (gaseous): Secondary | ICD-10-CM

## 2021-05-26 ENCOUNTER — Other Ambulatory Visit: Payer: Self-pay | Admitting: Gastroenterology

## 2021-05-26 DIAGNOSIS — K219 Gastro-esophageal reflux disease without esophagitis: Secondary | ICD-10-CM

## 2021-05-29 ENCOUNTER — Ambulatory Visit (INDEPENDENT_AMBULATORY_CARE_PROVIDER_SITE_OTHER): Payer: Medicare Other | Admitting: Orthopaedic Surgery

## 2021-05-29 ENCOUNTER — Encounter: Payer: Self-pay | Admitting: Orthopaedic Surgery

## 2021-05-29 ENCOUNTER — Ambulatory Visit (INDEPENDENT_AMBULATORY_CARE_PROVIDER_SITE_OTHER): Payer: Medicare Other

## 2021-05-29 ENCOUNTER — Other Ambulatory Visit: Payer: Medicare Other

## 2021-05-29 DIAGNOSIS — S92355A Nondisplaced fracture of fifth metatarsal bone, left foot, initial encounter for closed fracture: Secondary | ICD-10-CM

## 2021-05-29 DIAGNOSIS — R140 Abdominal distension (gaseous): Secondary | ICD-10-CM | POA: Diagnosis not present

## 2021-05-29 NOTE — Progress Notes (Signed)
The patient is now 13 weeks into a left foot fifth metatarsal fracture.  She is an active 70 year old female.  She is doing well and reports no pain with her left foot at all.  Examination of her left foot shows no pain along the course of the fifth metatarsal which has a known stress fracture.  3 views left foot are reviewed and compared to previous films.  There is been abundant healing and I would say it is basically 100% healed based on her clinical exam combined with his x-rays findings.  She can increase her activities as comfort allows with no restrictions.  All questions and concerns were answered addressed.  Follow-up is as needed.

## 2021-05-30 DIAGNOSIS — B3784 Candidal otitis externa: Secondary | ICD-10-CM | POA: Diagnosis not present

## 2021-06-01 LAB — CALPROTECTIN, FECAL: Calprotectin, Fecal: 47 ug/g (ref 0–120)

## 2021-06-09 ENCOUNTER — Ambulatory Visit: Payer: Medicare Other | Admitting: Physician Assistant

## 2021-06-13 DIAGNOSIS — M79645 Pain in left finger(s): Secondary | ICD-10-CM | POA: Diagnosis not present

## 2021-06-13 DIAGNOSIS — J029 Acute pharyngitis, unspecified: Secondary | ICD-10-CM | POA: Diagnosis not present

## 2021-06-13 DIAGNOSIS — M180 Bilateral primary osteoarthritis of first carpometacarpal joints: Secondary | ICD-10-CM | POA: Diagnosis not present

## 2021-06-13 DIAGNOSIS — J0100 Acute maxillary sinusitis, unspecified: Secondary | ICD-10-CM | POA: Diagnosis not present

## 2021-06-27 ENCOUNTER — Other Ambulatory Visit: Payer: Self-pay | Admitting: Family Medicine

## 2021-06-30 ENCOUNTER — Ambulatory Visit: Payer: Medicare Other | Admitting: Gastroenterology

## 2021-07-03 ENCOUNTER — Encounter: Payer: Self-pay | Admitting: Gastroenterology

## 2021-07-25 ENCOUNTER — Other Ambulatory Visit: Payer: Self-pay | Admitting: Family Medicine

## 2021-07-25 DIAGNOSIS — N6310 Unspecified lump in the right breast, unspecified quadrant: Secondary | ICD-10-CM

## 2021-07-26 ENCOUNTER — Encounter: Payer: Self-pay | Admitting: Gastroenterology

## 2021-08-09 DIAGNOSIS — H16223 Keratoconjunctivitis sicca, not specified as Sjogren's, bilateral: Secondary | ICD-10-CM | POA: Diagnosis not present

## 2021-08-09 DIAGNOSIS — H353130 Nonexudative age-related macular degeneration, bilateral, stage unspecified: Secondary | ICD-10-CM | POA: Diagnosis not present

## 2021-08-09 DIAGNOSIS — Z961 Presence of intraocular lens: Secondary | ICD-10-CM | POA: Diagnosis not present

## 2021-08-09 DIAGNOSIS — H43393 Other vitreous opacities, bilateral: Secondary | ICD-10-CM | POA: Diagnosis not present

## 2021-08-09 DIAGNOSIS — H1013 Acute atopic conjunctivitis, bilateral: Secondary | ICD-10-CM | POA: Diagnosis not present

## 2021-08-10 ENCOUNTER — Ambulatory Visit (INDEPENDENT_AMBULATORY_CARE_PROVIDER_SITE_OTHER): Payer: Medicare Other | Admitting: Gastroenterology

## 2021-08-10 ENCOUNTER — Encounter: Payer: Self-pay | Admitting: Gastroenterology

## 2021-08-10 ENCOUNTER — Other Ambulatory Visit: Payer: Medicare Other

## 2021-08-10 VITALS — BP 120/70 | HR 84 | Ht 63.5 in | Wt 167.0 lb

## 2021-08-10 DIAGNOSIS — R1013 Epigastric pain: Secondary | ICD-10-CM | POA: Diagnosis not present

## 2021-08-10 DIAGNOSIS — K7581 Nonalcoholic steatohepatitis (NASH): Secondary | ICD-10-CM | POA: Diagnosis not present

## 2021-08-10 DIAGNOSIS — K219 Gastro-esophageal reflux disease without esophagitis: Secondary | ICD-10-CM

## 2021-08-10 DIAGNOSIS — R140 Abdominal distension (gaseous): Secondary | ICD-10-CM

## 2021-08-10 DIAGNOSIS — Z860100 Personal history of colon polyps, unspecified: Secondary | ICD-10-CM

## 2021-08-10 DIAGNOSIS — R748 Abnormal levels of other serum enzymes: Secondary | ICD-10-CM

## 2021-08-10 MED ORDER — ESOMEPRAZOLE MAGNESIUM 40 MG PO CPDR: 40.0000 mg | DELAYED_RELEASE_CAPSULE | Freq: Two times a day (BID) | ORAL | 3 refills | Status: DC

## 2021-08-10 NOTE — Patient Instructions (Signed)
If you are age 69 or older, your body mass index should be between 23-30. Your Body mass index is 29.12 kg/m. If this is out of the aforementioned range listed, please consider follow up with your Primary Care Provider.  If you are age 24 or younger, your body mass index should be between 19-25. Your Body mass index is 29.12 kg/m. If this is out of the aformentioned range listed, please consider follow up with your Primary Care Provider.   ________________________________________________________  The [facility] providers would like to encourage you to use [facility] to communicate with providers for non-urgent requests or questions.  Due to long hold times on the telephone, sending your provider a message by [facility] may be a faster and more efficient way to get a response.  Please allow 48 business hours for a response.  Please remember that this is for non-urgent requests.  _______________________________________________________  Your provider has requested that you go to the basement level for lab work before leaving today. Press "B" on the elevator. The lab is located at the first door on the left as you exit the elevator.  You have been scheduled for a colonoscopy. Please follow written instructions given to you at your visit today.  Please pick up your prep supplies at the pharmacy within the next 1-3 days. If you use inhalers (even only as needed), please bring them with you on the day of your procedure.  You will be contacted by [facility] in the next 2 days to arrange a [place] elastography.  The number on your caller ID will be [phone], please answer when they call.  If you have not heard from them in 2 days please call [phone] to schedule.

## 2021-08-10 NOTE — Progress Notes (Signed)
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 Lyndel Safe 10/20/2015    - Surveillance endoscopy recommended in 5 years (2022) Abnormal bile duct on ultrasound and MRI/MRCP    - no abnormality seen on EUS Duodenal intraepithelial lymphocytosis    - testing for celiac and CVID negative    - HLA DQ2/DQ8 negative Dyspepsia +/- GERD    - H pylori negative 12/09/18    - abdominal ultrasound 12/18/18: echogenic liver, no gallstones, no biliary abnormalities    - labs 12/09/18: normal liver enzymes except for ALT 40    - improved on BID PPI Bacterial overgrowth presenting with bloating    - Hydrogen and Methane Analysis 04/20/19: + baceterial overgrowth.        - Increase in hydrogen 28ppp, increase in Methan 4ppm       - Increase in combined hydrogen and methane 32ppm       - Xifaxan 550 mg TID and Neomycin 500 mg BID x 14 days in November 2021 NSAID-related gastric erosions on EGD 03/25/19    - biopsies negative for H pylori    - previously using Excedrin twice daily for headaches History of IBS with alternating diarrhea and constipation    - previously treated with Imodium    - currently using Metamucil Hemorrhoids  NASH diagnosis supported by Fibrosure and elastography with no significant fibrosis.  Supported by HOMA IR of 3.9. Given her history of insulin resistance, she is at risk for fibrosis. Will restage her liver disease using FibroSURE and elastography.   History of polyps: Surveillance colonoscopy due 2022.  Will schedule at her convenience.  Recent altered bowel habits: Now normal. Stool O&P and fecal calprotectin normal at the time.   Dyspepsia +/- GERD improved on twice daily PPI therapy and avoidance of NSAIDs. Not an active issue.   Duodenal intraepithelial lymphocytosis: The patient has discordant histologic findings suggestive of celiac but negative serologies and negative HLA-DQ2/DQ8 genotyping. No further evaluation for celiac indicated at this time.  Bacterial overgrowth responded to Xifaxan. Will need to plan retreatment with combination Xifaxan and Neomycin 500 mg BID with recurrent symptoms given her breath test results.   PLAN: - NASH FibroSURE - Elastography - Consider a trial avoiding all gluten for at least 2 weeks to see if your symptoms - Surveillance colonoscopy to be scheduled at her convenience - Annual follow-up for monitoring  HPI: Jodilyn Giese is a 70 y.o. recently-retired middle school teacher initially seen for dyspepsia, reflux, and abnormal liver enzymes. She has a history of IBS with alternating diarrhea and constipation, tubular adenoma removed on colonoscopy with Dr. Lyndel Safe in 2017 and sigmoid diverticulosis.   She had an EGD 03/25/19 that revealed reactive gastropathy and gastric erosions. Biopsies were negative for H pylori and celiac. Dyspepsia, postprandial vomiting, atypical chest pain, heartburn, dysphonia, cough, "esophageal rawness", and brash improved on twice daily PPI.   SIBO testing performed for persistent bloating with associated distension  and frequent eructation.  Hydrogen and Methane Analysis 04/20/19 + bacterial overgrowth. Increase in hydrogen 28ppp, increase in Methane 4ppm, increased in combained hydrogen and methane 32ppm. She was treatment with Xifaxan 550 mg TID and Neomycin 500 mg BID x 14 days. Symptoms improved while on treatment but returned after completing antibiotics.    Was found to have a persistently elevated ALT of 40.  Abdominal  ultrasound 12/18/2018 showed mildly increased echogenic liver. No other abnormalities seen. MRI/MRCP 12/27/19 showed diffuse fatty liver, CBD measuring 11 mm. No stones or stricture.  Staging of liver disease included: NASH FibroSURE 04/17/19:  F0 S3 Ferritin 56.6, iron 126 HOMA 3.9 [place] Elastography 04/30/19: F0/F1 with minimal fibrosis  Labs 06/24/20: TB 0.4, AST 70, ALT 74, alk phos 114 Labs 12/09/20: AST 44, ALT 52, alk phos 93, TB 0.6 Labs 02/09/21: AST 26, ALT 30, alk phos 89, TB 0.6  Abdominal ultrasound 07/05/20 to evaluate new onset pruritus, that has since resolved, showed fatty liver and increased CBD without intrahepatic ductal dilatation, new compared to 12/2018.  MRI/MRCP 07/28/20 showed diffuse hepatic steatosis, no hepatic mass, mild biliary ductal dilatation with CBD measuring 36m.   EGD with EUS performed 10/27/20 showed a normal bile duct, galbaldder sludge. However, duodenal biopsies showed intraepithelial lymphocytosis. Gastric biopsies showed gastropathy. Esophageal biopsies were normal.   Immunoglobulins A, G, M, and E were normal TTGA <1.0 HLA DQ2/DQ8 negative  At the time of follow-up 12/2020 continued to have gas and bloating, although symptoms have improved since losing weight. Finds that removing bone broth made the biggest difference. Notes some increased bloating when eating bread. She had lost 25 pounds with weight watchers.   Reached out in August 2022 for a change in bowel habits feeling like her stool was stringy with need for straining. She was concerned about rope worm after a search on the internet. Testing 05/2021 showed negative O&P and fecal calprotectin.    Returns in follow-up. She was last seen 12/09/20. Bowel habits have returned to normal. She will have time where she feels a low back pain while she is defecating. Rare bloating now that she lost 30 pounds with [employer]. Liver enzymes have normalized with her weight loss.  No new complaints or concerns.    She is enjoying retirement and spending time with her grandchildren.    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   Bilateral right worse than left that is mild in nature X-rays obtained in [place] on 07/12/2017 that are available through the [facility] PACS system   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   History of colon polyps    History of hyperthyroidism 2005   History of pneumonia, recurrent    History of seizure 2006   x1   IBS (irritable bowel syndrome)    IBS (irritable bowel syndrome)    Mixed hyperlipidemia    Nodule  of lower lobe of left lung 10/2018   48m;   pulmologist-- dr iMeda Coffee bLeory Plowman stable per lov note in epic   Ocular rosacea    OSA (obstructive sleep apnea)    per pt intolerant to cpap due to sinus issues,  uses mouth guard    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   [facility]    Pneumonia    Primary osteoarthritis of both hands 07/10/2017   PTSD (post-traumatic stress disorder)    Recurrent major depressive disorder, in full remission ([facility] [place] 06/26/2017   RLS (restless legs syndrome)    Seasonal allergies    Seizure ([facility] 2006   x 1   Solitary pulmonary nodule    pulmologist-- dr iMeda Coffee bLeory Plowman, lov note in epic,  stable   Spondylosis of lumbar region without myelopathy or radiculopathy 11/04/2017   MRI 11/01/2017: Severe L4-5 facet arthrosis on the right greater than left. Shallow disc bulging  without significant neuroforaminal stenosis.   SUI (stress urinary incontinence, female)    Vitamin B 12 deficiency    Vitamin D deficiency     Past Surgical History:  Procedure Laterality Date   ABDOMINOPLASTY  2006   ANAL FISSURE REPAIR  2006   BIOPSY  10/27/2020   Procedure: BIOPSY;  Surgeon: MRush LandmarkGTelford Nab, MD;  [place]  Service: Gastroenterology;;   BLADDER SURGERY  1984   BREAST BIOPSY Right     benign     CARPAL TUNNEL RELEASE Bilateral 1989   CATARACT EXTRACTION W/ INTRAOCULAR LENS  IMPLANT, BILATERAL  2017   COLONOSCOPY W/ POLYPECTOMY  2017   ECTOPIC PREGNANCY SURGERY  1979   ESOPHAGOGASTRODUODENOSCOPY (EGD) WITH PROPOFOL N/A 10/27/2020   Procedure: ESOPHAGOGASTRODUODENOSCOPY (EGD) WITH PROPOFOL;  Surgeon: MIrving Copas, MD;  [place] [facility]  Service: Gastroenterology;  Laterality: N/A;   EYE SURGERY  child    x4  1957-1960   INCONTINENCE SURGERY     NASAL SEPTUM SURGERY  1987   PUBOVAGINAL SLING N/A 03/24/2019   Procedure: CYSTOSCOPY  PGaynelle Arabian  Surgeon: MBjorn Loser MD;  [facility]  Service: Urology;  Laterality: N/A;   REVISION TOTAL HIP ARTHROPLASTY Right    RHINOPLASTY  2007   TOTAL ABDOMINAL HYSTERECTOMY W/ BILATERAL SALPINGOOPHORECTOMY  1984   w/ Marshall-Marchetti (bladder tack suspension)   TOTAL HIP ARTHROPLASTY Right 04/11/2018   Procedure: RIGHT TOTAL HIP ARTHROPLASTY ANTERIOR APPROACH;  Surgeon: BMcarthur Rossetti MD;  [facility];  Service: Orthopedics;  Laterality: Right;   ULNAR NERVE TRANSPOSITION Left 1990s   UPPER ESOPHAGEAL ENDOSCOPIC ULTRASOUND (EUS) N/A 10/27/2020   Procedure: UPPER ESOPHAGEAL ENDOSCOPIC ULTRASOUND (EUS);  Surgeon: MIrving Copas, MD;  [place] [facility]  Service: Gastroenterology;  Laterality: N/A;    Current Outpatient Medications  Medication Sig Dispense Refill   amLODipine (NORVASC) 5 MG tablet TAKE 1 TABLET DAILY (Patient  taking differently: Take 5 mg by mouth daily.) 90 tablet 3   calcium carbonate (OS-CAL) 600 MG TABS tablet Take 600 mg by mouth daily with breakfast.     cetirizine (ZYRTEC) 10 MG tablet Take 10 mg by mouth daily.     DULoxetine (CYMBALTA) 60 MG capsule TAKE 1 CAPSULE BY MOUTH EVERY DAY 90 capsule 3   esomeprazole (NEXIUM) 20 MG capsule Take 20 mg by mouth daily before breakfast.     FLUoxetine (PROZAC) 10 MG tablet Take 1 tablet (10 mg total) by mouth daily. 90 tablet  3   fluticasone (FLONASE) 50 MCG/ACT nasal spray USE 1 SPRAY IN EACH NOSTRIL DAILY AS NEEDED FOR ALLERGIES 48 g 3   gabapentin (NEURONTIN) 300 MG capsule TAKE 1 CAPSULE THREE TIMES A DAY (Patient taking differently: Take 300 mg by mouth 3 (three) times daily.) 270 capsule 3   Polyvinyl Alcohol-Povidone PF (REFRESH) 1.4-0.6 % SOLN Place 1 drop into both eyes daily as needed (Dry eye).     esomeprazole (NEXIUM) 40 MG capsule TAKE 1 CAPSULE TWICE A DAY BEFORE MEALS (Patient not taking: Reported on 08/10/2021) 60 capsule 0   No current facility-administered medications for this visit.    Allergies as of 08/10/2021 - Review Complete 08/10/2021  Allergen Reaction Noted   Codeine Itching 01/10/2016   Sulfa antibiotics Rash and Other (See Comments) 01/10/2016    Family History  Problem Relation Age of Onset   Hypertension Mother    Congestive Heart Failure Mother    Multiple myeloma Mother    Heart disease Father    Prostate cancer Father    Emphysema Sister    Heart disease Brother    Other Brother        brain tumor   Heart disease Maternal Grandmother    Emphysema Maternal Grandmother    CAD Maternal Grandfather    Heart disease Maternal Grandfather    Cancer Paternal Grandmother        type unknown, mets   Alcoholism Paternal Grandfather    Colon cancer Neg Hx    Esophageal cancer Neg Hx    Rectal cancer Neg Hx    Stomach cancer Neg Hx      Physical Exam: General:   Alert, in NAD. No scleral icterus. No  bilateral temporal wasting.  Abdomen:  Soft. Nontender. Nondistended. Normal bowel sounds. No rebound or guarding. No hepatosplenomegaly. LAD: No inguinal or umbilical LAD Extremities:  Without edema. Neurologic:  Alert and  oriented x4;  grossly normal neurologically; no asterixis or clonus. Skin: No jaundice. No palmar erythema or spider angioma.  Psych:  Alert and cooperative. Normal mood and affect.   Natally Ribera L. Tarri Glenn, MD, MPH [facility] 08/10/2021, 10:24 AM

## 2021-08-11 DIAGNOSIS — B9789 Other viral agents as the cause of diseases classified elsewhere: Secondary | ICD-10-CM | POA: Diagnosis not present

## 2021-08-11 DIAGNOSIS — R051 Acute cough: Secondary | ICD-10-CM | POA: Diagnosis not present

## 2021-08-14 ENCOUNTER — Ambulatory Visit (AMBULATORY_SURGERY_CENTER): Payer: Medicare Other | Admitting: Gastroenterology

## 2021-08-14 ENCOUNTER — Encounter: Payer: Self-pay | Admitting: Gastroenterology

## 2021-08-14 VITALS — BP 118/76 | HR 79 | Temp 98.2°F | Resp 22 | Ht 63.5 in | Wt 167.0 lb

## 2021-08-14 DIAGNOSIS — E119 Type 2 diabetes mellitus without complications: Secondary | ICD-10-CM | POA: Diagnosis not present

## 2021-08-14 DIAGNOSIS — D123 Benign neoplasm of transverse colon: Secondary | ICD-10-CM

## 2021-08-14 DIAGNOSIS — R140 Abdominal distension (gaseous): Secondary | ICD-10-CM | POA: Diagnosis not present

## 2021-08-14 DIAGNOSIS — I10 Essential (primary) hypertension: Secondary | ICD-10-CM | POA: Diagnosis not present

## 2021-08-14 DIAGNOSIS — D124 Benign neoplasm of descending colon: Secondary | ICD-10-CM | POA: Diagnosis not present

## 2021-08-14 DIAGNOSIS — Z86010 Personal history of colonic polyps: Secondary | ICD-10-CM | POA: Diagnosis not present

## 2021-08-14 DIAGNOSIS — G4733 Obstructive sleep apnea (adult) (pediatric): Secondary | ICD-10-CM | POA: Diagnosis not present

## 2021-08-14 DIAGNOSIS — K635 Polyp of colon: Secondary | ICD-10-CM | POA: Diagnosis not present

## 2021-08-14 DIAGNOSIS — K7581 Nonalcoholic steatohepatitis (NASH): Secondary | ICD-10-CM

## 2021-08-14 DIAGNOSIS — D128 Benign neoplasm of rectum: Secondary | ICD-10-CM

## 2021-08-14 DIAGNOSIS — D125 Benign neoplasm of sigmoid colon: Secondary | ICD-10-CM | POA: Diagnosis not present

## 2021-08-14 DIAGNOSIS — F329 Major depressive disorder, single episode, unspecified: Secondary | ICD-10-CM | POA: Diagnosis not present

## 2021-08-14 LAB — NASH FIBROSURE
ALPHA 2-MACROGLOBULINS, QN: 197 mg/dL (ref 110–276)
ALT (SGPT) P5P: 31 IU/L (ref 0–40)
AST (SGOT) P5P: 28 IU/L (ref 0–40)
Apolipoprotein A-1: 142 mg/dL (ref 116–209)
Bilirubin, Total: 0.2 mg/dL (ref 0.0–1.2)
Cholesterol, Total: 210 mg/dL — ABNORMAL HIGH (ref 100–199)
GGT: 27 IU/L (ref 0–60)
Glucose: 103 mg/dL — ABNORMAL HIGH (ref 70–99)
Haptoglobin: 169 mg/dL (ref 37–355)
Triglycerides: 170 mg/dL — ABNORMAL HIGH (ref 0–149)

## 2021-08-14 MED ORDER — SODIUM CHLORIDE 0.9 % IV SOLN: 500.0000 mL | Freq: Once | INTRAVENOUS | Status: DC

## 2021-08-14 NOTE — Op Note (Signed)
[facility] Patient Name: Margaret White Procedure Date: 08/14/2021 1:48 PM MRN: [id number] Endoscopist: Thornton Park MD, MD Age: 70 Referring MD:  Date of Birth: 1951-03-22 Gender: Female Account #: [id number] Procedure:                Colonoscopy Indications:              Surveillance: Personal history of adenomatous                            polyps on last colonoscopy 5 years ago                           Tubular adenoma removed by Dr. Lyndel Safe 10/20/2015                           Surveillance endoscopy recommended in 5 years Medicines:                Monitored Anesthesia Care Procedure:                Pre-Anesthesia Assessment:                           - Prior to the procedure, a History and Physical                            was performed, and patient medications and                            allergies were reviewed. The patient's tolerance of                            previous anesthesia was also reviewed. The risks                            and benefits of the procedure and the sedation                            options and risks were discussed with the patient.                            All questions were answered, and informed consent                            was obtained. Prior Anticoagulants: The patient has                            taken no previous anticoagulant or antiplatelet                            agents. ASA Grade Assessment: II - A patient with                            mild systemic disease. After reviewing the risks  and benefits, the patient was deemed in                            satisfactory condition to undergo the procedure.                           After obtaining informed consent, the colonoscope                            was passed under direct vision. Throughout the                            procedure, the patient's blood pressure, pulse, and                            oxygen saturations were  monitored continuously. The                            CF [id number] #[id number] was introduced through the anus                            and advanced to the 3 cm into the ileum. A second                            forward view of the right colon was performed. The                            colonoscopy was performed with moderate difficulty                            due to a redundant colon and significant looping.                            Successful completion of the procedure was aided by                            applying abdominal pressure. The patient tolerated                            the procedure well. The quality of the bowel                            preparation was good. The terminal ileum, ileocecal                            valve, appendiceal orifice, and rectum were                            photographed. Scope In: 2:00:05 PM Scope Out: 2:22:39 PM Scope Withdrawal Time: 0 hours 19 minutes 15 seconds  Total Procedure Duration: 0 hours 22 minutes 34 seconds  Findings:                 The perianal and digital rectal examinations  were                            normal.                           Non-bleeding internal hemorrhoids were found. The                            hemorrhoids were small.                           A few small-mouthed diverticula were found in the                            sigmoid colon.                           Two sessile polyps were found in the rectum. The                            polyps were 2 to 5 mm in size. These polyps were                            removed with a cold snare. Resection and retrieval                            were complete. Estimated blood loss was minimal.                           A 2 mm polyp was found in the sigmoid colon. The                            polyp was sessile. The polyp was removed with a                            cold snare. Resection and retrieval were complete.                            Estimated  blood loss was minimal.                           Two sessile polyps were found in the descending                            colon. The polyps were 3 to 5 mm in size. These                            polyps were removed with a cold snare. Resection                            and retrieval were complete. Estimated blood loss  was minimal.                           A 12 mm polyp was found in the hepatic flexure. The                            polyp was flat. The polyp was removed with a                            piecemeal technique using a cold snare. Resection                            and retrieval were complete. Estimated blood loss                            was minimal.                           The exam was otherwise without abnormality on                            direct and retroflexion views. Complications:            No immediate complications. Estimated blood loss:                            Minimal. Estimated Blood Loss:     Estimated blood loss was minimal. Impression:               - Non-bleeding internal hemorrhoids.                           - Diverticulosis in the sigmoid colon.                           - Two 2 to 5 mm polyps in the rectum, removed with                            a cold snare. Resected and retrieved.                           - One 2 mm polyp in the sigmoid colon, removed with                            a cold snare. Resected and retrieved.                           - Two 3 to 5 mm polyps in the descending colon,                            removed with a cold snare. Resected and retrieved.                           - One 12 mm polyp at the hepatic flexure, removed  piecemeal using a cold snare. Resected and                            retrieved.                           - The examination was otherwise normal on direct                            and retroflexion views. Recommendation:           -  Patient has a contact number available for                            emergencies. The signs and symptoms of potential                            delayed complications were discussed with the                            patient. Return to normal activities tomorrow.                            Written discharge instructions were provided to the                            patient.                           - High fiber diet.                           - Continue present medications.                           - Await pathology results.                           - Repeat colonoscopy date to be determined after                            pending pathology results are reviewed for                            surveillance.                           - Emerging evidence supports eating a diet of                            fruits, vegetables, grains, calcium, and yogurt                            while reducing red meat and alcohol may reduce the                            risk of colon cancer.                           -  Thank you for allowing me to be involved in your                            colon cancer prevention. Thornton Park MD, MD 08/14/2021 2:29:36 PM This report has been signed electronically.

## 2021-08-14 NOTE — Progress Notes (Signed)
VS taken by DT 

## 2021-08-14 NOTE — Progress Notes (Signed)
Called to room to assist during endoscopic procedure.  Patient ID and intended procedure confirmed with present staff. Received instructions for my participation in the procedure from the performing physician.  

## 2021-08-14 NOTE — Patient Instructions (Signed)
YOU HAD AN ENDOSCOPIC PROCEDURE TODAY AT THE [facility]:   Refer to the procedure report that was given to you for any specific questions about what was found during the examination.  If the procedure report does not answer your questions, please call your gastroenterologist to clarify.  If you requested that your care partner not be given the details of your procedure findings, then the procedure report has been included in a sealed envelope for you to review at your convenience later.  YOU SHOULD EXPECT: Some feelings of bloating in the abdomen. Passage of more gas than usual.  Walking can help get rid of the air that was put into your GI tract during the procedure and reduce the bloating. If you had a lower endoscopy (such as a colonoscopy or flexible sigmoidoscopy) you may notice spotting of blood in your stool or on the toilet paper. If you underwent a bowel prep for your procedure, you may not have a normal bowel movement for a few days.  Please Note:  You might notice some irritation and congestion in your nose or some drainage.  This is from the oxygen used during your procedure.  There is no need for concern and it should clear up in a day or so.  SYMPTOMS TO REPORT IMMEDIATELY:   Following lower endoscopy (colonoscopy or flexible sigmoidoscopy):  Excessive amounts of blood in the stool  Significant tenderness or worsening of abdominal pains  Swelling of the abdomen that is new, acute  Fever of 100F or higher   Following upper endoscopy (EGD)  Vomiting of blood or coffee ground material  New chest pain or pain under the shoulder blades  Painful or persistently difficult swallowing  New shortness of breath  Fever of 100F or higher  Black, tarry-looking stools  For urgent or emergent issues, a gastroenterologist can be reached at any hour by calling [phone]. Do not use MyChart messaging for urgent concerns.    DIET:  We do recommend a small meal at first, but  then you may proceed to your regular diet.  Drink plenty of fluids but you should avoid alcoholic beverages for 24 hours.  ACTIVITY:  You should plan to take it easy for the rest of today and you should NOT DRIVE or use heavy machinery until tomorrow (because of the sedation medicines used during the test).    FOLLOW UP: Our staff will call the number listed on your records 48-72 hours following your procedure to check on you and address any questions or concerns that you may have regarding the information given to you following your procedure. If we do not reach you, we will leave a message.  We will attempt to reach you two times.  During this call, we will ask if you have developed any symptoms of COVID 19. If you develop any symptoms (ie: fever, flu-like symptoms, shortness of breath, cough etc.) before then, please call [phone].  If you test positive for Covid 19 in the 2 weeks post procedure, please call and report this information to us.    If any biopsies were taken you will be contacted by phone or by letter within the next 1-3 weeks.  Please call us at [phone] if you have not heard about the biopsies in 3 weeks.    SIGNATURES/CONFIDENTIALITY: You and/or your care partner have signed paperwork which will be entered into your electronic medical record.  These signatures attest to the fact that that the information above on   your After Visit Summary has been reviewed and is understood.  Full responsibility of the confidentiality of this discharge information lies with you and/or your care-partner. 

## 2021-08-14 NOTE — Progress Notes (Signed)
Report to PACU, RN, vss, BBS= Clear.  

## 2021-08-14 NOTE — Progress Notes (Signed)
Referring Provider: Leamon Arnt, MD Primary Care Physician:  Leamon Arnt, MD  Reason for Procedure:  Colon cancer screening   IMPRESSION:  Personal history of colon polyps    - Polyps removed on colonoscopy in [place]    - Tubular adenoma removed by Dr. Lyndel Safe 10/20/2015    - Surveillance endoscopy recommended in 5 years (2022) Appropriate candidate for monitored anesthesia care  PLAN: [place]   HPI: Margaret White is a 70 y.o. female presents for surveillance colonoscopy.  Tubular adenoma on colonoscopy with Dr. Lyndel Safe 2017. Surveillance recommended in 5 years.   Please see my 08/10/21 office note for full and recent GI history.   No known family history of colon cancer or polyps. No family history of uterine/endometrial cancer, pancreatic cancer or gastric/stomach cancer.   Past Medical History:  Diagnosis Date   Allergic rhinitis    Anxiety    Asthma    Avascular necrosis of hip, right ([place]) 03/24/2018   Chronic cluster headache    Chronic sinusitis    Diabetes mellitus type 2, diet-controlled ([place])    per pt currently no taking metformin, followed by pcp   Eczema    Essential hypertension    cardiologsit-- dr dr Bettina Gavia   Fatty liver    Fibromyalgia    GERD (gastroesophageal reflux disease)    Hepatic steatosis 10/14/2017   Hiatal hernia    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   Bilateral right worse than left that is mild in nature X-rays obtained in [place] on 07/12/2017 that are available through the [facility] PACS system   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   History of colon polyps    History of hyperthyroidism 2005   History of pneumonia, recurrent    History of seizure 2006   x1   IBS (irritable bowel syndrome)    IBS (irritable bowel syndrome)    Mixed hyperlipidemia    Nodule of lower lobe of left lung 10/2018   2m;   pulmologist-- dr iMeda Coffee bLeory Plowman stable per lov note in epic   Ocular  rosacea    OSA (obstructive sleep apnea)    per pt intolerant to cpap due to sinus issues,  uses mouth guard    [place] 11/05[place]   [facility]    Pneumonia    Primary osteoarthritis of both hands 07/10/2017   PTSD (post-traumatic stress disorder)    Recurrent major depressive disorder, [place] full remission [place] 06/26/2017   RLS (restless legs syndrome)    Seasonal allergies    Seizure [place]   x 1   Solitary pulmonary nodule    pulmologist-- dr iMeda Coffee bLeory Plowman, lov note in epic,  stable   Spondylosis of lumbar region without myelopathy or radiculopathy 11/04/2017   MRI 11/01/2017: Severe L4-5 facet arthrosis on the right greater than left. Shallow disc bulging without significant neuroforaminal stenosis.   SUI (stress urinary incontinence, female)    Vitamin B 12 deficiency    Vitamin D deficiency     Past Surgical History:  Procedure Laterality Date   ABDOMINOPLASTY  2006   ANAL FISSURE REPAIR  2006   BIOPSY  10/27/2020   Procedure: BIOPSY;  Surgeon: MRush LandmarkGTelford Nab, MD;  [facility];  Service: Gastroenterology;;   BLADDER SURGERY  1984   BREAST BIOPSY Right     benign  CARPAL TUNNEL RELEASE Bilateral 1989   CATARACT EXTRACTION W/ INTRAOCULAR LENS  IMPLANT, BILATERAL  2017   COLONOSCOPY W/ POLYPECTOMY  2017   ECTOPIC PREGNANCY SURGERY  1979   ESOPHAGOGASTRODUODENOSCOPY (EGD) WITH PROPOFOL N/A 10/27/2020   Procedure: ESOPHAGOGASTRODUODENOSCOPY (EGD) WITH PROPOFOL;  Surgeon: Irving Copas., MD;  Location: [facility];  Service: Gastroenterology;  Laterality: N/A;   EYE SURGERY  child    x4  1957-1960   INCONTINENCE SURGERY     NASAL SEPTUM SURGERY  1987   PUBOVAGINAL SLING N/A 03/24/2019   Procedure: CYSTOSCOPY  Gaynelle Arabian;  Surgeon: Bjorn Loser, MD;  [facility];  Service: Urology;  Laterality: N/A;   REVISION TOTAL HIP  ARTHROPLASTY Right    RHINOPLASTY  2007   TOTAL ABDOMINAL HYSTERECTOMY W/ BILATERAL SALPINGOOPHORECTOMY  1984   w/ Marshall-Marchetti (bladder tack suspension)   TOTAL HIP ARTHROPLASTY Right 04/11/2018   Procedure: RIGHT TOTAL HIP ARTHROPLASTY ANTERIOR APPROACH;  Surgeon: Mcarthur Rossetti, MD;  Location[facility];  Service: Orthopedics;  Laterality: Right;   ULNAR NERVE TRANSPOSITION Left 1990s   UPPER ESOPHAGEAL ENDOSCOPIC ULTRASOUND (EUS) N/A 10/27/2020   Procedure: UPPER ESOPHAGEAL ENDOSCOPIC ULTRASOUND (EUS);  Surgeon: Irving Copas., MD;  Location: [facility];  Service: Gastroenterology;  Laterality: N/A;    Current Outpatient Medications  Medication Sig Dispense Refill   amLODipine (NORVASC) 5 MG tablet TAKE 1 TABLET DAILY (Patient taking differently: Take 5 mg by mouth daily.) 90 tablet 3   calcium carbonate (OS-CAL) 600 MG TABS tablet Take 600 mg by mouth daily with breakfast.     cetirizine (ZYRTEC) 10 MG tablet Take 10 mg by mouth daily.     DULoxetine (CYMBALTA) 60 MG capsule TAKE 1 CAPSULE BY MOUTH EVERY DAY 90 capsule 3   esomeprazole (NEXIUM) 40 MG capsule Take 1 capsule (40 mg total) by mouth 2 (two) times daily before a meal. 180 capsule 3   FLUoxetine (PROZAC) 10 MG tablet Take 1 tablet (10 mg total) by mouth daily. 90 tablet 3   fluticasone (FLONASE) 50 MCG/ACT nasal spray USE 1 SPRAY IN EACH NOSTRIL DAILY AS NEEDED FOR ALLERGIES 48 g 3   gabapentin (NEURONTIN) 300 MG capsule TAKE 1 CAPSULE THREE TIMES A DAY (Patient taking differently: Take 300 mg by mouth 3 (three) times daily.) 270 capsule 3   Polyvinyl Alcohol-Povidone PF (REFRESH) 1.4-0.6 % SOLN Place 1 drop into both eyes daily as needed (Dry eye).     No current facility-administered medications for this visit.    Allergies as of 08/14/2021 - Review Complete 08/10/2021  Allergen Reaction Noted   Codeine Itching 01/10/2016   Sulfa antibiotics Rash and Other (See Comments) 01/10/2016    Family  History  Problem Relation Age of Onset   Hypertension Mother    Congestive Heart Failure Mother    Multiple myeloma Mother    Heart disease Father    Prostate cancer Father    Emphysema Sister    Heart disease Brother    Other Brother        brain tumor   Heart disease Maternal Grandmother    Emphysema Maternal Grandmother    CAD Maternal Grandfather    Heart disease Maternal Grandfather    Cancer Paternal Grandmother        type unknown, mets   Alcoholism Paternal Grandfather    Colon cancer Neg Hx    Esophageal cancer Neg Hx    Rectal cancer Neg Hx    Stomach cancer Neg  Hx      Physical Exam: General:   Alert,  well-nourished, pleasant and cooperative in NAD Head:  Normocephalic and atraumatic. Eyes:  Sclera clear, no icterus.   Conjunctiva pink. Mouth:  No deformity or lesions.   Neck:  Supple; no masses or thyromegaly. Lungs:  Clear throughout to auscultation.   No wheezes. Heart:  Regular rate and rhythm; no murmurs. Abdomen:  Soft, non-tender, nondistended, normal bowel sounds, no rebound or guarding.  Msk:  Symmetrical. No boney deformities LAD: No inguinal or umbilical LAD Extremities:  No clubbing or edema. Neurologic:  Alert and  oriented x4;  grossly nonfocal Skin:  No obvious rash or bruise. Psych:  Alert and cooperative. Normal mood and affect.    Kimberly L. Tarri Glenn, MD, MPH 08/14/2021, 1:23 PM

## 2021-08-15 ENCOUNTER — Ambulatory Visit (INDEPENDENT_AMBULATORY_CARE_PROVIDER_SITE_OTHER): Payer: Medicare Other | Admitting: Family Medicine

## 2021-08-15 ENCOUNTER — Other Ambulatory Visit: Payer: Self-pay

## 2021-08-15 ENCOUNTER — Encounter: Payer: Self-pay | Admitting: Family Medicine

## 2021-08-15 VITALS — BP 120/78 | HR 87 | Temp 98.2°F | Ht 64.0 in | Wt 166.2 lb

## 2021-08-15 DIAGNOSIS — R7303 Prediabetes: Secondary | ICD-10-CM

## 2021-08-15 DIAGNOSIS — G4761 Periodic limb movement disorder: Secondary | ICD-10-CM

## 2021-08-15 DIAGNOSIS — K7581 Nonalcoholic steatohepatitis (NASH): Secondary | ICD-10-CM

## 2021-08-15 DIAGNOSIS — E782 Mixed hyperlipidemia: Secondary | ICD-10-CM

## 2021-08-15 DIAGNOSIS — I10 Essential (primary) hypertension: Secondary | ICD-10-CM | POA: Diagnosis not present

## 2021-08-15 DIAGNOSIS — J329 Chronic sinusitis, unspecified: Secondary | ICD-10-CM | POA: Diagnosis not present

## 2021-08-15 DIAGNOSIS — D126 Benign neoplasm of colon, unspecified: Secondary | ICD-10-CM

## 2021-08-15 DIAGNOSIS — K6389 Other specified diseases of intestine: Secondary | ICD-10-CM | POA: Diagnosis not present

## 2021-08-15 LAB — POCT GLYCOSYLATED HEMOGLOBIN (HGB A1C): Hemoglobin A1C: 5.6 % (ref 4.0–5.6)

## 2021-08-15 MED ORDER — EZETIMIBE 10 MG PO TABS: 10.0000 mg | ORAL_TABLET | Freq: Every day | ORAL | 3 refills | Status: DC

## 2021-08-15 MED ORDER — GUAIFENESIN-CODEINE 100-10 MG/5ML PO SOLN: 5.0000 mL | Freq: Four times a day (QID) | ORAL | 0 refills | Status: DC | PRN

## 2021-08-15 NOTE — Patient Instructions (Addendum)
Please return in 6 months for your annual complete physical; please come fasting.   Start the zetia to lower your cholesterol. It will help and you should be able to tolerate it well.  You may look up Periodic Limb movement disorder: typically we treat IF you are also having restless leg symptoms.   Let me know if your sinus symptoms do not resolve.   If you have any questions or concerns, please don't hesitate to send me a message via MyChart or call the office at [phone]. Thank you for visiting with [place] today! It's our pleasure caring for you.

## 2021-08-15 NOTE — Progress Notes (Signed)
Subjective  CC:  Chief Complaint  Patient presents with   Hypertension   Hyperlipidemia   Depression    HPI: Margaret White is a 70 y.o. female who presents to the office today to address the problems listed above in the chief complaint. Hypertension f/u: Control is good . Pt reports she is doing well. taking medications as instructed, no medication side effects noted, no TIAs, no chest pain on exertion, no dyspnea on exertion, no swelling of ankles. She retired and bp has responded nicely. She denies adverse effects from his BP medications. Compliance with medication is good.  HLD: intolerant to statins. Fatty liver and +FH of CAD Reviewed GI's extensive notes: IBS, SIBO, colon polyps s/p colonoscopy yesterday:hems, tics and polyps, path pending, NASH w/o fibrosis Prediabetes: pt has lost weight and is eating better Has allergy flare/sinusistis: x 1 week;improving but cough is difficult C/o limb mvt at night: awakens with pillow that was between her legs on floor and pants up to knees etc; also c/o feet staying cold. No RLS sxs.   Assessment  1. Essential hypertension   2. Mixed hyperlipidemia   3. Small intestinal bacterial overgrowth (SIBO)   4. NASH (nonalcoholic steatohepatitis)   5. Tubular adenoma of colon   6. Prediabetes   7. Periodic limb movement sleep disorder   8. Chronic sinusitis, unspecified location      Plan   Hypertension f/u: BP control is well controlled. continuemeds Hyperlipidemia f/u: educated; start zetia GI: active SIBO sxs. Await path on colon polyps.  Prediabetes: resolving with diet changes and weight loss Sinuses: improving; rob AC for cough. Would start abx if not resolved over the remainder of this week Limb mvt disorder of sleep: education given. Not having RLS sxs. Continue gabapentin. Reassured  Education regarding management of these chronic disease states was given. Management strategies discussed on successive visits include dietary and  exercise recommendations, goals of achieving and maintaining IBW, and lifestyle modifications aiming for adequate sleep and minimizing stressors.   Follow up: 6 mo for cpe  Orders Placed This Encounter  Procedures   POCT HgB A1C   Meds ordered this encounter  Medications   ezetimibe (ZETIA) 10 MG tablet    Sig: Take 1 tablet (10 mg total) by mouth daily.    Dispense:  90 tablet    Refill:  3   guaiFENesin-codeine 100-10 MG/5ML syrup    Sig: Take 5 mLs by mouth every 6 (six) hours as needed for cough.    Dispense:  120 mL    Refill:  0      BP Readings from Last 3 Encounters:  08/15/21 120/78  08/14/21 (P) 122/78  08/10/21 120/70   Wt Readings from Last 3 Encounters:  08/15/21 166 lb 3.2 oz (75.4 kg)  08/14/21 167 lb (75.8 kg)  08/10/21 167 lb (75.8 kg)    Lab Results  Component Value Date   CHOL 210 (H) 08/10/2021   CHOL 208 (H) 02/09/2021   CHOL 190 02/08/2020   Lab Results  Component Value Date   HDL 48.30 02/09/2021   HDL 49.30 02/08/2020   HDL 55.10 01/22/2019   Lab Results  Component Value Date   LDLCALC 104 (H) 02/08/2020   LDLCALC 108 (H) 07/30/2018   LDLCALC 155 (H) 10/14/2017   Lab Results  Component Value Date   TRIG 170 (H) 08/10/2021   TRIG 211.0 (H) 02/09/2021   TRIG 185.0 (H) 02/08/2020   Lab Results  Component Value Date  CHOLHDL 4 02/09/2021   CHOLHDL 4 02/08/2020   CHOLHDL 4 01/22/2019   Lab Results  Component Value Date   LDLDIRECT 111.0 02/09/2021   LDLDIRECT 125.0 01/22/2019   Lab Results  Component Value Date   CREATININE 0.98 02/09/2021   BUN 16 02/09/2021   NA 142 02/09/2021   K 4.3 02/09/2021   CL 105 02/09/2021   CO2 28 02/09/2021   Lab Results  Component Value Date   HGBA1C 5.6 08/15/2021   HGBA1C 6.2 02/09/2021   HGBA1C 5.8 (A) 08/10/2020      The 10-year ASCVD risk score (Arnett DK, et al., 2019) is: 9.5%   Values used to calculate the score:     Age: 74 years     Sex: Female     Is Non-Hispanic  African American: No     Diabetic: No     Tobacco smoker: No     Systolic Blood Pressure: 174 mmHg     Is BP treated: Yes     HDL Cholesterol: 48.3 mg/dL     Total Cholesterol: 210 mg/dL  I reviewed the patients updated PMH, FH, and SocHx.    Patient Active Problem List   Diagnosis Date Noted   Prediabetes 02/09/2021    Priority: High   Tubular adenoma of colon 08/19/2019    Priority: High   Statin intolerance 03/25/2018    Priority: High   GERD (gastroesophageal reflux disease) 10/14/2017    Priority: High   NASH (nonalcoholic steatohepatitis) 10/14/2017    Priority: High   OSA (obstructive sleep apnea) 10/14/2017    Priority: High   Fibromyalgia 10/14/2017    Priority: High   Obesity (BMI 30-39.9) 10/14/2017    Priority: High   Mixed hyperlipidemia 10/14/2017    Priority: High   Essential hypertension 06/26/2017    Priority: High   Incontinence without sensory awareness 05/08/2018    Priority: Medium    Mixed incontinence 05/08/2018    Priority: Medium    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    Priority: Medium    Bilateral primary osteoarthritis of hip 11/05/2017    Priority: Medium    Spondylosis of lumbar region without myelopathy or radiculopathy 11/04/2017    Priority: Medium    IBS (irritable bowel syndrome) 10/14/2017    Priority: Medium    Recurrent major depressive disorder, in full remission ([place]) 06/26/2017    Priority: Medium    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    Priority: Low   Allergic rhinitis 10/14/2017    Priority: Low   History of cluster headache 10/14/2017    Priority: Low   Chronic maxillary sinusitis 10/14/2017    Priority: Low   Vitamin B12 deficiency 10/14/2017    Priority: Low   Vitamin D deficiency 10/14/2017    Priority: Low   Female cystocele 10/14/2017    Priority: Low   Primary osteoarthritis of both hands 07/10/2017    Priority: Low   Periodic limb movement sleep disorder 08/15/2021    Allergies: Codeine and  Sulfa antibiotics  Social History: Patient  reports that she has never smoked. She has never used smokeless tobacco. She reports that she does not drink alcohol and does not use drugs.  Current Meds  Medication Sig   amLODipine (NORVASC) 5 MG tablet TAKE 1 TABLET DAILY (Patient taking differently: Take 5 mg by mouth daily.)   calcium carbonate (OS-CAL) 600 MG TABS tablet Take 600 mg by mouth daily with breakfast.   cetirizine (ZYRTEC) 10 MG  tablet Take 10 mg by mouth daily.   DULoxetine (CYMBALTA) 60 MG capsule TAKE 1 CAPSULE BY MOUTH EVERY DAY   esomeprazole (NEXIUM) 40 MG capsule Take 1 capsule (40 mg total) by mouth 2 (two) times daily before a meal.   ezetimibe (ZETIA) 10 MG tablet Take 1 tablet (10 mg total) by mouth daily.   FLUoxetine (PROZAC) 10 MG tablet Take 1 tablet (10 mg total) by mouth daily.   fluticasone (FLONASE) 50 MCG/ACT nasal spray USE 1 SPRAY IN EACH NOSTRIL DAILY AS NEEDED FOR ALLERGIES   gabapentin (NEURONTIN) 300 MG capsule TAKE 1 CAPSULE THREE TIMES A DAY (Patient taking differently: Take 300 mg by mouth 3 (three) times daily.)   guaiFENesin-codeine 100-10 MG/5ML syrup Take 5 mLs by mouth every 6 (six) hours as needed for cough.   Polyvinyl Alcohol-Povidone PF (REFRESH) 1.4-0.6 % SOLN Place 1 drop into both eyes daily as needed (Dry eye).    Review of Systems: Cardiovascular: negative for chest pain, palpitations, leg swelling, orthopnea Respiratory: negative for SOB, wheezing or persistent cough Gastrointestinal: negative for abdominal pain Genitourinary: negative for dysuria or gross hematuria  Objective  Vitals: BP 120/78   Pulse 87   Temp 98.2 F (36.8 C) (Temporal)   Ht 5' 4"  (1.626 m)   Wt 166 lb 3.2 oz (75.4 kg)   SpO2 95%   BMI 28.53 kg/m  General: no acute distress , congested Psych:  Alert and oriented, normal mood and affect, no sinus ttp HEENT:  Normocephalic, atraumatic, supple neck  Cardiovascular:  RRR without murmur. no  edema Respiratory:  Good breath sounds bilaterally, CTAB with normal respiratory effort Skin:  Warm, no rashes Neurologic:   Mental status is normal Commons side effects, risks, benefits, and alternatives for medications and treatment plan prescribed today were discussed, and the patient expressed understanding of the given instructions. Patient is instructed to call or message via MyChart if he/she has any questions or concerns regarding our treatment plan. No barriers to understanding were identified. We discussed Red Flag symptoms and signs in detail. Patient expressed understanding regarding what to do in case of urgent or emergency type symptoms.  Medication list was reconciled, printed and provided to the patient in AVS. Patient instructions and summary information was reviewed with the patient as documented in the AVS. This note was prepared with assistance of Dragon voice recognition software. Occasional wrong-word or sound-a-like substitutions may have occurred due to the inherent limitations of voice recognition software  This visit occurred during the SARS-CoV-2 public health emergency.  Safety protocols were in place, including screening questions prior to the visit, additional usage of staff PPE, and extensive cleaning of exam room while observing appropriate contact time as indicated for disinfecting solutions.

## 2021-08-16 ENCOUNTER — Telehealth: Payer: Self-pay

## 2021-08-16 NOTE — Telephone Encounter (Signed)
  Follow up Call-  Call back number 08/14/2021 03/25/2019  Post procedure Call Back phone  # [phone]  Permission to leave phone message Yes Yes  Some recent data might be hidden     Patient questions:  Do you have a fever, pain , or abdominal swelling? No. Pain Score  0 *  Have you tolerated food without any problems? Yes.    Have you been able to return to your normal activities? Yes.    Do you have any questions about your discharge instructions: Diet   No. Medications  No. Follow up visit  No.  Do you have questions or concerns about your Care? No.  Actions: * If pain score is 4 or above: No action needed, pain <4.  Have you developed a fever since your procedure? no  2.   Have you had an respiratory symptoms (SOB or cough) since your procedure? no  3.   Have you tested positive for COVID 19 since your procedure no  4.   Have you had any family members/close contacts diagnosed with the COVID 19 since your procedure?  no   If yes to any of these questions please route to Joylene John, RN and Joella Prince, RN

## 2021-08-16 NOTE — Telephone Encounter (Signed)
Left message on follow up call. 

## 2021-08-17 ENCOUNTER — Telehealth: Payer: Self-pay

## 2021-08-17 ENCOUNTER — Encounter: Payer: Self-pay | Admitting: Gastroenterology

## 2021-08-17 ENCOUNTER — Other Ambulatory Visit: Payer: Medicare Other

## 2021-08-17 ENCOUNTER — Other Ambulatory Visit: Payer: Self-pay | Admitting: Family Medicine

## 2021-08-17 MED ORDER — AMOXICILLIN 875 MG PO TABS: 875.0000 mg | ORAL_TABLET | Freq: Two times a day (BID) | ORAL | 0 refills | Status: AC

## 2021-08-17 NOTE — Telephone Encounter (Signed)
Patient states her sinus infection is not getting any better and was told that Dr. Jonni Sanger would send in a antibiotic for the patient and she would like it sent to  [facility], [place] [address] AT [place] Phone:  [phone]  Fax:  [phone]

## 2021-08-17 NOTE — Telephone Encounter (Signed)
Medication has been sent in

## 2021-08-17 NOTE — Telephone Encounter (Signed)
Patient would like ezetimibe (ZETIA) 10 MG tablet for know on like this medication refilled with   [facility], [place] Phone:  [phone]  Fax:  [phone]

## 2021-08-17 NOTE — Telephone Encounter (Signed)
Patient is to take medication first to see how she reacts to it before sending it to long term pharmacy

## 2021-08-18 ENCOUNTER — Ambulatory Visit (HOSPITAL_COMMUNITY)
Admission: RE | Admit: 2021-08-18 | Discharge: 2021-08-18 | Disposition: A | Discharge status: Home / Self Care | Payer: Medicare Other | Source: Ambulatory Visit | Attending: Gastroenterology | Admitting: Gastroenterology

## 2021-08-18 ENCOUNTER — Other Ambulatory Visit: Payer: Self-pay

## 2021-08-18 DIAGNOSIS — K7581 Nonalcoholic steatohepatitis (NASH): Secondary | ICD-10-CM | POA: Insufficient documentation

## 2021-08-18 DIAGNOSIS — R1013 Epigastric pain: Secondary | ICD-10-CM | POA: Diagnosis not present

## 2021-08-18 DIAGNOSIS — Z86010 Personal history of colonic polyps: Secondary | ICD-10-CM | POA: Insufficient documentation

## 2021-08-18 DIAGNOSIS — K838 Other specified diseases of biliary tract: Secondary | ICD-10-CM | POA: Diagnosis not present

## 2021-08-18 DIAGNOSIS — R140 Abdominal distension (gaseous): Secondary | ICD-10-CM | POA: Diagnosis not present

## 2021-08-18 DIAGNOSIS — R748 Abnormal levels of other serum enzymes: Secondary | ICD-10-CM | POA: Insufficient documentation

## 2021-08-18 DIAGNOSIS — K760 Fatty (change of) liver, not elsewhere classified: Secondary | ICD-10-CM | POA: Diagnosis not present

## 2021-08-23 DIAGNOSIS — Z961 Presence of intraocular lens: Secondary | ICD-10-CM | POA: Diagnosis not present

## 2021-08-23 DIAGNOSIS — G43B0 Ophthalmoplegic migraine, not intractable: Secondary | ICD-10-CM | POA: Diagnosis not present

## 2021-08-23 DIAGNOSIS — H16223 Keratoconjunctivitis sicca, not specified as Sjogren's, bilateral: Secondary | ICD-10-CM | POA: Diagnosis not present

## 2021-08-23 DIAGNOSIS — H353130 Nonexudative age-related macular degeneration, bilateral, stage unspecified: Secondary | ICD-10-CM | POA: Diagnosis not present

## 2021-08-23 DIAGNOSIS — H1013 Acute atopic conjunctivitis, bilateral: Secondary | ICD-10-CM | POA: Diagnosis not present

## 2021-08-23 DIAGNOSIS — H43393 Other vitreous opacities, bilateral: Secondary | ICD-10-CM | POA: Diagnosis not present

## 2021-08-28 ENCOUNTER — Encounter: Payer: Self-pay | Admitting: Gastroenterology

## 2021-08-28 ENCOUNTER — Encounter: Payer: Self-pay | Admitting: Family Medicine

## 2021-08-30 ENCOUNTER — Ambulatory Visit
Admission: RE | Admit: 2021-08-30 | Discharge: 2021-08-30 | Disposition: A | Discharge status: Home / Self Care | Payer: Medicare Other | Source: Ambulatory Visit | Attending: Family Medicine | Admitting: Family Medicine

## 2021-08-30 DIAGNOSIS — N6310 Unspecified lump in the right breast, unspecified quadrant: Secondary | ICD-10-CM

## 2021-08-30 DIAGNOSIS — R922 Inconclusive mammogram: Secondary | ICD-10-CM | POA: Diagnosis not present

## 2021-09-22 DIAGNOSIS — R0981 Nasal congestion: Secondary | ICD-10-CM | POA: Diagnosis not present

## 2021-09-22 DIAGNOSIS — J069 Acute upper respiratory infection, unspecified: Secondary | ICD-10-CM | POA: Diagnosis not present

## 2021-09-26 ENCOUNTER — Encounter: Payer: Self-pay | Admitting: Family Medicine

## 2021-09-26 ENCOUNTER — Other Ambulatory Visit: Payer: Self-pay

## 2021-09-26 DIAGNOSIS — M797 Fibromyalgia: Secondary | ICD-10-CM

## 2021-09-26 MED ORDER — GABAPENTIN 300 MG PO CAPS: ORAL_CAPSULE | ORAL | 3 refills | Status: DC

## 2021-09-26 MED ORDER — GABAPENTIN 300 MG PO CAPS: 300.0000 mg | ORAL_CAPSULE | Freq: Three times a day (TID) | ORAL | 0 refills | Status: DC

## 2021-09-27 ENCOUNTER — Ambulatory Visit: Payer: Medicare Other | Admitting: Family Medicine

## 2021-10-11 ENCOUNTER — Ambulatory Visit: Payer: Medicare Other | Admitting: Family Medicine

## 2021-10-12 ENCOUNTER — Ambulatory Visit (INDEPENDENT_AMBULATORY_CARE_PROVIDER_SITE_OTHER): Payer: Medicare Other | Admitting: Family Medicine

## 2021-10-12 ENCOUNTER — Other Ambulatory Visit: Payer: Self-pay

## 2021-10-12 ENCOUNTER — Encounter: Payer: Self-pay | Admitting: Family Medicine

## 2021-10-12 VITALS — BP 130/74 | HR 82 | Temp 98.0°F | Ht 64.0 in | Wt 168.6 lb

## 2021-10-12 DIAGNOSIS — D539 Nutritional anemia, unspecified: Secondary | ICD-10-CM

## 2021-10-12 LAB — CBC WITH DIFFERENTIAL/PLATELET
Basophils Absolute: 0 10*3/uL (ref 0.0–0.1)
Basophils Relative: 0.8 % (ref 0.0–3.0)
Eosinophils Absolute: 0.2 10*3/uL (ref 0.0–0.7)
Eosinophils Relative: 4.6 % (ref 0.0–5.0)
HCT: 32.6 % — ABNORMAL LOW (ref 36.0–46.0)
Hemoglobin: 10.2 g/dL — ABNORMAL LOW (ref 12.0–15.0)
Lymphocytes Relative: 44.4 % (ref 12.0–46.0)
Lymphs Abs: 2.2 10*3/uL (ref 0.7–4.0)
MCHC: 31.2 g/dL (ref 30.0–36.0)
MCV: 74.1 fl — ABNORMAL LOW (ref 78.0–100.0)
Monocytes Absolute: 0.4 10*3/uL (ref 0.1–1.0)
Monocytes Relative: 7.4 % (ref 3.0–12.0)
Neutro Abs: 2.1 10*3/uL (ref 1.4–7.7)
Neutrophils Relative %: 42.8 % — ABNORMAL LOW (ref 43.0–77.0)
Platelets: 348 10*3/uL (ref 150.0–400.0)
RBC: 4.4 Mil/uL (ref 3.87–5.11)
RDW: 16.7 % — ABNORMAL HIGH (ref 11.5–15.5)
WBC: 4.9 10*3/uL (ref 4.0–10.5)

## 2021-10-12 LAB — B12 AND FOLATE PANEL
Folate: 8.5 ng/mL (ref >5.9)
Vitamin B-12: 377 pg/mL (ref 211–911)

## 2021-10-12 NOTE — Progress Notes (Signed)
Subjective  CC:  Chief Complaint  Patient presents with   Fatigue    Pt had blood taken on 12/22 and her hemoglobin was low.     HPI: Margaret White is a 71 y.o. female who presents to the office today to address the problems listed above in the chief complaint. 71 year old female with mild anemia noted when she was trying to donate blood last month.  She reports that she donates intermittently.  She had donated a month prior and levels were borderline.  She reports she donated about 3 times in the last 6 months.  No history of anemia.  No blood loss.  Recent normal GI evaluation with colonoscopy.  Admits to new ice pica.  Mild fatigue but this is typical with her fibromyalgia.  No chest pain or palpitations.  No vaginal bleeding.  No melena.  Lab Results  Component Value Date   WBC 3.7 (L) 02/09/2021   HGB 13.6 02/09/2021   HCT 41.2 02/09/2021   MCV 83.7 02/09/2021   PLT 272.0 02/09/2021  Pt [facility]   Assessment  1. Anemia, macrocytic      Plan  Anemia: Likely iron deficient due to blood donations but will start work-up with blood work.  Counseling education given.  No further interventions at this time.  Will supplement iron if labs indicate iron deficiency.  We will follow-up in 6 to 8 weeks  Follow up: 6 to 8 weeks 02/16/2022  Orders Placed This Encounter  Procedures   CBC with Differential/Platelet   Iron, TIBC and Ferritin Panel   B12 and Folate Panel   No orders of the defined types were placed in this encounter.     I reviewed the patients updated PMH, FH, and SocHx.    Patient Active Problem List   Diagnosis Date Noted   Prediabetes 02/09/2021    Priority: High   Tubular adenoma of colon 08/19/2019    Priority: High   Statin intolerance 03/25/2018    Priority: High   GERD (gastroesophageal reflux disease) 10/14/2017    Priority: High   NASH (nonalcoholic steatohepatitis) 10/14/2017    Priority: High   OSA (obstructive sleep  apnea) 10/14/2017    Priority: High   Fibromyalgia 10/14/2017    Priority: High   Obesity (BMI 30-39.9) 10/14/2017    Priority: High   Mixed hyperlipidemia 10/14/2017    Priority: High   Essential hypertension 06/26/2017    Priority: High   Incontinence without sensory awareness 05/08/2018    Priority: Medium    Mixed incontinence 05/08/2018    Priority: Medium    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    Priority: Medium    Bilateral primary osteoarthritis of hip 11/05/2017    Priority: Medium    Spondylosis of lumbar region without myelopathy or radiculopathy 11/04/2017    Priority: Medium    IBS (irritable bowel syndrome) 10/14/2017    Priority: Medium    Recurrent major depressive disorder, in full remission ([place]) 06/26/2017    Priority: Medium    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    Priority: Low   Allergic rhinitis 10/14/2017    Priority: Low   History of cluster headache 10/14/2017    Priority: Low   Chronic maxillary sinusitis 10/14/2017    Priority: Low   Vitamin B12 deficiency 10/14/2017    Priority: Low   Vitamin D deficiency 10/14/2017    Priority: Low   Female cystocele 10/14/2017    Priority: Low  Primary osteoarthritis of both hands 07/10/2017    Priority: Low   Periodic limb movement sleep disorder 08/15/2021   Current Meds  Medication Sig   amLODipine (NORVASC) 5 MG tablet TAKE 1 TABLET DAILY (Patient taking differently: Take 5 mg by mouth daily.)   calcium carbonate (OS-CAL) 600 MG TABS tablet Take 600 mg by mouth daily with breakfast.   cetirizine (ZYRTEC) 10 MG tablet Take 10 mg by mouth daily.   DULoxetine (CYMBALTA) 60 MG capsule TAKE 1 CAPSULE BY MOUTH EVERY DAY   esomeprazole (NEXIUM) 40 MG capsule Take 1 capsule (40 mg total) by mouth 2 (two) times daily before a meal.   ezetimibe (ZETIA) 10 MG tablet Take 1 tablet (10 mg total) by mouth daily.   FLUoxetine (PROZAC) 10 MG tablet Take 1 tablet (10 mg total) by mouth daily.    fluticasone (FLONASE) 50 MCG/ACT nasal spray USE 1 SPRAY IN EACH NOSTRIL DAILY AS NEEDED FOR ALLERGIES   gabapentin (NEURONTIN) 300 MG capsule Take 1 capsule (300 mg total) by mouth 3 (three) times daily.   guaiFENesin-codeine 100-10 MG/5ML syrup Take 5 mLs by mouth every 6 (six) hours as needed for cough.   Polyvinyl Alcohol-Povidone PF (REFRESH) 1.4-0.6 % SOLN Place 1 drop into both eyes daily as needed (Dry eye).    Allergies: Patient is allergic to codeine and sulfa antibiotics. Family History: Patient family history includes Alcoholism in her paternal grandfather; CAD in her maternal grandfather; Cancer in her paternal grandmother; Congestive Heart Failure in her mother; Emphysema in her maternal grandmother and sister; Heart disease in her brother, father, maternal grandfather, and maternal grandmother; Hypertension in her mother; Multiple myeloma in her mother; Other in her brother; Prostate cancer in her father. Social History:  Patient  reports that she has never smoked. She has never used smokeless tobacco. She reports that she does not drink alcohol and does not use drugs.  Review of Systems: Constitutional: Negative for fever malaise or anorexia Cardiovascular: negative for chest pain Respiratory: negative for SOB or persistent cough Gastrointestinal: negative for abdominal pain  Objective  Vitals: BP 130/74    Pulse 82    Temp 98 F (36.7 C) (Temporal)    Ht 5' 4"  (1.626 m)    Wt 168 lb 9.6 oz (76.5 kg)    SpO2 98%    BMI 28.94 kg/m  General: no acute distress , A&Ox3 HEENT: PEERL, conjunctiva mildly pale, neck is supple Cardiovascular:  RRR without murmur or gallop.  Respiratory:  Good breath sounds bilaterally, CTAB with normal respiratory effort Skin:  Warm, no rashes    Commons side effects, risks, benefits, and alternatives for medications and treatment plan prescribed today were discussed, and the patient expressed understanding of the given instructions. Patient is  instructed to call or message via MyChart if he/she has any questions or concerns regarding our treatment plan. No barriers to understanding were identified. We discussed Red Flag symptoms and signs in detail. Patient expressed understanding regarding what to do in case of urgent or emergency type symptoms.  Medication list was reconciled, printed and provided to the patient in AVS. Patient instructions and summary information was reviewed with the patient as documented in the AVS. This note was prepared with assistance of Dragon voice recognition software. Occasional wrong-word or sound-a-like substitutions may have occurred due to the inherent limitations of voice recognition software  This visit occurred during the SARS-CoV-2 public health emergency.  Safety protocols were in place, including screening questions prior to the  visit, additional usage of staff PPE, and extensive cleaning of exam room while observing appropriate contact time as indicated for disinfecting solutions.

## 2021-10-12 NOTE — Patient Instructions (Addendum)
Please return in 6-8 weeks to recheck anemia. You can schedule your complete physical for May as well.   I will release your lab results to you on your MyChart account with further instructions. Please reply with any questions.    If you have any questions or concerns, please don't hesitate to send me a message via MyChart or call the office at [phone]. Thank you for visiting with [place] today! It's our pleasure caring for you.

## 2021-10-13 ENCOUNTER — Encounter: Payer: Self-pay | Admitting: Family Medicine

## 2021-10-13 ENCOUNTER — Other Ambulatory Visit: Payer: Self-pay

## 2021-10-13 DIAGNOSIS — D509 Iron deficiency anemia, unspecified: Secondary | ICD-10-CM

## 2021-10-13 LAB — IRON,TIBC AND FERRITIN PANEL
%SAT: 6 % (calc) — ABNORMAL LOW (ref 16–45)
Ferritin: 6 ng/mL — ABNORMAL LOW (ref 16–288)
Iron: 26 ug/dL — ABNORMAL LOW (ref 45–160)
TIBC: 412 mcg/dL (calc) (ref 250–450)

## 2021-10-13 MED ORDER — IRON POLYSACCH CMPLX-B12-FA 150-0.025-1 MG PO CAPS: 1.0000 | ORAL_CAPSULE | Freq: Two times a day (BID) | ORAL | 2 refills | Status: DC

## 2021-10-13 NOTE — Addendum Note (Signed)
Addended by: Billey Chang on: 10/13/2021 11:57 AM   Modules accepted: Orders

## 2021-10-19 ENCOUNTER — Telehealth: Payer: Self-pay

## 2021-10-19 NOTE — Telephone Encounter (Signed)
Please advise 

## 2021-10-19 NOTE — Telephone Encounter (Signed)
Patient called states pharmacy ([facility]) does not have the script for Iron in stock.  States he might be a month before it come in.    Would like to know if there is anything she can take in place of this script.  I have also advised patient to check with other pharmacies to see if they have this script in stock.

## 2021-10-19 NOTE — Telephone Encounter (Signed)
Spoke with patient, gave a verbal understanding

## 2021-10-19 NOTE — Telephone Encounter (Signed)
Patient would like to know if she could find [place]

## 2021-11-16 ENCOUNTER — Other Ambulatory Visit: Payer: Self-pay

## 2021-11-16 ENCOUNTER — Other Ambulatory Visit (INDEPENDENT_AMBULATORY_CARE_PROVIDER_SITE_OTHER): Payer: Medicare Other

## 2021-11-16 DIAGNOSIS — D509 Iron deficiency anemia, unspecified: Secondary | ICD-10-CM | POA: Diagnosis not present

## 2021-11-16 LAB — CBC WITH DIFFERENTIAL/PLATELET
Basophils Absolute: 0.1 10*3/uL (ref 0.0–0.1)
Basophils Relative: 1.6 % (ref 0.0–3.0)
Eosinophils Absolute: 0.3 10*3/uL (ref 0.0–0.7)
Eosinophils Relative: 6.1 % — ABNORMAL HIGH (ref 0.0–5.0)
HCT: 40.3 % (ref 36.0–46.0)
Hemoglobin: 12.6 g/dL (ref 12.0–15.0)
Lymphocytes Relative: 37.3 % (ref 12.0–46.0)
Lymphs Abs: 2.1 10*3/uL (ref 0.7–4.0)
MCHC: 31.1 g/dL (ref 30.0–36.0)
MCV: 78.3 fl (ref 78.0–100.0)
Monocytes Absolute: 0.5 10*3/uL (ref 0.1–1.0)
Monocytes Relative: 9.2 % (ref 3.0–12.0)
Neutro Abs: 2.6 10*3/uL (ref 1.4–7.7)
Neutrophils Relative %: 45.8 % (ref 43.0–77.0)
Platelets: 336 10*3/uL (ref 150.0–400.0)
RBC: 5.15 Mil/uL — ABNORMAL HIGH (ref 3.87–5.11)
RDW: 24.8 % — ABNORMAL HIGH (ref 11.5–15.5)
WBC: 5.6 10*3/uL (ref 4.0–10.5)

## 2021-11-16 LAB — IRON,TIBC AND FERRITIN PANEL
%SAT: 15 % (calc) — ABNORMAL LOW (ref 16–45)
Ferritin: 53 ng/mL (ref 16–288)
Iron: 53 ug/dL (ref 45–160)
TIBC: 355 mcg/dL (calc) (ref 250–450)

## 2021-11-17 ENCOUNTER — Encounter: Payer: Self-pay | Admitting: Family Medicine

## 2021-11-17 ENCOUNTER — Other Ambulatory Visit: Payer: Medicare Other

## 2021-11-17 NOTE — Progress Notes (Signed)
Spoke with patient, Gave verbalized understanding.

## 2021-11-17 NOTE — Progress Notes (Signed)
Please call patient: I have reviewed his/her lab results. Anemia has resolved and iron stores are now in the low normal range. Please continue iron once daily for now and daily if she will restart blood donations. I recommend holding off for 3 more mnths. thanks

## 2021-12-28 ENCOUNTER — Other Ambulatory Visit: Payer: Self-pay | Admitting: Family Medicine

## 2021-12-28 ENCOUNTER — Other Ambulatory Visit: Payer: Self-pay

## 2021-12-28 DIAGNOSIS — E782 Mixed hyperlipidemia: Secondary | ICD-10-CM

## 2021-12-28 DIAGNOSIS — I10 Essential (primary) hypertension: Secondary | ICD-10-CM

## 2021-12-28 MED ORDER — EZETIMIBE 10 MG PO TABS: 10.0000 mg | ORAL_TABLET | Freq: Every day | ORAL | 3 refills | Status: DC

## 2022-01-19 IMAGING — US US BREAST*R* LIMITED INC AXILLA
1 series · 4 of 4 positions shown · non-contrast
Comparison: Previous exam(s).

CLINICAL DATA: Two year follow-up of a right breast mass.

EXAM:
DIGITAL DIAGNOSTIC BILATERAL MAMMOGRAM WITH TOMOSYNTHESIS AND CAD;
ULTRASOUND RIGHT BREAST LIMITED
TECHNIQUE: Bilateral digital diagnostic mammography and breast tomosynthesis
was performed. The images were evaluated with computer-aided
detection.; Targeted ultrasound examination of the right breast was
performed

[Series 1: us breast*right* limited inc axilla · 0.06mm/px · 4 of 4 slices shown]
[im 1/4]
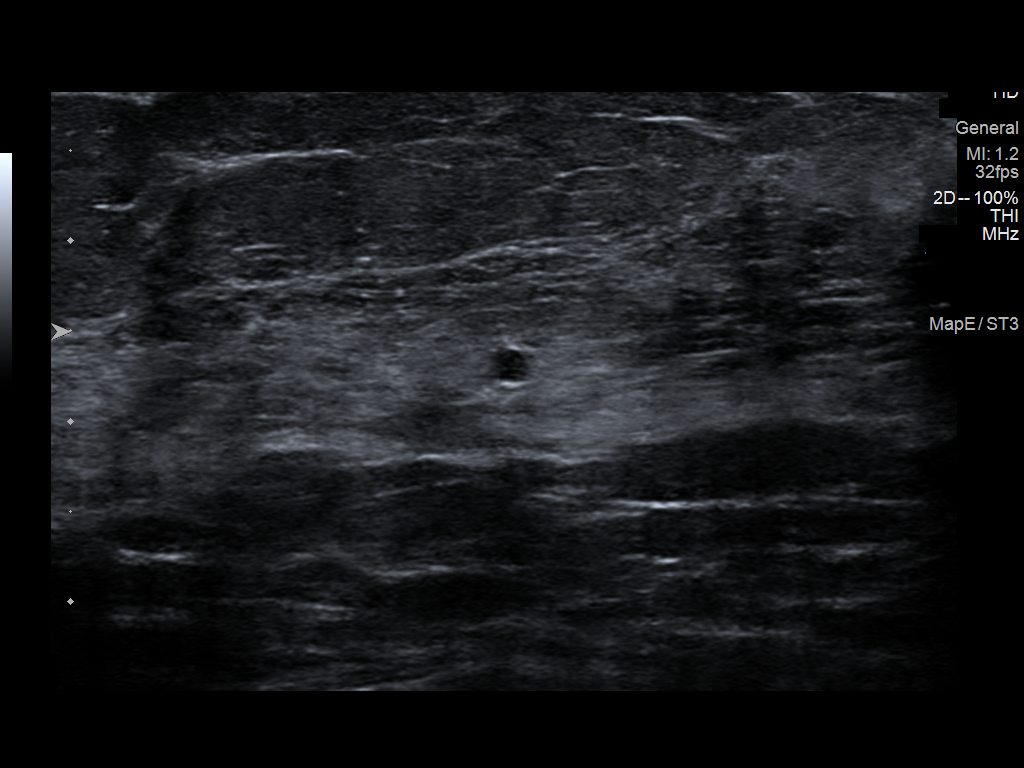
[im 2/4]
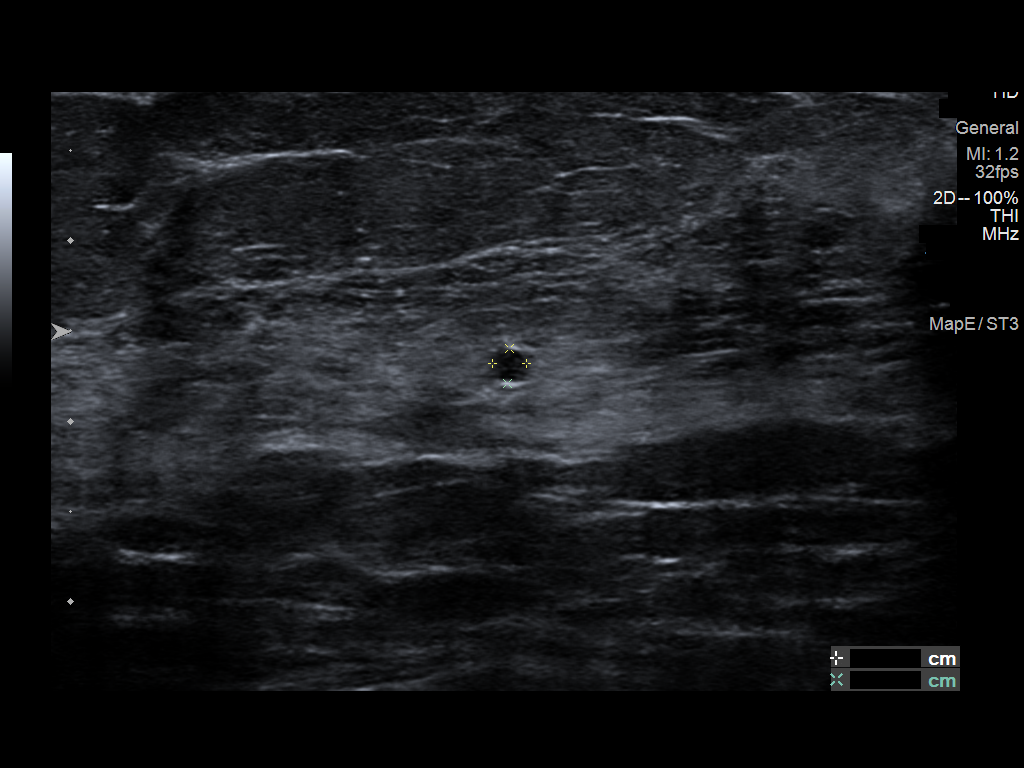
[im 3/4]
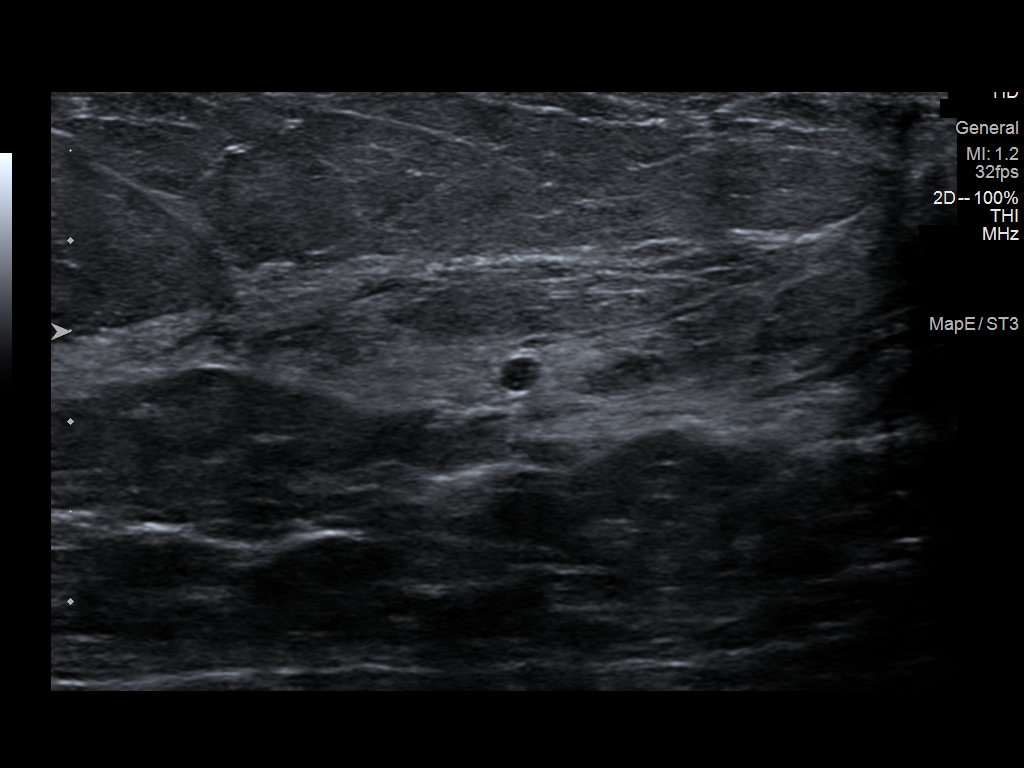
[im 4/4]
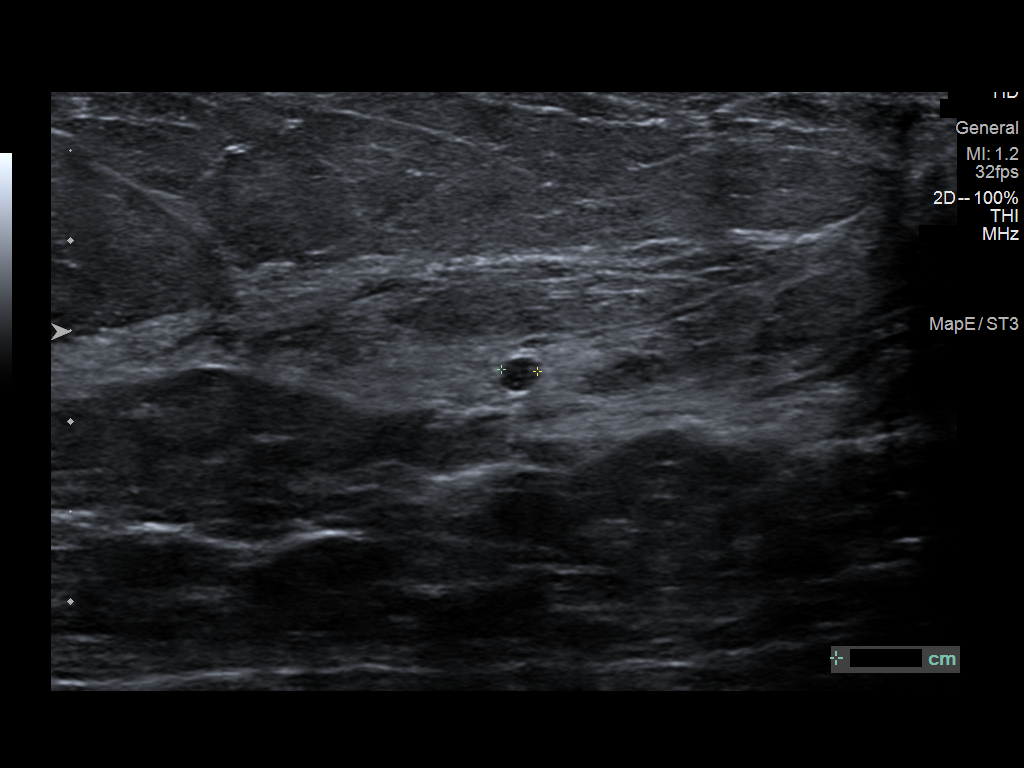

[4 of 4 positions shown; findings below may reference images not displayed]

ACR Breast Density Category b: There are scattered areas of
fibroglandular density.
FINDINGS: The previously identified right breast mass has resolved. No
suspicious masses, calcifications, or distortion identified in
either breast.

On physical exam, no suspicious lumps are identified.

Targeted ultrasound is performed, showing a tiny probable
complicated cyst in the right breast at 12 o'clock, 1 cm from the
nipple measuring 2 x 2 x 2 mm today versus 4 x 4 by 6 mm in August 2019.
IMPRESSION: The previously identified probably benign right breast mass is
dramatically smaller over the last 2 years consistent with a small
benign mass, likely a complicated cyst. No other suspicious findings
are seen in either breast.

RECOMMENDATION:
Annual screening mammography.

I have discussed the findings and recommendations with the patient.
If applicable, a reminder letter will be sent to the patient
regarding the next appointment.

BI-RADS CATEGORY  2: Benign.

## 2022-01-19 IMAGING — MG DIGITAL DIAGNOSTIC BILAT W/ TOMO W/ CAD
8 series · 8 of 24 positions shown · non-contrast
Comparison: Previous exam(s).

CLINICAL DATA: Two year follow-up of a right breast mass.

EXAM:
DIGITAL DIAGNOSTIC BILATERAL MAMMOGRAM WITH TOMOSYNTHESIS AND CAD;
ULTRASOUND RIGHT BREAST LIMITED
TECHNIQUE: Bilateral digital diagnostic mammography and breast tomosynthesis
was performed. The images were evaluated with computer-aided
detection.; Targeted ultrasound examination of the right breast was
performed

[L CC synth-2D]
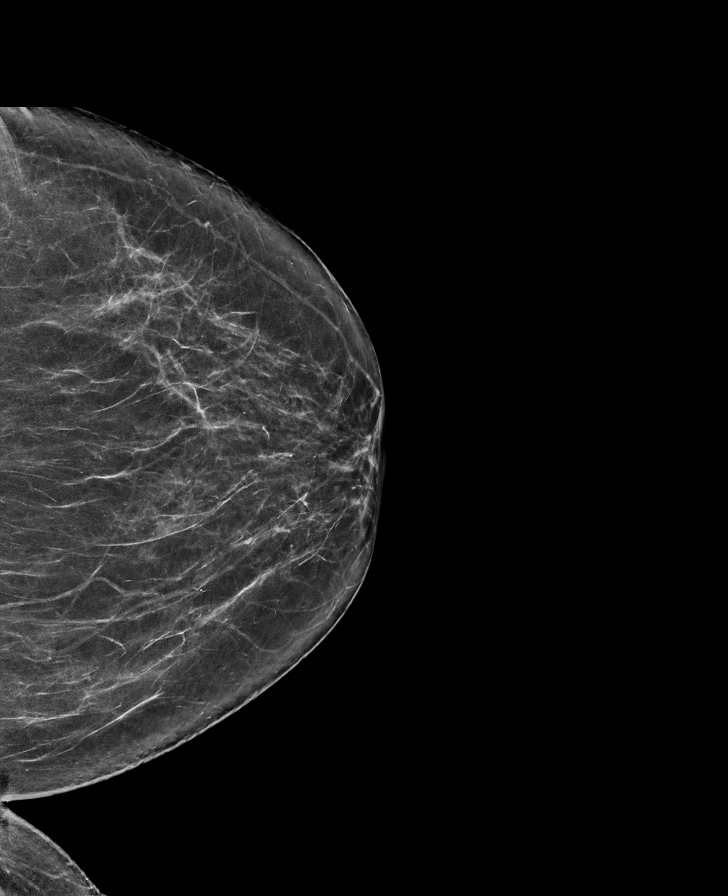

[L MLO synth-2D]
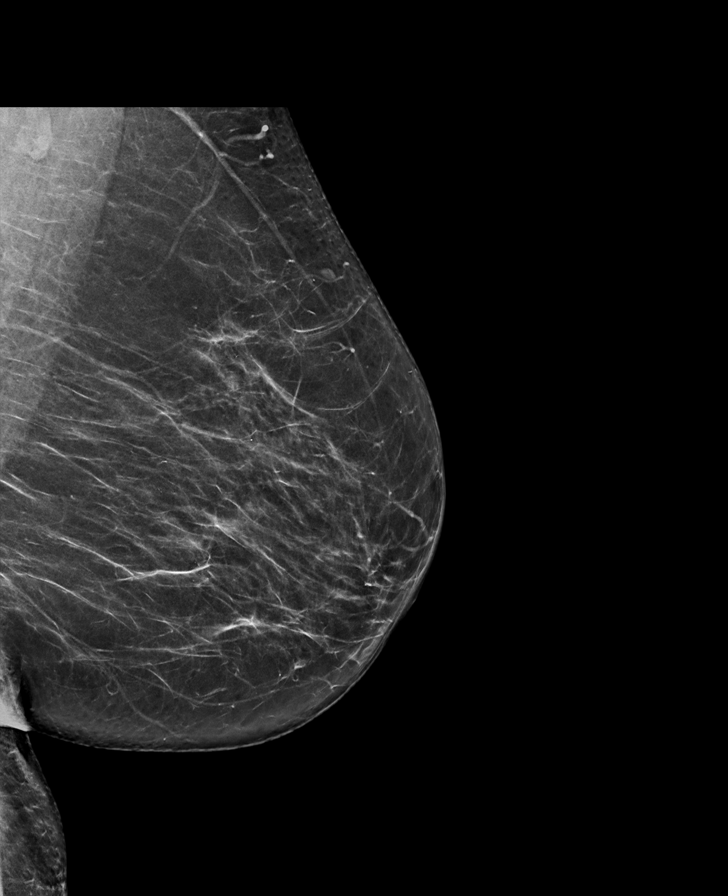

[R MLO synth-2D]
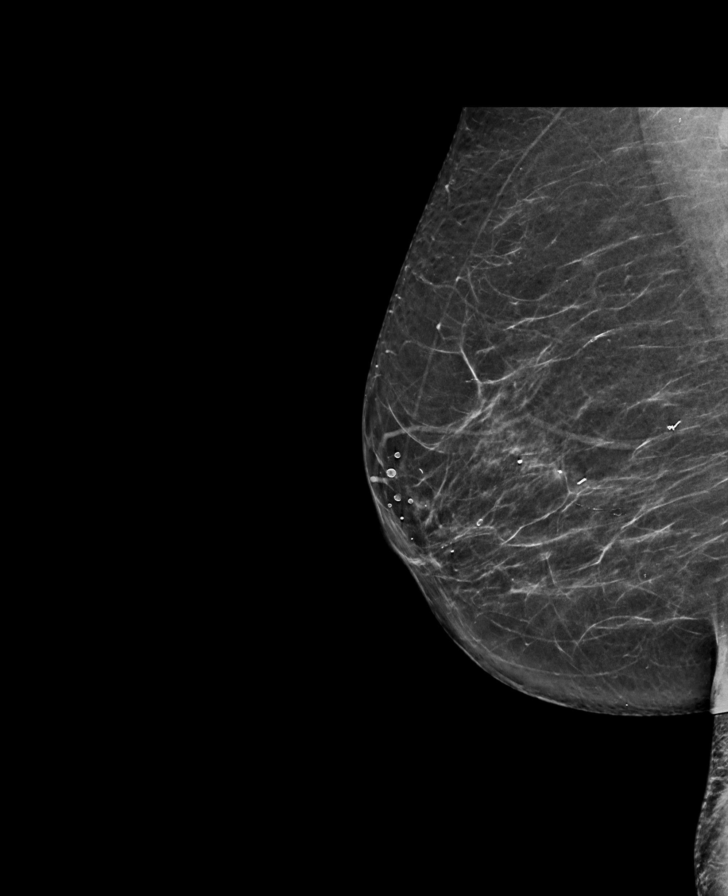

[R CC synth-2D]
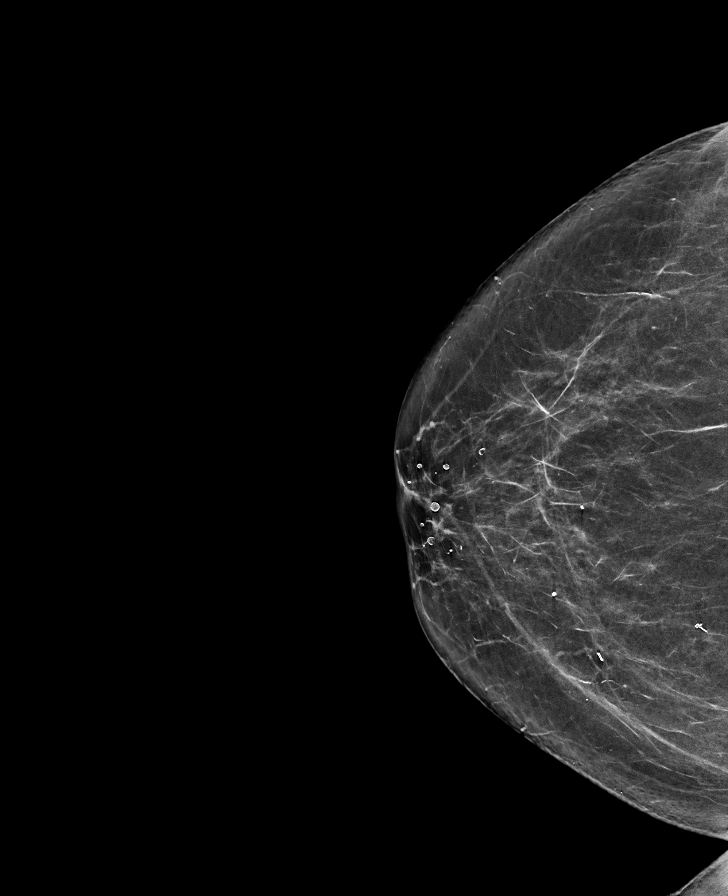

[R CC tomo · tomo slice 35/69.0]
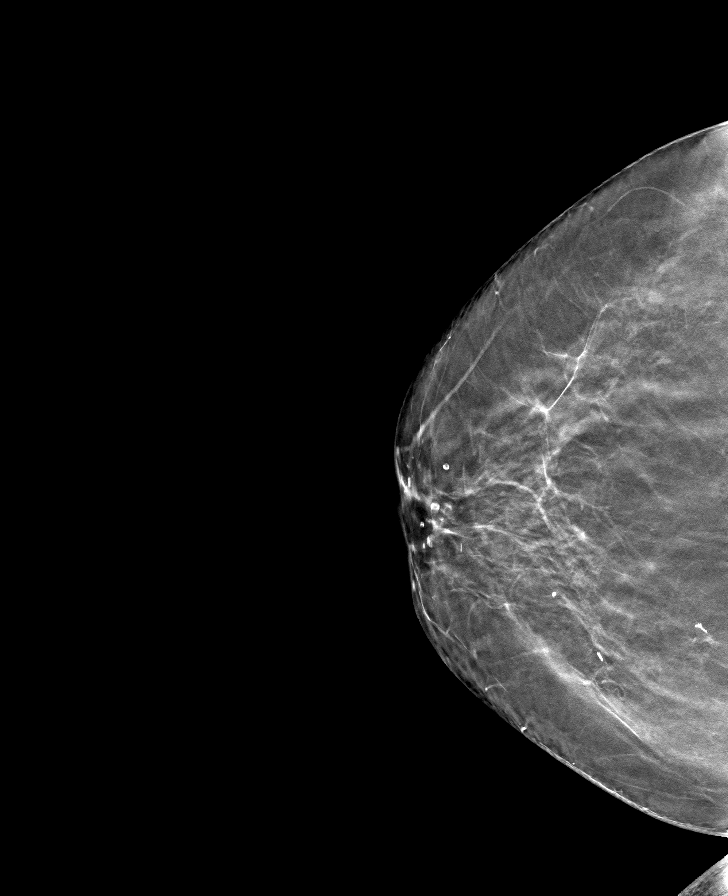

[L MLO tomo · tomo slice 38/75.0]
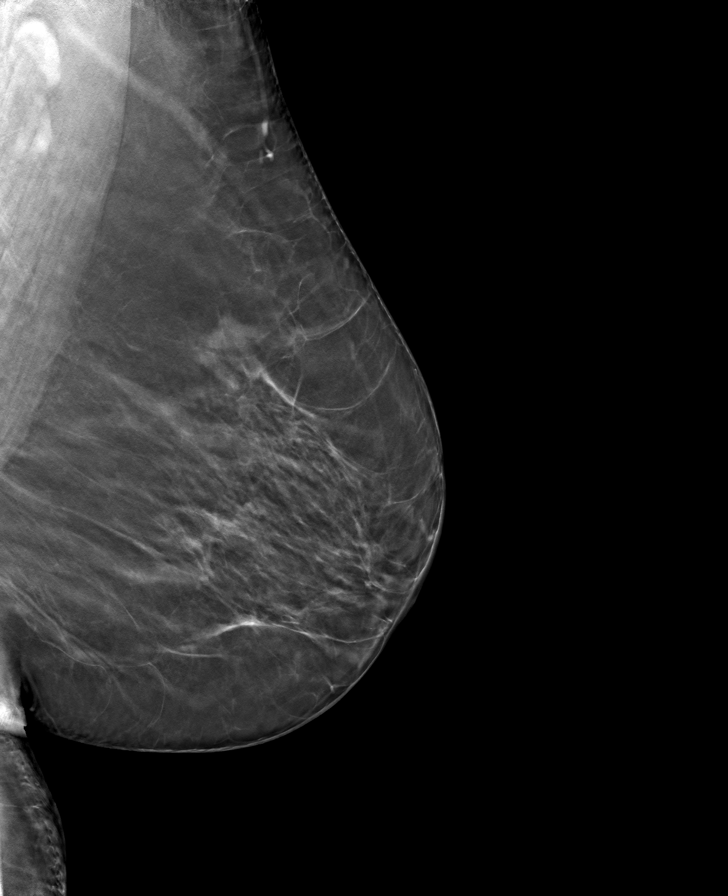

[R MLO tomo · tomo slice 36/71.0]
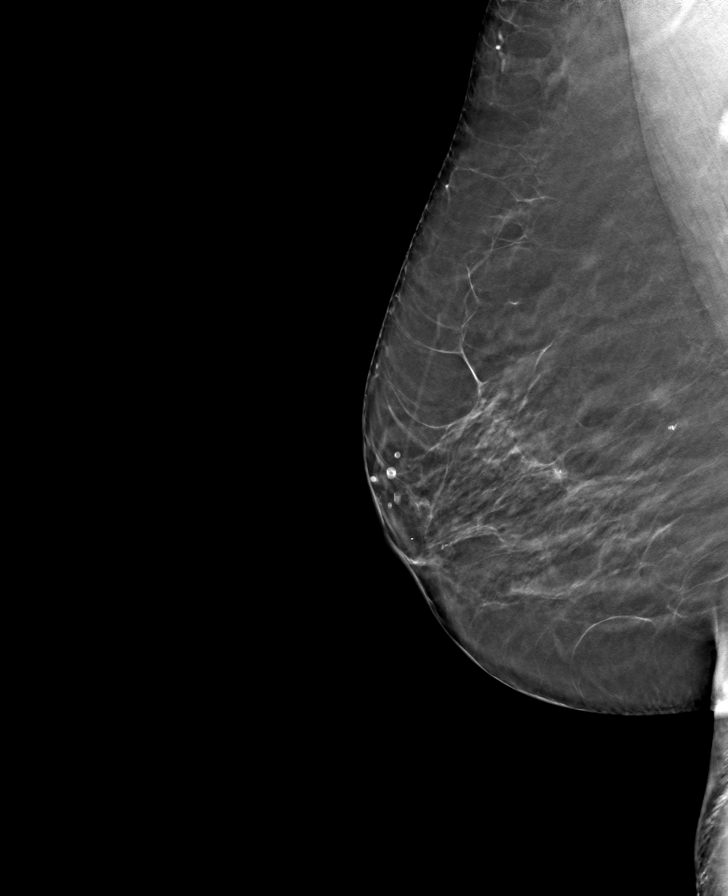

[L CC tomo · tomo slice 36/71.0]
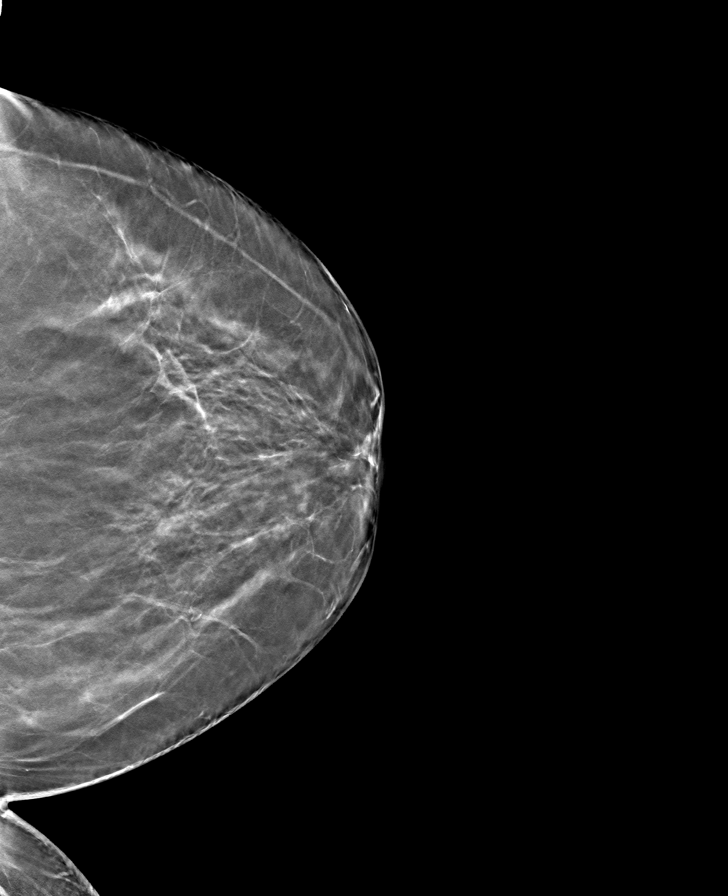

[8 of 24 positions shown; findings below may reference images not displayed]

ACR Breast Density Category b: There are scattered areas of
fibroglandular density.
FINDINGS: The previously identified right breast mass has resolved. No
suspicious masses, calcifications, or distortion identified in
either breast.

On physical exam, no suspicious lumps are identified.

Targeted ultrasound is performed, showing a tiny probable
complicated cyst in the right breast at 12 o'clock, 1 cm from the
nipple measuring 2 x 2 x 2 mm today versus 4 x 4 by 6 mm in August 2019.
IMPRESSION: The previously identified probably benign right breast mass is
dramatically smaller over the last 2 years consistent with a small
benign mass, likely a complicated cyst. No other suspicious findings
are seen in either breast.

RECOMMENDATION:
Annual screening mammography.

I have discussed the findings and recommendations with the patient.
If applicable, a reminder letter will be sent to the patient
regarding the next appointment.

BI-RADS CATEGORY  2: Benign.

## 2022-01-24 DIAGNOSIS — Z20822 Contact with and (suspected) exposure to covid-19: Secondary | ICD-10-CM | POA: Diagnosis not present

## 2022-02-12 ENCOUNTER — Other Ambulatory Visit: Payer: Self-pay | Admitting: Family Medicine

## 2022-02-12 DIAGNOSIS — F3342 Major depressive disorder, recurrent, in full remission: Secondary | ICD-10-CM

## 2022-02-12 DIAGNOSIS — Z20822 Contact with and (suspected) exposure to covid-19: Secondary | ICD-10-CM | POA: Diagnosis not present

## 2022-02-13 DIAGNOSIS — L301 Dyshidrosis [pompholyx]: Secondary | ICD-10-CM | POA: Diagnosis not present

## 2022-02-13 DIAGNOSIS — D485 Neoplasm of uncertain behavior of skin: Secondary | ICD-10-CM | POA: Diagnosis not present

## 2022-02-13 DIAGNOSIS — L821 Other seborrheic keratosis: Secondary | ICD-10-CM | POA: Diagnosis not present

## 2022-02-16 ENCOUNTER — Ambulatory Visit (INDEPENDENT_AMBULATORY_CARE_PROVIDER_SITE_OTHER): Payer: Medicare Other

## 2022-02-16 VITALS — BP 118/80 | HR 80 | Temp 98.6°F | Wt 158.0 lb

## 2022-02-16 DIAGNOSIS — Z0000 Encounter for general adult medical examination without abnormal findings: Secondary | ICD-10-CM

## 2022-02-16 NOTE — Progress Notes (Signed)
? ?Subjective:  ? Margaret White is a 71 y.o. female who presents for Medicare Annual (Subsequent) preventive examination. ? ?Review of Systems    ? ?Cardiac Risk Factors include: advanced age (>56mn, >>59women);dyslipidemia;hypertension ? ?   ?Objective:  ?  ?Today's Vitals  ? 02/16/22 0840  ?BP: 118/80  ?Pulse: 80  ?Temp: 98.6 ?F (37 ?C)  ?SpO2: 92%  ?Weight: 158 lb (71.7 kg)  ? ?Body mass index is 27.12 kg/m?. ? ? ?  02/16/2022  ?  8:48 AM 02/09/2021  ?  8:40 AM 10/27/2020  ?  9:25 AM 09/26/2020  ?  3:45 PM 01/05/2020  ?  1:02 PM 03/24/2019  ?  9:19 AM 03/02/2019  ? 12:11 PM  ?Advanced Directives  ?Does Patient Have a Medical Advance Directive? Yes _0  No  ?Type of AScientist, forensicPower of Attorney        ?Copy of [place]? No - copy requested        ?Would patient like information on creating a medical advance directive?  No - Patient declined No - Patient declined No - Patient declined Yes (MAU/Ambulatory/Procedural Areas - Information given) No - Patient declined   ? ? ?Current Medications (verified) ?Outpatient Encounter Medications as of 02/16/2022  ?Medication Sig  ? amLODipine (NORVASC) 5 MG tablet TAKE 1 TABLET DAILY  ? calcium carbonate (OS-CAL) 600 MG TABS tablet Take 600 mg by mouth daily with breakfast.  ? cetirizine (ZYRTEC) 10 MG tablet Take 10 mg by mouth daily.  ? DULoxetine (CYMBALTA) 60 MG capsule TAKE 1 CAPSULE BY MOUTH EVERY DAY  ? esomeprazole (NEXIUM) 40 MG capsule Take 1 capsule (40 mg total) by mouth 2 (two) times daily before a meal.  ? ezetimibe (ZETIA) 10 MG tablet Take 1 tablet (10 mg total) by mouth daily.  ? FLUoxetine (PROZAC) 10 MG tablet TAKE 1 TABLET DAILY  ? fluticasone (FLONASE) 50 MCG/ACT nasal spray USE 1 SPRAY IN EACH NOSTRIL DAILY AS NEEDED FOR ALLERGIES  ? gabapentin (NEURONTIN) 300 MG capsule Take 1 capsule (300 mg total) by mouth 3 (three) times daily.  ? Iron Polysacch Cmplx-B12-FA 150-0.025-1 MG CAPS Take 1 capsule by  mouth 2 (two) times daily.  ? Polyvinyl Alcohol-Povidone PF (REFRESH) 1.4-0.6 % SOLN Place 1 drop into both eyes daily as needed (Dry eye).  ? [DISCONTINUED] guaiFENesin-codeine 100-10 MG/5ML syrup Take 5 mLs by mouth every 6 (six) hours as needed for cough.  ? ?No facility-administered encounter medications on file as of 02/16/2022.  ? ? ?Allergies (verified) ?Codeine and Sulfa antibiotics  ? ?History: ?Past Medical History:  ?Diagnosis Date  ? Allergic rhinitis   ? Allergy   ? Anxiety   ? Asthma   ? Avascular necrosis of hip, right ([place] 03/24/2018  ? Cataract   ? Chronic cluster headache   ? Chronic sinusitis   ? Diabetes mellitus type 2, diet-controlled ([place]   ? per pt currently no taking metformin, followed by pcp  ? Eczema   ? Essential hypertension   ? cardiologsit-- dr dr mBettina Gavia ? Fatty liver   ? Fibromyalgia   ? GERD (gastroesophageal reflux disease)   ? Hepatic steatosis 10/14/2017  ? Hiatal hernia   ? 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  ? Bilateral right worse than left that is mild in nature X-rays obtained in [place] on 07/12/2017 that are available through the [facility] PACS system  ? History of avascular necrosis of capital femoral epiphysis   ?  s/p  right THA  ? History of colon polyps   ? History of hyperthyroidism 2005  ? History of pneumonia, recurrent   ? History of seizure 2006  ? x1  ? IBS (irritable bowel syndrome)   ? IBS (irritable bowel syndrome)   ? Mixed hyperlipidemia   ? Nodule of lower lobe of left lung 10/2018  ? 48m;   pulmologist-- dr iMeda Coffee bLeory Plowman stable per lov note in epic  ? Ocular rosacea   ? OSA (obstructive sleep apnea)   ? per pt intolerant to cpap due to sinus issues,  uses mouth guard   ? 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 [facility]   ? Pneumonia   ? Primary osteoarthritis of both hands 07/10/2017  ? PTSD (post-traumatic stress disorder)   ? Recurrent major depressive disorder, in full remission ([place]  06/26/2017  ? RLS (restless legs syndrome)   ? Seasonal allergies   ? Seizure ([place] 2006  ? x 1  ? Sleep apnea   ? Solitary pulmonary nodule   ? pulmologist-- dr iMeda Coffee bLeory Plowman, lov note in epic,  stable  ? Spondylosis of lumbar region without myelopathy or radiculopathy 11/04/2017  ? MRI 11/01/2017: Severe L4-5 facet arthrosis on the right greater than left. Shallow disc bulging without significant neuroforaminal stenosis.  ? SUI (stress urinary incontinence, female)   ? Vitamin B 12 deficiency   ? Vitamin D deficiency   ? ?Past Surgical History:  ?Procedure Laterality Date  ? ABDOMINOPLASTY  2006  ? ANAL FISSURE REPAIR  2006  ? BIOPSY  10/27/2020  ? Procedure: BIOPSY;  Surgeon: MIrving Copas, MD;  [place]  Service: Gastroenterology;;  ? [place] ? BREAST BIOPSY Right   ?  benign    ? CARPAL TUNNEL RELEASE Bilateral 1989  ? CATARACT EXTRACTION W/ INTRAOCULAR LENS  IMPLANT, BILATERAL  2017  ? COLONOSCOPY    ? COLONOSCOPY W/ POLYPECTOMY  2017  ? EE[place] ? ESOPHAGOGASTRODUODENOSCOPY (EGD) WITH PROPOFOL N/A 10/27/2020  ? Procedure: ESOPHAGOGASTRODUODENOSCOPY (EGD) WITH PROPOFOL;  Surgeon: MRush LandmarkGTelford Nab, MD;  [place]  Service: Gastroenterology;  Laterality: N/A;  ? EYE SURGERY  child   ? x4  1957-1960  ? INCONTINENCE SURGERY    ? NASAL SEPTUM SURGERY  1987  ? PUBOVAGINAL SLING N/A 03/24/2019  ? Procedure: CYSTOSCOPY  PUBO-VAGINAL SLING;  Surgeon: MBjorn Loser MD;  [facility]  Service: Urology;  Laterality: N/A;  ? REVISION TOTAL HIP ARTHROPLASTY Right   ? RHINOPLASTY  2007  ? TOTAL ABDOMINAL HYSTERECTOMY W/ BILATERAL SALPINGOOPHORECTOMY  1984  ? w/ Marshall-Marchetti (bladder tack suspension)  ? TOTAL HIP ARTHROPLASTY Right 04/11/2018  ? Procedure: RIGHT TOTAL HIP ARTHROPLASTY ANTERIOR APPROACH;  Surgeon: BMcarthur Rossetti MD;  [facility];  Service: Orthopedics;  Laterality: Right;  ?  ULNAR NERVE TRANSPOSITION Left 1990s  ? UPPER ESOPHAGEAL ENDOSCOPIC ULTRASOUND (EUS) N/A 10/27/2020  ? Procedure: UPPER ESOPHAGEAL ENDOSCOPIC ULTRASOUND (EUS);  Surgeon: MIrving Copas, MD;  [place]  Service: Gastroenterology;  Laterality: N/A;  ? ?Family History  ?Problem Relation Age of Onset  ? Hypertension Mother   ? Congestive Heart Failure Mother   ? Multiple myeloma Mother   ? Heart disease Father   ? Prostate cancer Father   ? Emphysema Sister   ? Heart disease Brother   ? Other Brother   ?  brain tumor  ? Heart disease Maternal Grandmother   ? Emphysema Maternal Grandmother   ? CAD Maternal Grandfather   ? Heart disease Maternal Grandfather   ? Cancer Paternal Grandmother   ?     type unknown, mets  ? Alcoholism Paternal Grandfather   ? Colon cancer Neg Hx   ? Esophageal cancer Neg Hx   ? Stomach cancer Neg Hx   ? Rectal cancer Neg Hx   ? ?Social History  ? ?Socioeconomic History  ? Marital status: Married  ?  Spouse name: Not on file  ? Number of children: 4  ? Years of education: Not on file  ? Highest education level: Not on file  ?Occupational History  ? Occupation: TEACHER  ?  Employer: [employer]  ?  Comment: retired  ?Tobacco Use  ? Smoking status: Never  ? Smokeless tobacco: Never  ?Vaping Use  ? Vaping Use: Never used  ?Substance and Sexual Activity  ? Alcohol use: No  ? Drug use: Never  ? Sexual activity: Yes  ?  Birth control/protection: Surgical  ?Other Topics Concern  ? Not on file  ?Social History Narrative  ? Not on file  ? ?Social Determinants of Health  ? ?Financial Resource Strain: Low Risk   ? Difficulty of Paying Living Expenses: Not hard at all  ?Food Insecurity: No Food Insecurity  ? Worried About Charity fundraiser in the Last Year: Never true  ? Ran Out of Food in the Last Year: Never true  ?Transportation Needs: No Transportation Needs  ? Lack of Transportation (Medical): No  ? Lack of Transportation (Non-Medical): No  ?Physical Activity:  Sufficiently Active  ? Days of Exercise per Week: 5 days  ? Minutes of Exercise per Session: 90 min  ?Stress: No Stress Concern Present  ? Feeling of Stress : Not at all  ?Social Connections: Socially Integrated  ? Fre

## 2022-02-16 NOTE — Patient Instructions (Signed)
Ms. Fake , ?Thank you for taking time to come for your Medicare Wellness Visit. I appreciate your ongoing commitment to your health goals. Please review the following plan we discussed and let me know if I can assist you in the future.  ? ?Screening recommendations/referrals: ?Colonoscopy: Done 08/14/21 repeat every 10 years  ?Mammogram: Done 08/30/21 repeat every year  ?Bone Density: Done 10/25/17 repeat every 2 years  ?Recommended yearly ophthalmology/optometry visit for glaucoma screening and checkup ?Recommended yearly dental visit for hygiene and checkup ? ?Vaccinations: ?Influenza vaccine: Done 08/07/21 repeat every year  ?Pneumococcal vaccine: Up to date ?Tdap vaccine: Done 04/25/18 repeat every 10 years  ?Shingles vaccine: Completed 08/07/21 & 08/30/21   ?Covid-19:Declined  ? ?Advanced directives: Please bring a copy of your health care power of attorney and living will to the office at your convenience. ? ?Conditions/risks identified: Lose 15- 20 lbs more  ? ?Next appointment: Follow up in one year for your annual wellness visit  ? ? ?Preventive Care 70 Years and Older, Female ?Preventive care refers to lifestyle choices and visits with your health care provider that can promote health and wellness. ?What does preventive care include? ?A yearly physical exam. This is also called an annual well check. ?Dental exams once or twice a year. ?Routine eye exams. Ask your health care provider how often you should have your eyes checked. ?Personal lifestyle choices, including: ?Daily care of your teeth and gums. ?Regular physical activity. ?Eating a healthy diet. ?Avoiding tobacco and drug use. ?Limiting alcohol use. ?Practicing safe sex. ?Taking low-dose aspirin every day. ?Taking vitamin and mineral supplements as recommended by your health care provider. ?What happens during an annual well check? ?The services and screenings done by your health care provider during your annual well check will depend on your age,  overall health, lifestyle risk factors, and family history of disease. ?Counseling  ?Your health care provider may ask you questions about your: ?Alcohol use. ?Tobacco use. ?Drug use. ?Emotional well-being. ?Home and relationship well-being. ?Sexual activity. ?Eating habits. ?History of falls. ?Memory and ability to understand (cognition). ?Work and work Statistician. ?Reproductive health. ?Screening  ?You may have the following tests or measurements: ?Height, weight, and BMI. ?Blood pressure. ?Lipid and cholesterol levels. These may be checked every 5 years, or more frequently if you are over 70 years old. ?Skin check. ?Lung cancer screening. You may have this screening every year starting at age 70 if you have a 30-pack-year history of smoking and currently smoke or have quit within the past 15 years. ?Fecal occult blood test (FOBT) of the stool. You may have this test every year starting at age 70. ?Flexible sigmoidoscopy or colonoscopy. You may have a sigmoidoscopy every 5 years or a colonoscopy every 10 years starting at age 70. ?Hepatitis C blood test. ?Hepatitis B blood test. ?Sexually transmitted disease (STD) testing. ?Diabetes screening. This is done by checking your blood sugar (glucose) after you have not eaten for a while (fasting). You may have this done every 1-3 years. ?Bone density scan. This is done to screen for osteoporosis. You may have this done starting at age 70. ?Mammogram. This may be done every 1-2 years. Talk to your health care provider about how often you should have regular mammograms. ?Talk with your health care provider about your test results, treatment options, and if necessary, the need for more tests. ?Vaccines  ?Your health care provider may recommend certain vaccines, such as: ?Influenza vaccine. This is recommended every year. ?Tetanus, diphtheria, and  acellular pertussis (Tdap, Td) vaccine. You may need a Td booster every 10 years. ?Zoster vaccine. You may need this after age  70. ?Pneumococcal 13-valent conjugate (PCV13) vaccine. One dose is recommended after age 39. ?Pneumococcal polysaccharide (PPSV23) vaccine. One dose is recommended after age 41. ?Talk to your health care provider about which screenings and vaccines you need and how often you need them. ?This information is not intended to replace advice given to you by your health care provider. Make sure you discuss any questions you have with your health care provider. ?Document Released: 10/21/2015 Document Revised: 06/13/2016 Document Reviewed: 07/26/2015 ?Elsevier Interactive Patient Education ? 2017 [employer]. ? ?Fall Prevention in the Home ?Falls can cause injuries. They can happen to people of all ages. There are many things you can do to make your home safe and to help prevent falls. ?What can I do on the outside of my home? ?Regularly fix the edges of walkways and driveways and fix any cracks. ?Remove anything that might make you trip as you walk through a door, such as a raised step or threshold. ?Trim any bushes or trees on the path to your home. ?Use bright outdoor lighting. ?Clear any walking paths of anything that might make someone trip, such as rocks or tools. ?Regularly check to see if handrails are loose or broken. Make sure that both sides of any steps have handrails. ?Any raised decks and porches should have guardrails on the edges. ?Have any leaves, snow, or ice cleared regularly. ?Use sand or salt on walking paths during winter. ?Clean up any spills in your garage right away. This includes oil or grease spills. ?What can I do in the bathroom? ?Use night lights. ?Install grab bars by the toilet and in the tub and shower. Do not use towel bars as grab bars. ?Use non-skid mats or decals in the tub or shower. ?If you need to sit down in the shower, use a plastic, non-slip stool. ?Keep the floor dry. Clean up any water that spills on the floor as soon as it happens. ?Remove soap buildup in the tub or shower  regularly. ?Attach bath mats securely with double-sided non-slip rug tape. ?Do not have throw rugs and other things on the floor that can make you trip. ?What can I do in the bedroom? ?Use night lights. ?Make sure that you have a light by your bed that is easy to reach. ?Do not use any sheets or blankets that are too big for your bed. They should not hang down onto the floor. ?Have a firm chair that has side arms. You can use this for support while you get dressed. ?Do not have throw rugs and other things on the floor that can make you trip. ?What can I do in the kitchen? ?Clean up any spills right away. ?Avoid walking on wet floors. ?Keep items that you use a lot in easy-to-reach places. ?If you need to reach something above you, use a strong step stool that has a grab bar. ?Keep electrical cords out of the way. ?Do not use floor polish or wax that makes floors slippery. If you must use wax, use non-skid floor wax. ?Do not have throw rugs and other things on the floor that can make you trip. ?What can I do with my stairs? ?Do not leave any items on the stairs. ?Make sure that there are handrails on both sides of the stairs and use them. Fix handrails that are broken or loose. Make sure that  handrails are as long as the stairways. ?Check any carpeting to make sure that it is firmly attached to the stairs. Fix any carpet that is loose or worn. ?Avoid having throw rugs at the top or bottom of the stairs. If you do have throw rugs, attach them to the floor with carpet tape. ?Make sure that you have a light switch at the top of the stairs and the bottom of the stairs. If you do not have them, ask someone to add them for you. ?What else can I do to help prevent falls? ?Wear shoes that: ?Do not have high heels. ?Have rubber bottoms. ?Are comfortable and fit you well. ?Are closed at the toe. Do not wear sandals. ?If you use a stepladder: ?Make sure that it is fully opened. Do not climb a closed stepladder. ?Make sure that  both sides of the stepladder are locked into place. ?Ask someone to hold it for you, if possible. ?Clearly mark and make sure that you can see: ?Any grab bars or handrails. ?First and last steps. ?Where t

## 2022-02-27 ENCOUNTER — Encounter: Payer: Self-pay | Admitting: Family Medicine

## 2022-02-27 ENCOUNTER — Ambulatory Visit (INDEPENDENT_AMBULATORY_CARE_PROVIDER_SITE_OTHER): Payer: Medicare Other | Admitting: Family Medicine

## 2022-02-27 VITALS — BP 118/78 | HR 64 | Temp 97.4°F | Ht 64.0 in | Wt 157.6 lb

## 2022-02-27 DIAGNOSIS — M797 Fibromyalgia: Secondary | ICD-10-CM

## 2022-02-27 DIAGNOSIS — K7581 Nonalcoholic steatohepatitis (NASH): Secondary | ICD-10-CM | POA: Diagnosis not present

## 2022-02-27 DIAGNOSIS — D500 Iron deficiency anemia secondary to blood loss (chronic): Secondary | ICD-10-CM | POA: Diagnosis not present

## 2022-02-27 DIAGNOSIS — D126 Benign neoplasm of colon, unspecified: Secondary | ICD-10-CM

## 2022-02-27 DIAGNOSIS — F3342 Major depressive disorder, recurrent, in full remission: Secondary | ICD-10-CM

## 2022-02-27 DIAGNOSIS — R7303 Prediabetes: Secondary | ICD-10-CM

## 2022-02-27 DIAGNOSIS — E782 Mixed hyperlipidemia: Secondary | ICD-10-CM

## 2022-02-27 DIAGNOSIS — E538 Deficiency of other specified B group vitamins: Secondary | ICD-10-CM

## 2022-02-27 DIAGNOSIS — I10 Essential (primary) hypertension: Secondary | ICD-10-CM | POA: Diagnosis not present

## 2022-02-27 DIAGNOSIS — K219 Gastro-esophageal reflux disease without esophagitis: Secondary | ICD-10-CM

## 2022-02-27 LAB — CBC WITH DIFFERENTIAL/PLATELET
Basophils Absolute: 0 10*3/uL (ref 0.0–0.1)
Basophils Relative: 0.6 % (ref 0.0–3.0)
Eosinophils Absolute: 0.1 10*3/uL (ref 0.0–0.7)
Eosinophils Relative: 2.1 % (ref 0.0–5.0)
HCT: 42.9 % (ref 36.0–46.0)
Hemoglobin: 14.1 g/dL (ref 12.0–15.0)
Lymphocytes Relative: 33.5 % (ref 12.0–46.0)
Lymphs Abs: 1.6 10*3/uL (ref 0.7–4.0)
MCHC: 32.8 g/dL (ref 30.0–36.0)
MCV: 86.7 fl (ref 78.0–100.0)
Monocytes Absolute: 0.4 10*3/uL (ref 0.1–1.0)
Monocytes Relative: 7.7 % (ref 3.0–12.0)
Neutro Abs: 2.7 10*3/uL (ref 1.4–7.7)
Neutrophils Relative %: 56.1 % (ref 43.0–77.0)
Platelets: 259 10*3/uL (ref 150.0–400.0)
RBC: 4.94 Mil/uL (ref 3.87–5.11)
RDW: 14.7 % (ref 11.5–15.5)
WBC: 4.9 10*3/uL (ref 4.0–10.5)

## 2022-02-27 LAB — COMPREHENSIVE METABOLIC PANEL
ALT: 27 U/L (ref 0–35)
AST: 25 U/L (ref 0–37)
Albumin: 4.4 g/dL (ref 3.5–5.2)
Alkaline Phosphatase: 85 U/L (ref 39–117)
BUN: 20 mg/dL (ref 6–23)
CO2: 31 mEq/L (ref 19–32)
Calcium: 10 mg/dL (ref 8.4–10.5)
Chloride: 103 mEq/L (ref 96–112)
Creatinine, Ser: 0.99 mg/dL (ref 0.40–1.20)
GFR: 58.19 mL/min — ABNORMAL LOW (ref >60.00)
Glucose, Bld: 102 mg/dL — ABNORMAL HIGH (ref 70–99)
Potassium: 4.3 mEq/L (ref 3.5–5.1)
Sodium: 144 mEq/L (ref 135–145)
Total Bilirubin: 0.6 mg/dL (ref 0.2–1.2)
Total Protein: 7.3 g/dL (ref 6.0–8.3)

## 2022-02-27 LAB — HEMOGLOBIN A1C: Hgb A1c MFr Bld: 6.4 % (ref 4.6–6.5)

## 2022-02-27 LAB — LDL CHOLESTEROL, DIRECT: Direct LDL: 113 mg/dL

## 2022-02-27 LAB — LIPID PANEL
Cholesterol: 206 mg/dL — ABNORMAL HIGH (ref 0–200)
HDL: 49 mg/dL (ref >39.00)
NonHDL: 157.03
Total CHOL/HDL Ratio: 4
Triglycerides: 243 mg/dL — ABNORMAL HIGH (ref 0.0–149.0)
VLDL: 48.6 mg/dL — ABNORMAL HIGH (ref 0.0–40.0)

## 2022-02-27 LAB — VITAMIN B12: Vitamin B-12: 794 pg/mL (ref 211–911)

## 2022-02-27 LAB — TSH: TSH: 2.66 u[IU]/mL (ref 0.35–5.50)

## 2022-02-27 NOTE — Progress Notes (Signed)
Subjective  CC:  Chief Complaint  Patient presents with   Annual Exam    Pt here for Annual exam and is currently fasting. Pt also has some concerns with HR when she was exercising went up to 185 and then dropped back down.     HPI: Margaret White is a 71 y.o. female who presents to the office today to address the problems listed above in the chief complaint. HM: screens are up to date. Imms are up to date. Lifestyle changes: eating whole foods and exercising regularly Hypertension f/u: Control is good . Pt reports she is doing well. taking medications as instructed, no medication side effects noted, no TIAs, no chest pain on exertion, no dyspnea on exertion, no swelling of ankles. She denies adverse effects from his BP medications. Compliance with medication is good.  We reviewed her chronic problems listed below; pain and sleep are improved with lifestyle changes. Tolerating zetia for hld and fasting for recheck. Depression is controlled on cymbalta. Remains on iron daily and hasn't donated blood since January. Anemia should be resolved with normal iron stores due for recheck. Recheck b12 as well. [place]: low fat diet.    Assessment  1. Essential hypertension   2. Gastroesophageal reflux disease without esophagitis   3. NASH (nonalcoholic steatohepatitis)   4. Tubular adenoma of colon   5. Mixed hyperlipidemia   6. Fibromyalgia   7. Recurrent major depressive disorder, in full remission ([place])   8. Vitamin B12 deficiency   9. Iron deficiency anemia due to chronic blood loss   10. Prediabetes      Plan   Hypertension f/u: BP control is well controlled. Continue meds. Check lytes and renal function.  As above, check lipids, thyroid, iron and cbc. Continue mood meds. Continue healthy diet and exercise.  Mood is controlled.  Suspect prediabetes has resolved. Recheck today.   Education regarding management of these chronic disease states was given. Management strategies discussed  on successive visits include dietary and exercise recommendations, goals of achieving and maintaining IBW, and lifestyle modifications aiming for adequate sleep and minimizing stressors.   Follow up: 6 mo for recheck  Orders Placed This Encounter  Procedures   CBC with Differential/Platelet   Comprehensive metabolic panel   Iron, TIBC and Ferritin Panel   Hemoglobin A1c   Lipid panel   TSH   Vitamin B12   No orders of the defined types were placed in this encounter.     BP Readings from Last 3 Encounters:  02/27/22 118/78  02/16/22 118/80  10/12/21 130/74   Wt Readings from Last 3 Encounters:  02/27/22 157 lb 9.6 oz (71.5 kg)  02/16/22 158 lb (71.7 kg)  10/12/21 168 lb 9.6 oz (76.5 kg)    Lab Results  Component Value Date   CHOL 210 (H) 08/10/2021   CHOL 208 (H) 02/09/2021   CHOL 190 02/08/2020   Lab Results  Component Value Date   HDL 48.30 02/09/2021   HDL 49.30 02/08/2020   HDL 55.10 01/22/2019   Lab Results  Component Value Date   LDLCALC 104 (H) 02/08/2020   LDLCALC 108 (H) 07/30/2018   LDLCALC 155 (H) 10/14/2017   Lab Results  Component Value Date   TRIG 170 (H) 08/10/2021   TRIG 211.0 (H) 02/09/2021   TRIG 185.0 (H) 02/08/2020   Lab Results  Component Value Date   CHOLHDL 4 02/09/2021   CHOLHDL 4 02/08/2020   CHOLHDL 4 01/22/2019   Lab Results  Component Value Date   LDLDIRECT 111.0 02/09/2021   LDLDIRECT 125.0 01/22/2019   Lab Results  Component Value Date   CREATININE 0.98 02/09/2021   BUN 16 02/09/2021   NA 142 02/09/2021   K 4.3 02/09/2021   CL 105 02/09/2021   CO2 28 02/09/2021    The 10-year ASCVD risk score (Arnett DK, et al., 2019) is: 10.2%   Values used to calculate the score:     Age: 70 years     Sex: Female     Is Non-Hispanic African American: No     Diabetic: No     Tobacco smoker: No     Systolic Blood Pressure: 672 mmHg     Is BP treated: Yes     HDL Cholesterol: 48.3 mg/dL     Total Cholesterol: 210  mg/dL  I reviewed the patients updated PMH, FH, and SocHx.    Patient Active Problem List   Diagnosis Date Noted   Prediabetes 02/09/2021    Priority: High   Tubular adenoma of colon 08/19/2019    Priority: High   Statin intolerance 03/25/2018    Priority: High   GERD (gastroesophageal reflux disease) 10/14/2017    Priority: High   NASH (nonalcoholic steatohepatitis) 10/14/2017    Priority: High   OSA (obstructive sleep apnea) 10/14/2017    Priority: High   Fibromyalgia 10/14/2017    Priority: High   Obesity (BMI 30-39.9) 10/14/2017    Priority: High   Mixed hyperlipidemia 10/14/2017    Priority: High   Essential hypertension 06/26/2017    Priority: High   Incontinence without sensory awareness 05/08/2018    Priority: Medium    Mixed incontinence 05/08/2018    Priority: Medium    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    Priority: Medium    Bilateral primary osteoarthritis of hip 11/05/2017    Priority: Medium    Spondylosis of lumbar region without myelopathy or radiculopathy 11/04/2017    Priority: Medium    IBS (irritable bowel syndrome) 10/14/2017    Priority: Medium    Recurrent major depressive disorder, in full remission ([place]) 06/26/2017    Priority: Medium    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    Priority: Low   Allergic rhinitis 10/14/2017    Priority: Low   History of cluster headache 10/14/2017    Priority: Low   Chronic maxillary sinusitis 10/14/2017    Priority: Low   Vitamin B12 deficiency 10/14/2017    Priority: Low   Vitamin D deficiency 10/14/2017    Priority: Low   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 She has never used smokeless tobacco. She reports that she does not drink alcohol and does not use  drugs.  Current Meds  Medication Sig   amLODipine (NORVASC) 5 MG tablet TAKE 1 TABLET DAILY   calcium carbonate (OS-CAL) 600 MG TABS tablet Take 600 mg by mouth daily with breakfast.   cetirizine (ZYRTEC) 10 MG tablet Take 10 mg by mouth daily.   DULoxetine (CYMBALTA) 60 MG capsule TAKE 1 CAPSULE BY MOUTH EVERY DAY   esomeprazole (NEXIUM) 40 MG capsule Take 1 capsule (40 mg total) by mouth 2 (two) times daily before a meal.   ezetimibe (ZETIA) 10 MG tablet Take 1 tablet (  10 mg total) by mouth daily.   FLUoxetine (PROZAC) 10 MG tablet TAKE 1 TABLET DAILY   fluticasone (FLONASE) 50 MCG/ACT nasal spray USE 1 SPRAY IN EACH NOSTRIL DAILY AS NEEDED FOR ALLERGIES   gabapentin (NEURONTIN) 300 MG capsule Take 1 capsule (300 mg total) by mouth 3 (three) times daily.   Iron Polysacch Cmplx-B12-FA 150-0.025-1 MG CAPS Take 1 capsule by mouth 2 (two) times daily.   Polyvinyl Alcohol-Povidone PF (REFRESH) 1.4-0.6 % SOLN Place 1 drop into both eyes daily as needed (Dry eye).    Review of Systems: Cardiovascular: negative for chest pain, palpitations, leg swelling, orthopnea Respiratory: negative for SOB, wheezing or persistent cough Gastrointestinal: negative for abdominal pain Genitourinary: negative for dysuria or gross hematuria  Objective  Vitals: BP 118/78   Pulse 64   Temp (!) 97.4 F (36.3 C)   Ht 5' 4"  (1.626 m)   Wt 157 lb 9.6 oz (71.5 kg)   SpO2 96%   BMI 27.05 kg/m  General: no acute distress  Psych:  Alert and oriented, normal mood and affect HEENT:  Normocephalic, atraumatic, supple neck  Cardiovascular:  RRR without murmur. no edema Respiratory:  Good breath sounds bilaterally, CTAB with normal respiratory effort Skin:  Warm, no rashes Neurologic:   Mental status is normal Commons side effects, risks, benefits, and alternatives for medications and treatment plan prescribed today were discussed, and the patient expressed understanding of the given instructions. Patient is  instructed to call or message via MyChart if he/she has any questions or concerns regarding our treatment plan. No barriers to understanding were identified. We discussed Red Flag symptoms and signs in detail. Patient expressed understanding regarding what to do in case of urgent or emergency type symptoms.  Medication list was reconciled, printed and provided to the patient in AVS. Patient instructions and summary information was reviewed with the patient as documented in the AVS. This note was prepared with assistance of Dragon voice recognition software. Occasional wrong-word or sound-a-like substitutions may have occurred due to the inherent limitations of voice recognition software  This visit occurred during the SARS-CoV-2 public health emergency.  Safety protocols were in place, including screening questions prior to the visit, additional usage of staff PPE, and extensive cleaning of exam room while observing appropriate contact time as indicated for disinfecting solutions.

## 2022-02-27 NOTE — Patient Instructions (Signed)
Please return in 6 months for hypertension follow up.   I will release your lab results to you on your MyChart account with further instructions. You may see the results before I do, but when I review them I will send you a message with my report or have my assistant call you if things need to be discussed. Please reply to my message with any questions. Thank you!   If you have any questions or concerns, please don't hesitate to send me a message via MyChart or call the office at [phone]. Thank you for visiting with [place] today! It's our pleasure caring for you.

## 2022-02-28 LAB — IRON,TIBC AND FERRITIN PANEL
%SAT: 36 % (calc) (ref 16–45)
Ferritin: 36 ng/mL (ref 16–288)
Iron: 113 ug/dL (ref 45–160)
TIBC: 316 mcg/dL (calc) (ref 250–450)

## 2022-03-22 ENCOUNTER — Other Ambulatory Visit: Payer: Self-pay | Admitting: Family Medicine

## 2022-03-22 DIAGNOSIS — J320 Chronic maxillary sinusitis: Secondary | ICD-10-CM

## 2022-04-07 DIAGNOSIS — R070 Pain in throat: Secondary | ICD-10-CM | POA: Diagnosis not present

## 2022-04-07 DIAGNOSIS — J0100 Acute maxillary sinusitis, unspecified: Secondary | ICD-10-CM | POA: Diagnosis not present

## 2022-04-07 DIAGNOSIS — R0981 Nasal congestion: Secondary | ICD-10-CM | POA: Diagnosis not present

## 2022-04-07 DIAGNOSIS — M791 Myalgia, unspecified site: Secondary | ICD-10-CM | POA: Diagnosis not present

## 2022-04-09 ENCOUNTER — Ambulatory Visit (INDEPENDENT_AMBULATORY_CARE_PROVIDER_SITE_OTHER): Payer: Medicare Other | Admitting: Physician Assistant

## 2022-04-09 ENCOUNTER — Other Ambulatory Visit: Payer: Self-pay

## 2022-04-09 ENCOUNTER — Encounter (HOSPITAL_BASED_OUTPATIENT_CLINIC_OR_DEPARTMENT_OTHER): Payer: Self-pay

## 2022-04-09 ENCOUNTER — Emergency Department (HOSPITAL_BASED_OUTPATIENT_CLINIC_OR_DEPARTMENT_OTHER)
Admission: EM | Admit: 2022-04-09 | Discharge: 2022-04-09 | Disposition: A | Discharge status: Home / Self Care | Payer: Medicare Other | Attending: Emergency Medicine | Admitting: Emergency Medicine

## 2022-04-09 VITALS — BP 116/78 | HR 87 | Temp 98.3°F | Resp 18 | Ht 64.0 in | Wt 155.6 lb

## 2022-04-09 DIAGNOSIS — M791 Myalgia, unspecified site: Secondary | ICD-10-CM | POA: Diagnosis not present

## 2022-04-09 DIAGNOSIS — Z20822 Contact with and (suspected) exposure to covid-19: Secondary | ICD-10-CM | POA: Insufficient documentation

## 2022-04-09 DIAGNOSIS — M542 Cervicalgia: Secondary | ICD-10-CM | POA: Diagnosis not present

## 2022-04-09 DIAGNOSIS — R5381 Other malaise: Secondary | ICD-10-CM

## 2022-04-09 DIAGNOSIS — R5383 Other fatigue: Secondary | ICD-10-CM

## 2022-04-09 LAB — COMPREHENSIVE METABOLIC PANEL
ALT: 30 U/L (ref 0–44)
AST: 22 U/L (ref 15–41)
Albumin: 4.6 g/dL (ref 3.5–5.0)
Alkaline Phosphatase: 79 U/L (ref 38–126)
Anion gap: 13 (ref 5–15)
BUN: 14 mg/dL (ref 8–23)
CO2: 26 mmol/L (ref 22–32)
Calcium: 9.8 mg/dL (ref 8.9–10.3)
Chloride: 100 mmol/L (ref 98–111)
Creatinine, Ser: 0.99 mg/dL (ref 0.44–1.00)
GFR, Estimated: >60 mL/min (ref >60)
Glucose, Bld: 103 mg/dL — ABNORMAL HIGH (ref 70–99)
Potassium: 3.9 mmol/L (ref 3.5–5.1)
Sodium: 139 mmol/L (ref 135–145)
Total Bilirubin: 0.6 mg/dL (ref 0.3–1.2)
Total Protein: 8 g/dL (ref 6.5–8.1)

## 2022-04-09 LAB — RESP PANEL BY RT-PCR (FLU A&B, COVID) ARPGX2
Influenza A by PCR: NEGATIVE
Influenza B by PCR: NEGATIVE
SARS Coronavirus 2 by RT PCR: NEGATIVE

## 2022-04-09 LAB — CBC WITH DIFFERENTIAL/PLATELET
Abs Immature Granulocytes: 0.01 10*3/uL (ref 0.00–0.07)
Basophils Absolute: 0 10*3/uL (ref 0.0–0.1)
Basophils Relative: 1 %
Eosinophils Absolute: 0.2 10*3/uL (ref 0.0–0.5)
Eosinophils Relative: 4 %
HCT: 43.5 % (ref 36.0–46.0)
Hemoglobin: 13.9 g/dL (ref 12.0–15.0)
Immature Granulocytes: 0 %
Lymphocytes Relative: 26 %
Lymphs Abs: 1.4 10*3/uL (ref 0.7–4.0)
MCH: 28.2 pg (ref 26.0–34.0)
MCHC: 32 g/dL (ref 30.0–36.0)
MCV: 88.2 fL (ref 80.0–100.0)
Monocytes Absolute: 0.8 10*3/uL (ref 0.1–1.0)
Monocytes Relative: 14 %
Neutro Abs: 3 10*3/uL (ref 1.7–7.7)
Neutrophils Relative %: 55 %
Platelets: 214 10*3/uL (ref 150–400)
RBC: 4.93 MIL/uL (ref 3.87–5.11)
RDW: 15.6 % — ABNORMAL HIGH (ref 11.5–15.5)
WBC: 5.4 10*3/uL (ref 4.0–10.5)
nRBC: 0 % (ref 0.0–0.2)

## 2022-04-09 LAB — POC COVID19 BINAXNOW: SARS Coronavirus 2 Ag: NEGATIVE

## 2022-04-09 MED ORDER — DICLOFENAC SODIUM 1 % EX GEL: 4.0000 g | Freq: Four times a day (QID) | CUTANEOUS | 0 refills | Status: DC

## 2022-04-09 MED ORDER — DIPHENHYDRAMINE HCL 25 MG PO CAPS
25.0000 mg | ORAL_CAPSULE | Freq: Once | ORAL | Status: AC
  Administered 2022-04-09: 25 mg via ORAL
  Filled 2022-04-09: qty 1

## 2022-04-09 MED ORDER — ACETAMINOPHEN 500 MG PO TABS
1000.0000 mg | ORAL_TABLET | Freq: Once | ORAL | Status: AC
  Administered 2022-04-09: 1000 mg via ORAL
  Filled 2022-04-09: qty 2

## 2022-04-09 MED ORDER — METOCLOPRAMIDE HCL 5 MG/ML IJ SOLN
10.0000 mg | Freq: Once | INTRAMUSCULAR | Status: AC
  Administered 2022-04-09: 10 mg via INTRAVENOUS
  Filled 2022-04-09: qty 2

## 2022-04-09 MED ORDER — LIDOCAINE 5 % EX PTCH: 1.0000 | MEDICATED_PATCH | CUTANEOUS | 0 refills | Status: DC

## 2022-04-09 NOTE — ED Provider Notes (Signed)
[facility] EMERGENCY DEPT Provider Note   CSN: [id number] Arrival date & time: 04/09/22  1232     History  Chief Complaint  Patient presents with   Generalized Body Aches    Margaret White is a 71 y.o. female.  HPI Patient is a 71 year old female presented emergency room today with complaints of body aches chills some diffuse myalgias including back and neck aches and some fatigue.  She denies any confusion.  She denies any nausea vomiting chest pain or difficulty breathing.  She denies any sore throat diarrhea cough lightheadedness or dizziness.  She states that she feels like her neck is sore and achy because of muscle pain.  She states she has no other symptoms.  Has taken no medications today    Home Medications Prior to Admission medications   Medication Sig Start Date End Date Taking? Authorizing Provider  diclofenac Sodium (VOLTAREN) 1 % GEL Apply 4 g topically 4 (four) times daily. 04/09/22  Yes Vernestine Brodhead S, PA  lidocaine (LIDODERM) 5 % Place 1 patch onto the skin daily. Remove & Discard patch within 12 hours or as directed by MD 04/09/22  Yes Lavone Orn, Kathleene Hazel, PA  amLODipine (NORVASC) 5 MG tablet TAKE 1 TABLET DAILY 01/01/22   Leamon Arnt, MD  azithromycin (ZITHROMAX) 250 MG tablet Take 250 mg by mouth daily. Take two tablets on day one, then one daily x 4 days.    [provider]  calcium carbonate (OS-CAL) 600 MG TABS tablet Take 600 mg by mouth daily with breakfast.    [provider]  cetirizine (ZYRTEC) 10 MG tablet Take 10 mg by mouth daily.    [provider]  DULoxetine (CYMBALTA) 60 MG capsule TAKE 1 CAPSULE BY MOUTH EVERY DAY 06/28/21   Leamon Arnt, MD  esomeprazole (NEXIUM) 40 MG capsule Take 1 capsule (40 mg total) by mouth 2 (two) times daily before a meal. 08/10/21   Thornton Park, MD  ezetimibe (ZETIA) 10 MG tablet Take 1 tablet (10 mg total) by mouth daily. 12/28/21   Leamon Arnt, MD  FLUoxetine  (PROZAC) 10 MG tablet TAKE 1 TABLET DAILY 02/12/22   Leamon Arnt, MD  fluticasone [facility]) 50 MCG/ACT nasal spray USE 1 SPRAY IN EACH NOSTRIL DAILY AS NEEDED FOR ALLERGIES 03/22/22   Leamon Arnt, MD  gabapentin (NEURONTIN) 300 MG capsule Take 1 capsule (300 mg total) by mouth 3 (three) times daily. 09/26/21   Leamon Arnt, MD  Iron Polysacch Cmplx-B12-FA 150-0.025-1 MG CAPS Take 1 capsule by mouth 2 (two) times daily. 10/13/21   Leamon Arnt, MD  Polyvinyl Alcohol-Povidone PF (REFRESH) 1.4-0.6 % SOLN Place 1 drop into both eyes daily as needed (Dry eye).    [provider]      Allergies    Codeine and Sulfa antibiotics    Review of Systems   Review of Systems  Physical Exam Updated Vital Signs BP 104/73   Pulse 83   Temp 98.5 F (36.9 C) (Oral)   Resp 16   Ht 5' 4"  (1.626 m)   Wt 70.6 kg   SpO2 94%   BMI 26.72 kg/m  Physical Exam Vitals and nursing note reviewed.  Constitutional:      General: She is not in acute distress. HENT:     Head: Normocephalic and atraumatic.     Nose: Nose normal.  Eyes:     General: No scleral icterus. Neck:     Comments: Tenderness palpation  of the paracervical musculature.  No midline tenderness.  Able to nod her head yes but does seem to have some decreased range of motion with looking left and right endorses discomfort with turning her head left Cardiovascular:     Rate and Rhythm: Normal rate and regular rhythm.     Pulses: Normal pulses.     Heart sounds: Normal heart sounds.  Pulmonary:     Effort: Pulmonary effort is normal. No respiratory distress.     Breath sounds: No wheezing.  Abdominal:     Palpations: Abdomen is soft.     Tenderness: There is no abdominal tenderness.  Musculoskeletal:     Cervical back: Normal range of motion.     Right lower leg: No edema.     Left lower leg: No edema.  Skin:    General: Skin is warm and dry.     Capillary Refill: Capillary refill takes less than 2 seconds.   Neurological:     Mental Status: She is alert. Mental status is at baseline.     Comments: Alert and oriented to self, place, time and event.   Speech is fluent, clear without dysarthria or dysphasia.   Strength 5/5 in upper/lower extremities   Sensation intact in upper/lower extremities   CN I not tested  CN II grossly intact visual fields bilaterally. Did not visualize posterior eye.  CN III, IV, VI PERRLA and EOMs intact bilaterally  CN V Intact sensation to sharp and light touch to the face  CN VII facial movements symmetric  CN VIII not tested  CN IX, X no uvula deviation, symmetric rise of soft palate  CN XI 5/5 SCM and trapezius strength bilaterally  CN XII Midline tongue protrusion, symmetric L/R movements   Psychiatric:        Mood and Affect: Mood normal.        Behavior: Behavior normal.    ED Results / Procedures / Treatments   Labs (all labs ordered are listed, but only abnormal results are displayed) Labs Reviewed  COMPREHENSIVE METABOLIC PANEL - Abnormal; Notable for the following components:      Result Value   Glucose, Bld 103 (*)    All other components within normal limits  CBC WITH DIFFERENTIAL/PLATELET - Abnormal; Notable for the following components:   RDW 15.6 (*)    All other components within normal limits  RESP PANEL BY RT-PCR (FLU A&B, COVID) [id number]    EKG None  Radiology No results found.  Procedures Procedures    Medications Ordered in ED Medications  acetaminophen (TYLENOL) tablet 1,000 mg (1,000 mg Oral Given 04/09/22 1612)  metoCLOPramide (REGLAN) injection 10 mg (10 mg Intravenous Given 04/09/22 1612)  diphenhydrAMINE (BENADRYL) capsule 25 mg (25 mg Oral Given 04/09/22 1612)    ED Course/ Medical Decision Making/ A&P Clinical Course as of 04/09/22 1807  Mon Apr 09, 2022  1535 4 days of body aches.   [WF]    Clinical Course User Index [WF] Tedd Sias, [place]                           Medical Decision Making Amount and/or  Complexity of Data Reviewed Labs: ordered.  Risk OTC drugs. Prescription drug management.   Patient with generalized myalgias for the past 4 days was sent by PCP office because she has some neck myalgias.  She has palpable muscular tenderness in her neck.  She does not seem to have any  meningeal signs specifically is able to move her head around quite well although she does seem to have some right lateral neck pain when she looks left.  Seems to be having myalgias.  CMP with normal kidney function electrolytes CBC unremarkable COVID influenza negative.   She feels much improved after Tylenol Reglan Benadryl.  Reglan provided because of headache  We will discharge home with close follow-up with PCP.  Return precautions discussed.  Final Clinical Impression(s) / ED Diagnoses Final diagnoses:  Myalgia  Neck pain    Rx / DC Orders ED Discharge Orders          Ordered    lidocaine (LIDODERM) 5 %  Every 24 hours        04/09/22 1802    diclofenac Sodium (VOLTAREN) 1 % GEL  4 times daily        04/09/22 1802              Tedd Sias, [place] 04/09/22 2236    Lucrezia Starch, MD 04/10/22 1919

## 2022-04-09 NOTE — Patient Instructions (Addendum)
It was great to see you!  Due to the severity of your symptoms -- the recommendation is to proceed to the ER.  [facility] Address: [address] [place], [place] [address]  Take care,  Inda Coke PA-C

## 2022-04-09 NOTE — ED Triage Notes (Signed)
Patient here POV from Home.  Endorses Symptoms such as Body Aches, Chills, Neck Pain, Headaches that began Friday. Seen at [facility] and was told to Follow Up with PCP as Respiratory Panels were Negative. Seen by PA at [facility] and was instructed to seek ED Evaluation.   No Sore Throat, No N/V/D.   NAD Noted during Triage. A&Oc4. GCS 15. Ambulatory.

## 2022-04-09 NOTE — Progress Notes (Signed)
Margaret White is a 71 y.o. female here for a new problem.  History of Present Illness:   Chief Complaint  Patient presents with   Chills    Patient states she has still been having flu like symptoms like headaches, chills , loss of appetite and have been getting soaked while getting up at night. She went to ED on Friday and has not gotten better.    HPI  Malaise and Fatigue Patient endorses flu-like sx since Friday. She actually went to UC in [place] on Friday to be seen. She was told she had a likely viral illness, but was prescribed azithromycin. She is taking this as prescribed. Had COVID, flu, strep testing done at [facility] -- all negative. Having body aches and significant neck pain.  She has had worsening symptoms with time -- diaphoresis, severe pressure in sinuses and when bending down, chills, lack of taste, nausea. She has not tried anything OTC for her symptoms.  Denies:  No diarrhea. Having significant fatigue. No recent tick borne illness. Went to [place], [place] about 1.5 weeks ago to see her her grandson but denies any concerning illness exposure.   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   Bilateral right worse than left that is mild in nature X-rays obtained in [place] on 07/12/2017 that are available through the [facility] PACS system   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   History of colon polyps    History of hyperthyroidism 2005    History of pneumonia, recurrent    History of seizure 2006   x1   IBS (irritable bowel syndrome)    IBS (irritable bowel syndrome)    Mixed hyperlipidemia    Nodule of lower lobe of left lung 10/2018   51m;   pulmologist-- dr iMeda Coffee bLeory Plowman stable per lov note in epic   Ocular rosacea    OSA (obstructive sleep apnea)    per pt intolerant to cpap due to sinus issues,  uses mouth guard    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   [facility]    Pneumonia    Primary osteoarthritis of both hands 07/10/2017   PTSD (post-traumatic stress disorder)    Recurrent major depressive disorder, in full remission ([place] 06/26/2017   RLS (restless legs syndrome)    Seasonal allergies    Seizure ([place] 2006   x 1   Sleep apnea    Solitary pulmonary nodule    pulmologist-- dr iMeda Coffee bLeory Plowman, lov note in epic,  stable   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 Shallow disc bulging without significant neuroforaminal stenosis.   SUI (stress urinary incontinence, female)    Vitamin B 12 deficiency  Vitamin D deficiency      Social History   Tobacco Use   Smoking status: Never   Smokeless tobacco: Never  Vaping Use   Vaping Use: Never used  Substance Use Topics   Alcohol use: No   Drug use: Never    Past Surgical History:  Procedure Laterality Date   ABDOMINOPLASTY  2006   ANAL FISSURE REPAIR  2006   BIOPSY  10/27/2020   Procedure: BIOPSY;  Surgeon: Rush Landmark Telford Nab., MD;  Location: [facility];  Service: Gastroenterology;;   BLADDER SURGERY  1984   BREAST BIOPSY Right     benign     CARPAL TUNNEL RELEASE Bilateral 1989   CATARACT EXTRACTION W/ INTRAOCULAR LENS  IMPLANT, BILATERAL  2017   COLONOSCOPY     COLONOSCOPY W/ POLYPECTOMY  2017   ECTOPIC PREGNANCY SURGERY  1979   ESOPHAGOGASTRODUODENOSCOPY (EGD) WITH  PROPOFOL N/A 10/27/2020   Procedure: ESOPHAGOGASTRODUODENOSCOPY (EGD) WITH PROPOFOL;  Surgeon: Irving Copas., MD;  Location: [facility];  Service: Gastroenterology;  Laterality: N/A;   EYE SURGERY  child    x4  1957-1960   INCONTINENCE SURGERY     NASAL SEPTUM SURGERY  1987   PUBOVAGINAL SLING N/A 03/24/2019   Procedure: CYSTOSCOPY  Gaynelle Arabian;  Surgeon: Bjorn Loser, MD;  Location: [facility];  Service: Urology;  Laterality: N/A;   REVISION TOTAL HIP ARTHROPLASTY Right    RHINOPLASTY  2007   TOTAL ABDOMINAL HYSTERECTOMY W/ BILATERAL SALPINGOOPHORECTOMY  1984   w/ Marshall-Marchetti (bladder tack suspension)   TOTAL HIP ARTHROPLASTY Right 04/11/2018   Procedure: RIGHT TOTAL HIP ARTHROPLASTY ANTERIOR APPROACH;  Surgeon: Mcarthur Rossetti, MD;  Location: [facility];  Service: Orthopedics;  Laterality: Right;   ULNAR NERVE TRANSPOSITION Left 1990s   UPPER ESOPHAGEAL ENDOSCOPIC ULTRASOUND (EUS) N/A 10/27/2020   Procedure: UPPER ESOPHAGEAL ENDOSCOPIC ULTRASOUND (EUS);  Surgeon: Irving Copas., MD;  Location: [facility];  Service: Gastroenterology;  Laterality: N/A;    Family History  Problem Relation Age of Onset   Hypertension Mother    Congestive Heart Failure Mother    Multiple myeloma Mother    Heart disease Father    Prostate cancer Father    Emphysema Sister    Heart disease Brother    Other Brother        brain tumor   Heart disease Maternal Grandmother    Emphysema Maternal Grandmother    CAD Maternal Grandfather    Heart disease Maternal Grandfather    Cancer Paternal Grandmother        type unknown, mets   Alcoholism Paternal Grandfather    Colon cancer Neg Hx    Esophageal cancer Neg Hx    Stomach cancer Neg Hx    Rectal cancer Neg Hx     Allergies  Allergen Reactions   Codeine Itching   Sulfa Antibiotics Rash and Other (See Comments)    Flu like symptoms    Current Medications:   Current Outpatient  Medications:    amLODipine (NORVASC) 5 MG tablet, TAKE 1 TABLET DAILY, Disp: 90 tablet, Rfl: 3   calcium carbonate (OS-CAL) 600 MG TABS tablet, Take 600 mg by mouth daily with breakfast., Disp: , Rfl:    cetirizine (ZYRTEC) 10 MG tablet, Take 10 mg by mouth daily., Disp: , Rfl:    DULoxetine (CYMBALTA) 60 MG capsule, TAKE 1 CAPSULE BY MOUTH EVERY DAY, Disp: 90 capsule, Rfl: 3   esomeprazole (NEXIUM) 40 MG capsule, Take 1 capsule (40 mg total)  by mouth 2 (two) times daily before a meal., Disp: 180 capsule, Rfl: 3   ezetimibe (ZETIA) 10 MG tablet, Take 1 tablet (10 mg total) by mouth daily., Disp: 90 tablet, Rfl: 3   FLUoxetine (PROZAC) 10 MG tablet, TAKE 1 TABLET DAILY, Disp: 90 tablet, Rfl: 3   fluticasone (FLONASE) 50 MCG/ACT nasal spray, USE 1 SPRAY IN EACH NOSTRIL DAILY AS NEEDED FOR ALLERGIES, Disp: 48 g, Rfl: 5   gabapentin (NEURONTIN) 300 MG capsule, Take 1 capsule (300 mg total) by mouth 3 (three) times daily., Disp: 21 capsule, Rfl: 0   Iron Polysacch Cmplx-B12-FA 150-0.025-1 MG CAPS, Take 1 capsule by mouth 2 (two) times daily., Disp: 60 capsule, Rfl: 2   Polyvinyl Alcohol-Povidone PF (REFRESH) 1.4-0.6 % SOLN, Place 1 drop into both eyes daily as needed (Dry eye)., Disp: , Rfl:    Review of Systems:   ROS Negative unless otherwise specified per HPI.   Vitals:   Vitals:   04/09/22 1157  BP: 116/78  Pulse: 87  Resp: 18  Temp: 98.3 F (36.8 C)  TempSrc: Temporal  SpO2: 95%  Weight: 155 lb 9.6 oz (70.6 kg)  Height: 5' 4"  (1.626 m)     Body mass index is 26.71 kg/m.  Physical Exam:   Physical Exam Vitals and nursing note reviewed.  Constitutional:      General: She is not in acute distress.    Appearance: She is well-developed. She is ill-appearing. She is not toxic-appearing.  Cardiovascular:     Rate and Rhythm: Normal rate and regular rhythm.     Pulses: Normal pulses.     Heart sounds: Normal heart sounds, S1 normal and S2 normal.  Pulmonary:     Effort:  Pulmonary effort is normal.     Breath sounds: Normal breath sounds.  Musculoskeletal:     Cervical back: Pain with movement present.  Skin:    General: Skin is warm and dry.  Neurological:     General: No focal deficit present.     Mental Status: She is alert.     GCS: GCS eye subscore is 4. GCS verbal subscore is 5. GCS motor subscore is 6.     Cranial Nerves: Cranial nerves 2-12 are intact.     Sensory: Sensation is intact.     Motor: Motor function is intact.     Coordination: Coordination is intact.  Psychiatric:        Speech: Speech normal.        Behavior: Behavior normal. Behavior is cooperative.     Assessment and Plan:   Malaise and fatigue Worsening Due to worsening symptoms, pain with movement of neck, will refer to ER for further evaluation and management. She was in agreement to plan.  Inda Coke, PA-C

## 2022-04-09 NOTE — ED Notes (Signed)
Dc instructions reviewed with patient. Patient voiced understanding. Dc with belongings.  °

## 2022-04-09 NOTE — Discharge Instructions (Addendum)
I suspect that your symptoms are related to muscle pain.  I have printed some generic muscle pain instructions for you below but I also recommend Tylenol ibuprofen, making sure that you hydrate well and use the Lidoderm patches that I prescribed you or the Voltaren gel that I prescribed you.  You may also alternate between the 2  Please use Tylenol or ibuprofen for pain.  You may use 600 mg ibuprofen every 6 hours or 1000 mg of Tylenol every 6 hours.  You may choose to alternate between the 2.  This would be most effective.  Not to exceed 4 g of Tylenol within 24 hours.  Not to exceed 3200 mg ibuprofen 24 hours.    Your examination today is most concerning for a muscular injury 1. Medications: alternate ibuprofen and tylenol for pain control, take all usual home medications as they are prescribed 2. Treatment: rest, ice, elevate and use an ACE wrap or other compressive therapy to decrease swelling. Also drink plenty of fluids and do plenty of gentle stretching and move the affected muscle through its normal range of motion to prevent stiffness. 3. Follow Up: If your symptoms do not improve please follow up with orthopedics/sports medicine or your PCP for discussion of your diagnoses and further evaluation after today's visit; if you do not have a primary care doctor use the resource guide provided to find one; Please return to the ER for worsening symptoms or other concerns.

## 2022-04-18 ENCOUNTER — Ambulatory Visit (INDEPENDENT_AMBULATORY_CARE_PROVIDER_SITE_OTHER): Payer: Medicare Other | Admitting: Orthopaedic Surgery

## 2022-04-18 ENCOUNTER — Encounter: Payer: Self-pay | Admitting: Orthopaedic Surgery

## 2022-04-18 ENCOUNTER — Ambulatory Visit (INDEPENDENT_AMBULATORY_CARE_PROVIDER_SITE_OTHER): Payer: Medicare Other

## 2022-04-18 DIAGNOSIS — M25561 Pain in right knee: Secondary | ICD-10-CM

## 2022-04-18 NOTE — Progress Notes (Signed)
The patient is a very active 71 year old female that I have seen before and actually replaced the hip before.  She has been having right knee pain for about a month but really not a pain more of popping and catching feeling in the knee.  It feels like a give way on her.  She tried a knee brace and tried Advil.  She feels like it is getting give out at times.  She denies any injuries and is never had surgery on that knee.  She does workout regularly and uses a treadmill at her end of setting but does have to go up and down hills on the treadmill.  Examination of her right knee today shows no effusion.  Her range of motion is full.  Her knee is ligamentously stable.  There is no significant patellofemoral crepitation either.  There is negative Lachman's and negative McMurray's exam.  2 views of the right knee show normal-appearing knee with normal alignment and well-maintained joint space with no acute findings.  I would like her to stop using the treadmill and work on quad strengthening exercises.  If the weeks go by and she still having symptoms of instability she will let us know because a MRI of her knee would be warranted at that standpoint.  All question concerns were answered and addressed.  Follow-up is as needed.

## 2022-05-11 ENCOUNTER — Other Ambulatory Visit: Payer: Self-pay

## 2022-05-11 ENCOUNTER — Encounter: Payer: Self-pay | Admitting: Family Medicine

## 2022-05-11 DIAGNOSIS — E782 Mixed hyperlipidemia: Secondary | ICD-10-CM

## 2022-05-11 MED ORDER — EZETIMIBE 10 MG PO TABS: 10.0000 mg | ORAL_TABLET | Freq: Every day | ORAL | 3 refills | Status: DC

## 2022-05-31 ENCOUNTER — Other Ambulatory Visit: Payer: Self-pay | Admitting: Family Medicine

## 2022-07-02 ENCOUNTER — Encounter: Payer: Self-pay | Admitting: *Deleted

## 2022-07-03 ENCOUNTER — Ambulatory Visit (INDEPENDENT_AMBULATORY_CARE_PROVIDER_SITE_OTHER): Payer: Medicare Other | Admitting: Internal Medicine

## 2022-07-03 ENCOUNTER — Ambulatory Visit (INDEPENDENT_AMBULATORY_CARE_PROVIDER_SITE_OTHER)
Admission: RE | Admit: 2022-07-03 | Discharge: 2022-07-03 | Disposition: A | Discharge status: Home / Self Care | Payer: Medicare Other | Source: Ambulatory Visit | Attending: Internal Medicine | Admitting: Internal Medicine

## 2022-07-03 ENCOUNTER — Encounter: Payer: Self-pay | Admitting: Internal Medicine

## 2022-07-03 VITALS — BP 115/75 | HR 85 | Temp 97.5°F | Resp 14 | Ht 64.0 in | Wt 162.6 lb

## 2022-07-03 DIAGNOSIS — Z8249 Family history of ischemic heart disease and other diseases of the circulatory system: Secondary | ICD-10-CM

## 2022-07-03 DIAGNOSIS — R0600 Dyspnea, unspecified: Secondary | ICD-10-CM | POA: Diagnosis not present

## 2022-07-03 DIAGNOSIS — R0609 Other forms of dyspnea: Secondary | ICD-10-CM | POA: Diagnosis not present

## 2022-07-03 DIAGNOSIS — M419 Scoliosis, unspecified: Secondary | ICD-10-CM | POA: Diagnosis not present

## 2022-07-03 HISTORY — DX: Family history of ischemic heart disease and other diseases of the circulatory system: Z82.49

## 2022-07-03 LAB — CBC
HCT: 40.8 % (ref 36.0–46.0)
Hemoglobin: 13.7 g/dL (ref 12.0–15.0)
MCHC: 33.6 g/dL (ref 30.0–36.0)
MCV: 89.1 fl (ref 78.0–100.0)
Platelets: 303 10*3/uL (ref 150.0–400.0)
RBC: 4.58 Mil/uL (ref 3.87–5.11)
RDW: 13.8 % (ref 11.5–15.5)
WBC: 4.5 10*3/uL (ref 4.0–10.5)

## 2022-07-03 LAB — TSH: TSH: 3.09 u[IU]/mL (ref 0.35–5.50)

## 2022-07-03 LAB — T3, FREE: T3, Free: 3.2 pg/mL (ref 2.3–4.2)

## 2022-07-03 LAB — BRAIN NATRIURETIC PEPTIDE: Pro B Natriuretic peptide (BNP): 18 pg/mL (ref 0.0–100.0)

## 2022-07-03 LAB — T4, FREE: Free T4: 0.6 ng/dL (ref 0.60–1.60)

## 2022-07-03 NOTE — Assessment & Plan Note (Signed)
This is a new problem of fatigue and difficulty catching breath since giving blood early September 2023.  The possible causes we reviewed below and include multiple serious issues.  We developed workup plan via shared decision making and she assured me she would get labwork and xray and remain available for phone call about the results, and go to ER if her condition worsens.  I CC to her care team.    Considered Dx:   a. Chronic Obstructive Pulmonary Disease (COPD): Given the patient's history of obstructive sleep apnea, chronic sinusitis, and a chronic productive cough, COPD could be a likely diagnosis. The shortness of breath, especially on exertion, and the decrease in exercise tolerance are classic symptoms of COPD. The presence of a history of smoking or exposure to environmental pollutants could further suggest this diagnosis- she has second hand smoke exposure... but no wheezing on clinical exam  b. Congestive Heart Failure (CHF): CHF could explain the patient's symptoms of exertional dyspnea and decreased exercise tolerance. The patient's history of hypertension is a risk factor for CHF. The presence of signs such as peripheral edema, jugular venous distension, or abnormal heart sounds on physical examination could further suggest this diagnosis.  None of these signs were found on physical exam today.  BNP was tested and negative  c. Lyme Disease: Given the patient's history of a tick bite and the subsequent development of a rash and symptoms such as fatigue and shortness of breath, Lyme disease could be a possible diagnosis. The presence of erythema migrans or a positive serologic test for Borrelia burgdorferi could further suggest this diagnosis.  d. Anemia: The patient's history of blood donation and her subsequent symptoms of fatigue and shortness of breath could suggest anemia. The presence of low hemoglobin or hematocrit levels could further suggest this diagnosis.  She denies any blood in  stool or urine and they only took one bag of blood though, so this seems unlikely  e. Pulmonary Embolism (PE): Given the patient's recent blood donation and her symptoms of shortness of breath and decreased exercise tolerance, a PE could be a possible diagnosis. The presence of risk factors such as recent surgery, immobility, or a hypercoagulable state could further suggest this diagnosis, but she has none of these risks.  Also her HR is low, and she denies any chest pain or hemoptysis.  f. Hypothyroidism: Hypothyroidism could explain the patient's symptoms of fatigue and decreased motivation. The presence of other symptoms such as weight gain, cold intolerance, or constipation, or a high TSH level could further suggest this diagnosis.   These symptoms were not reported  g. Pneumonia: Given the patient's history of pneumonia and her current symptoms of a productive cough and shortness of breath, a recurrent pneumonia could be a possible diagnosis. The presence of fever, chills, or abnormal lung sounds on physical examination could further suggest this diagnosis. .. But none of that was present on todays history or physical exam.  We will get XR to be sure.  h. Lung Cancer: Given the patient's history of a solitary pulmonary nodule, lung cancer could be a possible diagnosis. The presence of symptoms such as weight loss, hemoptysis, or chest pain, or abnormal findings on chest imaging could further suggest this diagnosis.  This seems very unlikely with her recent history of only tiny 5 mm nodule on CT.  i. Asthma: Given the patient's history of seasonal allergies and her symptoms of shortness of breath and a chronic cough, asthma could be a possible  diagnosis. The presence of wheezing on physical examination or a positive bronchodilator response on spirometry could further suggest this diagnosis.  I encouraged her to get PFTs with her pulmonologist, and offered steroid trial but she declined.  j.  Sarcoidosis: Sarcoidosis could explain the patient's symptoms of shortness of breath, fatigue, and a chronic cough. The presence of noncaseating granulomas on biopsy or abnormal findings on chest imaging could further suggest this diagnosis.  K. Chronic Fatigue Syndrome  L.  Vitamin Deficiency   Patient is instructed to call or message via MyChart if she has any questions or concerns regarding our treatment plan.   No barriers to understanding were identified.   We discussed Red Flag symptoms and signs in detail. She expressed understanding regarding what to do in case of urgent or emergency type symptoms.   She was advised to call the office or go to ER if her condition worsens.  Additional information was provided in the AVS (see AVS) regarding the diagnosis/treatment plan for her to review

## 2022-07-03 NOTE — Patient Instructions (Addendum)
It was a pleasure seeing you today!  Today the plan is...  Dyspnea on exertion Assessment & Plan: This is a new problem of fatigue and difficulty catching breath since giving blood early September 2023.  The possible causes we reviewed below and include multiple serious issues.  We developed workup plan via shared decision making and she assured me she would get labwork and xray and remain available for phone call about the results, and go to ER if her condition worsens.  I CC to her care team.    Considered Dx:   a. Chronic Obstructive Pulmonary Disease (COPD): Given the patient's history of obstructive sleep apnea, chronic sinusitis, and a chronic productive cough, COPD could be a likely diagnosis. The shortness of breath, especially on exertion, and the decrease in exercise tolerance are classic symptoms of COPD. The presence of a history of smoking or exposure to environmental pollutants could further suggest this diagnosis- she has second hand smoke exposure... but no wheezing on clinical exam  b. Congestive Heart Failure (CHF): CHF could explain the patient's symptoms of exertional dyspnea and decreased exercise tolerance. The patient's history of hypertension is a risk factor for CHF. The presence of signs such as peripheral edema, jugular venous distension, or abnormal heart sounds on physical examination could further suggest this diagnosis.  None of these signs were found on physical exam today.  BNP was tested and negative  c. Lyme Disease: Given the patient's history of a tick bite and the subsequent development of a rash and symptoms such as fatigue and shortness of breath, Lyme disease could be a possible diagnosis. The presence of erythema migrans or a positive serologic test for Borrelia burgdorferi could further suggest this diagnosis.  d. Anemia: The patient's history of blood donation and her subsequent symptoms of fatigue and shortness of breath could suggest anemia. The  presence of low hemoglobin or hematocrit levels could further suggest this diagnosis.  She denies any blood in stool or urine and they only took one bag of blood though, so this seems unlikely  e. Pulmonary Embolism (PE): Given the patient's recent blood donation and her symptoms of shortness of breath and decreased exercise tolerance, a PE could be a possible diagnosis. The presence of risk factors such as recent surgery, immobility, or a hypercoagulable state could further suggest this diagnosis, but she has none of these risks.  Also her HR is low, and she denies any chest pain or hemoptysis.  f. Hypothyroidism: Hypothyroidism could explain the patient's symptoms of fatigue and decreased motivation. The presence of other symptoms such as weight gain, cold intolerance, or constipation, or a high TSH level could further suggest this diagnosis.   These symptoms were not reported  g. Pneumonia: Given the patient's history of pneumonia and her current symptoms of a productive cough and shortness of breath, a recurrent pneumonia could be a possible diagnosis. The presence of fever, chills, or abnormal lung sounds on physical examination could further suggest this diagnosis. .. But none of that was present on todays history or physical exam.  We will get XR to be sure.  h. Lung Cancer: Given the patient's history of a solitary pulmonary nodule, lung cancer could be a possible diagnosis. The presence of symptoms such as weight loss, hemoptysis, or chest pain, or abnormal findings on chest imaging could further suggest this diagnosis.  This seems very unlikely with her recent history of only tiny 5 mm nodule on CT.  i. Asthma: Given the patient's history  of seasonal allergies and her symptoms of shortness of breath and a chronic cough, asthma could be a possible diagnosis. The presence of wheezing on physical examination or a positive bronchodilator response on spirometry could further suggest this diagnosis.  I  encouraged her to get PFTs with her pulmonologist, and offered steroid trial but she declined.  j. Sarcoidosis: Sarcoidosis could explain the patient's symptoms of shortness of breath, fatigue, and a chronic cough. The presence of noncaseating granulomas on biopsy or abnormal findings on chest imaging could further suggest this diagnosis.  K. Chronic Fatigue Syndrome  L.  Vitamin Deficiency  Patient is instructed to call or message via MyChart if she has any questions or concerns regarding our treatment plan.  No barriers to understanding were identified.  We discussed Red Flag symptoms and signs in detail. She expressed understanding regarding what to do in case of urgent or emergency type symptoms.  She was advised to call the office or go to ER if her condition worsens. Additional information was provided in the AVS (see AVS) regarding the diagnosis/treatment plan for her to review   Orders: -     CBC -     DG Chest 2 View; Future -     D-dimer, quantitative -     Lyme Disease Serology w/Reflex -     Troponin I - -     Brain natriuretic peptide -     Ambulatory referral to Cardiology -     TSH -     T3, free -     T4, free -     EKG 12-Lead  Family history of abdominal aortic aneurysm (AAA)   Loralee Pacas, MD   No follow-ups on file.   - If your condition fails to resolve or you have other questions / concerns: please contact me via phone [phone] or MyChart messaging.  - Please bring all your medicines to your next appointment. This is the best way for me to know exactly what you're taking.  - If your condition begins to worsen or become severe:  go to the ER.   IF you received an x-ray today, you will receive an invoice from [facility]. Please contact [facility] at [phone] with questions or concerns regarding your invoice.    IF you received labwork today, you will receive an invoice from [place]. Please contact [facility] at  [phone] with questions or concerns regarding your invoice.    Our billing staff will not be able to assist you with questions regarding bills from these companies.   --------------------------------------------------------------------------------------------------------------------  You will be contacted with the lab results as soon as they are available. The fastest way to get your results is to activate your My Chart account. Instructions are located on the last page of this paperwork. If you have not heard from [place] regarding the results in 2 weeks, please contact this office. For any labs or imaging tests, we will call you if the results are significantly abnormal.  Most normal results will be posted to myChart as soon as they are available and I will comment on them there within 2-3 business days.

## 2022-07-03 NOTE — Progress Notes (Signed)
[place] [facility] [employer]:  [phone]   Routine Medical Office Visit  Patient:  Margaret White      Age: 71 y.o.       Sex:  female  Date:   07/03/2022  PCP:    Leamon Arnt, [place] Provider: Loralee Pacas, MD  Assessment/Plan:     Margaret White was seen today for shortness of breath.  Dyspnea on exertion Overview: Presented 07/03/22 with a past medical history of a solitary pulmonary nodule, seasonal allergies, pneumonia, obstructive sleep apnea, chronic sinusitis, hypertension, and nutritional deficiencies. She has been experiencing worsening shortness of breath for about two weeks, which she describes as a reduction in her ability to exercise. She can walk as far as she wants without too much shortness of breath, but finds it difficult to catch her breath when she stops. She has also noticed a decrease in her general energy level and motivation over the last two to four weeks. She first recognized the severity of her dyspnea when she donated blood at the [facility] two weeks ago. She has been taking iron supplements with little improvement. Her globin level at the time of the blood draw was 14. She denies having fevers. She had a rash on her right side a month ago that has since resolved, and she was bitten by a tick one to two months ago on the back of her head. She has a chronic cough that produces chunky mucus. Her brother was recently diagnosed with an abdominal aortic aneurysm. No medications, allergies, social history, physical examination, laboratory data, course, imaging data, or other studies are mentioned in the patient summary.   Assessment & Plan: This is a new problem of fatigue and difficulty catching breath since giving blood early September 2023.  The possible causes we reviewed below and include multiple serious issues.  We developed workup plan via shared decision making and she assured me she would get labwork and xray and remain available  for phone call about the results, and go to ER if her condition worsens.  I CC to her care team.    Considered Dx:   a. Chronic Obstructive Pulmonary Disease (COPD): Given the patient's history of obstructive sleep apnea, chronic sinusitis, and a chronic productive cough, COPD could be a likely diagnosis. The shortness of breath, especially on exertion, and the decrease in exercise tolerance are classic symptoms of COPD. The presence of a history of smoking or exposure to environmental pollutants could further suggest this diagnosis- she has second hand smoke exposure... but no wheezing on clinical exam  b. Congestive Heart Failure (CHF): CHF could explain the patient's symptoms of exertional dyspnea and decreased exercise tolerance. The patient's history of hypertension is a risk factor for CHF. The presence of signs such as peripheral edema, jugular venous distension, or abnormal heart sounds on physical examination could further suggest this diagnosis.  None of these signs were found on physical exam today.  BNP was tested and negative  c. Lyme Disease: Given the patient's history of a tick bite and the subsequent development of a rash and symptoms such as fatigue and shortness of breath, Lyme disease could be a possible diagnosis. The presence of erythema migrans or a positive serologic test for Borrelia burgdorferi could further suggest this diagnosis.  d. Anemia: The patient's history of blood donation and her subsequent symptoms of fatigue and shortness of breath could suggest anemia. The presence of low hemoglobin or hematocrit levels  could further suggest this diagnosis.  She denies any blood in stool or urine and they only took one bag of blood though, so this seems unlikely  e. Pulmonary Embolism (PE): Given the patient's recent blood donation and her symptoms of shortness of breath and decreased exercise tolerance, a PE could be a possible diagnosis. The presence of risk factors such as  recent surgery, immobility, or a hypercoagulable state could further suggest this diagnosis, but she has none of these risks.  Also her HR is low, and she denies any chest pain or hemoptysis.  f. Hypothyroidism: Hypothyroidism could explain the patient's symptoms of fatigue and decreased motivation. The presence of other symptoms such as weight gain, cold intolerance, or constipation, or a high TSH level could further suggest this diagnosis.   These symptoms were not reported  g. Pneumonia: Given the patient's history of pneumonia and her current symptoms of a productive cough and shortness of breath, a recurrent pneumonia could be a possible diagnosis. The presence of fever, chills, or abnormal lung sounds on physical examination could further suggest this diagnosis. .. But none of that was present on todays history or physical exam.  We will get XR to be sure.  h. Lung Cancer: Given the patient's history of a solitary pulmonary nodule, lung cancer could be a possible diagnosis. The presence of symptoms such as weight loss, hemoptysis, or chest pain, or abnormal findings on chest imaging could further suggest this diagnosis.  This seems very unlikely with her recent history of only tiny 5 mm nodule on CT.  i. Asthma: Given the patient's history of seasonal allergies and her symptoms of shortness of breath and a chronic cough, asthma could be a possible diagnosis. The presence of wheezing on physical examination or a positive bronchodilator response on spirometry could further suggest this diagnosis.  I encouraged her to get PFTs with her pulmonologist, and offered steroid trial but she declined.  j. Sarcoidosis: Sarcoidosis could explain the patient's symptoms of shortness of breath, fatigue, and a chronic cough. The presence of noncaseating granulomas on biopsy or abnormal findings on chest imaging could further suggest this diagnosis.  K. Chronic Fatigue Syndrome  L.  Vitamin Deficiency  Patient  is instructed to call or message via MyChart if she has any questions or concerns regarding our treatment plan.  No barriers to understanding were identified.  We discussed Red Flag symptoms and signs in detail. She expressed understanding regarding what to do in case of urgent or emergency type symptoms.  She was advised to call the office or go to ER if her condition worsens. Additional information was provided in the AVS (see AVS) regarding the diagnosis/treatment plan for her to review   Orders: -     CBC -     DG Chest 2 View; Future -     D-dimer, quantitative -     Lyme Disease Serology w/Reflex -     Troponin I - -     Brain natriuretic peptide -     Ambulatory referral to Cardiology -     TSH -     T3, free -     T4, free -     EKG 12-Lead  Family history of abdominal aortic aneurysm (AAA) Overview: brother     No definitive diagnosis or treatment could be determined today, but an extensive review and workup plan was devised: We used shared decision making and reviewed the following differential together to decide on a workup plan.  She declined prednisone trial for  possible asthma/copd.  PFT 2020 were reviewed and noted to be normal.  Also noted advair trial back then failed.    After she left I studied her chart and could not find the last pulmonology check in... but I found the last CT from 11/23/2019 where a subtle hazy ground-glass opacification over the right lower lobe just abvoe the idaphargm likely atelectasis, recommend follow up CT in 4-6 weeks.  Also 4--5 mm noduile over superior segment of the left lower lobe stable for 1 year so no further workup.  If she never had the follow up CT it might be worth doing.. but xray looks pretty clear. I reviewed XR chest prior to closing this note- it was clear of any medical issues.  I will await for all labs to return and discuss further with her then.    Subjective:   Margaret White is a 71 y.o. female with PMH significant  for: Past Medical History:  Diagnosis Date   Allergic rhinitis    Allergy    Anxiety    Asthma    Avascular necrosis of hip, right ([place]) 03/24/2018   Cataract    Chronic cluster headache    Chronic sinusitis    Diabetes mellitus type 2, diet-controlled ([place])    per pt currently no taking metformin, followed by pcp   Eczema    Essential hypertension    cardiologsit-- dr dr Bettina Gavia   Family history of abdominal aortic aneurysm (AAA) 07/03/2022   brother   Fatty liver    Fibromyalgia    GERD (gastroesophageal reflux disease)    Hepatic steatosis 10/14/2017   Hiatal hernia    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   Bilateral right worse than left that is mild in nature X-rays obtained in [place] on 07/12/2017 that are available through the [facility] PACS system   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   History of colon polyps    History of hyperthyroidism 2005   History of pneumonia, recurrent    History of seizure 2006   x1   IBS (irritable bowel syndrome)    IBS (irritable bowel syndrome)    Mixed hyperlipidemia    Nodule of lower lobe of left lung 10/2018   95m;   pulmologist-- dr iMeda Coffee bLeory Plowman stable per lov note in epic   Ocular rosacea    OSA (obstructive sleep apnea)    per pt intolerant to cpap due to sinus issues,  uses mouth guard    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   [facility]    Pneumonia    Primary osteoarthritis of both hands 07/10/2017   PTSD (post-traumatic stress disorder)    Recurrent major depressive disorder, in full remission ([place] 06/26/2017   RLS (restless legs syndrome)    Seasonal allergies    [place]   x 1   Sleep apnea    Solitary pulmonary nodule    pulmologist-- dr iMeda Coffee bLeory Plowman, lov note in epic,  stable   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 Shallow disc bulging without significant neuroforaminal stenosis.   SUI (stress urinary incontinence, female)    Vitamin B 12 deficiency    Vitamin D deficiency      She main concern for today's visit is: CRisk analystComplaint  Patient presents with   Shortness of Breath    Upon exertion for about the last two weeks.     Additional physician collected history: Her brother was recently diagnosed with abdominal aortic aneurysm She mainly is concerned with worsening shortness of breath for about 2 weeks that she describes as reduction in ability to exercise when she was previously able to go to the gym currently she can walk about as far as she wants to go without too much shortness of breath but when she stops its difficult to catch her breath Reviewing her past medical history the items that seem like they could be connected to her recent exertional shortness of breath include solitary pulmonary nodule which she follows for pulmonary, seasonal allergies, history of pneumonia, obstructive sleep apnea, chronic sinusitis, hypertension, nutritional deficiencies. The last year or 2 she has had a chronic cough that is productive of chunky mucus she has noticed a falloff in her general energy level and motivation to get up and do things in the last 2 to 4 weeks Her first real recognition of the severity of the dyspnea/shortness of breath was when she gave blood at the [facility] 2 weeks ago she was noted noticeably struggling to get air.  Afterwards it started about 1 to 2 days afterwards not right at the same time She has been taking iron with little improvement really Globin at the time of the drawl was 14 and she gave a total of uncertain units (1 bagful) Denies fevers She does have a rash that came and went on her right side 1 month ago and she also was bitten by a tick about 1 to 2 months ago on the back of her head and that caused some swelling and pain but there was not a rash  right there where she was bitten    Review of Systems  Constitutional:  Positive for malaise/fatigue. Negative for chills, diaphoresis, fever and weight loss.  HENT:  Positive for tinnitus (30 years). Negative for congestion, ear discharge, ear pain, hearing loss, nosebleeds, sinus pain and sore throat.   Eyes:  Positive for blurred vision (determined to be due to ocular migraines) and photophobia (chronic). Negative for double vision, pain, discharge and redness.  Respiratory:  Positive for cough (chronic), sputum production (chronic) and shortness of breath (only after giving blood, but improving). Negative for hemoptysis, wheezing and stridor.   Cardiovascular:  Negative for chest pain, palpitations, orthopnea, claudication, leg swelling and PND.  Gastrointestinal:  Positive for constipation (chronic ibs not new), diarrhea (chronic ibs not new) and heartburn (occasional, not new). Negative for abdominal pain, blood in stool, melena, nausea and vomiting.  Genitourinary:  Negative for dysuria, flank pain, frequency, hematuria and urgency.  Musculoskeletal:  Positive for back pain (not new or worse) and neck pain (not new or worse than before). Negative for falls, joint pain and myalgias.  Skin:  Negative for itching and rash.  Neurological:  Positive for headaches (chronic unchanged). Negative for dizziness, tingling, tremors, sensory change, speech change, focal weakness, seizures (not recently, once long ago), loss of consciousness and weakness.  Endo/Heme/Allergies:  Positive for environmental allergies. Negative for polydipsia. Does not bruise/bleed easily.  Psychiatric/Behavioral:  Negative for depression, hallucinations, memory loss, substance abuse and suicidal ideas. The patient has insomnia (occasional not new). The patient is not nervous/anxious.        Objective:  Physical Exam: BP 115/75 (BP Location: Left Arm, Patient Position: Sitting)   Pulse 85   Temp Marland Kitchen)  97.5 F (36.4 C)  (Temporal)   Resp 14   Ht 5' 4"  (1.626 m)   Wt 162 lb 9.6 oz (73.8 kg)   SpO2 94%   BMI 27.91 kg/m   She  is a polite, friendly, and genuine person Constitutional: NAD, AAO, not ill-appearing  Neuro: alert, no focal deficit obvious, articulate speech Psych: normal mood, behavior, thought content   Problem specific physical exam findings:  There is no wheezing the lungs sound clear to auscultation bilaterally with very close monitoring as well as the heart has a regular rate and rhythm with no murmurs rubs or gallops and there is no S3 heart sound. No leg swelling.  Results:  Recent Results (from the past 2160 hour(s))  POC COVID-19     Status: Normal   Collection Time: 04/09/22 12:13 PM  Result Value Ref Range   SARS Coronavirus 2 Ag Negative Negative  Resp Panel by RT-PCR (Flu A&B, Covid) Anterior Nasal Swab     Status: None   Collection Time: 04/09/22 12:52 PM   Specimen: Anterior Nasal Swab  Result Value Ref Range   SARS Coronavirus 2 by RT PCR NEGATIVE NEGATIVE    Comment: (NOTE) SARS-CoV-2 target nucleic acids are NOT DETECTED.  The SARS-CoV-2 RNA is generally detectable in upper respiratory specimens during the acute phase of infection. The lowest concentration of SARS-CoV-2 viral copies this assay can detect is 138 copies/mL. A negative result does not preclude SARS-Cov-2 infection and should not be used as the sole basis for treatment or other patient management decisions. A negative result may occur with  improper specimen collection/handling, submission of specimen other than nasopharyngeal swab, presence of viral mutation(s) within the areas targeted by this assay, and inadequate number of viral copies(<138 copies/mL). A negative result must be combined with clinical observations, patient history, and epidemiological information. The expected result is Negative.  Fact Sheet for Patients:  EntrepreneurPulse.com.au  Fact Sheet for Healthcare  Providers:  IncredibleEmployment.be  This test is no t yet approved or cleared by the [place] FDA and  has been authorized for detection and/or diagnosis of SARS-CoV-2 by FDA under an Emergency Use Authorization (EUA). This EUA will remain  in effect (meaning this test can be used) for the duration of the COVID-19 declaration under Section 564(b)(1) of the Act, 21 U.S.C.section 360bbb-3(b)(1), unless the authorization is terminated  or revoked sooner.       Influenza A by PCR NEGATIVE NEGATIVE   Influenza B by PCR NEGATIVE NEGATIVE    Comment: (NOTE) The Xpert Xpress SARS-CoV-2/FLU/RSV plus assay is intended as an aid in the diagnosis of influenza from Nasopharyngeal swab specimens and should not be used as a sole basis for treatment. Nasal washings and aspirates are unacceptable for Xpert Xpress SARS-CoV-2/FLU/RSV testing.  Fact Sheet for Patients: EntrepreneurPulse.com.au  Fact Sheet for Healthcare Providers: IncredibleEmployment.be  This test is not yet approved or cleared by the [place] FDA and has been authorized for detection and/or diagnosis of SARS-CoV-2 by FDA under an Emergency Use Authorization (EUA). This EUA will remain in effect (meaning this test can be used) for the duration of the COVID-19 declaration under Section 564(b)(1) of the Act, 21 U.S.C. section 360bbb-3(b)(1), unless the authorization is terminated or revoked.  Performed at [facility], [address] [place], [place] [address]   Comprehensive metabolic panel     Status: Abnormal   Collection Time: 04/09/22  4:01 PM  Result Value Ref Range   Sodium 139 135 -  145 mmol/L   Potassium 3.9 3.5 - 5.1 mmol/L   Chloride 100 98 - 111 mmol/L   CO2 26 22 - 32 mmol/L   Glucose, Bld 103 (H) 70 - 99 mg/dL    Comment: Glucose reference range applies only to samples taken after fasting for at least 8 hours.   BUN 14 8 - 23  mg/dL   Creatinine, Ser 0.99 0.44 - 1.00 mg/dL   Calcium 9.8 8.9 - 10.3 mg/dL   Total Protein 8.0 6.5 - 8.1 g/dL   Albumin 4.6 3.5 - 5.0 g/dL   AST 22 15 - 41 U/L   ALT 30 0 - 44 U/L   Alkaline Phosphatase 79 38 - 126 U/L   Total Bilirubin 0.6 0.3 - 1.2 mg/dL   GFR, Estimated >60 >60 mL/min    Comment: (NOTE) Calculated using the CKD-EPI Creatinine Equation (2021)    Anion gap 13 5 - 15    Comment: Performed at [facility], [address] [place], [place] [address]  CBC with Differential     Status: Abnormal   Collection Time: 04/09/22  4:01 PM  Result Value Ref Range   WBC 5.4 4.0 - 10.5 K/uL   RBC 4.93 3.87 - 5.11 MIL/uL   Hemoglobin 13.9 12.0 - 15.0 g/dL   HCT 43.5 36.0 - 46.0 %   MCV 88.2 80.0 - 100.0 fL   MCH 28.2 26.0 - 34.0 pg   MCHC 32.0 30.0 - 36.0 g/dL   RDW 15.6 (H) 11.5 - 15.5 %   Platelets 214 150 - 400 K/uL   nRBC 0.0 0.0 - 0.2 %   Neutrophils Relative % 55 %   Neutro Abs 3.0 1.7 - 7.7 K/uL   Lymphocytes Relative 26 %   Lymphs Abs 1.4 0.7 - 4.0 K/uL   Monocytes Relative 14 %   Monocytes Absolute 0.8 0.1 - 1.0 K/uL   Eosinophils Relative 4 %   Eosinophils Absolute 0.2 0.0 - 0.5 K/uL   Basophils Relative 1 %   Basophils Absolute 0.0 0.0 - 0.1 K/uL   Immature Granulocytes 0 %   Abs Immature Granulocytes 0.01 0.00 - 0.07 K/uL    Comment: Performed at [facility], [place], [place], [place] [address]  CBC     Status: None   Collection Time: 07/03/22 12:53 PM  Result Value Ref Range   WBC 4.5 4.0 - 10.5 K/uL   RBC 4.58 3.87 - 5.11 Mil/uL   Platelets 303.0 150.0 - 400.0 K/uL   Hemoglobin 13.7 12.0 - 15.0 g/dL   HCT 40.8 36.0 - 46.0 %   MCV 89.1 78.0 - 100.0 fl   MCHC 33.6 30.0 - 36.0 g/dL   RDW 13.8 11.5 - 15.5 %  B Nat Peptide     Status: None   Collection Time: 07/03/22 12:53 PM  Result Value Ref Range   Pro B Natriuretic peptide (BNP) 18.0 0.0 - 100.0 pg/mL  TSH     Status: None   Collection Time:  07/03/22 12:53 PM  Result Value Ref Range   TSH 3.09 0.35 - 5.50 uIU/mL  T3, free     Status: None   Collection Time: 07/03/22 12:53 PM  Result Value Ref Range   T3, Free 3.2 2.3 - 4.2 pg/mL  T4, free     Status: None   Collection Time: 07/03/22 12:53 PM  Result Value Ref Range   Free T4 0.60 0.60 - 1.60 ng/dL    Comment: Specimens  from patients who are undergoing biotin therapy and /or ingesting biotin supplements may contain high levels of biotin.  The higher biotin concentration in these specimens interferes with this Free T4 assay.  Specimens that contain high levels  of biotin may cause false high results for this Free T4 assay.  Please interpret results in light of the total clinical presentation of the patient.       Ekg showed sinus bradycardia at 59 with twi in v1/v2 but  nothing else abnormal

## 2022-07-04 LAB — TROPONIN I: Troponin I: <3 ng/L (ref <=47)

## 2022-07-04 LAB — D-DIMER, QUANTITATIVE: D-Dimer, Quant: 0.43 mcg/mL FEU (ref <0.50)

## 2022-07-04 LAB — LYME DISEASE SEROLOGY W/REFLEX: Lyme Total Antibody EIA: NEGATIVE

## 2022-07-04 NOTE — Progress Notes (Signed)
I have reviewed your recent results and, in my medical opinion:  Lyme Testing and D-Dimer returned negative... .mostly ruling out Lymes disease or blood clot in lungs.  All the rest of the testing returned normal already, ruling out heart failure, heart attack, and thyroid problem.  The next tests I think are important will be with cardiology, an echocardiogram and maybe a stress test.  It seems you havent seen lung doctor in long time but I found 2020 pulmonary function tests showing no COPD at that time.  Still, if the heart tests are negative, I think your symptoms might be due to "exercise induced asthma"" so call for a referral to pulmonary if cardiology cant identify a cause for your shortness of breath with exertion.  Also feel free to follow up with me or Dr. Jonni Sanger as needed.  Loralee Pacas, MD  07/04/2022 1:30 PM

## 2022-07-04 NOTE — Progress Notes (Signed)
I have reviewed your recent results and, in my medical opinion:  Xray final read was consistent with my early read and review- nothing to explain your shortness of breath can be seen.  Loralee Pacas, MD  07/04/2022 2:51 PM

## 2022-07-18 ENCOUNTER — Other Ambulatory Visit: Payer: Self-pay | Admitting: Family Medicine

## 2022-07-18 DIAGNOSIS — M797 Fibromyalgia: Secondary | ICD-10-CM

## 2022-07-20 ENCOUNTER — Other Ambulatory Visit: Payer: Self-pay | Admitting: Family Medicine

## 2022-07-20 DIAGNOSIS — Z1231 Encounter for screening mammogram for malignant neoplasm of breast: Secondary | ICD-10-CM

## 2022-08-02 ENCOUNTER — Ambulatory Visit: Payer: Medicare Other | Admitting: Internal Medicine

## 2022-08-13 ENCOUNTER — Encounter: Payer: Self-pay | Admitting: Orthopaedic Surgery

## 2022-08-13 ENCOUNTER — Other Ambulatory Visit: Payer: Self-pay

## 2022-08-13 ENCOUNTER — Ambulatory Visit (INDEPENDENT_AMBULATORY_CARE_PROVIDER_SITE_OTHER): Payer: Medicare Other | Admitting: Orthopaedic Surgery

## 2022-08-13 DIAGNOSIS — G8929 Other chronic pain: Secondary | ICD-10-CM

## 2022-08-13 DIAGNOSIS — M25561 Pain in right knee: Secondary | ICD-10-CM | POA: Diagnosis not present

## 2022-08-13 NOTE — Progress Notes (Signed)
The patient is an active and young appearing 71 year old female well-known to me.  I saw her back in July with right knee locking and catching.  Her x-rays showed a normal-appearing knee.  She is worked on Astronomer exercises and activity modification and still is getting locking catching with the right knee that is now worsening.  She points to the posterior lateral aspect and posterior medial aspect of her right knee as a source of her pain.  She also has lateral joint line tenderness.  On exam she has a positive McMurray's sign to the lateral compartment of her knee.  There is pain with flexion past 90 degrees in the posterior aspect of her knee.  Her Lachman's exam is normal.  There is a mild effusion which she did not have before.  At this point a MRI of her right knee is warranted given the failure of conservative treatment for over 4 to 5 months now with continued mechanical symptoms of the right knee and normal-appearing x-rays.  We will see her back once we have this MRI.  She agrees with this treatment plan.

## 2022-08-27 ENCOUNTER — Ambulatory Visit: Payer: Medicare Other | Attending: Internal Medicine | Admitting: Internal Medicine

## 2022-08-27 ENCOUNTER — Encounter: Payer: Self-pay | Admitting: Internal Medicine

## 2022-08-27 ENCOUNTER — Ambulatory Visit: Payer: Medicare Other | Admitting: Family Medicine

## 2022-08-27 VITALS — BP 128/80 | HR 65 | Ht 63.5 in | Wt 165.0 lb

## 2022-08-27 DIAGNOSIS — R0609 Other forms of dyspnea: Secondary | ICD-10-CM | POA: Diagnosis not present

## 2022-08-27 DIAGNOSIS — E1169 Type 2 diabetes mellitus with other specified complication: Secondary | ICD-10-CM | POA: Diagnosis not present

## 2022-08-27 DIAGNOSIS — R011 Cardiac murmur, unspecified: Secondary | ICD-10-CM | POA: Diagnosis not present

## 2022-08-27 DIAGNOSIS — I10 Essential (primary) hypertension: Secondary | ICD-10-CM | POA: Diagnosis not present

## 2022-08-27 DIAGNOSIS — K7581 Nonalcoholic steatohepatitis (NASH): Secondary | ICD-10-CM | POA: Insufficient documentation

## 2022-08-27 DIAGNOSIS — Z8249 Family history of ischemic heart disease and other diseases of the circulatory system: Secondary | ICD-10-CM | POA: Insufficient documentation

## 2022-08-27 DIAGNOSIS — E785 Hyperlipidemia, unspecified: Secondary | ICD-10-CM | POA: Diagnosis not present

## 2022-08-27 DIAGNOSIS — G4733 Obstructive sleep apnea (adult) (pediatric): Secondary | ICD-10-CM | POA: Diagnosis not present

## 2022-08-27 NOTE — Progress Notes (Signed)
Cardiology Office Note:    Date:  08/27/2022   ID:  Margaret White, DOB 1951-04-09, MRN [id number]  PCP:  Margaret Arnt, MD   [place] Providers Cardiologist:  Werner Lean, MD     Referring MD: Margaret Arnt, MD   CC: DOE- check up Consulted for the evaluation of DOE at the behest of Dr. Jonni Sanger  History of Present Illness:    Margaret White is a 71 y.o. female with a hx of HTN, HLD, NAFLD, Aortic atherosclerosis, DM, OSA on mouthguard.  Formerly Dr. Bettina Gavia 2019.  Brother had AAA with no dissection.   Patient notes that she is feeling ok.   She has been going to the gym.  She had a tear in her knee cap and is pending MRI and pending surgery.   Was lifting weights, but has arthritis. Is still active and no cardiac limitations to her activity; save for getting a bit winded on her [place].  This is rare.  Has had no chest pain, chest pressure, chest tightness, chest stinging.      No shortness of breath.  No PND or orthopnea.  No weight gain, leg swelling , or abdominal swelling.  No syncope or near syncope . Notes  no palpitations or funny heart beats.      Past Medical History:  Diagnosis Date   Allergic rhinitis    Allergy    Anxiety    Asthma    Avascular necrosis of hip, right ([place]) 03/24/2018   Cataract    Chronic cluster headache    Chronic sinusitis    Diabetes mellitus type 2, diet-controlled ([place])    per pt currently no taking metformin, followed by pcp   Eczema    Essential hypertension    cardiologsit-- dr dr Bettina Gavia   Family history of abdominal aortic aneurysm (AAA) 07/03/2022   brother   Fatty liver    Fibromyalgia    GERD (gastroesophageal reflux disease)    Hepatic steatosis 10/14/2017   Hiatal hernia    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   Bilateral right worse than left that is mild in nature X-rays obtained in [place] on 07/12/2017 that are available through the [facility] PACS system   History of avascular necrosis of  capital femoral epiphysis    s/p  right THA   History of colon polyps    History of hyperthyroidism 2005   History of pneumonia, recurrent    History of seizure 2006   x1   IBS (irritable bowel syndrome)    IBS (irritable bowel syndrome)    Mixed hyperlipidemia    Nodule of lower lobe of left lung 10/2018   25m;   pulmologist-- dr iMeda Coffee bLeory Plowman stable per lov note in epic   Ocular rosacea    OSA (obstructive sleep apnea)    per pt intolerant to cpap due to sinus issues,  uses mouth guard    [place]steitis pubis [place] 11/05/2017   [facility]    Pneumonia    Primary osteoarthritis of both hands 07/10/2017   PTSD (post-traumatic stress disorder)    Recurrent major depressive disorder, in full remission ([place] 06/26/2017   RLS (restless legs syndrome)    Seasonal allergies    [place]izure [place] 2006   x 1   Sleep apnea    Solitary pulmonary nodule    pulmologist-- dr iMeda Coffee bLeory Plowman, lov note in epic,  stable   Spondylosis of  lumbar region without myelopathy or radiculopathy 11/04/2017   MRI 11/01/2017: Severe L4-5 facet arthrosis on the right greater than left. Shallow disc bulging without significant neuroforaminal stenosis.   SUI (stress urinary incontinence, female)    Vitamin B 12 deficiency    Vitamin D deficiency     Past Surgical History:  Procedure Laterality Date   ABDOMINOPLASTY  2006   ANAL FISSURE REPAIR  2006   BIOPSY  10/27/2020   Procedure: BIOPSY;  Surgeon: Rush Landmark Telford Nab., MD;  Location: [facility];  Service: Gastroenterology;;   BLADDER SURGERY  1984   BREAST BIOPSY Right     benign     CARPAL TUNNEL RELEASE Bilateral 1989   CATARACT EXTRACTION W/ INTRAOCULAR LENS  IMPLANT, BILATERAL  2017   COLONOSCOPY     COLONOSCOPY W/ POLYPECTOMY  2017   ECTOPIC PREGNANCY SURGERY  1979   ESOPHAGOGASTRODUODENOSCOPY (EGD) WITH PROPOFOL N/A 10/27/2020   Procedure: ESOPHAGOGASTRODUODENOSCOPY (EGD) WITH  PROPOFOL;  Surgeon: Irving Copas., MD;  Location: [facility];  Service: Gastroenterology;  Laterality: N/A;   EYE SURGERY  child    x4  1957-1960   INCONTINENCE SURGERY     NASAL SEPTUM SURGERY  1987   PUBOVAGINAL SLING N/A 03/24/2019   Procedure: CYSTOSCOPY  Gaynelle Arabian;  Surgeon: Bjorn Loser, MD;  Location: [facility];  Service: Urology;  Laterality: N/A;   REVISION TOTAL HIP ARTHROPLASTY Right    RHINOPLASTY  2007   TOTAL ABDOMINAL HYSTERECTOMY W/ BILATERAL SALPINGOOPHORECTOMY  1984   w/ Marshall-Marchetti (bladder tack suspension)   TOTAL HIP ARTHROPLASTY Right 04/11/2018   Procedure: RIGHT TOTAL HIP ARTHROPLASTY ANTERIOR APPROACH;  Surgeon: Mcarthur Rossetti, MD;  Location: [facility];  Service: Orthopedics;  Laterality: Right;   ULNAR NERVE TRANSPOSITION Left 1990s   UPPER ESOPHAGEAL ENDOSCOPIC ULTRASOUND (EUS) N/A 10/27/2020   Procedure: UPPER ESOPHAGEAL ENDOSCOPIC ULTRASOUND (EUS);  Surgeon: Irving Copas., MD;  Location: [facility];  Service: Gastroenterology;  Laterality: N/A;    Current Medications: Current Meds  Medication Sig   amLODipine (NORVASC) 5 MG tablet TAKE 1 TABLET DAILY   calcium carbonate (OS-CAL) 600 MG TABS tablet Take 600 mg by mouth daily with breakfast.   cetirizine (ZYRTEC) 10 MG tablet Take 10 mg by mouth daily.   DULoxetine (CYMBALTA) 60 MG capsule TAKE 1 CAPSULE DAILY (REPLACING 30 MG PRESCRIPTION)   esomeprazole (NEXIUM) 40 MG capsule Take 1 capsule (40 mg total) by mouth 2 (two) times daily before a meal.   ezetimibe (ZETIA) 10 MG tablet Take 1 tablet (10 mg total) by mouth daily.   FLUoxetine (PROZAC) 10 MG tablet TAKE 1 TABLET DAILY   fluticasone (FLONASE) 50 MCG/ACT nasal spray USE 1 SPRAY IN EACH NOSTRIL DAILY AS NEEDED FOR ALLERGIES   gabapentin (NEURONTIN) 300 MG capsule TAKE 1 CAPSULE THREE TIMES A DAY   Polyvinyl Alcohol-Povidone PF (REFRESH) 1.4-0.6 % SOLN Place 1 drop into both eyes daily  as needed (Dry eye).     Allergies:   Codeine and Sulfa antibiotics   Social History   Socioeconomic History   Marital status: Married    Spouse name: Not on file   Number of children: 4   Years of education: Not on file   Highest education level: Not on file  Occupational History   Occupation: TEACHER    Employer: [employer]: retired  Tobacco Use   Smoking status: Never   Smokeless tobacco: Never  Vaping Use   Vaping Use:  Never used  Substance and Sexual Activity   Alcohol use: No   Drug use: Never   Sexual activity: Yes    Birth control/protection: Surgical  Other Topics Concern   Not on file  Social History Narrative   Not on file   Social Determinants of Health   Financial Resource Strain: Low Risk  (02/16/2022)   Overall Financial Resource Strain (CARDIA)    Difficulty of Paying Living Expenses: Not hard at all  Food Insecurity: No Food Insecurity (02/16/2022)   Hunger Vital Sign    Worried About Running Out of Food in the Last Year: Never true    Ran Out of Food in the Last Year: Never true  Transportation Needs: No Transportation Needs (02/16/2022)   PRAPARE - Hydrologist (Medical): No    Lack of Transportation (Non-Medical): No  Physical Activity: Sufficiently Active (02/16/2022)   Exercise Vital Sign    Days of Exercise per Week: 5 days    Minutes of Exercise per Session: 90 min  Stress: No Stress Concern Present (02/16/2022)   [place]    Feeling of Stress : Not at all  Social Connections: [place] (02/16/2022)   Social Connection and Isolation Panel [NHANES]    Frequency of Communication with Friends and Family: Once a week    Frequency of Social Gatherings with Friends and Family: More than three times a week    Attends Religious Services: More than 4 times per year    Active Member of [employer] or Organizations: Yes    Attends  Archivist Meetings: 1 to 4 times per year    Marital Status: Married     Family History: The patient's family history includes Alcoholism in her paternal grandfather; CAD in her maternal grandfather; Cancer in her paternal grandmother; Congestive Heart Failure in her mother; Emphysema in her maternal grandmother and sister; Heart disease in her brother, father, maternal grandfather, and maternal grandmother; Hypertension in her mother; Multiple myeloma in her mother; Other in her brother; Prostate cancer in her father. There is no history of Colon cancer, Esophageal cancer, Stomach cancer, or Rectal cancer.  ROS:   Please see the history of present illness.     All other systems reviewed and are negative.  EKGs/Labs/Other Studies Reviewed:    The following studies were reviewed today:  EKG:  EKG is  ordered today.  The ekg ordered today demonstrates  07/03/22: SR with TWI 08/27/22: SR with T wave flattening  NonCardiac CT: Date: 2021 Results: PN; Aortic atherosclerosis minimal, no CAC   Recent Labs: 04/09/2022: ALT 30; BUN 14; Creatinine, Ser 0.99; Potassium 3.9; Sodium 139 07/03/2022: Hemoglobin 13.7; Platelets 303.0; Pro B Natriuretic peptide (BNP) 18.0; TSH 3.09  Recent Lipid Panel    Component Value Date/Time   CHOL 206 (H) 02/27/2022 1210   CHOL 210 (H) 08/10/2021 1122   TRIG 243.0 (H) 02/27/2022 1210   TRIG 170 (H) 08/10/2021 1122   HDL 49.00 02/27/2022 1210   CHOLHDL 4 02/27/2022 1210   VLDL 48.6 (H) 02/27/2022 1210   LDLCALC 104 (H) 02/08/2020 1546   LDLDIRECT 113.0 02/27/2022 1210    Physical Exam:    VS:  BP 128/80   Pulse 65   Ht 5' 3.5" (1.613 m)   Wt 165 lb (74.8 kg)   SpO2 96%   BMI 28.77 kg/m     Wt Readings from Last 3 Encounters:  08/27/22 165 lb (  74.8 kg)  07/03/22 162 lb 9.6 oz (73.8 kg)  04/09/22 155 lb 10.3 oz (70.6 kg)    GEN:  Well nourished, well developed in no acute distress HEENT: Normal NECK: No JVD LYMPHATICS: No  lymphadenopathy CARDIAC: RRR, systolic crescendo murmur,  no rubs, gallops RESPIRATORY:  Clear to auscultation without rales, wheezing or rhonchi  ABDOMEN: Soft, non-tender, non-distended MUSCULOSKELETAL:  No edema; No deformity  SKIN: Warm and dry NEUROLOGIC:  Alert and oriented x 3 PSYCHIATRIC:  Normal affect   ASSESSMENT:    1. DOE (dyspnea on exertion)   2. Heart murmur, systolic   3. Family history of abdominal aortic aneurysm (AAA)   4. Hyperlipidemia associated with type 2 diabetes mellitus ([place])   5. Essential hypertension   6. OSA (obstructive sleep apnea)   7. NASH (nonalcoholic steatohepatitis)    PLAN:    DOE with potential upcoming surgery and OSH TWI Systolic heart murmur (suspect aortic sclerosis) - will get echo - consented for stress test (Exercise NM stress)  Hyperlipidemia (HLD with DM) Aortic atherosclerosis NAFLD Statin myalgia -LDL goal less than 70 -continue Zetia - based on lipids from tomorrow, may recommend pravastatin as a statin re-challenge  Essential Hypertension OSA on mouth guard - controlled on current therapy  HX of AAA (brother) - reviewed Chest CT with patient - one time screen  Six months me or APP unless positive testing     Medication Adjustments/Labs and Tests Ordered: Current medicines are reviewed at length with the patient today.  Concerns regarding medicines are outlined above.  Orders Placed This Encounter  Procedures   Cardiac Stress Test: Informed Consent Details: Physician/Practitioner Attestation; Transcribe to consent form and obtain patient signature   MYOCARDIAL PERFUSION IMAGING   EKG 12-Lead   ECHOCARDIOGRAM COMPLETE   VAS [place] AAA DUPLEX   No orders of the defined types were placed in this encounter.   Patient Instructions  Medication Instructions:  Your physician recommends that you continue on your current medications as directed. Please refer to the Current Medication list given to you today.  *If  you need a refill on your cardiac medications before your next appointment, please call your pharmacy*   Lab Work: NONE If you have labs (blood work) drawn today and your tests are completely normal, you will receive your results only by: [place] (if you have MyChart) OR A paper copy in the mail If you have any lab test that is abnormal or we need to change your treatment, we will call you to review the results.   Testing/Procedures: Your physician has requested that you have an echocardiogram. Echocardiography is a painless test that uses sound waves to create images of your heart. It provides your doctor with information about the size and shape of your heart and how well your heart's chambers and valves are working. This procedure takes approximately one hour. There are no restrictions for this procedure. Please do NOT wear cologne, perfume, aftershave, or lotions (deodorant is allowed). Please arrive 15 minutes prior to your appointment time.  Your physician has requested that you have an abdominal aorta duplex. During this test, an ultrasound is used to evaluate the aorta. Allow 30 minutes for this exam. Do not eat after midnight the day before and avoid carbonated beverages   Your physician has requested that you have en exercise stress myoview. For further information please visit [email or web address]. Please follow instruction sheet, as given.    You are scheduled for a Myocardial Perfusion Imaging Study.  Please arrive 15 minutes prior to your appointment time for registration and insurance purposes.   The test will take approximately 3 to 4 hours to complete; you may bring reading material.  If someone comes with you to your appointment, they will need to remain in the main lobby due to limited space in the testing area.    How to prepare for your Myocardial Perfusion Test: Do not eat or drink 3 hours prior to your test, except you may have water. Do not consume products  containing caffeine (regular or decaffeinated) 12 hours prior to your test. (ex: coffee, chocolate, sodas, tea). Do bring a list of your current medications with you.  If not listed below, you may take your medications as normal. Do wear comfortable clothes (no dresses or overalls) and walking shoes, tennis shoes preferred (No heels or open toe shoes are allowed). Do NOT wear cologne, perfume, aftershave, or lotions (deodorant is allowed). If these instructions are not followed, your test will have to be rescheduled.  If you cannot keep your appointment, please provide 24 hours notification to the Nuclear Lab, to avoid a possible $50 charge to your account.       Follow-Up: At [facility], you and your health needs are our priority.  As part of our continuing mission to provide you with exceptional heart care, we have created designated Provider Care Teams.  These Care Teams include your primary Cardiologist (physician) and Advanced Practice Providers (APPs -  Physician Assistants and Nurse Practitioners) who all work together to provide you with the care you need, when you need it.  Your next appointment:   6 month(s)  The format for your next appointment:   In Person  Provider:   Werner Lean, MD     Other Instructions Please have labs from Primary care office sent to our office Fax Number [phone]  Important Information About Sugar         Signed, Werner Lean, MD  08/27/2022 10:05 AM    [place]

## 2022-08-27 NOTE — Patient Instructions (Addendum)
Medication Instructions:  Your physician recommends that you continue on your current medications as directed. Please refer to the Current Medication list given to you today.  *If you need a refill on your cardiac medications before your next appointment, please call your pharmacy*   Lab Work: NONE If you have labs (blood work) drawn today and your tests are completely normal, you will receive your results only by: [place] (if you have MyChart) OR A paper copy in the mail If you have any lab test that is abnormal or we need to change your treatment, we will call you to review the results.   Testing/Procedures: Your physician has requested that you have an echocardiogram. Echocardiography is a painless test that uses sound waves to create images of your heart. It provides your doctor with information about the size and shape of your heart and how well your heart's chambers and valves are working. This procedure takes approximately one hour. There are no restrictions for this procedure. Please do NOT wear cologne, perfume, aftershave, or lotions (deodorant is allowed). Please arrive 15 minutes prior to your appointment time.  Your physician has requested that you have an abdominal aorta duplex. During this test, an ultrasound is used to evaluate the aorta. Allow 30 minutes for this exam. Do not eat after midnight the day before and avoid carbonated beverages   Your physician has requested that you have en exercise stress myoview. For further information please visit [email or web address]. Please follow instruction sheet, as given.    You are scheduled for a Myocardial Perfusion Imaging Study. Please arrive 15 minutes prior to your appointment time for registration and insurance purposes.   The test will take approximately 3 to 4 hours to complete; you may bring reading material.  If someone comes with you to your appointment, they will need to remain in the main lobby due to limited  space in the testing area.    How to prepare for your Myocardial Perfusion Test: Do not eat or drink 3 hours prior to your test, except you may have water. Do not consume products containing caffeine (regular or decaffeinated) 12 hours prior to your test. (ex: coffee, chocolate, sodas, tea). Do bring a list of your current medications with you.  If not listed below, you may take your medications as normal. Do wear comfortable clothes (no dresses or overalls) and walking shoes, tennis shoes preferred (No heels or open toe shoes are allowed). Do NOT wear cologne, perfume, aftershave, or lotions (deodorant is allowed). If these instructions are not followed, your test will have to be rescheduled.  If you cannot keep your appointment, please provide 24 hours notification to the Nuclear Lab, to avoid a possible $50 charge to your account.       Follow-Up: At [facility], you and your health needs are our priority.  As part of our continuing mission to provide you with exceptional heart care, we have created designated Provider Care Teams.  These Care Teams include your primary Cardiologist (physician) and Advanced Practice Providers (APPs -  Physician Assistants and Nurse Practitioners) who all work together to provide you with the care you need, when you need it.  Your next appointment:   6 month(s)  The format for your next appointment:   In Person  Provider:   Werner Lean, MD     Other Instructions Please have labs from Primary care office sent to our office Fax Number [phone]  Important Information About Sugar

## 2022-08-28 ENCOUNTER — Ambulatory Visit (INDEPENDENT_AMBULATORY_CARE_PROVIDER_SITE_OTHER): Payer: Medicare Other | Admitting: Family Medicine

## 2022-08-28 ENCOUNTER — Encounter: Payer: Self-pay | Admitting: Family Medicine

## 2022-08-28 VITALS — BP 126/78 | HR 70 | Temp 98.8°F | Ht 63.5 in | Wt 166.4 lb

## 2022-08-28 DIAGNOSIS — R0609 Other forms of dyspnea: Secondary | ICD-10-CM | POA: Diagnosis not present

## 2022-08-28 DIAGNOSIS — E782 Mixed hyperlipidemia: Secondary | ICD-10-CM | POA: Diagnosis not present

## 2022-08-28 DIAGNOSIS — K219 Gastro-esophageal reflux disease without esophagitis: Secondary | ICD-10-CM

## 2022-08-28 DIAGNOSIS — Z789 Other specified health status: Secondary | ICD-10-CM | POA: Diagnosis not present

## 2022-08-28 DIAGNOSIS — I10 Essential (primary) hypertension: Secondary | ICD-10-CM | POA: Diagnosis not present

## 2022-08-28 DIAGNOSIS — R7303 Prediabetes: Secondary | ICD-10-CM

## 2022-08-28 LAB — COMPREHENSIVE METABOLIC PANEL
ALT: 24 U/L (ref 0–35)
AST: 24 U/L (ref 0–37)
Albumin: 4.3 g/dL (ref 3.5–5.2)
Alkaline Phosphatase: 70 U/L (ref 39–117)
BUN: 13 mg/dL (ref 6–23)
CO2: 27 mEq/L (ref 19–32)
Calcium: 9.3 mg/dL (ref 8.4–10.5)
Chloride: 105 mEq/L (ref 96–112)
Creatinine, Ser: 0.95 mg/dL (ref 0.40–1.20)
GFR: 60.93 mL/min (ref >60.00)
Glucose, Bld: 102 mg/dL — ABNORMAL HIGH (ref 70–99)
Potassium: 3.9 mEq/L (ref 3.5–5.1)
Sodium: 139 mEq/L (ref 135–145)
Total Bilirubin: 0.6 mg/dL (ref 0.2–1.2)
Total Protein: 7 g/dL (ref 6.0–8.3)

## 2022-08-28 LAB — POCT GLYCOSYLATED HEMOGLOBIN (HGB A1C): Hemoglobin A1C: 6 % — AB (ref 4.0–5.6)

## 2022-08-28 LAB — LIPID PANEL
Cholesterol: 196 mg/dL (ref 0–200)
HDL: 52.4 mg/dL (ref >39.00)
LDL Cholesterol: 103 mg/dL — ABNORMAL HIGH (ref 0–99)
NonHDL: 143.23
Total CHOL/HDL Ratio: 4
Triglycerides: 199 mg/dL — ABNORMAL HIGH (ref 0.0–149.0)
VLDL: 39.8 mg/dL (ref 0.0–40.0)

## 2022-08-28 MED ORDER — ESOMEPRAZOLE MAGNESIUM 40 MG PO CPDR: 40.0000 mg | DELAYED_RELEASE_CAPSULE | Freq: Two times a day (BID) | ORAL | 3 refills | Status: DC

## 2022-08-28 NOTE — Progress Notes (Signed)
Subjective  CC:  Chief Complaint  Patient presents with   Hypertension    HPI: Margaret White is a 71 y.o. female who presents to the office today to address the problems listed above in the chief complaint. Hypertension f/u: Control is good . Pt reports she is doing well. taking medications as instructed, no medication side effects noted, no TIAs, no chest pain on exertion, no dyspnea on exertion, no swelling of ankles. Reviewed recent cardiology f/u, no bp med changes indicated. She denies adverse effects from his BP medications. Compliance with medication is good.  DOE: rare event. Reviewed recent eval and work up.  GERD stable on high dose nexium HLD: due for recheck. Cards would like to rechallenge with pravastatin if indicated. H/o myalgias with statin. Prediabetes: last A1c 6.4; she is working on low sugar diet. Has a sweet tooth. Would like to exercise more however has a knee problem that is a barrier right now. Reviewed ortho notes.   Assessment  1. Essential hypertension   2. Prediabetes   3. Mixed hyperlipidemia   4. Statin intolerance   5. DOE (dyspnea on exertion)   6. Gastroesophageal reflux disease without esophagitis      Plan   Hypertension f/u: BP control is well controlled. Continue meds Recheck lipids Prediabetes: improving. Educated on diet changes. See AVS. Recheck 6 months GERD nexium refilled.   Education regarding management of these chronic disease states was given. Management strategies discussed on successive visits include dietary and exercise recommendations, goals of achieving and maintaining IBW, and lifestyle modifications aiming for adequate sleep and minimizing stressors.   Follow up: [facility] cpe  Orders Placed This Encounter  Procedures   Lipid panel   Comprehensive metabolic panel   POCT HgB A1C   Meds ordered this encounter  Medications   esomeprazole (NEXIUM) 40 MG capsule    Sig: Take 1 capsule (40 mg total) by mouth 2 (two)  times daily before a meal.    Dispense:  180 capsule    Refill:  3      BP Readings from Last 3 Encounters:  08/28/22 126/78  08/27/22 128/80  07/03/22 115/75   Wt Readings from Last 3 Encounters:  08/28/22 166 lb 6.4 oz (75.5 kg)  08/27/22 165 lb (74.8 kg)  07/03/22 162 lb 9.6 oz (73.8 kg)    Lab Results  Component Value Date   CHOL 196 08/28/2022   CHOL 206 (H) 02/27/2022   CHOL 210 (H) 08/10/2021   Lab Results  Component Value Date   HDL 52.40 08/28/2022   HDL 49.00 02/27/2022   HDL 48.30 02/09/2021   Lab Results  Component Value Date   LDLCALC 103 (H) 08/28/2022   LDLCALC 104 (H) 02/08/2020   LDLCALC 108 (H) 07/30/2018   Lab Results  Component Value Date   TRIG 199.0 (H) 08/28/2022   TRIG 243.0 (H) 02/27/2022   TRIG 170 (H) 08/10/2021   Lab Results  Component Value Date   CHOLHDL 4 08/28/2022   CHOLHDL 4 02/27/2022   CHOLHDL 4 02/09/2021   Lab Results  Component Value Date   LDLDIRECT 113.0 02/27/2022   LDLDIRECT 111.0 02/09/2021   LDLDIRECT 125.0 01/22/2019   Lab Results  Component Value Date   CREATININE 0.95 08/28/2022   BUN 13 08/28/2022   NA 139 08/28/2022   K 3.9 08/28/2022   CL 105 08/28/2022   CO2 27 08/28/2022    The 10-year ASCVD risk score (Arnett DK, et al., 2019)  is: 20.3%   Values used to calculate the score:     Age: 53 years     Sex: Female     Is Non-Hispanic African American: No     Diabetic: Yes     Tobacco smoker: No     Systolic Blood Pressure: 710 mmHg     Is BP treated: Yes     HDL Cholesterol: 52.4 mg/dL     Total Cholesterol: 196 mg/dL  I reviewed the patients updated PMH, FH, and SocHx.    Patient Active Problem List   Diagnosis Date Noted   Prediabetes 02/09/2021    Priority: High   Tubular adenoma of colon 08/19/2019    Priority: High   Statin intolerance 03/25/2018    Priority: High   GERD (gastroesophageal reflux disease) 10/14/2017    Priority: High   NASH (nonalcoholic steatohepatitis)  10/14/2017    Priority: High   OSA (obstructive sleep apnea) 10/14/2017    Priority: High   Fibromyalgia 10/14/2017    Priority: High   Obesity (BMI 30-39.9) 10/14/2017    Priority: High   Mixed hyperlipidemia 10/14/2017    Priority: High   Essential hypertension 06/26/2017    Priority: High   Incontinence without sensory awareness 05/08/2018    Priority: Medium    Mixed incontinence 05/08/2018    Priority: Medium    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    Priority: Medium    Bilateral primary osteoarthritis of hip 11/05/2017    Priority: Medium    Spondylosis of lumbar region without myelopathy or radiculopathy 11/04/2017    Priority: Medium    IBS (irritable bowel syndrome) 10/14/2017    Priority: Medium    Recurrent major depressive disorder, in full remission ([place]) 06/26/2017    Priority: Medium    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    Priority: Low   Allergic rhinitis 10/14/2017    Priority: Low   History of cluster headache 10/14/2017    Priority: Low   Chronic maxillary sinusitis 10/14/2017    Priority: Low   Vitamin B12 deficiency 10/14/2017    Priority: Low   Vitamin D deficiency 10/14/2017    Priority: Low   Female cystocele 10/14/2017    Priority: Low   Primary osteoarthritis of both hands 07/10/2017    Priority: Low   Heart murmur, systolic 62/69/4854   Family history of abdominal aortic aneurysm (AAA) 07/03/2022   Periodic limb movement sleep disorder 08/15/2021   DOE (dyspnea on exertion) 05/14/2018    Allergies: Codeine and Sulfa antibiotics  Social History: Patient  reports that she has never smoked. She has never used smokeless tobacco. She reports that she does not drink alcohol and does not use drugs.  Current Meds  Medication Sig   amLODipine (NORVASC) 5 MG tablet TAKE 1 TABLET DAILY   calcium carbonate (OS-CAL) 600 MG TABS tablet Take 600 mg by mouth daily with breakfast.   cetirizine (ZYRTEC) 10 MG tablet Take 10 mg by mouth  daily.   DULoxetine (CYMBALTA) 60 MG capsule TAKE 1 CAPSULE DAILY (REPLACING 30 MG PRESCRIPTION)   ezetimibe (ZETIA) 10 MG tablet Take 1 tablet (10 mg total) by mouth daily.   FLUoxetine (PROZAC) 10 MG tablet TAKE 1 TABLET DAILY   fluticasone (FLONASE) 50 MCG/ACT nasal spray USE 1 SPRAY IN EACH NOSTRIL DAILY AS NEEDED FOR ALLERGIES   gabapentin (NEURONTIN) 300 MG capsule TAKE 1 CAPSULE THREE TIMES A DAY   Polyvinyl Alcohol-Povidone PF (REFRESH) 1.4-0.6 % SOLN Place 1 drop  into both eyes daily as needed (Dry eye).   [DISCONTINUED] esomeprazole (NEXIUM) 40 MG capsule Take 1 capsule (40 mg total) by mouth 2 (two) times daily before a meal.    Review of Systems: Cardiovascular: negative for chest pain, palpitations, leg swelling, orthopnea Respiratory: negative for SOB, wheezing or persistent cough Gastrointestinal: negative for abdominal pain Genitourinary: negative for dysuria or gross hematuria  Objective  Vitals: BP 126/78 Comment: rechecked  Pulse 70   Temp 98.8 F (37.1 C)   Ht 5' 3.5" (1.613 m)   Wt 166 lb 6.4 oz (75.5 kg)   SpO2 96%   BMI 29.01 kg/m  General: no acute distress  Psych:  Alert and oriented, normal mood and affect HEENT:  Normocephalic, atraumatic, supple neck  Cardiovascular:  RRR without murmur. no edema Respiratory:  Good breath sounds bilaterally, CTAB with normal respiratory effort Neurologic:   Mental status is normal Commons side effects, risks, benefits, and alternatives for medications and treatment plan prescribed today were discussed, and the patient expressed understanding of the given instructions. Patient is instructed to call or message via MyChart if he/she has any questions or concerns regarding our treatment plan. No barriers to understanding were identified. We discussed Red Flag symptoms and signs in detail. Patient expressed understanding regarding what to do in case of urgent or emergency type symptoms.  Medication list was reconciled, printed  and provided to the patient in AVS. Patient instructions and summary information was reviewed with the patient as documented in the AVS. This note was prepared with assistance of Dragon voice recognition software. Occasional wrong-word or sound-a-like substitutions may have occurred due to the inherent limitation

## 2022-08-28 NOTE — Patient Instructions (Signed)
Please return in 6 months for your annual complete physical; please come fasting.   If you have any questions or concerns, please don't hesitate to send me a message via MyChart or call the office at [phone]. Thank you for visiting with Margaret White today! It's our pleasure caring for you.   Diabetes Mellitus and Nutrition, Adult When you have diabetes, or diabetes mellitus, it is very important to have healthy eating habits because your blood sugar (glucose) levels are greatly affected by what you eat and drink. Eating healthy foods in the right amounts, at about the same times every day, can help you: Manage your blood glucose. Lower your risk of heart disease. Improve your blood pressure. Reach or maintain a healthy weight. What can affect my meal plan? Every person with diabetes is different, and each person has different needs for a meal plan. Your health care provider may recommend that you work with a dietitian to make a meal plan that is best for you. Your meal plan may vary depending on factors such as: The calories you need. The medicines you take. Your weight. Your blood glucose, blood pressure, and cholesterol levels. Your activity level. Other health conditions you have, such as heart or kidney disease. How do carbohydrates affect me? Carbohydrates, also called carbs, affect your blood glucose level more than any other type of food. Eating carbs raises the amount of glucose in your blood. It is important to know how many carbs you can safely have in each meal. This is different for every person. Your dietitian can help you calculate how many carbs you should have at each meal and for each snack. How does alcohol affect me? Alcohol can cause a decrease in blood glucose (hypoglycemia), especially if you use insulin or take certain diabetes medicines by mouth. Hypoglycemia can be a life-threatening condition. Symptoms of hypoglycemia, such as sleepiness, dizziness, and confusion, are  similar to symptoms of having too much alcohol. Do not drink alcohol if: Your health care provider tells you not to drink. You are pregnant, may be pregnant, or are planning to become pregnant. If you drink alcohol: Limit how much you have to: 0-1 drink a day for women. 0-2 drinks a day for men. Know how much alcohol is in your drink. In the [place] one drink equals one 12 oz bottle of beer (355 mL), one 5 oz glass of wine (148 mL), or one 1 oz glass of hard liquor (44 mL). Keep yourself hydrated with water, diet soda, or unsweetened iced tea. Keep in mind that regular soda, juice, and other mixers may contain a lot of sugar and must be counted as carbs. What are tips for following this plan?  Reading food labels Start by checking the serving size on the Nutrition Facts label of packaged foods and drinks. The number of calories and the amount of carbs, fats, and other nutrients listed on the label are based on one serving of the item. Many items contain more than one serving per package. Check the total grams (g) of carbs in one serving. Check the number of grams of saturated fats and trans fats in one serving. Choose foods that have a low amount or none of these fats. Check the number of milligrams (mg) of salt (sodium) in one serving. Most people should limit total sodium intake to less than 2,300 mg per day. Always check the nutrition information of foods labeled as "low-fat" or "nonfat." These foods may be higher in added sugar or refined  carbs and should be avoided. Talk to your dietitian to identify your daily goals for nutrients listed on the label. Shopping Avoid buying canned, pre-made, or processed foods. These foods tend to be high in fat, sodium, and added sugar. Shop around the outside edge of the grocery store. This is where you will most often find fresh fruits and vegetables, bulk grains, fresh meats, and fresh dairy products. Cooking Use low-heat cooking methods, such as  baking, instead of high-heat cooking methods, such as deep frying. Cook using healthy oils, such as olive, canola, or sunflower oil. Avoid cooking with butter, cream, or high-fat meats. Meal planning Eat meals and snacks regularly, preferably at the same times every day. Avoid going long periods of time without eating. Eat foods that are high in fiber, such as fresh fruits, vegetables, beans, and whole grains. Eat 4-6 oz (112-168 g) of lean protein each day, such as lean meat, chicken, fish, eggs, or tofu. One ounce (oz) (28 g) of lean protein is equal to: 1 oz (28 g) of meat, chicken, or fish. 1 egg.  cup (62 g) of tofu. Eat some foods each day that contain healthy fats, such as avocado, nuts, seeds, and fish. What foods should I eat? Fruits Berries. Apples. Oranges. Peaches. Apricots. Plums. Grapes. Mangoes. Papayas. Pomegranates. Kiwi. Cherries. Vegetables Leafy greens, including lettuce, spinach, kale, chard, collard greens, mustard greens, and cabbage. Beets. Cauliflower. Broccoli. Carrots. Green beans. Tomatoes. Peppers. Onions. Cucumbers. Brussels sprouts. Grains Whole grains, such as whole-wheat or whole-grain bread, crackers, tortillas, cereal, and pasta. Unsweetened oatmeal. Quinoa. Brown or wild rice. Meats and other proteins Seafood. Poultry without skin. Lean cuts of poultry and beef. Tofu. Nuts. Seeds. Dairy Low-fat or fat-free dairy products such as milk, yogurt, and cheese. The items listed above may not be a complete list of foods and beverages you can eat and drink. Contact a dietitian for more information. What foods should I avoid? Fruits Fruits canned with syrup. Vegetables Canned vegetables. Frozen vegetables with butter or cream sauce. Grains Refined white flour and flour products such as bread, pasta, snack foods, and cereals. Avoid all processed foods. Meats and other proteins Fatty cuts of meat. Poultry with skin. Breaded or fried meats. Processed meat. Avoid  saturated fats. Dairy Full-fat yogurt, cheese, or milk. Beverages Sweetened drinks, such as soda or iced tea. The items listed above may not be a complete list of foods and beverages you should avoid. Contact a dietitian for more information. Questions to ask a health care provider Do I need to meet with a certified diabetes care and education specialist? Do I need to meet with a dietitian? What number can I call if I have questions? When are the best times to check my blood glucose? Where to find more information: American Diabetes Association: diabetes.org Academy of Nutrition and Dietetics: eatright.[employer] of Diabetes and Digestive and Kidney Diseases: [employer] Association of Diabetes Care & Education Specialists: diabeteseducator.org Summary It is important to have healthy eating habits because your blood sugar (glucose) levels are greatly affected by what you eat and drink. It is important to use alcohol carefully. A healthy meal plan will help you manage your blood glucose and lower your risk of heart disease. Your health care provider may recommend that you work with a dietitian to make a meal plan that is best for you. This information is not intended to replace advice given to you by your health care provider. Make sure you discuss any questions you have with  your health care provider. Document Revised: 04/27/2020 Document Reviewed: 04/27/2020 Elsevier Patient Education  [place].

## 2022-09-03 ENCOUNTER — Ambulatory Visit
Admission: RE | Admit: 2022-09-03 | Discharge: 2022-09-03 | Disposition: A | Discharge status: Home / Self Care | Payer: Medicare Other | Source: Ambulatory Visit | Attending: Family Medicine | Admitting: Family Medicine

## 2022-09-03 DIAGNOSIS — Z1231 Encounter for screening mammogram for malignant neoplasm of breast: Secondary | ICD-10-CM

## 2022-09-04 ENCOUNTER — Ambulatory Visit
Admission: RE | Admit: 2022-09-04 | Discharge: 2022-09-04 | Disposition: A | Discharge status: Home / Self Care | Payer: Medicare Other | Source: Ambulatory Visit | Attending: Orthopaedic Surgery | Admitting: Orthopaedic Surgery

## 2022-09-04 DIAGNOSIS — M25461 Effusion, right knee: Secondary | ICD-10-CM | POA: Diagnosis not present

## 2022-09-04 DIAGNOSIS — G8929 Other chronic pain: Secondary | ICD-10-CM

## 2022-09-04 DIAGNOSIS — M1711 Unilateral primary osteoarthritis, right knee: Secondary | ICD-10-CM | POA: Diagnosis not present

## 2022-09-06 ENCOUNTER — Encounter: Payer: Self-pay | Admitting: Physician Assistant

## 2022-09-06 ENCOUNTER — Ambulatory Visit (INDEPENDENT_AMBULATORY_CARE_PROVIDER_SITE_OTHER): Payer: Medicare Other | Admitting: Physician Assistant

## 2022-09-06 DIAGNOSIS — S83281D Other tear of lateral meniscus, current injury, right knee, subsequent encounter: Secondary | ICD-10-CM

## 2022-09-06 NOTE — Progress Notes (Signed)
HPI: Mrs. Ahmed comes in today to go over the MRI of her right knee.  She states since she is modified her activities is not going to the gym and that her right knee pain is better she still has mechanical symptoms of the knee giving way, locking and catching.  States pain is worse with steps and uneven surfaces.  Notes pain comes and goes.  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  MRI right knee images reviewed with the patient.  MRI shows a high-grade lateral meniscal tear involving the anterior body of the lateral meniscus and extends through the mid to inferior aspect of the lateral wall of the meniscal triangle.  Moderate extrusion of the anterior segment of the body of the meniscus with degenerative fraying of the superior aspect of the peripheral third of the meniscal triangle.  Full-thickness cartilage loss within the weightbearing portion of the femoral condyle which was measured at 6 mm in AP dimension and 3 mm in transverse dimensions.  Mild joint effusion.  Physical exam: Tenderness lateral aspect of the right knee.  She has full extension and flexes to just beyond 90 with some discomfort.  Impression: Left knee lateral meniscal tear  Plan: At this point time she feels she needs to follow-up with cardiology undergo that work-up prior to scheduling any surgery for her right knee.  Right knee surgery with be a knee arthroscopy with lateral partial meniscectomy.  She understands that the arthritic changes would remain status post arthroscopy.  Postoperative protocol discussed with patient.  Questions were encouraged and answered risk benefits discussed.  She will call and let us knowwhen and if she wants to schedule surgery

## 2022-09-11 ENCOUNTER — Ambulatory Visit (HOSPITAL_COMMUNITY)
Admission: RE | Admit: 2022-09-11 | Discharge: 2022-09-11 | Disposition: A | Discharge status: Home / Self Care | Payer: Medicare Other | Source: Ambulatory Visit | Attending: Internal Medicine | Admitting: Internal Medicine

## 2022-09-11 DIAGNOSIS — R0609 Other forms of dyspnea: Secondary | ICD-10-CM | POA: Insufficient documentation

## 2022-09-11 DIAGNOSIS — Z8249 Family history of ischemic heart disease and other diseases of the circulatory system: Secondary | ICD-10-CM | POA: Insufficient documentation

## 2022-09-17 ENCOUNTER — Telehealth (HOSPITAL_COMMUNITY): Payer: Self-pay | Admitting: *Deleted

## 2022-09-17 NOTE — Telephone Encounter (Signed)
Patient given detailed instructions per Myocardial Perfusion Study Information Sheet for the test on 09/19/22 Patient notified to arrive 15 minutes early and that it is imperative to arrive on time for appointment to keep from having the test rescheduled.  If you need to cancel or reschedule your appointment, please call the office within 24 hours of your appointment. . Patient verbalized understanding.Kirstie Peri

## 2022-09-19 ENCOUNTER — Encounter: Payer: Self-pay | Admitting: Internal Medicine

## 2022-09-19 ENCOUNTER — Ambulatory Visit (HOSPITAL_COMMUNITY): Payer: Medicare Other | Attending: Internal Medicine

## 2022-09-19 ENCOUNTER — Ambulatory Visit (HOSPITAL_BASED_OUTPATIENT_CLINIC_OR_DEPARTMENT_OTHER): Payer: Medicare Other

## 2022-09-19 DIAGNOSIS — R011 Cardiac murmur, unspecified: Secondary | ICD-10-CM

## 2022-09-19 DIAGNOSIS — R0609 Other forms of dyspnea: Secondary | ICD-10-CM

## 2022-09-19 DIAGNOSIS — I10 Essential (primary) hypertension: Secondary | ICD-10-CM | POA: Diagnosis not present

## 2022-09-19 DIAGNOSIS — Z8249 Family history of ischemic heart disease and other diseases of the circulatory system: Secondary | ICD-10-CM

## 2022-09-19 DIAGNOSIS — E119 Type 2 diabetes mellitus without complications: Secondary | ICD-10-CM | POA: Insufficient documentation

## 2022-09-19 LAB — ECHOCARDIOGRAM COMPLETE
Area-P 1/2: 3.17 cm2
Height: 63.5 in
S' Lateral: 2.9 cm
Weight: 2656 oz

## 2022-09-19 LAB — MYOCARDIAL PERFUSION IMAGING
LV dias vol: 54 mL (ref 46–106)
LV sys vol: 22 mL
Nuc Stress EF: 59 %
Peak HR: 104 {beats}/min
Rest HR: 70 {beats}/min
Rest Nuclear Isotope Dose: 9.8 mCi
SDS: 1
SRS: 0
SSS: 1
ST Depression (mm): 0 mm
Stress Nuclear Isotope Dose: 30.9 mCi
TID: 0.94

## 2022-09-19 MED ORDER — TECHNETIUM TC 99M TETROFOSMIN IV KIT
30.9000 | PACK | Freq: Once | INTRAVENOUS | Status: AC | PRN
  Administered 2022-09-19: 30.9 via INTRAVENOUS

## 2022-09-19 MED ORDER — REGADENOSON 0.4 MG/5ML IV SOLN
0.4000 mg | Freq: Once | INTRAVENOUS | Status: AC
  Administered 2022-09-19: 0.4 mg via INTRAVENOUS

## 2022-09-19 MED ORDER — TECHNETIUM TC 99M TETROFOSMIN IV KIT
9.8000 | PACK | Freq: Once | INTRAVENOUS | Status: AC | PRN
  Administered 2022-09-19: 9.8 via INTRAVENOUS

## 2022-09-25 DIAGNOSIS — L299 Pruritus, unspecified: Secondary | ICD-10-CM | POA: Diagnosis not present

## 2022-09-25 DIAGNOSIS — L853 Xerosis cutis: Secondary | ICD-10-CM | POA: Diagnosis not present

## 2022-09-28 ENCOUNTER — Telehealth: Payer: Self-pay | Admitting: *Deleted

## 2022-09-28 NOTE — Telephone Encounter (Signed)
     Primary Cardiologist: Werner Lean, MD  Chart reviewed as part of pre-operative protocol coverage. Given past medical history and time since last visit, based on ACC/AHA guidelines, Margaret White would be at acceptable risk for the planned procedure without further cardiovascular testing.   Her RCRI is a class II risk, 0.9% risk of major cardiac event.  She was recently seen and evaluated in the cardiology clinic and at that time was able to complete greater than 4 METS of activity.  I will route this recommendation to the requesting party via Epic fax function and remove from pre-op pool.  Please call with questions.  Jossie Ng. Khadir Roam NP-C     09/28/2022, 3:16 PM [place] [address] Office [phone] Fax [phone]

## 2022-09-28 NOTE — Telephone Encounter (Signed)
   Pre-operative Risk Assessment    Patient Name: Margaret White  DOB: Jun 04, 1951 MRN: [id number]      Request for Surgical Clearance    Procedure:   KNEE ARTHROSCOPY  Date of Surgery:  Clearance TBD                                 Surgeon:  DR Jean Rosenthal Surgeon's Group or Practice Name:  [facility] Phone number:  [phone] Fax number:  [phone] ATTN: SHERRIE   Type of Clearance Requested:   - Medical NO MEDICATIONS LISTED TO HOLD    Type of Anesthesia:   CHOICE   Additional requests/questions:    Jiles Prows   09/28/2022, 2:44 PM

## 2022-10-19 ENCOUNTER — Ambulatory Visit (INDEPENDENT_AMBULATORY_CARE_PROVIDER_SITE_OTHER): Payer: Medicare Other | Admitting: Family Medicine

## 2022-10-19 ENCOUNTER — Encounter: Payer: Self-pay | Admitting: Family Medicine

## 2022-10-19 VITALS — BP 100/74 | HR 97 | Temp 97.8°F | Ht 63.5 in | Wt 167.4 lb

## 2022-10-19 DIAGNOSIS — G4750 Parasomnia, unspecified: Secondary | ICD-10-CM
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
## 2022-10-19 NOTE — Patient Instructions (Signed)
Please follow up as scheduled for your next visit with me: 03/05/2023   If you have any questions or concerns, please don't hesitate to send me a message via MyChart or call the office at [phone]. Thank you for visiting with [place] today! It's our pleasure caring for you.   I have placed a referral to neurology to further evaluate your sleep problem.

## 2022-10-24 DIAGNOSIS — R519 Headache, unspecified: Secondary | ICD-10-CM | POA: Diagnosis not present

## 2022-10-24 DIAGNOSIS — R051 Acute cough: Secondary | ICD-10-CM | POA: Diagnosis not present

## 2022-10-24 DIAGNOSIS — R0981 Nasal congestion: Secondary | ICD-10-CM | POA: Diagnosis not present

## 2022-10-24 DIAGNOSIS — J0100 Acute maxillary sinusitis, unspecified: Secondary | ICD-10-CM | POA: Diagnosis not present

## 2022-10-31 DIAGNOSIS — R0981 Nasal congestion: Secondary | ICD-10-CM | POA: Diagnosis not present

## 2022-10-31 DIAGNOSIS — J309 Allergic rhinitis, unspecified: Secondary | ICD-10-CM | POA: Diagnosis not present

## 2022-11-08 ENCOUNTER — Other Ambulatory Visit: Payer: Self-pay | Admitting: Orthopaedic Surgery

## 2022-11-08 DIAGNOSIS — M948X6 Other specified disorders of cartilage, lower leg: Secondary | ICD-10-CM | POA: Diagnosis not present

## 2022-11-08 DIAGNOSIS — Y999 Unspecified external cause status: Secondary | ICD-10-CM | POA: Diagnosis not present

## 2022-11-08 DIAGNOSIS — G8918 Other acute postprocedural pain: Secondary | ICD-10-CM | POA: Diagnosis not present

## 2022-11-08 DIAGNOSIS — S83271A Complex tear of lateral meniscus, current injury, right knee, initial encounter: Secondary | ICD-10-CM | POA: Diagnosis not present

## 2022-11-08 DIAGNOSIS — X58XXXA Exposure to other specified factors, initial encounter: Secondary | ICD-10-CM | POA: Diagnosis not present

## 2022-11-08 DIAGNOSIS — M794 Hypertrophy of (infrapatellar) fat pad: Secondary | ICD-10-CM | POA: Diagnosis not present

## 2022-11-08 MED ORDER — HYDROCODONE-ACETAMINOPHEN 5-325 MG PO TABS: 1.0000 | ORAL_TABLET | Freq: Four times a day (QID) | ORAL | 0 refills | Status: DC | PRN

## 2022-11-09 ENCOUNTER — Telehealth: Payer: Self-pay | Admitting: Orthopaedic Surgery

## 2022-11-09 NOTE — Telephone Encounter (Signed)
Patient aware of the below message  

## 2022-11-09 NOTE — Telephone Encounter (Signed)
Patient would like to know weight baring status and restrictions please advise

## 2022-11-15 ENCOUNTER — Ambulatory Visit (INDEPENDENT_AMBULATORY_CARE_PROVIDER_SITE_OTHER): Payer: Medicare Other | Admitting: Orthopaedic Surgery

## 2022-11-15 ENCOUNTER — Encounter: Payer: Self-pay | Admitting: Orthopaedic Surgery

## 2022-11-15 DIAGNOSIS — Z9889 Other specified postprocedural states: Secondary | ICD-10-CM

## 2022-11-15 DIAGNOSIS — S83281D Other tear of lateral meniscus, current injury, right knee, subsequent encounter: Secondary | ICD-10-CM

## 2022-11-15 NOTE — Progress Notes (Signed)
The patient is a 72 year old who is here for first postoperative visit 1 week after a right knee arthroscopy.  She had a complex lateral meniscal tear.  She states she has good range of motion and strength and is doing well.  The sutures have been removed from her right knee incision.  She has good extension and pretty good flexion but she does have a large joint effusion.  I did speak with her about aspirating the effusion but she said she did not have any pain she would rather just see if it will dissolve on its own and a lot of times this will resolve with time.  I would like the have her increase her activities as comfort allows and can get back to the gym when she is comfortable.  Will see her back in 3 weeks to see how she is doing overall.  All question concerns were answered addressed.  I did go over the arthroscopy pictures with her as well.

## 2022-11-20 ENCOUNTER — Encounter: Payer: Self-pay | Admitting: Physician Assistant

## 2022-11-20 ENCOUNTER — Telehealth: Payer: Self-pay | Admitting: Orthopaedic Surgery

## 2022-11-20 ENCOUNTER — Ambulatory Visit (INDEPENDENT_AMBULATORY_CARE_PROVIDER_SITE_OTHER): Payer: Medicare Other | Admitting: Physician Assistant

## 2022-11-20 DIAGNOSIS — M25561 Pain in right knee: Secondary | ICD-10-CM | POA: Diagnosis not present

## 2022-11-20 MED ORDER — LIDOCAINE HCL 1 % IJ SOLN
3.0000 mL | INTRAMUSCULAR | Status: AC | PRN
  Administered 2022-11-20: 3 mL

## 2022-11-20 NOTE — Progress Notes (Signed)
Office Visit Note   Patient: Margaret White           Date of Birth: 02-08-1951           MRN: [id number] Visit Date: 11/20/2022              Requested by: Leamon Arnt, [address] [place],  [place] [address] PCP: Leamon Arnt, MD  Chief Complaint  Patient presents with  . Right Knee - Follow-up      HPI: Margaret White is 12 days status post right knee arthroscopy.  Overall she is doing well.  She did have some fluid in her knee at her first postop and was offered an aspiration.  At the time she declined because it was not really bothering her.  Comes in today requesting an aspiration.  Denies any fever chills or injuries.  Assessment & Plan: Visit Diagnoses: Postop  Plan: Will go forward with an aspiration aspirated 30 cc of blood-tinged fluid no evidence of any infection  Follow-Up Instructions: No follow-ups on file.   Ortho Exam  Patient is alert, oriented, no adenopathy, well-dressed, normal affect, normal respiratory effort. Right knee: She has resolving ecchymosis compartments are soft and nontender negative Homans' sign.  She has a moderate effusion.  No redness no cellulitis no signs of infection no warmth  Imaging: No results found. No images are attached to the encounter.  Labs: Lab Results  Component Value Date   HGBA1C 6.0 (A) 08/28/2022   HGBA1C 6.4 02/27/2022   HGBA1C 5.6 08/15/2021   ESRSEDRATE 17 06/24/2020     Lab Results  Component Value Date   ALBUMIN 4.3 08/28/2022   ALBUMIN 4.6 04/09/2022   ALBUMIN 4.4 02/27/2022    Lab Results  Component Value Date   MG 2.5 (H) 05/14/2018   Lab Results  Component Value Date   VD25OH 36.46 08/19/2019   VD25OH 31.44 10/14/2017   VD25OH 38.0 06/26/2017    No results found for: "PREALBUMIN"    Latest Ref Rng & Units 07/03/2022   12:53 PM 04/09/2022    4:01 PM 02/27/2022   12:10 PM  CBC EXTENDED  WBC 4.0 - 10.5 K/uL 4.5  5.4  4.9   RBC 3.87 - 5.11 Mil/uL 4.58  4.93  4.94    Hemoglobin 12.0 - 15.0 g/dL 13.7  13.9  14.1   HCT 36.0 - 46.0 % 40.8  43.5  42.9   Platelets 150.0 - 400.0 K/uL 303.0  214  259.0   NEUT# 1.7 - 7.7 K/uL  3.0  2.7   Lymph# 0.7 - 4.0 K/uL  1.4  1.6      There is no height or weight on file to calculate BMI.  Orders:  No orders of the defined types were placed in this encounter.  No orders of the defined types were placed in this encounter.    Procedures: Large Joint Inj: R knee on 11/20/2022 4:29 PM Indications: pain and diagnostic evaluation Details: 25 G 1.5 in needle, superolateral approach  Arthrogram: No  Medications: 3 mL lidocaine 1 % Aspirate: 30 mL blood-tinged Outcome: tolerated well, no immediate complications Procedure, treatment alternatives, risks and benefits explained, specific risks discussed. Consent was given by the patient.    Clinical Data: No additional findings.  ROS:  All other systems negative, except as noted in the HPI. Review of Systems  Objective: Vital Signs: There were no vitals taken for this visit.  Specialty Comments:  No specialty comments available.  PMFS History: Patient Active Problem List   Diagnosis Date Noted  . Heart murmur, systolic [id number]  . Family history of abdominal aortic aneurysm (AAA) 07/03/2022  . Periodic limb movement sleep disorder 08/15/2021  . Prediabetes 02/09/2021  . Tubular adenoma of colon 08/19/2019  . DOE (dyspnea on exertion) 05/14/2018  . Incontinence without sensory awareness 05/08/2018  . Mixed incontinence 05/08/2018  . 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 Statin intolerance 03/25/2018  . Bilateral primary osteoarthritis of hip 11/05/2017  . 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 GERD (gastroesophageal reflux disease) 10/14/2017  . Allergic rhinitis 10/14/2017  . NASH (nonalcoholic steatohepatitis) 10/14/2017  . OSA (obstructive sleep apnea)  10/14/2017  . History of cluster headache 10/14/2017  . IBS (irritable bowel syndrome) 10/14/2017  . Fibromyalgia 10/14/2017  . Chronic maxillary sinusitis 10/14/2017  . Obesity (BMI 30-39.9) 10/14/2017  . Mixed hyperlipidemia 10/14/2017  . Vitamin B12 deficiency 10/14/2017  . Vitamin D deficiency 10/14/2017  . Female cystocele 10/14/2017  . Primary osteoarthritis of both hands 07/10/2017  . Essential hypertension 06/26/2017  . Recurrent major depressive disorder, in full remission ([place]) 06/26/2017   Past Medical History:  Diagnosis Date  . Allergic rhinitis   . Allergy   . Anxiety   . Asthma   . Avascular necrosis of hip, right ([place]) 03/24/2018  . Cataract   . Chronic cluster headache   . Chronic sinusitis   . Diabetes mellitus type 2, diet-controlled ([place])    per pt currently no taking metformin, followed by pcp  . Eczema   . Essential hypertension    cardiologsit-- dr dr Bettina Gavia  . Family history of abdominal aortic aneurysm (AAA) 07/03/2022   brother  . Fatty liver   . Fibromyalgia   . GERD (gastroesophageal reflux disease)   . Hepatic steatosis 10/14/2017  . Hiatal hernia   . 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 History of colon polyps   . History of hyperthyroidism 2005  . History of pneumonia, recurrent   . History of seizure 2006   x1  . IBS (irritable bowel syndrome)   . IBS (irritable bowel syndrome)   . Mixed hyperlipidemia   . Nodule of lower lobe of left lung 10/2018   100m;   pulmologist-- dr iMeda Coffee bLeory Plowman stable per lov note in epic  . Ocular rosacea   . OSA (obstructive sleep apnea)    per pt intolerant to cpap due to sinus issues,  uses mouth guard   . 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 Pneumonia   . Primary osteoarthritis of both hands 07/10/2017  . PTSD (post-traumatic stress disorder)   . Recurrent major depressive disorder, in full remission ([place] 06/26/2017  . RLS (restless legs syndrome)   . Seasonal allergies   . Seizure ([place] 2006   x 1  . Sleep apnea   . Solitary pulmonary nodule    pulmologist-- dr i. bLeory Plowman, lov note in epic,  stable  . Spondylosis of lumbar region without myelopathy or radiculopathy 11/04/2017   MRI 11/01/2017: Severe L4-5 facet arthrosis on the right greater than  left. Shallow disc bulging without significant neuroforaminal stenosis.  Marland Kitchen SUI (stress urinary incontinence, female)   . Vitamin B 12 deficiency   . Vitamin D deficiency     Family History  Problem Relation Age of Onset  . Hypertension Mother   . Congestive Heart Failure Mother   . Multiple myeloma Mother   . Heart disease Father   . Prostate cancer Father   . Emphysema Sister   . Heart disease Brother   . Other Brother        brain tumor  . Heart disease Maternal Grandmother   . Emphysema Maternal Grandmother   . CAD Maternal Grandfather   . Heart disease Maternal Grandfather   . Cancer Paternal Grandmother        type unknown, mets  . Alcoholism Paternal Grandfather   . Colon cancer Neg Hx   . Esophageal cancer Neg Hx   . Stomach cancer Neg Hx   . Rectal cancer Neg Hx     Past Surgical History:  Procedure Laterality Date  . ABDOMINOPLASTY  2006  . ANAL FISSURE REPAIR  2006  . BIOPSY  10/27/2020   Procedure: BIOPSY;  Surgeon: Rush Landmark Telford Nab., MD;  Location: [facility];  Service: Gastroenterology;;  . BLADDER SURGERY  1984  . BREAST BIOPSY Right     benign    . CARPAL TUNNEL RELEASE Bilateral 1989  . CATARACT EXTRACTION W/ INTRAOCULAR LENS  IMPLANT, BILATERAL  2017  . COLONOSCOPY    . COLONOSCOPY W/ POLYPECTOMY  2017  . [place]  . ESOPHAGOGASTRODUODENOSCOPY (EGD) WITH PROPOFOL  N/A 10/27/2020   Procedure: ESOPHAGOGASTRODUODENOSCOPY (EGD) WITH PROPOFOL;  Surgeon: Rush Landmark Telford Nab., MD;  Location: [facility];  Service: Gastroenterology;  Laterality: N/A;  . EYE SURGERY  child    x4  1957-1960  . INCONTINENCE SURGERY    . NASAL SEPTUM SURGERY  1987  . PUBOVAGINAL SLING N/A 03/24/2019   Procedure: CYSTOSCOPY  Gaynelle Arabian;  Surgeon: Bjorn Loser, MD;  Location: [facility];  Service: Urology;  Laterality: N/A;  . REVISION TOTAL HIP ARTHROPLASTY Right   . RHINOPLASTY  2007  . TOTAL ABDOMINAL HYSTERECTOMY W/ BILATERAL SALPINGOOPHORECTOMY  1984   w/ Marshall-Marchetti (bladder tack suspension)  . TOTAL HIP ARTHROPLASTY Right 04/11/2018   Procedure: RIGHT TOTAL HIP ARTHROPLASTY ANTERIOR APPROACH;  Surgeon: Mcarthur Rossetti, MD;  Location: [facility];  Service: Orthopedics;  Laterality: Right;  . ULNAR NERVE TRANSPOSITION Left 1990s  . UPPER ESOPHAGEAL ENDOSCOPIC ULTRASOUND (EUS) N/A 10/27/2020   Procedure: UPPER ESOPHAGEAL ENDOSCOPIC ULTRASOUND (EUS);  Surgeon: Irving Copas., MD;  Location: [facility];  Service: Gastroenterology;  Laterality: N/A;   Social History   Occupational History  . Occupation: Product manager: [employer]    Comment: retired  Tobacco Use  . Smoking status: Never  . Smokeless tobacco: Never  Vaping Use  . Vaping Use: Never used  Substance and Sexual Activity  . Alcohol use: No  . Drug use: Never  . Sexual activity: Yes    Birth control/protection: Surgical

## 2022-11-20 NOTE — Telephone Encounter (Signed)
Patient states she has a lot of swelling on her knee and that dr, blackman told her to get it drained but she put it off. She is now advised she needs to be seen ASAP. Appointment made with Audrea Muscat today @ 2:30. Just an FYI if I need to change it let me know. Patient was advised that Dr. Ninfa Linden had no openings today.Marland Kitchen

## 2022-12-02 DIAGNOSIS — R509 Fever, unspecified: Secondary | ICD-10-CM | POA: Diagnosis not present

## 2022-12-02 DIAGNOSIS — G44099 Other trigeminal autonomic cephalgias (TAC), not intractable: Secondary | ICD-10-CM | POA: Diagnosis not present

## 2022-12-02 DIAGNOSIS — J029 Acute pharyngitis, unspecified: Secondary | ICD-10-CM | POA: Diagnosis not present

## 2022-12-02 DIAGNOSIS — B349 Viral infection, unspecified: Secondary | ICD-10-CM | POA: Diagnosis not present

## 2022-12-03 ENCOUNTER — Institutional Professional Consult (permissible substitution): Payer: Medicare Other | Admitting: Neurology

## 2022-12-05 ENCOUNTER — Telehealth: Payer: Self-pay | Admitting: Orthopaedic Surgery

## 2022-12-05 NOTE — Telephone Encounter (Signed)
noted 

## 2022-12-05 NOTE — Telephone Encounter (Signed)
Patient called advise she need to cancel her appointment due to her having the Flu. Patient said she will call back to R/S when she is feeling better.   Pt # [phone]

## 2022-12-06 ENCOUNTER — Encounter: Payer: Medicare Other | Admitting: Orthopaedic Surgery

## 2022-12-07 ENCOUNTER — Ambulatory Visit: Payer: Medicare Other | Admitting: Family Medicine

## 2022-12-07 ENCOUNTER — Encounter (HOSPITAL_COMMUNITY): Payer: Self-pay | Admitting: *Deleted

## 2022-12-07 ENCOUNTER — Ambulatory Visit (INDEPENDENT_AMBULATORY_CARE_PROVIDER_SITE_OTHER): Payer: Medicare Other

## 2022-12-07 ENCOUNTER — Ambulatory Visit (HOSPITAL_COMMUNITY)
Admission: EM | Admit: 2022-12-07 | Discharge: 2022-12-07 | Disposition: A | Discharge status: Home / Self Care | Payer: Medicare Other | Attending: Emergency Medicine | Admitting: Emergency Medicine

## 2022-12-07 DIAGNOSIS — R058 Other specified cough: Secondary | ICD-10-CM

## 2022-12-07 DIAGNOSIS — R059 Cough, unspecified: Secondary | ICD-10-CM

## 2022-12-07 MED ORDER — PROMETHAZINE-DM 6.25-15 MG/5ML PO SYRP: 5.0000 mL | ORAL_SOLUTION | Freq: Three times a day (TID) | ORAL | 0 refills | Status: DC | PRN

## 2022-12-07 NOTE — ED Triage Notes (Signed)
Pt states she was dx with flu on Sunday and she still has the cough she just wants her lungs checked. She finished her tamiflu.    Her husband is inpt at [facility] currently.

## 2022-12-07 NOTE — Discharge Instructions (Addendum)
Your chest x-ray was negative for infection.  I suspect your residual cough is due to to your previous influenza infection.  Please continue your symptomatic management.  You can also take Promethazine DM up to 3 times daily as needed for cough, I would recommend taking this at night, as it can make you drowsy.  Please do not drink or drive on this medication and take sparingly.  Your symptoms should improve over the next week.  Please seek follow-up care if you develop fever, shortness of breath, chest pain, or worsening of condition.

## 2022-12-07 NOTE — ED Provider Notes (Signed)
[facility]    CSN: [id number] Arrival date & time: 12/07/22  1717      History   Chief Complaint Chief Complaint  Patient presents with   Cough    HPI Margaret White is a 72 y.o. female.   Patient was diagnosed with Flu A, just finished up Tamiflu yesterday. She presents to clinic for continued cough, reports a 'loose cough that sounds like it is deep in my chest.' Is not productive. No fevers. Denies chest pain, wheezing or SOB.    Husband is admitted with flu A, a 'small stroke' and they are 'checking his lungs' currently.   The history is provided by the patient.  Cough Associated symptoms: no chest pain, no chills, no ear pain, no fever, no rash, no shortness of breath and no sore throat     Past Medical History:  Diagnosis Date   Allergic rhinitis    Allergy    Anxiety    Asthma    Avascular necrosis of hip, right ([place]) 03/24/2018   Cataract    Chronic cluster headache    Chronic sinusitis    Diabetes mellitus type 2, diet-controlled ([address])    per pt currently no taking metformin, followed by pcp   Eczema    Essential hypertension    cardiologsit-- dr dr Bettina Gavia   Family history of abdominal aortic aneurysm (AAA) 07/03/2022   brother   Fatty liver    Fibromyalgia    GERD (gastroesophageal reflux disease)    Hepatic steatosis 10/14/2017   Hiatal hernia    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   Bilateral right worse than left that is mild in nature X-rays obtained in [place] on 07/12/2017 that are available through the [facility] PACS system   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   History of colon polyps    History of hyperthyroidism 2005   History of pneumonia, recurrent    History of seizure 2006   x1   IBS (irritable bowel syndrome)    IBS (irritable bowel syndrome)    Mixed hyperlipidemia    Nodule of lower lobe of left lung 10/2018   92m;   pulmologist-- dr iMeda Coffee bLeory Plowman stable per lov note in epic   Ocular  rosacea    OSA (obstructive sleep apnea)    per pt intolerant to cpap due to sinus issues,  uses mouth guard    [place]is [place] 11/05/2017   [facility]    Pneumonia    Primary osteoarthritis of both hands 07/10/2017   PTSD (post-traumatic stress disorder)    Recurrent major depressive disorder, in full remission [place] 06/26/2017   RLS (restless legs syndrome)    Seasonal allergies    Seizure ([place] 2006   x 1   Sleep apnea    Solitary pulmonary nodule    pulmologist-- dr iMeda Coffee bLeory Plowman, lov note in epic,  stable   Spondylosis of lumbar region without myelopathy or radiculopathy 11/04/2017   MRI 11/01/2017: Severe L4-5 facet arthrosis on the right greater than left. Shallow disc bulging without significant neuroforaminal stenosis.   SUI (stress urinary incontinence, female)    Vitamin B 12 deficiency    Vitamin D deficiency     Patient Active Problem List   Diagnosis Date Noted   Heart murmur, systolic [id number]  Family history of abdominal aortic aneurysm (AAA) 07/03/2022   Periodic limb movement sleep  disorder 08/15/2021   Prediabetes 02/09/2021   Tubular adenoma of colon 08/19/2019   DOE (dyspnea on exertion) 05/14/2018   Incontinence without sensory awareness 05/08/2018   Mixed incontinence 05/08/2018   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   Statin intolerance 03/25/2018   Bilateral primary osteoarthritis of hip 11/05/2017   Spondylosis of lumbar region without myelopathy or radiculopathy 11/04/2017   GERD (gastroesophageal reflux disease) 10/14/2017   Allergic rhinitis 10/14/2017   NASH (nonalcoholic steatohepatitis) 10/14/2017   OSA (obstructive sleep apnea) 10/14/2017   History of cluster headache 10/14/2017   IBS (irritable bowel syndrome) 10/14/2017   Fibromyalgia 10/14/2017   Chronic maxillary sinusitis 10/14/2017   Obesity (BMI 30-39.9)  10/14/2017   Mixed hyperlipidemia 10/14/2017   Vitamin B12 deficiency 10/14/2017   Vitamin D deficiency 10/14/2017   Female cystocele 10/14/2017   Primary osteoarthritis of both hands 07/10/2017   Essential hypertension 06/26/2017   Recurrent major depressive disorder, in full remission ([place]) 06/26/2017    Past Surgical History:  Procedure Laterality Date   ABDOMINOPLASTY  2006   ANAL FISSURE REPAIR  2006   BIOPSY  10/27/2020   Procedure: BIOPSY;  Surgeon: Irving Copas., MD;  Location[facility] END[facility]COPY;  Service: Gastroenterology;;   BLADDER SURGERY  1984   BREAST BIOPSY Right     benign     CARPAL TUNNEL RELEASE Bilateral 1989   CATARACT EXTRACTION W/ INTRAOCULAR LENS  IMPLANT, BILATERAL  2017   COLONOSCOPY     COLONOSCOPY W/ POLYPECTOMY  2017   ECTOPIC PREGNANCY SURGERY  1979   ESOPHAGOGASTRODUODENOSCOPY (EGD) WITH PROPOFOL N/A 10/27/2020   Procedure: ESOPHAGOGASTRODUODENOSCOPY (EGD) WITH PROPOFOL;  Surgeon: Irving Copas., MD;  Location: [facility] ENDOSCOPY;  Service: Gastroenterology;  Laterality: N/A;   EYE SURGERY  child    x4  1957-1960   INCONTINENCE SURGERY     NASAL SEPTUM SURGERY  1987   PUBOVAGINAL SLING N/A 03/24/2019   Procedure: CYSTOSCOPY  Gaynelle Arabian;  Surgeon: Bjorn Loser, MD;  Location: [facility];  Service: Urology;  Laterality: N/A;   REVISION TOTAL HIP ARTHROPLASTY Right    RHINOPLASTY  2007   TOTAL ABDOMINAL HYSTERECTOMY W/ BILATERAL SALPINGOOPHORECTOMY  1984   w/ Marshall-Marchetti (bladder tack suspension)   TOTAL HIP ARTHROPLASTY Right 04/11/2018   Procedure: RIGHT TOTAL HIP ARTHROPLASTY ANTERIOR APPROACH;  Surgeon: Mcarthur Rossetti, MD;  Location: [facility] OR[facility];  Service: Orthopedics;  Laterality: Right;   ULNAR NERVE TRANSPOSITION Left 1990s   UPPER ESOPHAGEAL ENDOSCOPIC ULTRASOUND (EUS) N/A 10/27/2020   Procedure: UPPER ESOPHAGEAL ENDOSCOPIC ULTRASOUND (EUS);  Surgeon: Irving Copas., MD;  Location: [facility];  Service: Gastroenterology;  Laterality: N/A;    OB History   No obstetric history on file.      Home Medications    Prior to Admission medications   Medication Sig Start Date End Date Taking? Authorizing Provider  amLODipine (NORVASC) 5 MG tablet TAKE 1 TABLET DAILY 01/01/22  Yes Leamon Arnt, MD  calcium carbonate (OS-CAL) 600 MG TABS tablet Take 600 mg by mouth daily with breakfast.   Yes [provider]  cetirizine (ZYRTEC) 10 MG tablet Take 10 mg by mouth daily.   Yes [provider]  DULoxetine (CYMBALTA) 60 MG capsule TAKE 1 CAPSULE DAILY (REPLACING 30 MG PRESCRIPTION) 05/31/22  Yes Leamon Arnt, MD  esomeprazole (NEXIUM) 40 MG capsule Take 1 capsule (40 mg total) by mouth 2 (two) times daily before a meal. 08/28/22  Yes Leamon Arnt, MD  ezetimibe (ZETIA) 10 MG tablet Take 1 tablet (10 mg total) by mouth daily. 05/11/22  Yes Leamon Arnt, MD  FLUoxetine (PROZAC) 10 MG tablet TAKE 1 TABLET DAILY 02/12/22  Yes Leamon Arnt, MD  fluticasone [facility]) 50 MCG/ACT nasal spray USE 1 SPRAY IN EACH NOSTRIL DAILY AS NEEDED FOR ALLERGIES 03/22/22  Yes Leamon Arnt, MD  gabapentin (NEURONTIN) 300 MG capsule TAKE 1 CAPSULE THREE TIMES A DAY 07/18/22  Yes Leamon Arnt, MD  Polyvinyl Alcohol-Povidone PF (REFRESH) 1.4-0.6 % SOLN Place 1 drop into both eyes daily as needed (Dry eye).   Yes [provider]  promethazine-dextromethorphan (PROMETHAZINE-DM) 6.25-15 MG/5ML syrup Take 5 mLs by mouth 3 (three) times daily as needed for cough. 12/07/22  Yes Cathie Bonnell, [place], FNP    Family History Family History  Problem Relation Age of Onset   Hypertension Mother    Congestive Heart Failure Mother    Multiple myeloma Mother    Heart disease Father    Prostate cancer Father    Emphysema Sister    Heart disease Brother    Other Brother        brain tumor   Heart disease Maternal Grandmother    Emphysema Maternal Grandmother    CAD Maternal  Grandfather    Heart disease Maternal Grandfather    Cancer Paternal Grandmother        type unknown, mets   Alcoholism Paternal Grandfather    Colon cancer Neg Hx    Esophageal cancer Neg Hx    Stomach cancer Neg Hx    Rectal cancer Neg Hx     Social History Social History   Tobacco Use   Smoking status: Never   Smokeless tobacco: Never  Vaping Use   Vaping Use: Never used  Substance Use Topics   Alcohol use: No   Drug use: Never     Allergies   Codeine and Sulfa antibiotics   Review of Systems Review of Systems  Constitutional:  Negative for chills and fever.  HENT:  Negative for ear pain and sore throat.   Eyes:  Negative for pain and visual disturbance.  Respiratory:  Positive for cough. Negative for shortness of breath.   Cardiovascular:  Negative for chest pain and palpitations.  Gastrointestinal:  Negative for abdominal pain and vomiting.  Genitourinary:  Negative for dysuria and hematuria.  Musculoskeletal:  Negative for arthralgias and back pain.  Skin:  Negative for color change and rash.  Neurological:  Negative for seizures and syncope.  All other systems reviewed and are negative.    Physical Exam Triage Vital Signs ED Triage Vitals  Enc Vitals Group     BP 12/07/22 1758 116/89     Pulse Rate 12/07/22 1758 97     Resp 12/07/22 1758 20     Temp 12/07/22 1758 98.6 F (37 C)     Temp Source 12/07/22 1758 Oral     SpO2 12/07/22 1758 96 %     Weight --      Height --      Head Circumference --      Peak Flow --      Pain Score 12/07/22 1755 0     Pain Loc --      Pain Edu? --      Excl. in [place]? --    No data found.  Updated Vital Signs BP 116/89 (BP Location: Right Arm)   Pulse 97  Temp 98.6 F (37 C) (Oral)   Resp 20   SpO2 96%   Visual Acuity Right Eye Distance:   Left Eye Distance:   Bilateral Distance:    Right Eye Near:   Left Eye Near:    Bilateral Near:     Physical Exam Vitals and nursing note reviewed.   Constitutional:      General: She is not in acute distress.    Appearance: She is well-developed.     Comments: Pleasant 72 year old female who appears stated age.  HENT:     Head: Normocephalic and atraumatic.     Nose: Nose normal.     Mouth/Throat:     Mouth: Mucous membranes are moist.  Eyes:     Conjunctiva/sclera: Conjunctivae normal.  Cardiovascular:     Rate and Rhythm: Normal rate and regular rhythm.     Heart sounds: Normal heart sounds, S1 normal and S2 normal. No murmur heard. Pulmonary:     Effort: Pulmonary effort is normal. No respiratory distress.     Breath sounds: Normal breath sounds. No wheezing or rhonchi.  Musculoskeletal:        General: No swelling.     Cervical back: Neck supple.  Skin:    General: Skin is warm and dry.     Capillary Refill: Capillary refill takes less than 2 seconds.  Neurological:     Mental Status: She is alert.  Psychiatric:        Mood and Affect: Mood normal.        Behavior: Behavior is cooperative.      UC Treatments / Results  Labs (all labs ordered are listed, but only abnormal results are displayed) Labs Reviewed - No data to display  EKG   Radiology DG Chest 2 View  Result Date: 12/07/2022 CLINICAL DATA:  Cough EXAM: CHEST - 2 VIEW COMPARISON:  Chest x-ray 07/03/2022.  CT of the chest 01/01/2020. FINDINGS: There is stable linear atelectasis or scarring in the lingula. The lungs are otherwise clear. Cardiomediastinal silhouette is within normal limits. There is no pleural effusion or pneumothorax. No acute fractures are seen. IMPRESSION: No active cardiopulmonary disease. Electronically Signed   By: Ronney Asters M.D.   On: 12/07/2022 18:41    Procedures Procedures (including critical care time)  Medications Ordered in UC Medications - No data to display  Initial Impression / Assessment and Plan / UC Course  I have reviewed the triage vital signs and the nursing notes.  Pertinent labs & imaging results that  were available during my care of the patient were reviewed by me and considered in my medical decision making (see chart for details).  Vitals and triage reviewed, patient is hemodynamically stable.  Lungs vesicular on exam.  Finish Tamiflu yesterday, was diagnosed with flu a on Sunday.  Hacking, dry nonproductive cough, chest x-ray was negative for infection or infiltrate, I agree with radiology.  I suspect she has a postviral cough.  Advised to continue symptomatic management and promethazine DM sparingly at night.  Follow-up care and return precautions discussed, patient verbalized understanding.    Final Clinical Impressions(s) / UC Diagnoses   Final diagnoses:  Post-viral cough syndrome     Discharge Instructions      Your chest x-ray was negative for infection.  I suspect your residual cough is due to to your previous influenza infection.  Please continue your symptomatic management.  You can also take Promethazine DM up to 3 times daily as needed for cough, I  would recommend taking this at night, as it can make you drowsy.  Please do not drink or drive on this medication and take sparingly.  Your symptoms should improve over the next week.  Please seek follow-up care if you develop fever, shortness of breath, chest pain, or worsening of condition.      ED Prescriptions     Medication Sig Dispense Auth. Provider   promethazine-dextromethorphan (PROMETHAZINE-DM) 6.25-15 MG/5ML syrup Take 5 mLs by mouth 3 (three) times daily as needed for cough. 118 mL Cadee Agro, [place], [place]      I have reviewed the PDMP during this encounter.   Jaysion Ramseyer, [place] N, [place] 12/07/22 [phone]

## 2022-12-09 ENCOUNTER — Encounter: Payer: Self-pay | Admitting: Family Medicine

## 2022-12-10 ENCOUNTER — Other Ambulatory Visit: Payer: Self-pay

## 2022-12-10 DIAGNOSIS — I10 Essential (primary) hypertension: Secondary | ICD-10-CM

## 2022-12-10 MED ORDER — AMLODIPINE BESYLATE 5 MG PO TABS: 5.0000 mg | ORAL_TABLET | Freq: Every day | ORAL | 3 refills | Status: DC

## 2022-12-10 MED ORDER — DULOXETINE HCL 60 MG PO CPEP: ORAL_CAPSULE | ORAL | 3 refills | Status: AC

## 2022-12-10 NOTE — Telephone Encounter (Signed)
Rx sent 

## 2022-12-13 ENCOUNTER — Encounter: Payer: Self-pay | Admitting: Radiology

## 2022-12-27 ENCOUNTER — Ambulatory Visit (INDEPENDENT_AMBULATORY_CARE_PROVIDER_SITE_OTHER): Payer: Medicare Other | Admitting: Physician Assistant

## 2022-12-27 DIAGNOSIS — Z9889 Other specified postprocedural states: Secondary | ICD-10-CM

## 2022-12-27 NOTE — Progress Notes (Addendum)
HPI: Margaret White returns to me follow-up right knee arthroscopy 11/08/2018.She underwent a right arthroscopy for complex lateral meniscus tear.  Otherwise review of is well-preserved.  She states at this point in time she is not having constant pain does have pain which is severe if she puts pressure on the knee while kneeling.  She also denies swelling about the knee.  No mechanical symptoms.  Physical exam: Right knee range of motion.  No instability.  Positive effusion no abnormal warmth erythema.  Port sites well-healed.  Calf supple nontender.  Impression: Status post right knee arthroscopy 11/08/2022  Plan: Given the fact that she is not having significant pain in the knee with pressure. Recommend compression .  Ace bandage is applied she will wear this just during the day.  She also might benefit from a copper fit knee sleeve.  Will see her back in 4 weeks see how she is doing overall.  Questions encouraged and answered.  She is unable to take NSAIDs.

## 2023-01-01 ENCOUNTER — Ambulatory Visit (INDEPENDENT_AMBULATORY_CARE_PROVIDER_SITE_OTHER): Payer: Medicare Other | Admitting: Podiatry

## 2023-01-01 DIAGNOSIS — L600 Ingrowing nail: Secondary | ICD-10-CM | POA: Diagnosis not present

## 2023-01-01 NOTE — Patient Instructions (Signed)

## 2023-01-01 NOTE — Progress Notes (Signed)
Subjective:  Patient ID: Margaret White, female    DOB: 1950-12-26,  MRN: [id number]  Chief Complaint  Patient presents with   Nail Problem    Right great toe nail pt stated she thinks she has a hang nail     72 y.o. female presents with concern for pain on the right great toenail on the inside border of the nail.  She has previously tried to take this out herself and has gotten it back pretty far but it keeps coming back and is painful with any pressure on the area.  Denies any redness swelling drainage from the area.  Past Medical History:  Diagnosis Date   Allergic rhinitis    Allergy    Anxiety    Asthma    Avascular necrosis of hip, right ([place]) 03/24/2018   Cataract    Chronic cluster headache    Chronic sinusitis    Diabetes mellitus type 2, diet-controlled ([place])    per pt currently no taking metformin, followed by pcp   Eczema    Essential hypertension    cardiologsit-- dr dr Bettina Gavia   Family history of abdominal aortic aneurysm (AAA) 07/03/2022   brother   Fatty liver    Fibromyalgia    GERD (gastroesophageal reflux disease)    Hepatic steatosis 10/14/2017   Hiatal hernia    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   Bilateral right worse than left that is mild in nature X-rays obtained in [place] on 07/12/2017 that are available through the [facility] PACS system   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   History of colon polyps    History of hyperthyroidism 2005   History of pneumonia, recurrent    History of seizure 2006   x1   IBS (irritable bowel syndrome)    IBS (irritable bowel syndrome)    Mixed hyperlipidemia    Nodule of lower lobe of left lung 10/2018   37mm;   pulmologist-- dr Meda Coffee. 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 Leory Plowman , lov note in epic,  stable   Spondylosis of lumbar region without myelopathy or radiculopathy 11/04/2017   MRI 11/01/2017: Severe L4-5 facet arthrosis on the right greater than left. Shallow disc bulging without significant neuroforaminal stenosis.   SUI (stress urinary incontinence, female)    Vitamin B 12 deficiency    Vitamin D deficiency     Allergies  Allergen Reactions   Codeine Itching   Sulfa Antibiotics Rash and Other (See Comments)    Flu like symptoms    ROS: Negative except as per HPI above  Objective:  General: AAO x3, NAD  Dermatological: Incurvation is present along the medial nail border of the right great toe. There is localized edema without any erythema or increase in warmth around the nail  border. There is no drainage or pus. There is no ascending cellulitis. No malodor. No open lesions or pre-ulcerative lesions.    Vascular:  Dorsalis Pedis artery and Posterior Tibial artery pedal pulses are 2/4 bilateral.  Capillary fill time < 3 sec to all digits.   Neruologic: Grossly intact via light touch bilateral. Protective threshold intact to all sites bilateral.   Musculoskeletal: No gross boney pedal deformities bilateral. No pain, crepitus, or limitation noted with foot and ankle range of motion bilateral. Muscular strength 5/5 in all groups tested bilateral.  Gait: Unassisted, Nonantalgic.   No images are attached to the encounter.  Radiographs:  Deferred Assessment:   1. Ingrown nail of great toe of right foot      Plan:  Patient was evaluated and treated and all questions  answered.  Ingrown Nail, right -Patient elects to proceed with minor surgery to remove ingrown toenail today. Consent reviewed and signed by patient. -Ingrown nail excised. See procedure note. -Educated on post-procedure care including soaking. Written instructions provided and reviewed. -Patient to follow up in 2 weeks for nail check.  Procedure: Excision of Ingrown Toenail Location: Right 1st toe medial nail borders. Anesthesia: Lidocaine 1% plain; 1.5 mL and Marcaine 0.5% plain; 1.5 mL, digital block. Skin Prep: Betadine. Dressing: Silvadene; telfa; dry, sterile, compression dressing. Technique: Following skin prep, the toe was exsanguinated and a tourniquet was secured at the base of the toe. The affected nail border was freed, split with a nail splitter, and excised. Chemical matrixectomy was then performed with phenol and irrigated out with alcohol. The tourniquet was then removed and sterile dressing applied. Disposition: Patient tolerated procedure well. Patient to return in 2 weeks for follow-up.    Return in about 2 weeks (around 01/15/2023) for Follow-up right hallux medial border.          Everitt Amber, DPM [facility]

## 2023-01-02 ENCOUNTER — Encounter: Payer: Self-pay | Admitting: Neurology

## 2023-01-02 ENCOUNTER — Ambulatory Visit (INDEPENDENT_AMBULATORY_CARE_PROVIDER_SITE_OTHER): Payer: Medicare Other | Admitting: Neurology

## 2023-01-02 VITALS — BP 130/92 | HR 101 | Ht 63.5 in | Wt 172.0 lb

## 2023-01-02 DIAGNOSIS — G4753 Recurrent isolated sleep paralysis: Secondary | ICD-10-CM

## 2023-01-02 DIAGNOSIS — G4713 Recurrent hypersomnia: Secondary | ICD-10-CM | POA: Insufficient documentation

## 2023-01-02 DIAGNOSIS — G44019 Episodic cluster headache, not intractable: Secondary | ICD-10-CM | POA: Diagnosis not present

## 2023-01-02 DIAGNOSIS — G4733 Obstructive sleep apnea (adult) (pediatric): Secondary | ICD-10-CM | POA: Diagnosis not present

## 2023-01-02 DIAGNOSIS — R442 Other hallucinations: Secondary | ICD-10-CM | POA: Diagnosis not present

## 2023-01-02 DIAGNOSIS — F515 Nightmare disorder: Secondary | ICD-10-CM | POA: Diagnosis not present
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
## 2023-01-02 NOTE — Patient Instructions (Signed)
1) OSA diagnosed years ago and was considered CPAP intolerant, now using a MOSES device and it reduced snoring. Did it reduce apnea ?   2) reports morning phlegm and mucous, dry mouth. Still a mouth breather.   3) cluster headaches , still once a week.  This can relate to sleep hypoxia.   4) spells of nightmares- would be seizure or hypnopompic hallucinations? Reports isolated Sleep paralysis .   5) hypersomnia long sleep time, delayed sleep cycle.  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    I will order an attended PSG with the goal to  see sleep without dental device for the first 2-3 hours than use the dental device for the second half.   Watching for parasomnia, dream enactment.  Narcolepsy panel ordered.  Seizure history and TBI history.   Sleep Apnea Sleep apnea affects breathing during sleep. It causes breathing to stop for 10 seconds or more, or to become shallow. People with sleep apnea usually snore loudly. It can also increase the risk of: Heart attack. Stroke. Being very overweight (obese). Diabetes. Heart failure. Irregular heartbeat. High blood pressure. The goal of treatment is to help you breathe normally again. What are the causes?  The most common cause of this condition is a collapsed or blocked airway. There are three kinds of sleep apnea: Obstructive sleep apnea. This is caused by a blocked or collapsed airway. Central sleep apnea. This happens when the brain does not send the right signals to the muscles that control breathing. Mixed sleep apnea. This is a combination of obstructive and central sleep apnea. What increases the risk? Being overweight. Smoking. Having a small airway. Being older. Being female. Drinking alcohol. Taking medicines to calm yourself (sedatives or tranquilizers). Having family members with the condition. Having a tongue or tonsils that are larger than normal. What are the signs or  symptoms? Trouble staying asleep. Loud snoring. Headaches in the morning. Waking up gasping. Dry mouth or sore throat in the morning. Being sleepy or tired during the day. If you are sleepy or tired during the day, you may also: Not be able to focus your mind (concentrate). Forget things. Get angry a lot and have mood swings. Feel sad (depressed). Have changes in your personality. Have less interest in sex, if you are female. Be unable to have an erection, if you are female. How is this treated?  Sleeping on your side. Using a medicine to get rid of mucus in your nose (decongestant). Avoiding the use of alcohol, medicines to help you relax, or certain pain medicines (narcotics). Losing weight, if needed. Changing your diet. Quitting smoking. Using a machine to open your airway while you sleep, such as: An oral appliance. This is a mouthpiece that shifts your lower jaw forward. A CPAP device. This device blows air through a mask when you breathe out (exhale). An EPAP device. This has valves that you put in each nostril. A BIPAP device. This device blows air through a mask when you breathe in (inhale) and breathe out. Having surgery if other treatments do not work. Follow these instructions at home: Lifestyle Make changes that your doctor recommends. Eat a healthy diet. Lose weight if needed. Avoid alcohol, medicines to help you relax, and some pain medicines. Do not smoke or use any products that contain nicotine or tobacco. If you need help quitting, ask your doctor. General instructions Take over-the-counter and prescription medicines only as told by  your doctor. If you were given a machine to use while you sleep, use it only as told by your doctor. If you are having surgery, make sure to tell your doctor you have sleep apnea. You may need to bring your device with you. Keep all follow-up visits. Contact a doctor if: The machine that you were given to use during sleep bothers  you or does not seem to be working. You do not get better. You get worse. Get help right away if: Your chest hurts. You have trouble breathing in enough air. You have an uncomfortable feeling in your back, arms, or stomach. You have trouble talking. One side of your body feels weak. A part of your face is hanging down. These symptoms may be an emergency. Get help right away. Call your local emergency services (911 in the [place] Do not wait to see if the symptoms will go away. Do not drive yourself to the hospital. Summary This condition affects breathing during sleep. The most common cause is a collapsed or blocked airway. The goal of treatment is to help you breathe normally while you sleep. This information is not intended to replace advice given to you by your health care provider. Make sure you discuss any questions you have with your health care provider. Document Revised: 05/03/2021 Document Reviewed: 09/02/2020 Elsevier Patient Education  [place].

## 2023-01-14 ENCOUNTER — Ambulatory Visit (INDEPENDENT_AMBULATORY_CARE_PROVIDER_SITE_OTHER): Payer: Medicare Other | Admitting: Podiatry

## 2023-01-14 DIAGNOSIS — L600 Ingrowing nail: Secondary | ICD-10-CM

## 2023-01-14 NOTE — Progress Notes (Signed)
Subjective: Margaret White is a 72 y.o.  female returns to office today for follow up evaluation after having right Hallux medial border nail ingrown removal with phenol and alcohol matrixectomy approximately 2 weeks ago. Patient has been soaking using epsom salts and applying topical antibiotic covered with bandaid daily. Patient denies fevers, chills, nausea, vomiting. Denies any calf pain, chest pain, SOB.   Objective:  Vitals: Reviewed  General: Well developed, nourished, in no acute distress, alert and oriented x3   Dermatology: Skin is warm, dry and supple bilateral. right hallux nail border appears to be clean, dry, with mild granular tissue and surrounding scab. There is no surrounding erythema, edema, drainage/purulence. The remaining nails appear unremarkable at this time. There are no other lesions or other signs of infection present.  Neurovascular status: Intact. No lower extremity swelling; No pain with calf compression bilateral.  Musculoskeletal: Decreased tenderness to palpation of the right hallux nail fold(s). Muscular strength within normal limits bilateral.   Assesement and Plan: S/p phenol and alcohol matrixectomy to the  right hallux nail medial border, doing well.   -Continue soaking in epsom salts twice a day followed by antibiotic ointment and a band-aid. Can leave uncovered at night. Continue this until completely healed.  -If the area has not healed in 2 weeks, call the office for follow-up appointment, or sooner if any problems arise.  -Monitor for any signs/symptoms of infection. Call the office immediately if any occur or go directly to the emergency room. Call with any questions/concerns.        Corinna Gab, DPM [facility] / [facility]                   01/14/2023

## 2023-01-15 ENCOUNTER — Telehealth: Payer: Self-pay | Admitting: Neurology

## 2023-01-15 ENCOUNTER — Other Ambulatory Visit: Payer: Self-pay | Admitting: Family Medicine

## 2023-01-15 DIAGNOSIS — E782 Mixed hyperlipidemia: Secondary | ICD-10-CM

## 2023-01-15 NOTE — Telephone Encounter (Signed)
NPSG- Medicare/Tricare no auth req. Sent patient a Wellsite geologist.

## 2023-01-24 ENCOUNTER — Ambulatory Visit (INDEPENDENT_AMBULATORY_CARE_PROVIDER_SITE_OTHER): Payer: Medicare Other | Admitting: Physician Assistant

## 2023-01-24 ENCOUNTER — Encounter: Payer: Self-pay | Admitting: Physician Assistant

## 2023-01-24 DIAGNOSIS — M7051 Other bursitis of knee, right knee: Secondary | ICD-10-CM | POA: Diagnosis not present

## 2023-01-24 DIAGNOSIS — Z9889 Other specified postprocedural states: Secondary | ICD-10-CM

## 2023-01-24 MED ORDER — LIDOCAINE HCL 1 % IJ SOLN
1.0000 mL | INTRAMUSCULAR | Status: AC | PRN
  Administered 2023-01-24: 1 mL

## 2023-01-24 MED ORDER — METHYLPREDNISOLONE ACETATE 40 MG/ML IJ SUSP
40.0000 mg | INTRAMUSCULAR | Status: AC | PRN
  Administered 2023-01-24: 40 mg via INTRAMUSCULAR

## 2023-01-24 NOTE — Progress Notes (Signed)
HPI: Margaret White returns today for follow-up status post right knee arthroscopy 11/08/2022.  She states overall the knee is doing much better.  She still unable to kneel put any weight on the anterior aspect of the knee samples she gives that she has difficulty getting in the bed cleaning and gardening.  She has no mechanical symptoms of the knee.  Has no pain with ambulation.  Physical exam: Right knee excellent range of motion.  No abnormal warmth erythema port sites well-healed.  Calf supple nontender.  No tenderness along medial lateral joint line.  Tenderness over the right pes anserinus region.  Pression: Status post right knee arthroscopy 11/08/2022 Knee pes anserinus bursitis   Procedure Note  Patient: Margaret White             Date of Birth: 01-11-1951           MRN: [id number]             Visit Date: 01/24/2023  Procedures: Visit Diagnoses:  1. Pes anserinus bursitis of right knee   2. Status post arthroscopic surgery of right knee     Trigger Point Inj  Date/Time: 01/24/2023 2:09 PM  Performed by: Kirtland Bouchard, PA-C Authorized by: Kirtland Bouchard, PA-C   Indications:  Pain and therapeutic Total # of Trigger Points:  1 Location: lower extremity   Medications #1:  1 mL lidocaine 1 %; 40 mg methylPREDNISolone acetate 40 MG/ML  Plan: She will apply Voltaren gel over the pes anserinus area up to 4 g 4 times daily.  Follow-up with [place] as needed.  Questions encouraged and answered at length.

## 2023-02-05 ENCOUNTER — Telehealth: Payer: Self-pay | Admitting: Family Medicine

## 2023-02-05 DIAGNOSIS — L853 Xerosis cutis: Secondary | ICD-10-CM | POA: Diagnosis not present

## 2023-02-05 NOTE — Telephone Encounter (Signed)
Contacted Royal Piedra to schedule their annual wellness visit. Appointment made for 02/18/2023.  Gabriel [facility] AWV TEAM Direct Dial [phone]

## 2023-02-18 ENCOUNTER — Ambulatory Visit (INDEPENDENT_AMBULATORY_CARE_PROVIDER_SITE_OTHER): Payer: Medicare Other

## 2023-02-18 VITALS — Wt 172.0 lb

## 2023-02-18 DIAGNOSIS — Z0000 Encounter for general adult medical examination without abnormal findings: Secondary | ICD-10-CM

## 2023-02-18 NOTE — Progress Notes (Signed)
I connected with  Royal Piedra on 02/18/23 by a audio enabled telemedicine application and verified that I am speaking with the correct person using two identifiers.  Patient Location: Home  Provider Location: Office/Clinic  I discussed the limitations of evaluation and management by telemedicine. The patient expressed understanding and agreed to proceed.   Subjective:   Margaret White is a 72 y.o. female who presents for Medicare Annual (Subsequent) preventive examination.  Review of Systems     Cardiac Risk Factors include: advanced age (>85men, >74 women);hypertension;dyslipidemia     Objective:    Today's Vitals   02/18/23 1201  Weight: 172 lb (78 kg)   Body mass index is 29.99 kg/m.     02/18/2023   12:07 PM 04/09/2022   12:48 PM 02/16/2022    8:48 AM 02/09/2021    8:40 AM 10/27/2020    9:25 AM 09/26/2020    3:45 PM 01/05/2020    1:02 PM  Advanced Directives  Does Patient Have a Medical Advance Directive? Yes No Yes No No No No  Type of Estate agent of [place];Living will  Healthcare Power of Attorney      Copy of Healthcare Power of Attorney in Chart? No - copy requested  No - copy requested      Would patient like information on creating a medical advance directive?  No - Patient declined  No - Patient declined No - Patient declined No - Patient declined Yes (MAU/Ambulatory/Procedural Areas - Information given)    Current Medications (verified) Outpatient Encounter Medications as of 02/18/2023  Medication Sig   amLODipine (NORVASC) 5 MG tablet Take 1 tablet (5 mg total) by mouth daily.   calcium carbonate (OS-CAL) 600 MG TABS tablet Take 600 mg by mouth daily with breakfast.   DULoxetine (CYMBALTA) 60 MG capsule TAKE 1 CAPSULE DAILY (REPLACING 30 MG PRESCRIPTION)   esomeprazole (NEXIUM) 40 MG capsule Take 1 capsule (40 mg total) by mouth 2 (two) times daily before a meal.   ezetimibe (ZETIA) 10 MG tablet TAKE 1 TABLET DAILY   FLUoxetine  (PROZAC) 10 MG tablet TAKE 1 TABLET DAILY   fluticasone (FLONASE) 50 MCG/ACT nasal spray USE 1 SPRAY IN EACH NOSTRIL DAILY AS NEEDED FOR ALLERGIES   gabapentin (NEURONTIN) 300 MG capsule TAKE 1 CAPSULE THREE TIMES A DAY   levocetirizine (XYZAL) 5 MG tablet Take 5 mg by mouth every evening.   Polyvinyl Alcohol-Povidone PF (REFRESH) 1.4-0.6 % SOLN Place 1 drop into both eyes daily as needed (Dry eye).   triamcinolone cream (KENALOG) 0.1 %    No facility-administered encounter medications on file as of 02/18/2023.    Allergies (verified) Codeine and Sulfa antibiotics   History: Past Medical History:  Diagnosis Date   Allergic rhinitis    Allergy    Anxiety    Asthma    Avascular necrosis of hip, right (HCC) 03/24/2018   Cataract    Chronic cluster headache    Chronic sinusitis    Diabetes mellitus type 2, diet-controlled (HCC)    per pt currently no taking metformin, followed by pcp   Eczema    Essential hypertension    cardiologsit-- dr dr Dulce Sellar   Family history of abdominal aortic aneurysm (AAA) 07/03/2022   brother   Fatty liver    Fibromyalgia    GERD (gastroesophageal reflux disease)    Hepatic steatosis 10/14/2017   Hiatal hernia    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 Shallow disc bulging without significant neuroforaminal stenosis.   SUI (stress urinary incontinence, female)    Vitamin B 12 deficiency    Vitamin D deficiency    Past Surgical History:  Procedure Laterality Date   ABDOMINOPLASTY  2006   ANAL FISSURE REPAIR  2006   BIOPSY  10/27/2020   Procedure: BIOPSY;  Surgeon: Meridee Score Netty Starring., MD;  Location: [facility] ENDOSCOPY;  Service: Gastroenterology;;   BLADDER SURGERY  1984   BREAST BIOPSY Right     benign     CARPAL TUNNEL RELEASE Bilateral 1989   CATARACT EXTRACTION W/ INTRAOCULAR LENS  IMPLANT, BILATERAL  2017   COLONOSCOPY     COLONOSCOPY W/ POLYPECTOMY  2017   ECTOPIC PREGNANCY SURGERY  1979   ESOPHAGOGASTRODUODENOSCOPY (EGD) WITH PROPOFOL N/A 10/27/2020   Procedure: ESOPHAGOGASTRODUODENOSCOPY (EGD) WITH PROPOFOL;  Surgeon: Lemar Lofty., MD;  Location[facility] END[facility]COPY;  Service: Gastroenterology;  Laterality: N/A;   EYE SURGERY  child    x4  1957-1960   INCONTINENCE SURGERY     NASAL SEPTUM SURGERY  1987   PUBOVAGINAL SLING N/A 03/24/2019   Procedure: CYSTOSCOPY  Leonides Grills;  Surgeon: Alfredo Martinez, MD;  Location: [facility];  Service: Urology;  Laterality: N/A;   REVISION TOTAL HIP ARTHROPLASTY Right    RHINOPLASTY  2007   TOTAL ABDOMINAL HYSTERECTOMY W/ BILATERAL SALPINGOOPHORECTOMY  1984   w/  Marshall-Marchetti (bladder tack suspension)   TOTAL HIP ARTHROPLASTY Right 04/11/2018   Procedure: RIGHT TOTAL HIP ARTHROPLASTY ANTERIOR APPROACH;  Surgeon: Kathryne Hitch, MD;  Location: [facility] ORS;  Service: Orthopedics;  Laterality: Right;   ULNAR NERVE TRANSPOSITION Left 1990s   UPPER ESOPHAGEAL ENDOSCOPIC ULTRASOUND (EUS) N/A 10/27/2020   Procedure: UPPER ESOPHAGEAL ENDOSCOPIC ULTRASOUND (EUS);  Surgeon: Lemar Lofty., MD;  Location: [facility] ENDOSCOPY;  Service: Gastroenterology;  Laterality: N/A;   Family History  Problem Relation Age of Onset   Hypertension Mother    Congestive Heart Failure Mother    Multiple myeloma Mother  Heart disease Father    Prostate cancer Father    Emphysema Sister    Heart disease Brother    Other Brother        brain tumor   Heart disease Maternal Grandmother    Emphysema Maternal Grandmother    CAD Maternal Grandfather    Heart disease Maternal Grandfather    Cancer Paternal Grandmother        type unknown, mets   Alcoholism Paternal Grandfather    Colon cancer Neg Hx    Esophageal cancer Neg Hx    Stomach cancer Neg Hx    Rectal cancer Neg Hx    Social History   Socioeconomic History   Marital status: Married    Spouse name: Not on file   Number of children: 4   Years of education: Not on file   Highest education level: Not on file  Occupational History   Occupation: TEACHER    Employer: [employer]    Comment: retired  Tobacco Use   Smoking status: Never   Smokeless tobacco: Never  Vaping Use   Vaping Use: Never used  Substance and Sexual Activity   Alcohol use: No   Drug use: Never   Sexual activity: Yes    Birth control/protection: Surgical  Other Topics Concern   Not on file  Social History Narrative   Not on file   Social Determinants of Health   Financial Resource Strain: Low Risk  (02/18/2023)   Overall Financial Resource Strain (CARDIA)    Difficulty of Paying Living Expenses: Not hard  at all  Food Insecurity: No Food Insecurity (02/18/2023)   Hunger Vital Sign    Worried About Running Out of Food in the Last Year: Never true    Ran Out of Food in the Last Year: Never true  Transportation Needs: No Transportation Needs (02/18/2023)   PRAPARE - Administrator, Civil Service (Medical): No    Lack of Transportation (Non-Medical): No  Physical Activity: Inactive (02/18/2023)   Exercise Vital Sign    Days of Exercise per Week: 0 days    Minutes of Exercise per Session: 0 min  Stress: No Stress Concern Present (02/18/2023)   [employer] of Occupational Health - Occupational Stress Questionnaire    Feeling of Stress : Not at all  Social Connections: Moderately Integrated (02/18/2023)   Social Connection and Isolation Panel [NHANES]    Frequency of Communication with Friends and Family: More than three times a week    Frequency of Social Gatherings with Friends and Family: More than three times a week    Attends Religious Services: More than 4 times per year    Active Member of [employer] or Organizations: No    Attends Engineer, structural: Never    Marital Status: Married    Tobacco Counseling Counseling given: Not Answered   Clinical Intake:  Pre-visit preparation completed: Yes  Pain : No/denies pain     BMI - recorded: 29.99 Nutritional Status: BMI 25 -29 Overweight Nutritional Risks: None Diabetes: No  How often do you need to have someone help you when you read instructions, pamphlets, or other written materials from your doctor or pharmacy?: 1 - Never  Diabetic?no  Interpreter Needed?: No  Information entered by :: Lanier Ensign, LPN   Activities of Daily Living    02/18/2023   12:07 PM  In your present state of health, do you have any difficulty performing the following activities:  Hearing? 0  Vision? 0  Difficulty concentrating or making decisions? 0  Walking or climbing stairs? 0  Dressing or bathing? 0  Doing  errands, shopping? 0  Preparing Food and eating ? N  Using the Toilet? N  In the past six months, have you accidently leaked urine? N  Do you have problems with loss of bowel control? N  Managing your Medications? N  Managing your Finances? N  Housekeeping or managing your Housekeeping? N    Patient Care Team: Willow Ora, MD as PCP - General (Family Medicine) Christell Constant, MD as PCP - Cardiology (Cardiology) Janalyn Harder, MD (Inactive) as Consulting Physician (Dermatology) Alfredo Martinez, MD as Consulting Physician (Urology) Tressia Danas, MD as Consulting Physician (Gastroenterology) Albin Felling, OD as Consulting Physician (Optometry)  Indicate any recent Medical Services you may have received from other than [facility] providers in the past year (date may be approximate).     Assessment:   This is a routine wellness examination for Margaret White.  Hearing/Vision screen Hearing Screening - Comments:: Pt denies any hearing issues  Vision Screening - Comments:: Pt follows up with Dr Precious Bard for annual eye exams   Dietary issues and exercise activities discussed: Current Exercise Habits: The patient does not participate in regular exercise at present   Goals Addressed             This Visit's Progress    Patient Stated       Be more active        Depression Screen    02/18/2023   12:05 PM 10/19/2022    9:35 AM 08/28/2022   11:37 AM 08/28/2022   11:36 AM 02/27/2022   12:04 PM 02/27/2022   11:33 AM 02/16/2022    8:47 AM  PHQ 2/9 Scores  PHQ - 2 Score 0 0 0 0 0 0 0    Fall Risk    02/18/2023   12:07 PM 10/19/2022    9:35 AM 08/28/2022   11:35 AM 02/27/2022   11:33 AM 02/16/2022    8:49 AM  Fall Risk   Falls in the past year? 0 0 0 0 0  Number falls in past yr: 0 0 0 0 0  Injury with Fall? 0 0 0 0 0  Risk for fall due to : Impaired vision No Fall Risks No Fall Risks No Fall Risks Impaired vision  Follow up Falls prevention discussed Falls  evaluation completed Falls evaluation completed Falls evaluation completed Falls prevention discussed    FALL RISK PREVENTION PERTAINING TO THE HOME:  Any stairs in or around the home? Yes  If so, are there any without handrails? No  Home free of loose throw rugs in walkways, pet beds, electrical cords, etc? Yes  Adequate lighting in your home to reduce risk of falls? Yes   ASSISTIVE DEVICES UTILIZED TO PREVENT FALLS:  Life alert? No  Use of a cane, walker or w/c? No  Grab bars in the bathroom? Yes  Shower chair or bench in shower? Yes  Elevated toilet seat or a handicapped toilet? No   TIMED UP AND GO:  Was the test performed? No .  Cognitive Function:        02/18/2023   12:08 PM 02/16/2022    8:52 AM 02/09/2021    8:43 AM 01/05/2020    1:03 PM  6CIT Screen  What Year? 0 points 0 points 0 points 0 points  What month? 0 points 0 points 0 points 0 points  What time?  0 points 0 points  0 points  Count back from 20 0 points 0 points 0 points 0 points  Months in reverse 0 points 0 points 0 points 0 points  Repeat phrase 0 points 2 points 0 points 0 points  Total Score 0 points 2 points  0 points    Immunizations Immunization History  Administered Date(s) Administered   Fluad Quad(high Dose 65+) 06/24/2020   Influenza,inj,Quad PF,6+ Mos 08/06/2017, 07/31/2018   Influenza-Unspecified 07/23/2019, 08/07/2021   Pneumococcal Conjugate-13 03/24/2018   Pneumococcal Polysaccharide-23 08/19/2019   Tdap 04/25/2018   Zoster Recombinat (Shingrix) 08/07/2021, 08/30/2021    TDAP status: Up to date  Flu Vaccine status: Due, Education has been provided regarding the importance of this vaccine. Advised may receive this vaccine at local pharmacy or Health Dept. Aware to provide a copy of the vaccination record if obtained from local pharmacy or Health Dept. Verbalized acceptance and understanding.  Pneumococcal vaccine status: Up to date  Covid-19 vaccine status: Declined, Education  has been provided regarding the importance of this vaccine but patient still declined. Advised may receive this vaccine at local pharmacy or Health Dept.or vaccine clinic. Aware to provide a copy of the vaccination record if obtained from local pharmacy or Health Dept. Verbalized acceptance and understanding.  Qualifies for Shingles Vaccine? Yes   Zostavax completed Yes   Shingrix Completed?: Yes  Screening Tests Health Maintenance  Topic Date Due   Zoster Vaccines- Shingrix (2 of 2) 10/25/2021   DEXA SCAN  10/25/2022   Diabetic kidney evaluation - Urine ACR  08/29/2027 (Originally 09/05/1969)   INFLUENZA VACCINE  05/09/2023   Diabetic kidney evaluation - eGFR measurement  08/29/2023   MAMMOGRAM  09/04/2023   Medicare Annual Wellness (AWV)  02/18/2024   DTaP/Tdap/Td (2 - Td or Tdap) 04/25/2028   COLONOSCOPY (Pts 45-11yrs Insurance coverage will need to be confirmed)  08/15/2031   Pneumonia Vaccine 72+ Years old  Completed   Hepatitis C Screening  Completed   HPV VACCINES  Aged Out   COVID-19 Vaccine  Discontinued    Health Maintenance  Health Maintenance Due  Topic Date Due   Zoster Vaccines- Shingrix (2 of 2) 10/25/2021   DEXA SCAN  10/25/2022    Colorectal cancer screening: Type of screening: Colonoscopy. Completed 08/14/21. Repeat every 10 years  Mammogram status: Completed 09/03/22. Repeat every year  Bone Density status: Completed 10/25/17. Results reflect: Bone density results: NORMAL. Repeat every 5 years.   Additional Screening:  Hepatitis C Screening:  Completed 03/31/19  Vision Screening: Recommended annual ophthalmology exams for early detection of glaucoma and other disorders of the eye. Is the patient up to date with their annual eye exam?  Yes  Who is the provider or what is the name of the office in which the patient attends annual eye exams? Dr Precious Bard  If pt is not established with a provider, would they like to be referred to a provider to establish care?  No .   Dental Screening: Recommended annual dental exams for proper oral hygiene  Community Resource Referral / Chronic Care Management: CRR required this visit?  No   CCM required this visit?  No      Plan:     I have personally reviewed and noted the following in the patient's chart:   Medical and social history Use of alcohol, tobacco or illicit drugs  Current medications and supplements including opioid prescriptions. Patient is not currently taking opioid prescriptions. Functional ability and status Nutritional status Physical  activity Advanced directives List of other physicians Hospitalizations, surgeries, and ER visits in previous 12 months Vitals Screenings to include cognitive, depression, and falls Referrals and appointments  In addition, I have reviewed and discussed with patient certain preventive protocols, quality metrics, and best practice recommendations. A written personalized care plan for preventive services as well as general preventive health recommendations were provided to patient.     Marzella Schlein, LPN   1/61/0960   Nurse Notes: none

## 2023-02-18 NOTE — Patient Instructions (Signed)
Margaret White , Thank you for taking time to come for your Medicare Wellness Visit. I appreciate your ongoing commitment to your health goals. Please review the following plan we discussed and let me know if I can assist you in the future.   These are the goals we discussed:  Goals      Patient Stated     Lose weight      Patient Stated     Lose 15 - 20 lbs more      Patient Stated     Be more active         This is a list of the screening recommended for you and due dates:  Health Maintenance  Topic Date Due   Zoster (Shingles) Vaccine (2 of 2) 10/25/2021   DEXA scan (bone density measurement)  10/25/2022   Yearly kidney health urinalysis for diabetes  08/29/2027*   Flu Shot  05/09/2023   Yearly kidney function blood test for diabetes  08/29/2023   Mammogram  09/04/2023   Medicare Annual Wellness Visit  02/18/2024   DTaP/Tdap/Td vaccine (2 - Td or Tdap) 04/25/2028   Colon Cancer Screening  08/15/2031   Pneumonia Vaccine  Completed   Hepatitis C Screening: USPSTF Recommendation to screen - Ages 71-79 yo.  Completed   HPV Vaccine  Aged Out   COVID-19 Vaccine  Discontinued  *Topic was postponed. The date shown is not the original due date.    Advanced directives: Please bring a copy of your health care power of attorney and living will to the office at your convenience.  Conditions/risks identified: be more active   Next appointment: Follow up in one year for your annual wellness visit.   Preventive Care 71 Years and Older, Female  Preventive care refers to lifestyle choices and visits with your health care provider that can promote health and wellness. What does preventive care include? A yearly physical exam. This is also called an annual well check. Dental exams once or twice a year. Routine eye exams. Ask your health care provider how often you should have your eyes checked. Personal lifestyle choices, including: Daily care of your teeth and gums. Regular physical  activity. Eating a healthy diet. Avoiding tobacco and drug use. Limiting alcohol use. Practicing safe sex. Taking low doses of aspirin every day. Taking vitamin and mineral supplements as recommended by your health care provider. What happens during an annual well check? The services and screenings done by your health care provider during your annual well check will depend on your age, overall health, lifestyle risk factors, and family history of disease. Counseling  Your health care provider may ask you questions about your: Alcohol use. Tobacco use. Drug use. Emotional well-being. Home and relationship well-being. Sexual activity. Eating habits. History of falls. Memory and ability to understand (cognition). Work and work Astronomer. Screening  You may have the following tests or measurements: Height, weight, and BMI. Blood pressure. Lipid and cholesterol levels. These may be checked every 5 years, or more frequently if you are over 23 years old. Skin check. Lung cancer screening. You may have this screening every year starting at age 39 if you have a 30-pack-year history of smoking and currently smoke or have quit within the past 15 years. Fecal occult blood test (FOBT) of the stool. You may have this test every year starting at age 32. Flexible sigmoidoscopy or colonoscopy. You may have a sigmoidoscopy every 5 years or a colonoscopy every 10 years starting at age  50. Prostate cancer screening. Recommendations will vary depending on your family history and other risks. Hepatitis C blood test. Hepatitis B blood test. Sexually transmitted disease (STD) testing. Diabetes screening. This is done by checking your blood sugar (glucose) after you have not eaten for a while (fasting). You may have this done every 1-3 years. Abdominal aortic aneurysm (AAA) screening. You may need this if you are a current or former smoker. Osteoporosis. You may be screened starting at age 54 if you are  at high risk. Talk with your health care provider about your test results, treatment options, and if necessary, the need for more tests. Vaccines  Your health care provider may recommend certain vaccines, such as: Influenza vaccine. This is recommended every year. Tetanus, diphtheria, and acellular pertussis (Tdap, Td) vaccine. You may need a Td booster every 10 years. Zoster vaccine. You may need this after age 66. Pneumococcal 13-valent conjugate (PCV13) vaccine. One dose is recommended after age 52. Pneumococcal polysaccharide (PPSV23) vaccine. One dose is recommended after age 71. Talk to your health care provider about which screenings and vaccines you need and how often you need them. This information is not intended to replace advice given to you by your health care provider. Make sure you discuss any questions you have with your health care provider. Document Released: 10/21/2015 Document Revised: 06/13/2016 Document Reviewed: 07/26/2015 Elsevier Interactive Patient Education  2017 [place] Prevention in the Home Falls can cause injuries. They can happen to people of all ages. There are many things you can do to make your home safe and to help prevent falls. What can I do on the outside of my home? Regularly fix the edges of walkways and driveways and fix any cracks. Remove anything that might make you trip as you walk through a door, such as a raised step or threshold. Trim any bushes or trees on the path to your home. Use bright outdoor lighting. Clear any walking paths of anything that might make someone trip, such as rocks or tools. Regularly check to see if handrails are loose or broken. Make sure that both sides of any steps have handrails. Any raised decks and porches should have guardrails on the edges. Have any leaves, snow, or ice cleared regularly. Use sand or salt on walking paths during winter. Clean up any spills in your garage right away. This includes oil  or grease spills. What can I do in the bathroom? Use night lights. Install grab bars by the toilet and in the tub and shower. Do not use towel bars as grab bars. Use non-skid mats or decals in the tub or shower. If you need to sit down in the shower, use a plastic, non-slip stool. Keep the floor dry. Clean up any water that spills on the floor as soon as it happens. Remove soap buildup in the tub or shower regularly. Attach bath mats securely with double-sided non-slip rug tape. Do not have throw rugs and other things on the floor that can make you trip. What can I do in the bedroom? Use night lights. Make sure that you have a light by your bed that is easy to reach. Do not use any sheets or blankets that are too big for your bed. They should not hang down onto the floor. Have a firm chair that has side arms. You can use this for support while you get dressed. Do not have throw rugs and other things on the floor that can make you  trip. What can I do in the kitchen? Clean up any spills right away. Avoid walking on wet floors. Keep items that you use a lot in easy-to-reach places. If you need to reach something above you, use a strong step stool that has a grab bar. Keep electrical cords out of the way. Do not use floor polish or wax that makes floors slippery. If you must use wax, use non-skid floor wax. Do not have throw rugs and other things on the floor that can make you trip. What can I do with my stairs? Do not leave any items on the stairs. Make sure that there are handrails on both sides of the stairs and use them. Fix handrails that are broken or loose. Make sure that handrails are as long as the stairways. Check any carpeting to make sure that it is firmly attached to the stairs. Fix any carpet that is loose or worn. Avoid having throw rugs at the top or bottom of the stairs. If you do have throw rugs, attach them to the floor with carpet tape. Make sure that you have a light  switch at the top of the stairs and the bottom of the stairs. If you do not have them, ask someone to add them for you. What else can I do to help prevent falls? Wear shoes that: Do not have high heels. Have rubber bottoms. Are comfortable and fit you well. Are closed at the toe. Do not wear sandals. If you use a stepladder: Make sure that it is fully opened. Do not climb a closed stepladder. Make sure that both sides of the stepladder are locked into place. Ask someone to hold it for you, if possible. Clearly mark and make sure that you can see: Any grab bars or handrails. First and last steps. Where the edge of each step is. Use tools that help you move around (mobility aids) if they are needed. These include: Canes. Walkers. Scooters. Crutches. Turn on the lights when you go into a dark area. Replace any light bulbs as soon as they burn out. Set up your furniture so you have a clear path. Avoid moving your furniture around. If any of your floors are uneven, fix them. If there are any pets around you, be aware of where they are. Review your medicines with your doctor. Some medicines can make you feel dizzy. This can increase your chance of falling. Ask your doctor what other things that you can do to help prevent falls. This information is not intended to replace advice given to you by your health care provider. Make sure you discuss any questions you have with your health care provider. Document Released: 07/21/2009 Document Revised: 03/01/2016 Document Reviewed: 10/29/2014 Elsevier Interactive Patient Education  2017 [employer].

## 2023-02-22 ENCOUNTER — Ambulatory Visit: Payer: Medicare Other | Admitting: Internal Medicine

## 2023-03-04 ENCOUNTER — Ambulatory Visit (INDEPENDENT_AMBULATORY_CARE_PROVIDER_SITE_OTHER): Payer: Medicare Other | Admitting: Neurology

## 2023-03-04 DIAGNOSIS — G4713 Recurrent hypersomnia: Secondary | ICD-10-CM

## 2023-03-04 DIAGNOSIS — G4761 Periodic limb movement disorder: Secondary | ICD-10-CM

## 2023-03-04 DIAGNOSIS — G4733 Obstructive sleep apnea (adult) (pediatric): Secondary | ICD-10-CM

## 2023-03-04 DIAGNOSIS — F515 Nightmare disorder: Secondary | ICD-10-CM

## 2023-03-04 DIAGNOSIS — R442 Other hallucinations: Secondary | ICD-10-CM

## 2023-03-04 DIAGNOSIS — G4753 Recurrent isolated sleep paralysis: Secondary | ICD-10-CM

## 2023-03-04 DIAGNOSIS — G44019 Episodic cluster headache, not intractable: Secondary | ICD-10-CM

## 2023-03-05 ENCOUNTER — Ambulatory Visit (INDEPENDENT_AMBULATORY_CARE_PROVIDER_SITE_OTHER): Payer: Medicare Other | Admitting: Family Medicine

## 2023-03-05 ENCOUNTER — Encounter: Payer: Self-pay | Admitting: Family Medicine

## 2023-03-05 VITALS — BP 148/90 | HR 77 | Temp 98.1°F | Ht 63.5 in | Wt 174.2 lb

## 2023-03-05 DIAGNOSIS — R442 Other hallucinations: Secondary | ICD-10-CM

## 2023-03-05 DIAGNOSIS — G4733 Obstructive sleep apnea (adult) (pediatric): Secondary | ICD-10-CM | POA: Diagnosis not present

## 2023-03-05 DIAGNOSIS — Z780 Asymptomatic menopausal state: Secondary | ICD-10-CM | POA: Diagnosis not present

## 2023-03-05 DIAGNOSIS — K219 Gastro-esophageal reflux disease without esophagitis: Secondary | ICD-10-CM | POA: Diagnosis not present

## 2023-03-05 DIAGNOSIS — D126 Benign neoplasm of colon, unspecified: Secondary | ICD-10-CM | POA: Diagnosis not present

## 2023-03-05 DIAGNOSIS — G4713 Recurrent hypersomnia: Secondary | ICD-10-CM

## 2023-03-05 DIAGNOSIS — I10 Essential (primary) hypertension: Secondary | ICD-10-CM | POA: Diagnosis not present

## 2023-03-05 DIAGNOSIS — F3342 Major depressive disorder, recurrent, in full remission: Secondary | ICD-10-CM

## 2023-03-05 DIAGNOSIS — M797 Fibromyalgia: Secondary | ICD-10-CM | POA: Diagnosis not present

## 2023-03-05 DIAGNOSIS — E559 Vitamin D deficiency, unspecified: Secondary | ICD-10-CM | POA: Diagnosis not present

## 2023-03-05 DIAGNOSIS — R7303 Prediabetes: Secondary | ICD-10-CM

## 2023-03-05 DIAGNOSIS — E538 Deficiency of other specified B group vitamins: Secondary | ICD-10-CM

## 2023-03-05 DIAGNOSIS — E782 Mixed hyperlipidemia: Secondary | ICD-10-CM

## 2023-03-05 LAB — COMPREHENSIVE METABOLIC PANEL
ALT: 28 U/L (ref 0–35)
AST: 23 U/L (ref 0–37)
Albumin: 4.3 g/dL (ref 3.5–5.2)
Alkaline Phosphatase: 89 U/L (ref 39–117)
BUN: 17 mg/dL (ref 6–23)
CO2: 27 mEq/L (ref 19–32)
Calcium: 9.5 mg/dL (ref 8.4–10.5)
Chloride: 102 mEq/L (ref 96–112)
Creatinine, Ser: 1.14 mg/dL (ref 0.40–1.20)
GFR: 48.78 mL/min — ABNORMAL LOW (ref >60.00)
Glucose, Bld: 98 mg/dL (ref 70–99)
Potassium: 4 mEq/L (ref 3.5–5.1)
Sodium: 141 mEq/L (ref 135–145)
Total Bilirubin: 0.4 mg/dL (ref 0.2–1.2)
Total Protein: 6.9 g/dL (ref 6.0–8.3)

## 2023-03-05 LAB — CBC WITH DIFFERENTIAL/PLATELET
Basophils Absolute: 0.1 10*3/uL (ref 0.0–0.1)
Basophils Relative: 0.9 % (ref 0.0–3.0)
Eosinophils Absolute: 0.3 10*3/uL (ref 0.0–0.7)
Eosinophils Relative: 5.1 % — ABNORMAL HIGH (ref 0.0–5.0)
HCT: 41.7 % (ref 36.0–46.0)
Hemoglobin: 13.5 g/dL (ref 12.0–15.0)
Lymphocytes Relative: 32.9 % (ref 12.0–46.0)
Lymphs Abs: 2 10*3/uL (ref 0.7–4.0)
MCHC: 32.4 g/dL (ref 30.0–36.0)
MCV: 87.6 fl (ref 78.0–100.0)
Monocytes Absolute: 0.5 10*3/uL (ref 0.1–1.0)
Monocytes Relative: 8.4 % (ref 3.0–12.0)
Neutro Abs: 3.1 10*3/uL (ref 1.4–7.7)
Neutrophils Relative %: 52.7 % (ref 43.0–77.0)
Platelets: 331 10*3/uL (ref 150.0–400.0)
RBC: 4.76 Mil/uL (ref 3.87–5.11)
RDW: 14.8 % (ref 11.5–15.5)
WBC: 5.9 10*3/uL (ref 4.0–10.5)

## 2023-03-05 LAB — TSH: TSH: 2.7 u[IU]/mL (ref 0.35–5.50)

## 2023-03-05 LAB — LIPID PANEL
Cholesterol: 196 mg/dL (ref 0–200)
HDL: 69.5 mg/dL (ref >39.00)
NonHDL: 126.19
Total CHOL/HDL Ratio: 3
Triglycerides: 220 mg/dL — ABNORMAL HIGH (ref 0.0–149.0)
VLDL: 44 mg/dL — ABNORMAL HIGH (ref 0.0–40.0)

## 2023-03-05 LAB — VITAMIN D 25 HYDROXY (VIT D DEFICIENCY, FRACTURES): VITD: 22.83 ng/mL — ABNORMAL LOW (ref 30.00–100.00)

## 2023-03-05 LAB — HEMOGLOBIN A1C: Hgb A1c MFr Bld: 6.2 % (ref 4.6–6.5)

## 2023-03-05 LAB — VITAMIN B12: Vitamin B-12: 270 pg/mL (ref 211–911)

## 2023-03-05 LAB — LDL CHOLESTEROL, DIRECT: Direct LDL: 97 mg/dL

## 2023-03-05 MED ORDER — FLUOXETINE HCL 10 MG PO TABS: 10.0000 mg | ORAL_TABLET | Freq: Every day | ORAL | 3 refills | Status: DC

## 2023-03-05 MED ORDER — AMLODIPINE BESYLATE 10 MG PO TABS: 5.0000 mg | ORAL_TABLET | Freq: Every day | ORAL | 3 refills | Status: DC

## 2023-03-07 ENCOUNTER — Encounter: Payer: Self-pay | Admitting: Family Medicine

## 2023-03-07 NOTE — Progress Notes (Signed)
Subjective  Chief Complaint  Patient presents with   Annual Exam    Pt here for Annual Exam and is not currently fasting     HPI: Margaret White is a 72 y.o. female who presents to [facility] [place] today for a Female Wellness Visit. She also has the concerns and/or needs as listed above in the chief complaint. These will be addressed in addition to the Health Maintenance Visit.   Wellness Visit: annual visit with health maintenance review and exam without Pap  Health maintenance: Screens are current.  Last bone density 5 years ago normal, due for recheck.  Immunizations are current. Chronic disease f/u and/or acute problem visit: (deemed necessary to be done in addition to the wellness visit): Medical problems as listed below.  Unfortunately, patient has had a stressful year.  But has become more disabled with worsening cognitive function and status post stroke.  Also dealing with multiple other family stressors.  Fortunately, she reports that her mood is holding steady on her Prozac.  Fibromyalgia is intermittently active.  Blood pressure is up today likely related to stress.  She continues to try to eat well although her diet has changed due to stressors. Sleep symptoms: Reviewed neurology consultation.  Workup ongoing.  Sleep testing done last night awaiting results.  Narcolepsy screening ordered.  She has follow-up. Due for blood work including B12 and D.  Following lipid levels and A1c.  She denies symptoms of hypoglycemia.  She is compliant with her medications.  Assessment  1. Fibromyalgia   2. Essential hypertension   3. Recurrent major depressive disorder, in full remission (HCC) Chronic  4. Gastroesophageal reflux disease without esophagitis   5. Mixed hyperlipidemia   6. OSA (obstructive sleep apnea)   7. Prediabetes   8. Vitamin B12 deficiency   9. Vitamin D deficiency   10. Tubular adenoma of colon   11. Asymptomatic menopausal state   12.  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 Included topics are cancer screening recommendations, ways to keep healthy (see AVS) including dietary and exercise recommendations, regular eye and dental care, use of seat belts, and avoidance of moderate alcohol use and tobacco use.  BMI: discussed patient's BMI and encouraged positive lifestyle modifications to help get to or maintain a target BMI. HM needs and immunizations were addressed and ordered. See below for orders. See HM and immunization section for updates. Routine labs and screening tests ordered including cmp, cbc and lipids where appropriate. Discussed recommendations regarding Vit D and calcium supplementation (see AVS)  Chronic disease management visit and/or acute problem visit: Multiple medical problems as listed above.  Will increase amlodipine to 10 mg to help get blood pressure under better control.  No other medication changes today.  Check lab work.  Will follow-up by neurology results.  Monitor mood.  Counseling done. I spent a total of 43 minutes for this patient encounter. Time spent included preparation, face-to-face counseling with the patient and coordination of care, review of chart and records, and documentation of the encounter.  Follow up: 3 months recheck blood pressure Orders Placed This Encounter  Procedures   DG Bone Density   CBC with Differential/Platelet   Comprehensive metabolic panel   Lipid panel   TSH   Hemoglobin A1c   VITAMIN D 25 Hydroxy (Vit-D Deficiency, Fractures)   Vitamin B12  LDL cholesterol, direct   Meds ordered this encounter  Medications   amLODipine (NORVASC) 10 MG tablet    Sig: Take 0.5 tablets (5 mg total) by mouth daily.    Dispense:  90 tablet    Refill:  3   FLUoxetine (PROZAC) 10 MG tablet    Sig: Take 1 tablet (10 mg total) by mouth  daily.    Dispense:  90 tablet    Refill:  3      Body mass index is 30.37 kg/m. Wt Readings from Last 3 Encounters:  03/05/23 174 lb 3.2 oz (79 kg)  02/18/23 172 lb (78 kg)  01/02/23 172 lb (78 kg)     Patient Active Problem List   Diagnosis Date Noted   Prediabetes 02/09/2021    Priority: High   Tubular adenoma of colon 08/19/2019    Priority: High    Tubular adenoma 2017; f/u colonoscopy 08/2021: tubular adenoma and sessile polyps; repeat 5 years. Dr. Carmelia Roller, [facility]    Statin intolerance 03/25/2018    Priority: High   GERD (gastroesophageal reflux disease) 10/14/2017    Priority: High   NASH (nonalcoholic steatohepatitis) 10/14/2017    Priority: High    Advanced hepatic steatosis by ultrasound; followed by Dr. Orvan Falconer    OSA (obstructive sleep apnea) 10/14/2017    Priority: High   Fibromyalgia 10/14/2017    Priority: High   Obesity (BMI 30-39.9) 10/14/2017    Priority: High   Mixed hyperlipidemia 10/14/2017    Priority: High   Essential hypertension 06/26/2017    Priority: High   Incontinence without sensory awareness 05/08/2018    Priority: Medium     Followed by [facility]     Mixed incontinence 05/08/2018    Priority: Medium     Followed by [facility]     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 Shallow disc bulging without significant neuroforaminal stenosis.    IBS (irritable bowel syndrome) 10/14/2017    Priority: Medium    Recurrent major depressive disorder, in full remission (HCC) 06/26/2017     Priority: Medium    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    Priority: Low   Allergic rhinitis 10/14/2017    Priority: Low   History of cluster headache 10/14/2017    Priority: Low   Chronic maxillary sinusitis 10/14/2017    Priority: Low   Vitamin B12 deficiency 10/14/2017    Priority: Low   Vitamin D deficiency 10/14/2017    Priority: Low   Female cystocele 10/14/2017    Priority: Low   Primary osteoarthritis of both hands 07/10/2017    Priority: Low    Last Assessment & Plan:  Try mobic  Stay hydrated  Monitor renal panel    Hypersomnia, periodic 01/02/2023   Hypnapompic hallucinations 01/02/2023   Sleep paralysis, recurrent isolated 01/02/2023   Nightmares 01/02/2023   Episodic cluster headache, not intractable 01/02/2023   Heart murmur, systolic 08/27/2022   Family history of abdominal aortic aneurysm (AAA) 07/03/2022    brother    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 She has been experiencing worsening shortness of breath for about two weeks, which she describes as a reduction in her ability to exercise. She can walk as far as she wants without too much shortness of breath, but finds it difficult to catch her breath when she stops. She has also noticed a decrease in her general energy level and motivation over the last two to four weeks. She first recognized the severity of her dyspnea when she donated blood at the [facility] two weeks ago. She has been taking iron supplements with little improvement. Her globin level at the time of the blood draw was 14. She denies having fevers. She had a rash on her right side a month ago that has since resolved, and she was bitten by a tick one to two months ago on the back of her head. She has a chronic  cough that produces chunky mucus. Her brother was recently diagnosed with an abdominal aortic aneurysm. No medications, allergies, social history, physical examination, laboratory data, course, imaging data, or other studies are mentioned in the patient summary.     Health Maintenance  Topic Date Due   DEXA SCAN  10/25/2022   INFLUENZA VACCINE  05/09/2023   MAMMOGRAM  09/04/2023   Medicare Annual Wellness (AWV)  02/18/2024   Colonoscopy  08/14/2026   DTaP/Tdap/Td (2 - Td or Tdap) 04/25/2028   Pneumonia Vaccine 1+ Years old  Completed   Hepatitis C Screening  Completed   HPV VACCINES  Aged Out   COVID-19 Vaccine  Discontinued   Zoster Vaccines- Shingrix  Discontinued   Immunization History  Administered Date(s) Administered   Fluad Quad(high Dose 65+) 06/24/2020   Influenza,inj,Quad PF,6+ Mos 08/06/2017, 07/31/2018   Influenza-Unspecified 07/23/2019, 08/07/2021   Pneumococcal Conjugate-13 03/24/2018   Pneumococcal Polysaccharide-23 08/19/2019   Tdap 04/25/2018   Zoster Recombinat (Shingrix) 08/07/2021, 08/30/2021   We updated and reviewed the patient's past history in detail and it is documented below. Allergies: Patient is allergic to codeine and sulfa antibiotics. 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), Cataract, Chronic cluster headache, Chronic sinusitis, Diabetes mellitus type 2, diet-controlled (HCC), Eczema, Essential hypertension, Family history of abdominal aortic aneurysm (AAA) (07/03/2022), Fatty liver, Fibromyalgia, GERD (gastroesophageal reflux disease), Hepatic steatosis (10/14/2017), Hiatal hernia, Hip osteoarthritis (11/05/2017), History of avascular necrosis of capital femoral epiphysis, History of colon polyps, History of hyperthyroidism (2005), History of pneumonia, recurrent, History of seizure (2006), IBS (irritable bowel syndrome), IBS (irritable  bowel syndrome), Mixed hyperlipidemia, Nodule of lower lobe of left lung (10/2018), Ocular rosacea, OSA (obstructive sleep apnea), 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 Past Surgical History Patient  has a past surgical history that includes Rhinoplasty (2007); Carpal tunnel release (Bilateral, 1989); Total hip arthroplasty (Right, 04/11/2018); Colonoscopy w/ polypectomy (2017); Ulnar nerve transposition (Left, 1990s); Bladder surgery (1984); Nasal septum surgery (1987); Ectopic pregnancy surgery (1979); Eye surgery (  child ); Cataract extraction w/ intraocular lens  implant, bilateral (2017); Anal fissure repair (2006); Abdominoplasty (2006); Total abdominal hysterectomy w/ bilateral salpingoophorectomy (1984); Pubovaginal sling (N/A, 03/24/2019); Incontinence surgery; Breast biopsy (Right); Revision total hip arthroplasty (Right); Esophagogastroduodenoscopy (egd) with propofol (N/A, 10/27/2020); Upper esophageal endoscopic ultrasound (eus) (N/A, 10/27/2020); biopsy (10/27/2020); and Colonoscopy. Family History: Patient family history includes Alcoholism in her paternal grandfather; CAD in her maternal grandfather; Cancer in her paternal grandmother; Congestive Heart Failure in her mother; Emphysema in her maternal grandmother and sister; Heart disease in her brother, father, maternal grandfather, and maternal grandmother; Hypertension in her mother; Multiple myeloma in her mother; Other in her brother; Prostate cancer in her father. Social History:  Patient   reports that she has never smoked. She has never used smokeless tobacco. She reports that she does not drink alcohol and does not use drugs.  Review of Systems: Constitutional: Negative for fever malaise or anorexia Cardiovascular: negative for chest pain Respiratory: negative for SOB or persistent cough Gastrointestinal: negative for abdominal pain   Patient Care Team    Relationship Specialty Notifications Start End  Willow Ora, MD PCP - General Family Medicine  08/19/19   Christell Constant, MD PCP - Cardiology Cardiology  08/27/22   Janalyn Harder, MD (Inactive) Consulting Physician Dermatology  05/05/18   Alfredo Martinez, MD Consulting Physician Urology  05/08/18   Tressia Danas, MD Consulting Physician Gastroenterology  08/19/19   Albin Felling, OD Consulting Physician Optometry  01/05/20   Dohmeier, Porfirio Mylar, MD Consulting Physician Neurology  03/05/23     Objective  Vitals: BP (!) 148/90   Pulse 77   Temp 98.1 F (36.7 C)   Ht 5' 3.5" (1.613 m)   Wt 174 lb 3.2 oz (79 kg)   SpO2 95%   BMI 30.37 kg/m  General:  Well developed, well nourished, no acute distress  Psych:  Alert and orientedx3,normal mood and affect HEENT:  Normocephalic, atraumatic, non-icteric sclera,  supple neck without adenopathy, mass or thyromegaly Cardiovascular:  Normal S1, S2, RRR without gallop, rub or murmur Respiratory:  Good breath sounds bilaterally, CTAB with normal respiratory effort Gastrointestinal: normal bowel sounds, soft, non-tender, no noted masses. No HSM MSK: extremities without edema, joints without erythema or swelling   Commons side effects, risks, benefits, and alternatives for medications and treatment plan prescribed today were discussed, and the patient expressed understanding of the given instructions. Patient is instructed to call or message via MyChart if he/she has any questions or concerns regarding our treatment plan. No barriers to understanding were  identified. We discussed Red Flag symptoms and signs in detail. Patient expressed understanding regarding what to do in case of urgent or emergency type symptoms.  Medication list was reconciled, printed and provided to the patient in AVS. Patient instructions and summary information was reviewed with the patient as documented in the AVS. This note was prepared with assistance of Dragon voice recognition software. Occasional wrong-word or sound-a-like substitutions may have occurred due to the inherent limitations of voice recognition software

## 2023-03-07 NOTE — Patient Instructions (Signed)
Please return in 3 months to recheck blood pressure  If you have any questions or concerns, please don't hesitate to send me a message via MyChart or call the office at [phone]. Thank you for visiting with [place] today! It's our pleasure caring for you.

## 2023-03-07 NOTE — Progress Notes (Signed)
See mychart note Dear Ms. Giannelli, Your lab work results mostly look good. Things to watch: your sugar test is higher. Remains in the prediabetic range. Watch your diet as best as you can.  I will monitor your kidney function for you. Your cholesterol is ok.  You need to start taking vitamin B12 daily (OTC) and Vitamin D 4000 units daily to help raise these levels.  Your thyroid and blood counts look fine. Please take care of yourself! Sincerely, Dr. Mardelle Matte

## 2023-03-12 NOTE — Progress Notes (Signed)
Please call patient:please see mychart note; pt has not reviewed it. Please give her the instructions. thanks

## 2023-03-20 ENCOUNTER — Ambulatory Visit (INDEPENDENT_AMBULATORY_CARE_PROVIDER_SITE_OTHER)
Admission: RE | Admit: 2023-03-20 | Discharge: 2023-03-20 | Disposition: A | Discharge status: Home / Self Care | Payer: Medicare Other | Source: Ambulatory Visit | Attending: Family Medicine | Admitting: Family Medicine

## 2023-03-20 DIAGNOSIS — Z780 Asymptomatic menopausal state: Secondary | ICD-10-CM | POA: Diagnosis not present

## 2023-03-21 ENCOUNTER — Ambulatory Visit (INDEPENDENT_AMBULATORY_CARE_PROVIDER_SITE_OTHER): Payer: Medicare Other | Admitting: Orthopaedic Surgery

## 2023-03-21 ENCOUNTER — Encounter: Payer: Self-pay | Admitting: Orthopaedic Surgery

## 2023-03-21 ENCOUNTER — Other Ambulatory Visit: Payer: Medicare Other

## 2023-03-21 DIAGNOSIS — M1711 Unilateral primary osteoarthritis, right knee: Secondary | ICD-10-CM

## 2023-03-21 DIAGNOSIS — G8929 Other chronic pain: Secondary | ICD-10-CM

## 2023-03-21 DIAGNOSIS — M25561 Pain in right knee: Secondary | ICD-10-CM

## 2023-03-21 DIAGNOSIS — Z9889 Other specified postprocedural states: Secondary | ICD-10-CM | POA: Diagnosis not present

## 2023-03-21 NOTE — Progress Notes (Signed)
The patient comes in today with right knee pain, swelling and instability symptoms.  Earlier this year we did perform an arthroscopic surgery on her right knee.  There was a lateral meniscal tear.  The radiologist had dictated there was full-thickness cartilage loss on the lateral compartment but we found intact cartilage.  Her ACL PCL were intact and the medial compartment and patellofemoral joint were all intact.  She is been having some swelling in that knee.  She did have a pes bursa injection in April by Bronson Curb and she said that did great but she still feels like the knee is unstable to her and has been swelling.  On exam she does have a moderate effusion of her right knee today.  I did aspirate 25 cc of clear fluid from the knee and then place a steroid injection in her right knee.  She is going to continue wearing her knee sleeve and I did give her handout for knee conditioning exercises and strengthening.  I do feel that she is still a candidate for trying hyaluronic acid for the knee to calm down some of the pain symptoms hopefully.  We will see if we get this ordered and approved for we will see her back in follow-up hopefully soon to place that injection in her knee.  She did feel better after she walked out today.

## 2023-03-22 NOTE — Procedures (Signed)
[facility] POLYSOMNOGRAPHY  INTERPRETATION REPORT  SPLIT STUDY with dental device    STUDY DATE:  03/04/2023     PATIENT NAME:  Margaret White         DATE OF BIRTH:  06-15-1951  PATIENT ID:  [id number]    TYPE OF STUDY:  PSG  READING PHYSICIAN: Melvyn Novas, MD REFERRED BY:  SCORING TECHNICIAN: Domingo Cocking, RPSGT   HISTORY:  Margaret White is a 72 year-old Female patient who presented on 01-02-2023 with a 10 year history of spells - these are sleep hallucinations, dream enactments and she cannot be easily woken up.  Over 10 years ago she was diagnosed with ODSA and couldn't tolerate CPAP and therefor uses now a mouthguard. The device to be checked today in a SPLIT protocol, first half of the night without and second part with oral device.  She continues to snore loudly.  ADDITIONAL INFORMATION:  The Epworth Sleepiness Scale endorsed at 11 /24 points (scores above or equal to 10 are suggestive of hypersomnolence). FSS endorsed at   /63 points.  Height: 64 in Weight: 172 lb (BMI 29) Neck Size: 15 in  MEDICATIONS: Norvasc, OS-CAL, Cymbalta, Nexium, Zetia, Prozac, Flonase, Neurontin, Xyzal, eye drops  TECHNICAL DESCRIPTION: A registered sleep technologist ( RPSGT)  was in attendance for the duration of the recording.  Data collection, scoring, video monitoring, and reporting were performed in compliance with the AASM Manual for the Scoring of Sleep and Associated Events; (Hypopnea is scored based on the criteria listed in Section VIII D. 1b in the AASM Manual V2.6 using a 4% oxygen desaturation rule or Hypopnea is scored based on the criteria listed in Section VIII D. 1a in the AASM Manual V2.6 using 3% oxygen desaturation and /or arousal rule).   SLEEP CONTINUITY AND SLEEP ARCHITECTURE:  Lights-out was at 22:06: and lights-on at  04:55:, with  6.8 minutes of recording time . Total sleep time (TST) was 177.0 minutes with a decreased sleep efficiency at  43.3%. Sleep latency was normal at 23.0 minutes.  REM sleep latency was decreased at 0.0 minutes. Of the total sleep time, the percentage of stage N1 sleep was 18.1%, stage N2 sleep was 63%, stage N3 sleep was 18.9%, and REM sleep was 0.0%. BODY POSITION:  TST was divided between the following sleep positions: : supine 85 minutes (48%), non-supine 92 minutes (52%); right 57 minutes (32%), left 34 minutes (19%), and prone 00 minutes (0%).  Total supine REM sleep time was 00 minutes (0% of total REM sleep). There were 0 Stage R periods observed on this study night, 49 awakenings (i.e. transitions to Stage W from any sleep stage), and 132 total stage transitions. Wake after sleep onset (WASO) time accounted for 90 minutes.   RESPIRATORY MONITORING:  Based on CMS criteria (using a 4% oxygen desaturation rule for scoring hypopneas), there were 25 apneas (15 obstructive; 3 central apneas; 7 mixed), and 31 hypopneas.  Apnea index was 8.5/h. Hypopnea index was 10.5/h.  The apnea-hypopnea index was 19.0 /h overall (16.8 supine, 0 non-supine; 0.0 REM, 0.0 supine REM). There were 0 respiratory effort-related arousals (RERAs).   Comparing the time with oral device to the time without, the AHI was 21.6 without oral device and 17.8 with oral device- an insignificant difference.    OXIMETRY: Oxyhemoglobin Saturation Nadir during sleep was at  85% from a mean of 91%.  Of the Total sleep time (TST),  hypoxemia (<89%) was present for 43.3 minutes,  or 24.5% of total sleep time.  LIMB MOVEMENTS: There were 99 periodic limb movements of sleep (33.6/h), of which 27 (9.2/h) were associated with an arousal. These limb movements occurred in supine sleep and after the oral appliance was placed. Here was the greatest difference seen: PLMs were actually more frequent during the time the oral device was worn.  AROUSAL: There were 150 arousals in total, for an arousal index of 45 arousals/hour.  Of these, 50 were identified as  respiratory-related arousals (17 /h), 27 were PLM-related arousals (9 /h), and 83 were non-specific arousals (28 /h). There were 0 occurrences of Cheyne Stokes breathing.   Arousals were counted 20 times without dental device versus 29 times with dental device.  The oral device seems to reduce the WASO time form 95 minutes to 70 minutes.  EEG:  PSG EEG was of normal amplitude and frequency, with symmetric manifestation of sleep stages. EKG: The electrocardiogram documented NSR.  The average heart rate during sleep was 72 bpm.  The heart rate during sleep varied between a minimum of 63 and a maximum of  83 bpm. AUDIO and VIDEO:   IMPRESSION: Sleep disordered breathing was present- in form of moderately -severe Obstructive Sleep apnea and was associated with hypoxia and caused many arousals.  The degree of apnea was marginally improved by the wearing of a mouth guard and oxygen desaturation was not influenced at all.  Periodic limb movements (PLMs) were actually worse after the mouth guard was placed.   These PLMs contributed to the high number of arousals. 4) Sleep efficiency was extremely poor and improved a bit under the use of the mouth guard.  Sleep fragmentation was severe- The majority of sleep arousals was related to spontaneous or unphysiological arousals (These can be discomfort or pain related or arousal by external stimuli) but there were many due to apnea and hypopnea and several due to PLMs.    RECOMMENDATIONS:  Treating apnea in a more effective way is important - and I would want the patient's permission to give PAP another trial. This time with medication to help initiate and maintain sleep. I hope this will decrease PLms as well.  My thoughts included flexaril, amitriptyline.    If there is a chronic pain disorder present, I will need this addressed by the PCP or a pain specialist.   Melvyn Novas, MD                  General Information  Name: Lakin, States BMI:  29.99 Physician: Melvyn Novas, MD  ID: [id number] Height: 63.5 in Technician: Domingo Cocking, RPSGT  Sex: Female Weight: 172.0 lb Record: [id number]  Age: 72 [07-21-1951] Date: 03/04/2023    Medical & Medication History    "She has suffered with these events for over 10 yrs. It was so infrequent over the years. Maybe 1-2 times a year. A couple of months ago, she was having a nightmare and she could hear/feel husband trying to wake her up and it wasn't until the third time she was able to come out of it. She said in her sleep, she was biting or kicking but the husband stated she was frozen and just mumbling. This particular event scared her so bad that she wanted to be evaluated .  Last SS >upon indication of Cluster headaches about 8 years ago and she was dx with OSA and started CPAP - but was unable to tolerate nasal pillow, and couldn't tolerate the forehead touched, facial sensitivity. and was  switched to dental device. She uses this sometimes -not all the time. She is a Research scientist (physical sciences), her sister once taped her snoring extremely loud  Norvasc, OS-CAL, Cymbalta, Nexium, Zetia, Prozac, Flonase, Neurontin, Xyzal, eye drops   Sleep Disorder      Comments   Patient arrived for a diagnostic polysomnogram to check the efficacy of her oral appliance. Essentially a SPLIT night recording; first half without her oral appliance, then the second half will be with her oral appliance. Procedure explained and all questions answered. Standard paste setup without complications. Patient slept supine, right, and left. Occasional mild snoring was heard. Respiratory events observed. Oral appliance was inserted at 01:15 AM for comparison. No obvious cardiac arrhythmias noted. PLMS observed, some associated with arousals. Patient had three restroom visits. She had to get up and move her legs a bit and use the restroom.    Lights out: 10:06:26 PM Lights on: 04:55:25 AM   Time Total Supine Side Prone Upright   Recording (TRT) 6h 49.38m 2h 13.54m 4h 35.35m 0h 0.11m 0h 0.58m  Sleep (TST) 2h 57.63m 1h 25.45m 1h 31.37m 0h 0.62m 0h 0.73m   Latency N1 N2 N3 REM Onset Per. Slp. Eff.  Actual 0h 23.3m 0h 26.26m 4h 18.32m 0h 0.70m 0h 23.106m 0h 54.93m 43.28%   Stg Dur Wake N1 N2 N3 REM  Total 209.0 32.0 111.5 33.5 0.0  Supine 48.0 3.5 52.5 29.5 0.0  Side 161.0 28.5 59.0 4.0 0.0  Prone 0.0 0.0 0.0 0.0 0.0  Upright 0.0 0.0 0.0 0.0 0.0   Stg % Wake N1 N2 N3 REM  Total 54.1 18.1 63.0 18.9 0.0  Supine 12.4 2.0 29.7 16.7 0.0  Side 41.7 16.1 33.3 2.3 0.0  Prone 0.0 0.0 0.0 0.0 0.0  Upright 0.0 0.0 0.0 0.0 0.0     Apnea Summary Sub Supine Side Prone Upright  Total 25 Total 25 17 8  0 0    REM 0 0 0 0 0    NREM 25 17 8  0 0  Obs 15 REM 0 0 0 0 0    NREM 15 11 4  0 0  Mix 7 REM 0 0 0 0 0    NREM 7 4 3  0 0  Cen 3 REM 0 0 0 0 0    NREM 3 2 1  0 0   Rera Summary Sub Supine Side Prone Upright  Total 0 Total 0 0 0 0 0    REM 0 0 0 0 0    NREM 0 0 0 0 0   Hypopnea Summary Sub Supine Side Prone Upright  Total 53 Total 53 15 38 0 0    REM 0 0 0 0 0    NREM 53 15 38 0 0   4% Hypopnea Summary Sub Supine Side Prone Upright  Total (4%) 31 Total 31 7 24  0 0    REM 0 0 0 0 0    NREM 31 7 24  0 0     AHI Total Obs Mix Cen  26.44 Apnea 8.47 5.08 2.37 1.02   Hypopnea 17.97 -- -- --  18.98 Hypopnea (4%) 10.51 -- -- --    Total Supine Side Prone Upright  Position AHI 26.44 22.46 30.16 0.00 0.00  REM AHI 0.00   NREM AHI 26.44   Position RDI 26.44 22.46 30.16 0.00 0.00  REM RDI 0.00   NREM RDI 26.44    4% Hypopnea Total Supine Side Prone Upright  Position AHI (4%) 18.98  16.84 20.98 0.00 0.00  REM AHI (4%) 0.00   NREM AHI (4%) 18.98   Position RDI (4%) 18.98 16.84 20.98 0.00 0.00  REM RDI (4%) 0.00   NREM RDI (4%) 18.98    Desaturation Information Threshold: 2% <100% <90% <80% <70% <60% <50% <40%  Supine 107.0 67.0 0.0 0.0 0.0 0.0 0.0  Side 184.0 15.0 0.0 0.0 0.0 0.0 0.0  Prone 0.0 0.0 0.0 0.0 0.0 0.0 0.0   Upright 0.0 0.0 0.0 0.0 0.0 0.0 0.0  Total 291.0 82.0 0.0 0.0 0.0 0.0 0.0  Index 45.2 12.7 0.0 0.0 0.0 0.0 0.0   Threshold: 3% <100% <90% <80% <70% <60% <50% <40%  Supine 74.0 50.0 0.0 0.0 0.0 0.0 0.0  Side 85.0 9.0 0.0 0.0 0.0 0.0 0.0  Prone 0.0 0.0 0.0 0.0 0.0 0.0 0.0  Upright 0.0 0.0 0.0 0.0 0.0 0.0 0.0  Total 159.0 59.0 0.0 0.0 0.0 0.0 0.0  Index 24.7 9.2 0.0 0.0 0.0 0.0 0.0   Threshold: 4% <100% <90% <80% <70% <60% <50% <40%  Supine 49.0 35.0 0.0 0.0 0.0 0.0 0.0  Side 40.0 8.0 0.0 0.0 0.0 0.0 0.0  Prone 0.0 0.0 0.0 0.0 0.0 0.0 0.0  Upright 0.0 0.0 0.0 0.0 0.0 0.0 0.0  Total 89.0 43.0 0.0 0.0 0.0 0.0 0.0  Index 13.8 6.7 0.0 0.0 0.0 0.0 0.0   Threshold: 4% <100% <90% <80% <70% <60% <50% <40%  Supine 49 35 0 0 0 0 0  Side 40 8 0 0 0 0 0  Prone 0 0 0 0 0 0 0  Upright 0 0 0 0 0 0 0  Total 89 43 0 0 0 0 0   Awakening/Arousal Information # of Awakenings 49  Wake after sleep onset 209.15m  Wake after persistent sleep 193.53m   Arousal Assoc. Arousals Index  Apneas 15 5.1  Hypopneas 35 11.9  Leg Movements 39 13.2  Snore 0 0.0  PTT Arousals 0 0.0  Spontaneous 83 28.1  Total 169 57.3  Leg Movement Information PLMS LMs Index  Total LMs during PLMS 99 33.6  LMs w/ Microarousals 27 9.2   LM LMs Index  w/ Microarousal 12 4.1  w/ Awakening 6 2.0  w/ Resp Event 0 0.0  Spontaneous 9 3.1  Total 21 7.1     Desaturation threshold setting: 4% Minimum desaturation setting: 10 seconds SaO2 nadir: 85% The longest event was a 38 sec obstructive Apnea with a minimum SaO2 of 89%. The lowest SaO2 was 86% associated with a 23 sec obstructive Hypopnea. EKG Rates EKG Avg Max Min  Awake 74 91 64  Asleep 72 83 63  EKG Events: N/A The following data were calculated upon a SPLIT protocol assumption, treating the dental device part 2 of the study like a CPAP initiation.   Baseline Sleep Stage Information Baseline start time: 10:06:26 PM Baseline end time: 01:14:46 AM   Time Total  Supine Side Prone Upright  Recording 3h 8.83m 0h 32.86m 2h 36.57m 0h 0.63m 0h 0.22m  Sleep 0h 55.53m 0h 2.43m 0h 53.75m 0h 0.49m 0h 0.52m   Latency N1 N2 N3 REM Onset Per. Slp. Eff.  Actual 0h 23.61m 0h 26.66m 0h 0.18m 0h 0.5m 0h 23.67m 0h 54.1m 29.44%   Stg Dur Wake N1 N2 N3 REM  Total 110.0 14.0 41.5 0.0 0.0  Supine 29.5 1.0 1.5 0.0 0.0  Side 80.5 13.0 40.0 0.0 0.0  Prone 0.0 0.0 0.0 0.0 0.0  Upright 0.0 0.0 0.0 0.0  0.0   Stg % Wake N1 N2 N3 REM  Total 66.5 25.2 74.8 0.0 0.0  Supine 17.8 1.8 2.7 0.0 0.0  Side 48.6 23.4 72.1 0.0 0.0  Prone 0.0 0.0 0.0 0.0 0.0  Upright 0.0 0.0 0.0 0.0 0.0    DENTAL DEVICE replaces CPAP :    Sleep Stage Information CPAP start time: 01:14:46 AM CPAP end time: 04:55:25 AM   Time Total Supine Side Prone Upright  Recording (TRT) 3h 40.74m 1h 41.11m 1h 59.34m 0h 0.74m 0h 0.42m  Sleep (TST) 2h 1.24m 1h 23.63m 0h 38.41m 0h 0.47m 0h 0.36m   Latency N1 N2 N3 REM Onset Per. Slp. Eff.  Actual 0h 9.71m 0h 34.69m 1h 10.67m 0h 0.76m 0h 9.87m 0h 49.78m 55.10%   Stg Dur Wake N1 N2 N3 REM  Total 99.0 18.0 70.0 33.5 0.0  Supine 18.5 2.5 51.0 29.5 0.0  Side 80.5 15.5 19.0 4.0 0.0  Prone 0.0 0.0 0.0 0.0 0.0  Upright 0.0 0.0 0.0 0.0 0.0   Stg % Wake N1 N2 N3 REM  Total 44.9 14.8 57.6 27.6 0.0  Supine 8.4 2.1 42.0 24.3 0.0  Side 36.5 12.8 15.6 3.3 0.0  Prone 0.0 0.0 0.0 0.0 0.0  Upright 0.0 0.0 0.0 0.0 0.0    Baseline Respiratory Information Apnea Summary Sub Supine Side Prone Upright  Total 2 Total 2 2 0 0 0    REM 0 0 0 0 0    NREM 2 2 0 0 0  Obs 0 REM 0 0 0 0 0    NREM 0 0 0 0 0  Mix 1 REM 0 0 0 0 0    NREM 1 1 0 0 0  Cen 1 REM 0 0 0 0 0    NREM 1 1 0 0 0   Rera Summary Sub Supine Side Prone Upright  Total 0 Total 0 0 0 0 0    REM 0 0 0 0 0    NREM 0 0 0 0 0   Hypopnea Summary Sub Supine Side Prone Upright  Total 29 Total 29 2 27  0 0    REM 0 0 0 0 0    NREM 29 2 27  0 0   4% Hypopnea Summary Sub Supine Side Prone Upright  Total (4%) 18 Total 18 1 17  0 0    REM 0 0  0 0 0    NREM 18 1 17  0 0     AHI Total Obs Mix Cen  33.51 Apnea 2.16 0.00 1.08 1.08   Hypopnea 31.35 -- -- --  21.62 Hypopnea (4%) 19.46 -- -- --    Total Supine Side Prone Upright  Position AHI 33.51 96.00 30.57 0.00 0.00  REM AHI 0.00   NREM AHI 33.51   Position RDI 33.51 96.00 30.57 0.00 0.00  REM RDI 0.00   NREM RDI 33.51    4% Hypopnea Total Supine Side Prone Upright  Position AHI (4%) 21.62 72.00 19.25 0.00 0.00  REM AHI (4%) 0.00   NREM AHI (4%) 21.62   Position RDI (4%) 21.62 72.00 19.25 0.00 0.00  REM RDI (4%) 0.00   NREM RDI (4%) 21.62    CPAP/ dental device  Respiratory Information Apnea Summary Sub Supine Side Prone Upright  Total 23 Total 23 15 8  0 0    REM 0 0 0 0 0    NREM 23 15 8  0 0  Obs 15 REM 0 0 0 0 0  NREM 15 11 4  0 0  Mix 6 REM 0 0 0 0 0    NREM 6 3 3  0 0  Cen 2 REM 0 0 0 0 0    NREM 2 1 1  0 0   Rera Summary Sub Supine Side Prone Upright  Total 0 Total 0 0 0 0 0    REM 0 0 0 0 0    NREM 0 0 0 0 0   Hypopnea Summary Sub Supine Side Prone Upright  Total 24 Total 24 13 11  0 0    REM 0 0 0 0 0    NREM 24 13 11  0 0   4% Hypopnea Summary Sub Supine Side Prone Upright  Total (4%) 13 Total 13 6 7  0 0    REM 0 0 0 0 0    NREM 13 6 7  0 0     AHI Total Obs Mix Cen  23.21 Apnea 11.36 7.41 2.96 0.99   Hypopnea 11.85 -- -- --  17.78 Hypopnea (4%) 6.42 -- -- --    Total Supine Side Prone Upright  Position AHI 23.21 20.24 29.61 0.00 0.00  REM AHI 0.00   NREM AHI 23.21   Position RDI 23.21 20.24 29.61 0.00 0.00  REM RDI 0.00   NREM RDI 23.21    4% Hypopnea Total Supine Side Prone Upright  Position AHI (4%) 17.78 15.18 23.38 0.00 0.00  REM AHI (4%) 0.00   NREM AHI (4%) 17.78   Position RDI (4%) 17.78 15.18 23.38 0.00 0.00  REM RDI (4%) 0.00   NREM RDI (4%) 17.78    Desaturation Information (Baseline)  <100% <90% <80% <70% <60% <50% <40%  Supine 10 6 0 0 0 0 0  Side 17 2 0 0 0 0 0  Prone 0 0 0 0 0 0 0  Upright 0 0 0 0 0 0 0  Total  27 8 0 0 0 0 0  Desaturation threshold setting: 4% Minimum desaturation setting: 10 seconds SaO2 nadir: 88% The longest event was a 20 sec obstructive Hypopneawith a minimum SaO2 of 90%. The lowest SaO2 was 88% associated with a 12 sec obstructive Hypopnea. Awakening/Arousal Information (Baseline) # of Awakenings 20  Wake after sleep onset 110.39m  Wake after persistent sleep 94.32m   Arousal Assoc. Arousals Index  Apneas 2 2.2  Hypopneas 23 24.9  Leg Movements 10 10.8  Snore 0.0 0.0  PTT Arousals 0 0.0  Spontaneous 50 54.1  Total 84 90.8   Desaturation Information (CPAP)  <100% <90% <80% <70% <60% <50% <40%  Supine 39 29 0 0 0 0 0  Side 23 6 0 0 0 0 0  Prone 0 0 0 0 0 0 0  Upright 0 0 0 0 0 0 0  Total 62 35 0 0 0 0 0  Desaturation threshold setting: 4% Minimum desaturation setting: 10 seconds SaO2 nadir: 85% The longest event was a 38 sec obstructive Apnea with a minimum SaO2 of 89%. The lowest SaO2 was 86% associated with a 23 sec obstructive Hypopnea. Awakening/Arousal Information (CPAP/ dental device ) # of Awakenings 29  Wake after sleep onset 89.66m  Wake after persistent sleep 70.61m   Arousal Assoc. Arousals Index  Apneas 13 6.4  Hypopneas 12 5.9  Leg Movements 29 14.3  Snore 0.0 0.0  PTT Arousals 0 0.0  Spontaneous 33 16.3  Total 85 42.0     EKG Rates (Baseline) EKG Avg Max Min  Awake  77 91 70  Asleep 76 82 71  EKG Events: N/A Myoclonus Information (Baseline) PLMS LMs Index  Total LMs during PLMS 12 13.0  LMs w/ Microarousals 6 6.5   LM LMs Index  w/ Microarousal 4 4.3  w/ Awakening 1 1.1  w/ Resp Event 0 0.0  Spontaneous 3 3.2  Total 7 7.6   EKG Rates (CPAP/ dental device) EKG Avg Max Min  Awake 70 90 64  Asleep 70 83 63  EKG Events: N/A Myoclonus Information (CPAP/ dental device) PLMS LMs Index  Total LMs during PLMS 87 43.0  LMs w/ Microarousals 21 10.4   LM LMs Index  w/ Microarousal 8 4.0  w/ Awakening 5 2.5  w/ Resp Event 0 0.0   Spontaneous 6 3.0  Total 14 6.9

## 2023-03-25 ENCOUNTER — Telehealth: Payer: Self-pay

## 2023-03-25 DIAGNOSIS — G44019 Episodic cluster headache, not intractable: Secondary | ICD-10-CM

## 2023-03-25 DIAGNOSIS — G4713 Recurrent hypersomnia: Secondary | ICD-10-CM

## 2023-03-25 DIAGNOSIS — G4733 Obstructive sleep apnea (adult) (pediatric): Secondary | ICD-10-CM

## 2023-03-25 NOTE — Telephone Encounter (Signed)
-----   Message from Melvyn Novas, MD sent at 03/22/2023  2:24 PM EDT ----- We treated this sleep study like a SPLIT protocol , one part without dental device and one part with dental device: the dental device did not have much influence on the patient's apnea , and she has ongoing hypoxia , poor sleep continuity and  PLMs.  I attached 2 data sets to the report. PLMs were not related to REM sleep, and therefor this patient;s condition cannot be identified as a REM behavior disorder.  My goal is effective apnea treatment and reduction of arousals and PLMs. Lets try a CPAP  I have suggested 2 medications we can try, please let me know if the patient will take the offer. ( Elavil/Flexaril)

## 2023-03-25 NOTE — Telephone Encounter (Signed)
Right knee gel injection  

## 2023-03-25 NOTE — Telephone Encounter (Signed)
VOB submitted for Monovisc, right knee  

## 2023-03-25 NOTE — Telephone Encounter (Signed)
I called patient. I discussed her sleep study results and recommendations.  Patient is reluctant to use a CPAP. She reports that she would get migraines when she used it. She cannot even wear tight fitting hats because of her migraines.  She would be interested in trying amitriptyline or flexeril as long as she would feel more rested when she awoke in the morning.

## 2023-03-26 NOTE — Telephone Encounter (Signed)
Pt called back. Stated she is willing to try CPAP, anything to help with her sleep.

## 2023-03-27 ENCOUNTER — Encounter: Payer: Self-pay | Admitting: Neurology

## 2023-03-27 MED ORDER — AMITRIPTYLINE HCL 25 MG PO TABS: 25.0000 mg | ORAL_TABLET | Freq: Every day | ORAL | 3 refills | Status: DC

## 2023-03-27 NOTE — Telephone Encounter (Signed)
Dr Dohmeier will prescribe amitriptyline 25 mg at bedtime for the patient to also start and we will send the order for the patient to a company that accepts her insurance.

## 2023-04-04 ENCOUNTER — Telehealth (HOSPITAL_BASED_OUTPATIENT_CLINIC_OR_DEPARTMENT_OTHER): Payer: Self-pay

## 2023-04-04 NOTE — Telephone Encounter (Signed)
Lvm for pt to cb to schedule

## 2023-04-04 NOTE — Telephone Encounter (Signed)
Approved for Monovisc-Right knee B&B No copay Covered at 100% No Prior auth required

## 2023-04-16 ENCOUNTER — Ambulatory Visit (INDEPENDENT_AMBULATORY_CARE_PROVIDER_SITE_OTHER): Payer: Medicare Other | Admitting: Podiatry

## 2023-04-16 DIAGNOSIS — L600 Ingrowing nail: Secondary | ICD-10-CM

## 2023-04-16 NOTE — Progress Notes (Signed)
Subjective:  Patient ID: Margaret White, female    DOB: [birth year],  MRN: [id number]  Chief Complaint  Patient presents with   Ingrown Toenail    Ingrown right hallux, medial border. Patient had procedure done 01/2023 but patient states she feels like there is a hangnail and the toe was red and swollen. Patient noticed pus and has been cleaning area with peroxide.     72 y.o. female presents with concern for possible recurrent ingrown on the right hallux medial border.  She had a procedure done in April with removal of ingrown nail.  She says recently there was some redness and drainage as well as occasional aching pain from the area.  She says she drained some pus and that helped with the pain and has gotten better since then.  She has been going in the swimming pool recently.  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 Shallow disc bulging without significant neuroforaminal stenosis.   SUI (stress urinary incontinence, female)    Vitamin B 12 deficiency    Vitamin D deficiency     Allergies  Allergen Reactions   Codeine Itching   Sulfa Antibiotics Rash and Other (See Comments)    Flu like symptoms    ROS: Negative  except as per HPI above  Objective:  General: AAO x3, NAD  Dermatological: Tissue present at the medial border of the right hallux nail there is some eschar present at the nail base where she drained what was likely a small periungual abscess.  No erythema at this time.  No significant pain on palpation.  Hyperkeratotic tissue is present along the medial nail border and there is dry xerotic skin surrounding.  Vascular:  Dorsalis Pedis artery and Posterior Tibial artery pedal pulses are 2/4 bilateral.  Capillary fill time < 3 sec to all digits.   Neruologic: Grossly intact via light touch bilateral. Protective threshold intact to all sites bilateral.   Musculoskeletal: No gross boney pedal deformities bilateral. No pain, crepitus, or limitation noted with foot and ankle range of motion  bilateral. Muscular strength 5/5 in all groups tested bilateral.  Gait: Unassisted, Nonantalgic.   No images are attached to the encounter.   Assessment:   1. Ingrown nail of great toe of right foot      Plan:  Patient was evaluated and treated and all questions answered.  # Ingrown nail of the medial border of right great toe -Discussed with patient she has some hyperkeratotic tissue surrounding the medial border does not appear to be a recurrent ingrown at this time -No evidence of infection do not recommend antibiotics -I do recommend daily Epsom salt soaks for 15 minutes once to twice daily for the next 2 weeks -I also recommend aggressive moisturizing of the medial border of the right hallux to try and break up some of the hyperkeratotic tissue and reduce any irritation from the dry skin she has at the nail border which is likely aggravated by the pools. -Patient will follow-up in 2 weeks to recheck and if there is recurrent issue we will have to consider reperforming additional removal of right hallux medial nail border.  Return in about 2 weeks (around 04/30/2023) for f/u R hallux callus.          Corinna Gab, DPM [facility]

## 2023-04-18 ENCOUNTER — Ambulatory Visit: Payer: Medicare Other | Admitting: Orthopaedic Surgery

## 2023-04-18 ENCOUNTER — Other Ambulatory Visit: Payer: Self-pay

## 2023-04-18 ENCOUNTER — Other Ambulatory Visit: Payer: Self-pay | Admitting: Neurology

## 2023-04-24 ENCOUNTER — Telehealth: Payer: Self-pay | Admitting: Neurology

## 2023-04-24 NOTE — Telephone Encounter (Addendum)
Pt was scheduled for Initial CPAP visit on 07/16/23. Pt was informed to bring machine and power cord to appt. DME: [facility] Phone: [phone] Fax: [phone] Equipment issued: Lolita Cram 11 Auto Pt to be schedule between: 05/19/23-07/18/23

## 2023-04-25 NOTE — Progress Notes (Signed)
Cardiology Office Note:   Date:  04/26/2023  ID:  Margaret White, DOB [birth year], MRN [id number] PCP: Willow Ora, MD  [facility] Providers Cardiologist:  Christell Constant, MD    History of Present Illness:   Margaret White is a 72 y.o. female with medical history of hypertension, hyperlipidemia, non-alcoholic fatty liver disease, aortic atherosclerosis, DM type II, OSA. Patient was last seen by Dr. Izora Ribas on 08/27/22 for evaluation of dyspnea on exertion. At the time of this visit, patient reported frequent activity/going to the gym without cardiac limitation. She was pending surgical repair of knee. She was noted with systolic heart murmur and an echo was arranged. AAA screening US also ordered. Patient also had an ECG at OSH that showed TWI so with pending surgery, exercise NM test also ordered. TTE showed LVEF 60-65% with no RWMA, G1DD. Stress test was low risk without ischemia. AAA [place] without evidence of aortic aneurysm.   Since her last visit, patient has been under quite a bit of stress caring for her spouse who has dementia and also recently had a stroke. As a result of this, formal exercise opportunity is limited (though she does try to stay active, parks far away from stores to walk, etc). She is also having to balance different diet needs as he currently needs to gain weight/consume calories. She isn't able to make 2 different meals right now and so her own need for a lower carbohydrate diet has necessarily been de-prioritized. Regarding dyspnea on exertion, patient does feel that this has progressed slightly since her last visit. She reports noticing that her oxygen will fall to low 90s at times. She has known bibasilar scarring and plans to follow up again with her pulmonologist soon.  Regarding OSA, patient says that a recent sleep study showed that her mouth guard was not working properly and she has gone back to using a CPAP. Now has a nasal device.   Today  patient denies chest pain, lower extremity edema, fatigue, palpitations, melena, hematuria, hemoptysis, diaphoresis, weakness, presyncope, syncope, orthopnea, and PND.   Studies Reviewed:    EKG:   Sinus rhythm without acute ischemic changes. Stable with prior tracing.       See HPI for review of recent studies.  Risk Assessment/Calculations:              Physical Exam:   VS:  BP 118/78 (BP Location: Left Arm, Patient Position: Sitting)   Pulse 71   Ht 5\' 4"  (1.626 m)   Wt 173 lb 9.6 oz (78.7 kg)   SpO2 95%   BMI 29.80 kg/m    Wt Readings from Last 3 Encounters:  04/26/23 173 lb 9.6 oz (78.7 kg)  03/05/23 174 lb 3.2 oz (79 kg)  02/18/23 172 lb (78 kg)     Physical Exam Vitals reviewed.  Constitutional:      Appearance: Normal appearance.  HENT:     Head: Normocephalic.     Nose: Nose normal.  Cardiovascular:     Rate and Rhythm: Normal rate and regular rhythm.     Pulses: Normal pulses.     Heart sounds: Normal heart sounds. No murmur heard.    No friction rub. No gallop.  Pulmonary:     Effort: Pulmonary effort is normal.     Breath sounds: Normal breath sounds.  Abdominal:     General: Abdomen is flat.     Palpations: Abdomen is soft.  Musculoskeletal:  General: Normal range of motion.     Cervical back: Normal range of motion.     Right lower leg: No edema.     Left lower leg: No edema.  Skin:    General: Skin is warm and dry.     Capillary Refill: Capillary refill takes less than 2 seconds.  Neurological:     General: No focal deficit present.     Mental Status: She is alert and oriented to person, place, and time.  Psychiatric:        Mood and Affect: Mood normal.        Behavior: Behavior normal.        Thought Content: Thought content normal.        Judgment: Judgment normal.      ASSESSMENT AND PLAN:    Dyspnea on exertion  Patient reports subjective progression of symptoms, slightly less tolerant of exertion since her last visit  and noticing periodic O2 desaturation that resolves with deep breaths. Denies any symptoms of angina. Given recent normal stress test and echocardiogram without significant valvular abnormalities or reduced LVEF, I feel that this is less likely cardiac etiology. Encouraged patient to follow up with pulmonology. If symptoms continue to progress or she develops chest pain, could consider further stress testing, cardiac CTA.  Aortic atherosclerosis Hyperlipidemia NAFLD  2020 CT with minimal atherosclerosis within aortic arch. Given this finding along with DM type II, and NAFLD, would set LDL goal <70. Patient previously intolerant of statin (not sure which) and has been on Zetia with most recent LDL 97. She is willing to trial low dose Pravastatin.  Start Pravastatin 10mg  Continue Zetia 10mg   Hypertension  Blood pressure well controlled.  Continue Amlodipine 5mg   OSA  Patient with recent sleep study showing her mouth guard was not working properly and has now been placed back on CPAP. Doing much better than before given that this is a nasal device only vs full mask.   Pre-diabetes  Patient's recent A1c 6.2%. Due to caregiver burden, currently limited in dietary modification. Encouraged her to reduced carbohydrate/sugar intake as able.          Signed, Perlie Gold, PA-C

## 2023-04-26 ENCOUNTER — Ambulatory Visit: Payer: Medicare Other | Attending: Internal Medicine | Admitting: Cardiology

## 2023-04-26 ENCOUNTER — Encounter: Payer: Self-pay | Admitting: Cardiology

## 2023-04-26 VITALS — BP 118/78 | HR 71 | Ht 64.0 in | Wt 173.6 lb

## 2023-04-26 DIAGNOSIS — I10 Essential (primary) hypertension: Secondary | ICD-10-CM | POA: Diagnosis not present

## 2023-04-26 DIAGNOSIS — R0602 Shortness of breath: Secondary | ICD-10-CM | POA: Diagnosis not present

## 2023-04-26 DIAGNOSIS — R0609 Other forms of dyspnea: Secondary | ICD-10-CM | POA: Diagnosis not present

## 2023-04-26 MED ORDER — PRAVASTATIN SODIUM 10 MG PO TABS: 10.0000 mg | ORAL_TABLET | Freq: Every day | ORAL | 3 refills | Status: DC

## 2023-04-26 NOTE — Patient Instructions (Signed)
Medication Instructions:  Your physician has recommended you make the following change in your medication:   Start pravastatin 10 mg daily  *If you need a refill on your cardiac medications before your next appointment, please call your pharmacy*   Follow-Up: At [facility], you and your health needs are our priority.  As part of our continuing mission to provide you with exceptional heart care, we have created designated Provider Care Teams.  These Care Teams include your primary Cardiologist (physician) and Advanced Practice Providers (APPs -  Physician Assistants and Nurse Practitioners) who all work together to provide you with the care you need, when you need it.   Your next appointment:   3 month(s)  Provider:   Perlie Gold, PA-C

## 2023-04-30 ENCOUNTER — Ambulatory Visit: Payer: Medicare Other | Admitting: Orthopaedic Surgery

## 2023-04-30 ENCOUNTER — Ambulatory Visit: Payer: Medicare Other | Admitting: Podiatry

## 2023-05-08 ENCOUNTER — Encounter (INDEPENDENT_AMBULATORY_CARE_PROVIDER_SITE_OTHER): Payer: Self-pay

## 2023-05-21 ENCOUNTER — Encounter: Payer: Self-pay | Admitting: Family Medicine

## 2023-05-21 DIAGNOSIS — M8580 Other specified disorders of bone density and structure, unspecified site: Secondary | ICD-10-CM | POA: Insufficient documentation

## 2023-05-21 NOTE — Progress Notes (Signed)
See mychart note.  DEXA 03/2023 lowest t = - 1.5 FN; recheck 2 years

## 2023-05-23 ENCOUNTER — Ambulatory Visit (INDEPENDENT_AMBULATORY_CARE_PROVIDER_SITE_OTHER): Payer: Medicare Other | Admitting: Orthopaedic Surgery

## 2023-05-23 ENCOUNTER — Encounter: Payer: Self-pay | Admitting: Orthopaedic Surgery

## 2023-05-23 DIAGNOSIS — M25561 Pain in right knee: Secondary | ICD-10-CM

## 2023-05-23 DIAGNOSIS — M1711 Unilateral primary osteoarthritis, right knee: Secondary | ICD-10-CM | POA: Diagnosis not present

## 2023-05-23 DIAGNOSIS — Z9889 Other specified postprocedural states: Secondary | ICD-10-CM

## 2023-05-23 DIAGNOSIS — G8929 Other chronic pain: Secondary | ICD-10-CM

## 2023-05-23 MED ORDER — HYALURONAN 88 MG/4ML IX SOSY
88.0000 mg | PREFILLED_SYRINGE | INTRA_ARTICULAR | Status: AC | PRN
  Administered 2023-05-23: 88 mg via INTRA_ARTICULAR

## 2023-05-23 NOTE — Progress Notes (Signed)
   Procedure Note  Patient: Margaret White             Date of Birth: Oct 11, 1950           MRN: [id number]             Visit Date: 05/23/2023  Procedures: Visit Diagnoses:  1. Status post arthroscopic surgery of right knee   2. Chronic pain of right knee   3. Unilateral primary osteoarthritis, right knee     Large Joint Inj: R knee on 05/23/2023 3:07 PM Indications: diagnostic evaluation and pain Details: 22 G 1.5 in needle, superolateral approach  Arthrogram: No  Medications: 88 mg Hyaluronan 88 MG/4ML Outcome: tolerated well, no immediate complications Procedure, treatment alternatives, risks and benefits explained, specific risks discussed. Consent was given by the patient. Immediately prior to procedure a time out was called to verify the correct patient, procedure, equipment, support staff and site/side marked as required. Patient was prepped and draped in the usual sterile fashion.    The patient is a young appearing 72 year old female who comes in today for a Monovisc injection with hyaluronic acid in the right knee to treat the pain from osteoarthritis.  She has had arthroscopic surgery on that knee as well as steroid injections.  The knee is starting to detrimentally affect her mobility and her quality of life.  Going up and down stairs is the harder part for her.  This is affecting more of her thighs and her back and hip as well.  Her right knee today has a moderate effusion so I was able to aspirate about 40 cc of fluid off of her knee and then I placed Monovisc in her right knee today without difficulty.  She understands that this does not work the only other option would be considering knee replacement surgery.  All questions and concerns were answered and addressed.  Lot #[id number]

## 2023-06-03 ENCOUNTER — Encounter: Payer: Self-pay | Admitting: Pulmonary Disease

## 2023-06-03 ENCOUNTER — Ambulatory Visit: Payer: Medicare Other | Admitting: Pulmonary Disease

## 2023-06-03 VITALS — BP 120/90 | HR 103 | Ht 64.0 in | Wt 179.8 lb

## 2023-06-03 DIAGNOSIS — L308 Other specified dermatitis: Secondary | ICD-10-CM

## 2023-06-03 DIAGNOSIS — R0609 Other forms of dyspnea: Secondary | ICD-10-CM

## 2023-06-03 DIAGNOSIS — J302 Other seasonal allergic rhinitis: Secondary | ICD-10-CM | POA: Diagnosis not present

## 2023-06-03 DIAGNOSIS — R0602 Shortness of breath: Secondary | ICD-10-CM

## 2023-06-03 MED ORDER — FLUTICASONE-SALMETEROL 115-21 MCG/ACT IN AERO: 2.0000 | INHALATION_SPRAY | Freq: Two times a day (BID) | RESPIRATORY_TRACT | 12 refills | Status: DC

## 2023-06-03 NOTE — Patient Instructions (Signed)
Thank you for visiting Dr. Tonia Brooms at [facility]. Today we recommend the following:  Meds ordered this encounter  Medications   fluticasone-salmeterol (ADVAIR HFA) 115-21 MCG/ACT inhaler    Sig: Inhale 2 puffs into the lungs 2 (two) times daily.    Dispense:  1 each    Refill:  12   Return in about 6 months (around 12/04/2023) for with APP or Dr. Tonia Brooms.    Please do your part to reduce the spread of COVID-19.

## 2023-06-03 NOTE — Progress Notes (Signed)
Synopsis: Referred in Jan 2020 for cough by Willow Ora, MD  Subjective:   PATIENT ID: Margaret White GENDER: female DOB: Mar 03, 1951, MRN: [id number]  Chief Complaint  Patient presents with   Follow-up    F/up    C/o cough since November. She had pneumonia in November. She has does ring up sputum, white at times. She Has had sinus infection, followed by multiple bouts of bronchitis.  She also has been told several times that she has had pneumonia.  Each time treated with antibiotics and steroids.She routinely frequents a local urgent care with the symptoms.  She states that she has been on antibiotics and steroids  At least once per month since September 2019. She does have some allergies to trees and cats. She had skin testing in the past. She has been on 3-4 rounds of antibiotics. Husband and ex-husband, 45+ years of second hand smoke exposure, basement or outside.  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  She is originally from outside of [place].  Patient denies hemoptysis.  Also when discussing the symptoms that brings her into urgent care they predominantly surround nasal and sinus pressure.  She has ongoing wet cough which is productive of sputum.  She may have had wheezing during this time but is unsure.  Further questioning reveals that she has been on antibiotics and steroids at least 2-3 times per year for the past several years.  This past fall has been the worst.She does notice her symptoms are worse in fall and spring.  OV 12/10/2018: She is still noticing SOB and has noticed that it it worse with exercise of exertion. She has noticed that even doing the laundry has made it worse. She does know that he husband smokes in the basement and that were the laundry is. She does feel sob at work walking around the school.  Patient denies chest pain.  She does have shortness of breath with bending over.  Prior echocardiogram with grade 1 diastolic dysfunction.  Her  symptoms do go away after she exerts herself and then sits and rests.  She did have a hip replacement approximately 6 months ago and she says ever since then she feels like she has been more short of breath with significant amounts of exertion.  She does feel like it has set her back some from her prior exercise tolerance.  OV 06/03/2023: Here today for follow-up.  We have not seen her since March 2020.  She still has some shortness of breath with exertion.  Follow-up CT imaging shows resolution of previous found nodule.  She is currently not on any inhalers.  But does have occasional shortness of breath with exertion.  Prior PFTs looked reassuring.  She does have eczema and seasonal allergies.    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 Shallow disc bulging without significant neuroforaminal stenosis.   SUI (stress urinary incontinence, female)    Vitamin B 12 deficiency    Vitamin D deficiency      Family History  Problem Relation Age of Onset   Hypertension Mother    Congestive Heart Failure Mother    Multiple myeloma Mother    Heart disease Father    Prostate cancer Father    Emphysema Sister    Heart disease Brother    Other Brother        brain tumor   Heart disease Maternal Grandmother    Emphysema Maternal Grandmother    CAD Maternal Grandfather    Heart disease Maternal Grandfather    Cancer Paternal Grandmother        type unknown, mets   Alcoholism Paternal Grandfather    Colon cancer Neg Hx    Esophageal cancer Neg Hx    Stomach cancer Neg Hx    Rectal cancer Neg Hx      Past Surgical History:  Procedure Laterality Date   ABDOMINOPLASTY  2006   ANAL FISSURE REPAIR  2006   BIOPSY  10/27/2020   Procedure: BIOPSY;  Surgeon: Meridee Score, Netty Starring., MD;  Location: [facility] END[facility];  Service: Gastroenterology;;   BLADDER SURGERY  1984   BREAST BIOPSY Right     benign     CARPAL TUNNEL RELEASE Bilateral 1989   CATARACT EXTRACTION W/ INTRAOCULAR LENS  IMPLANT, BILATERAL  2017   COLONOSCOPY      COLONOSCOPY W/ POLYPECTOMY  2017   ECTOPIC PREGNANCY SURGERY  1979   ESOPHAGOGASTRODUODENOSCOPY (EGD) WITH PROPOFOL N/A 10/27/2020   Procedure: ESOPHAGOGASTRODUODENOSCOPY (EGD) WITH PROPOFOL;  Surgeon: Lemar Lofty., MD;  Location: [facility] ENDOSCOPY;  Service: Gastroenterology;  Laterality: N/A;   EYE SURGERY  child    x4  1957-1960   INCONTINENCE SURGERY     NASAL SEPTUM SURGERY  1987   PUBOVAGINAL  SLING N/A 03/24/2019   Procedure: CYSTOSCOPY  Leonides Grills;  Surgeon: Alfredo Martinez, MD;  Location: [facility];  Service: Urology;  Laterality: N/A;   REVISION TOTAL HIP ARTHROPLASTY Right    RHINOPLASTY  2007   TOTAL ABDOMINAL HYSTERECTOMY W/ BILATERAL SALPINGOOPHORECTOMY  1984   w/ Marshall-Marchetti (bladder tack suspension)   TOTAL HIP ARTHROPLASTY Right 04/11/2018   Procedure: RIGHT TOTAL HIP ARTHROPLASTY ANTERIOR APPROACH;  Surgeon: Kathryne Hitch, MD;  Location: [facility];  Service: Orthopedics;  Laterality: Right;   ULNAR NERVE TRANSPOSITION Left 1990s   UPPER ESOPHAGEAL ENDOSCOPIC ULTRASOUND (EUS) N/A 10/27/2020   Procedure: UPPER ESOPHAGEAL ENDOSCOPIC ULTRASOUND (EUS);  Surgeon: Lemar Lofty., MD;  Location: [facility] ENDOSCOPY;  Service: Gastroenterology;  Laterality: N/A;    Social History   Socioeconomic History   Marital status: Married    Spouse name: Not on file   Number of children: 4   Years of education: Not on file   Highest education level: Not on file  Occupational History   Occupation: TEACHER    Employer: [employer]    Comment: retired  Tobacco Use   Smoking status: Never   Smokeless tobacco: Never  Vaping Use   Vaping status: Never Used  Substance and Sexual Activity   Alcohol use: No   Drug use: Never   Sexual activity: Yes    Birth control/protection: Surgical  Other Topics Concern   Not on file  Social History Narrative   Not on file   Social Determinants of Health   Financial Resource  Strain: Low Risk  (02/18/2023)   Overall Financial Resource Strain (CARDIA)    Difficulty of Paying Living Expenses: Not hard at all  Food Insecurity: No Food Insecurity (02/18/2023)   Hunger Vital Sign    Worried About Running Out of Food in the Last Year: Never true    Ran Out of Food in the Last Year: Never true  Transportation Needs: No Transportation Needs (02/18/2023)   PRAPARE - Administrator, Civil Service (Medical): No    Lack of Transportation (Non-Medical): No  Physical Activity: Inactive (02/18/2023)   Exercise Vital Sign    Days of Exercise per Week: 0 days    Minutes of Exercise per Session: 0 min  Stress: No Stress Concern Present (02/18/2023)   [employer] of Occupational Health - Occupational Stress Questionnaire    Feeling of Stress : Not at all  Social Connections: Moderately Integrated (02/18/2023)   Social Connection and Isolation Panel [NHANES]    Frequency of Communication with Friends and Family: More than three times a week    Frequency of Social Gatherings with Friends and Family: More than three times a week    Attends Religious Services: More than 4 times per year    Active Member of [employer] or Organizations: No    Attends Banker Meetings: Never    Marital Status: Married  Catering manager Violence: Not At Risk (02/18/2023)   Humiliation, Afraid, Rape, and Kick questionnaire    Fear of Current or Ex-Partner: No    Emotionally Abused: No    Physically Abused: No    Sexually Abused: No     Allergies  Allergen Reactions   Codeine Itching   Sulfa Antibiotics Rash and Other (See Comments)    Flu like symptoms     Outpatient Medications Prior to Visit  Medication Sig Dispense Refill   amitriptyline (ELAVIL) 25 MG tablet Take 1 tablet (25 mg total)  by mouth at bedtime. 30 tablet 3   amLODipine (NORVASC) 10 MG tablet Take 0.5 tablets (5 mg total) by mouth daily. 90 tablet 3   calcium carbonate (OS-CAL) 600 MG TABS tablet Take 600  mg by mouth daily with breakfast.     DULoxetine (CYMBALTA) 60 MG capsule TAKE 1 CAPSULE DAILY (REPLACING 30 MG PRESCRIPTION) 90 capsule 3   esomeprazole (NEXIUM) 40 MG capsule Take 1 capsule (40 mg total) by mouth 2 (two) times daily before a meal. 180 capsule 3   ezetimibe (ZETIA) 10 MG tablet TAKE 1 TABLET DAILY 90 tablet 3   FLUoxetine (PROZAC) 10 MG tablet Take 1 tablet (10 mg total) by mouth daily. 90 tablet 3   fluticasone (FLONASE) 50 MCG/ACT nasal spray USE 1 SPRAY IN EACH NOSTRIL DAILY AS NEEDED FOR ALLERGIES 48 g 5   gabapentin (NEURONTIN) 300 MG capsule TAKE 1 CAPSULE THREE TIMES A DAY 270 capsule 3   levocetirizine (XYZAL) 5 MG tablet Take 5 mg by mouth every evening.     Polyvinyl Alcohol-Povidone PF (REFRESH) 1.4-0.6 % SOLN Place 1 drop into both eyes daily as needed (Dry eye).     pravastatin (PRAVACHOL) 10 MG tablet Take 1 tablet (10 mg total) by mouth daily. 90 tablet 3   triamcinolone cream (KENALOG) 0.1 %      No facility-administered medications prior to visit.    Review of Systems  Constitutional:  Negative for chills, fever, malaise/fatigue and weight loss.  HENT:  Negative for hearing loss, sore throat and tinnitus.   Eyes:  Negative for blurred vision and double vision.  Respiratory:  Positive for shortness of breath. Negative for cough, hemoptysis, sputum production, wheezing and stridor.   Cardiovascular:  Negative for chest pain, palpitations, orthopnea, leg swelling and PND.  Gastrointestinal:  Negative for abdominal pain, constipation, diarrhea, heartburn, nausea and vomiting.  Genitourinary:  Negative for dysuria, hematuria and urgency.  Musculoskeletal:  Negative for joint pain and myalgias.  Skin:  Negative for itching and rash.  Neurological:  Negative for dizziness, tingling, weakness and headaches.  Endo/Heme/Allergies:  Negative for environmental allergies. Does not bruise/bleed easily.  Psychiatric/Behavioral:  Negative for depression. The patient is  not nervous/anxious and does not have insomnia.   All other systems reviewed and are negative.    Objective:  Physical Exam Vitals reviewed.  Constitutional:      General: She is not in acute distress.    Appearance: She is well-developed.  HENT:     Head: Normocephalic and atraumatic.  Eyes:     General: No scleral icterus.    Conjunctiva/sclera: Conjunctivae normal.     Pupils: Pupils are equal, round, and reactive to light.  Neck:     Vascular: No JVD.     Trachea: No tracheal deviation.  Cardiovascular:     Rate and Rhythm: Normal rate and regular rhythm.     Heart sounds: Normal heart sounds. No murmur heard. Pulmonary:     Effort: Pulmonary effort is normal. No tachypnea, accessory muscle usage or respiratory distress.     Breath sounds: No stridor. No wheezing, rhonchi or rales.  Abdominal:     General: Bowel sounds are normal. There is no distension.     Palpations: Abdomen is soft.     Tenderness: There is no abdominal tenderness.  Musculoskeletal:        General: No tenderness.     Cervical back: Neck supple.  Lymphadenopathy:     Cervical: No cervical adenopathy.  Skin:  General: Skin is warm and dry.     Capillary Refill: Capillary refill takes less than 2 seconds.     Findings: No rash.  Neurological:     Mental Status: She is alert and oriented to person, place, and time.  Psychiatric:        Behavior: Behavior normal.      Vitals:   06/03/23 1301  BP: (!) 120/90  Pulse: (!) 103  SpO2: 99%  Weight: 179 lb 12.8 oz (81.6 kg)  Height: 5\' 4"  (1.626 m)   99% on RA BMI Readings from Last 3 Encounters:  06/03/23 30.86 kg/m  04/26/23 29.80 kg/m  03/05/23 30.37 kg/m   Wt Readings from Last 3 Encounters:  06/03/23 179 lb 12.8 oz (81.6 kg)  04/26/23 173 lb 9.6 oz (78.7 kg)  03/05/23 174 lb 3.2 oz (79 kg)     CBC    Component Value Date/Time   WBC 5.9 03/05/2023 1101   RBC 4.76 03/05/2023 1101   HGB 13.5 03/05/2023 1101   HCT 41.7  03/05/2023 1101   PLT 331.0 03/05/2023 1101   MCV 87.6 03/05/2023 1101   MCH 28.2 04/09/2022 1601   MCHC 32.4 03/05/2023 1101   RDW 14.8 03/05/2023 1101   LYMPHSABS 2.0 03/05/2023 1101   MONOABS 0.5 03/05/2023 1101   EOSABS 0.3 03/05/2023 1101   BASOSABS 0.1 03/05/2023 1101    Chest Imaging: 10/06/2018 chest x-ray: Basilar linear opacities, present on previous 3 chest x-rays The patient's images have been independently reviewed by me.    Pulmonary Functions Testing Results:    Latest Ref Rng & Units 12/10/2018    9:52 AM  PFT Results  FVC-Pre L 3.02   FVC-Predicted Pre % 94   FVC-Post L 2.92   FVC-Predicted Post % 91   Pre FEV1/FVC % % 88   Post FEV1/FCV % % 92   FEV1-Pre L 2.67   FEV1-Predicted Pre % 109   FEV1-Post L 2.69   DLCO uncorrected ml/min/mmHg 19.65   DLCO UNC% % 97   DLCO corrected ml/min/mmHg 19.41   DLCO COR %Predicted % 96   DLVA Predicted % 98   TLC L 4.93   TLC % Predicted % 96   RV % Predicted % 84     FeNO: None   Pathology: None   Echocardiogram:  06/26/2018 echocardiogram: Preserved ejection fraction 60 to 65%, mild MR, grade 1 diastolic dysfunction  Heart Catheterization: None     Assessment & Plan:   SOB (shortness of breath)  DOE (dyspnea on exertion)  Other eczema  Seasonal allergies  Discussion:  This is a 72 year old female initially seen for recurrent upper respiratory tract symptoms and seasonal allergies and eczema.  Felt at that time to be related to possible underlying asthma symptoms.  CT imaging with a pulmonary nodule with follow-up that has shown resolution.  Never smoker but does have secondhand smoke exposure.  Plan: Will start her on Advair HFA 115, 2 puffs twice daily. Hopefully this will help with symptoms. Will have her follow-up with [place] in a couple of months to see if she is having breakthrough asthma symptoms. If she is we could always consider escalating to include a leukotriene inhibitor. Other options  would be to consider blood work CBC plus differential, IgE plus RAST panel if we need this in the future. RTC 6 months with me or APP.   Current Outpatient Medications:    amitriptyline (ELAVIL) 25 MG tablet, Take 1 tablet (25 mg total)  by mouth at bedtime., Disp: 30 tablet, Rfl: 3   amLODipine (NORVASC) 10 MG tablet, Take 0.5 tablets (5 mg total) by mouth daily., Disp: 90 tablet, Rfl: 3   calcium carbonate (OS-CAL) 600 MG TABS tablet, Take 600 mg by mouth daily with breakfast., Disp: , Rfl:    DULoxetine (CYMBALTA) 60 MG capsule, TAKE 1 CAPSULE DAILY (REPLACING 30 MG PRESCRIPTION), Disp: 90 capsule, Rfl: 3   esomeprazole (NEXIUM) 40 MG capsule, Take 1 capsule (40 mg total) by mouth 2 (two) times daily before a meal., Disp: 180 capsule, Rfl: 3   ezetimibe (ZETIA) 10 MG tablet, TAKE 1 TABLET DAILY, Disp: 90 tablet, Rfl: 3   FLUoxetine (PROZAC) 10 MG tablet, Take 1 tablet (10 mg total) by mouth daily., Disp: 90 tablet, Rfl: 3   fluticasone (FLONASE) 50 MCG/ACT nasal spray, USE 1 SPRAY IN EACH NOSTRIL DAILY AS NEEDED FOR ALLERGIES, Disp: 48 g, Rfl: 5   fluticasone-salmeterol (ADVAIR HFA) 115-21 MCG/ACT inhaler, Inhale 2 puffs into the lungs 2 (two) times daily., Disp: 1 each, Rfl: 12   gabapentin (NEURONTIN) 300 MG capsule, TAKE 1 CAPSULE THREE TIMES A DAY, Disp: 270 capsule, Rfl: 3   levocetirizine (XYZAL) 5 MG tablet, Take 5 mg by mouth every evening., Disp: , Rfl:    Polyvinyl Alcohol-Povidone PF (REFRESH) 1.4-0.6 % SOLN, Place 1 drop into both eyes daily as needed (Dry eye)., Disp: , Rfl:    pravastatin (PRAVACHOL) 10 MG tablet, Take 1 tablet (10 mg total) by mouth daily., Disp: 90 tablet, Rfl: 3   triamcinolone cream (KENALOG) 0.1 %, , Disp: , Rfl:    Josephine Igo, DO [facility] 06/03/2023 1:16 PM

## 2023-06-05 ENCOUNTER — Other Ambulatory Visit (HOSPITAL_COMMUNITY): Payer: Self-pay

## 2023-06-05 ENCOUNTER — Telehealth: Payer: Self-pay

## 2023-06-05 NOTE — Telephone Encounter (Signed)
Pharmacy Patient Advocate Encounter  Received notification from [employer] that Prior Authorization for Advair HFA has been  stalled due to: generic fluticasone/salmeterol diskus (for example, Wixela and other generics) and generic budesonide/formoterol (Symbicort) are available without requiring prior authorization   PA #/Case ID/Reference #: [id number]

## 2023-06-07 ENCOUNTER — Ambulatory Visit: Payer: Medicare Other | Admitting: Family Medicine

## 2023-06-10 ENCOUNTER — Encounter: Payer: Self-pay | Admitting: Family Medicine

## 2023-06-11 ENCOUNTER — Other Ambulatory Visit: Payer: Self-pay | Admitting: *Deleted

## 2023-06-11 MED ORDER — BUDESONIDE-FORMOTEROL FUMARATE 160-4.5 MCG/ACT IN AERO: 2.0000 | INHALATION_SPRAY | Freq: Two times a day (BID) | RESPIRATORY_TRACT | 11 refills | Status: AC

## 2023-06-11 NOTE — Telephone Encounter (Signed)
Symbicort 160 sent to pharmacy per Dr. Tonia Brooms.  Nothing further needed.

## 2023-06-12 ENCOUNTER — Telehealth: Payer: Self-pay | Admitting: Orthopaedic Surgery

## 2023-06-12 NOTE — Telephone Encounter (Signed)
Patient called. Says her R knee is swollen. L hip is painful. Would like to know if she should come in? Cb [phone]

## 2023-06-13 ENCOUNTER — Other Ambulatory Visit (INDEPENDENT_AMBULATORY_CARE_PROVIDER_SITE_OTHER): Payer: Medicare Other

## 2023-06-13 ENCOUNTER — Ambulatory Visit (INDEPENDENT_AMBULATORY_CARE_PROVIDER_SITE_OTHER): Payer: Medicare Other | Admitting: Physician Assistant

## 2023-06-13 ENCOUNTER — Encounter: Payer: Self-pay | Admitting: Physician Assistant

## 2023-06-13 DIAGNOSIS — M25552 Pain in left hip: Secondary | ICD-10-CM | POA: Diagnosis not present

## 2023-06-13 NOTE — Progress Notes (Signed)
Office Visit Note   Patient: Margaret White           Date of Birth: 03/02/1951           MRN: [id number] Visit Date: 06/13/2023              Requested by: Willow Ora, MD [address] [place],  [place] [address] PCP: Willow Ora, MD  Chief Complaint  Patient presents with   Left Hip - Pain      HPI: Patient is a pleasant 72 year old woman who is a patient of Dr. Eliberto Ivory.  She has a history of arthritis in her right knee.  She has had an arthroscopic procedure.  Most recently couple weeks ago had a Monovisc injection.  She says she has had the return of some swelling to her knee no fever chills or pain.  She is concerned because she started to get left lateral hip pain.  She is not sure if this is compensatory.  She notices this only when she is sleeping on the side at night.  Denies any groin pain.  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  She says this does not feel like that it is more isolated to the lateral hip.  Denies any back pain any radicular pain  Assessment & Plan: Visit Diagnoses:  1. Pain in left hip     Plan: Exam consistent today with focal trochanteric bursitis though she is not particularly painful.  We talked about the natural history of this and how she could be better helped.  Offered her injection but she does not feel her symptoms are significant enough at this time.  Talked about applying something topical and have given her a home exercise program for IT band stretching.  Can follow-up with Dr. Magnus Ivan.  With regards to the swelling in her right knee she does have a mild effusion however no warmth red or cellulitis.  Her knee is not bothering her so she does not think an injection would be necessary at this time  Follow-Up Instructions: Return if symptoms worsen or fail to improve.   Ortho Exam  Patient is alert, oriented, no adenopathy, well-dressed, normal affect, normal respiratory effort. Examination of  her left hip she is neurovascular intact she has excellent range of motion without pain.  She has no 1 particular spot over the lateral hip although this is the area that bothers her most no back pain no radicular findings her strength is good.  Sensation is intact.  She does have some tightness along the IT band.  Compartments are soft and compressible  Imaging: XR HIP UNILAT W OR W/O PELVIS 2-3 VIEWS LEFT  Result Date: 06/13/2023 Radial caps of her pelvis left hip were taken today no evidence of fracture.  Overall minimal changes from previous x-rays 5 years ago a little sclerotic changes but femoral head is well centered status post right hip total arthroplasty  No images are attached to the encounter.  Labs: Lab Results  Component Value Date   HGBA1C 6.2 03/05/2023   HGBA1C 6.0 (A) 08/28/2022   HGBA1C 6.4 02/27/2022   ESRSEDRATE 17 06/24/2020     Lab Results  Component Value Date   ALBUMIN 4.3 03/05/2023   ALBUMIN 4.3 08/28/2022   ALBUMIN 4.6 04/09/2022    Lab Results  Component Value Date   MG 2.5 (H) 05/14/2018   Lab Results  Component Value Date   VD25OH 22.83 (L) 03/05/2023  VD25OH 36.46 08/19/2019   VD25OH 31.44 10/14/2017    No results found for: "PREALBUMIN"    Latest Ref Rng & Units 03/05/2023   11:01 AM 07/03/2022   12:53 PM 04/09/2022    4:01 PM  CBC EXTENDED  WBC 4.0 - 10.5 K/uL 5.9  4.5  5.4   RBC 3.87 - 5.11 Mil/uL 4.76  4.58  4.93   Hemoglobin 12.0 - 15.0 g/dL 16.1  09.6  04.5   HCT 36.0 - 46.0 % 41.7  40.8  43.5   Platelets 150.0 - 400.0 K/uL 331.0  303.0  214   NEUT# 1.4 - 7.7 K/uL 3.1   3.0   Lymph# 0.7 - 4.0 K/uL 2.0   1.4      There is no height or weight on file to calculate BMI.  Orders:  Orders Placed This Encounter  Procedures   XR HIP UNILAT W OR W/O PELVIS 2-3 VIEWS LEFT   No orders of the defined types were placed in this encounter.    Procedures: No procedures performed  Clinical Data: No additional findings.  ROS:  All  other systems negative, except as noted in the HPI. Review of Systems  Objective: Vital Signs: There were no vitals taken for this visit.  Specialty Comments:  No specialty comments available.  PMFS History: Patient Active Problem List   Diagnosis Date Noted   Osteopenia 05/21/2023   Hypersomnia, periodic 01/02/2023   Hypnapompic hallucinations 01/02/2023   Sleep paralysis, recurrent isolated 01/02/2023   Nightmares 01/02/2023   Episodic cluster headache, not intractable 01/02/2023   Heart murmur, systolic 08/27/2022   Family history of abdominal aortic aneurysm (AAA) 07/03/2022   Periodic limb movement sleep disorder 08/15/2021   Prediabetes 02/09/2021   Tubular adenoma of colon 08/19/2019   DOE (dyspnea on exertion) 05/14/2018   Incontinence without sensory awareness 05/08/2018   Mixed incontinence 05/08/2018   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   Statin intolerance 03/25/2018   Bilateral primary osteoarthritis of hip 11/05/2017   Spondylosis of lumbar region without myelopathy or radiculopathy 11/04/2017   GERD (gastroesophageal reflux disease) 10/14/2017   Allergic rhinitis 10/14/2017   NASH (nonalcoholic steatohepatitis) 10/14/2017   OSA (obstructive sleep apnea) 10/14/2017   History of cluster headache 10/14/2017   IBS (irritable bowel syndrome) 10/14/2017   Fibromyalgia 10/14/2017   Chronic maxillary sinusitis 10/14/2017   Obesity (BMI 30-39.9) 10/14/2017   Mixed hyperlipidemia 10/14/2017   Vitamin B12 deficiency 10/14/2017   Vitamin D deficiency 10/14/2017   Female cystocele 10/14/2017   Primary osteoarthritis of both hands 07/10/2017   Essential hypertension 06/26/2017   Recurrent major depressive disorder, in full remission (HCC) 06/26/2017   Past Medical History:  Diagnosis Date   Allergic rhinitis    Allergy    Anxiety    Asthma    Avascular necrosis of hip, right (HCC) 03/24/2018   Cataract    Chronic  cluster headache    Chronic sinusitis    Diabetes mellitus type 2, diet-controlled (HCC)    per pt currently no taking metformin, followed by pcp   Eczema    Essential hypertension    cardiologsit-- dr dr Dulce Sellar   Family history of abdominal aortic aneurysm (AAA) 07/03/2022   brother   Fatty liver    Fibromyalgia    GERD (gastroesophageal reflux disease)    Hepatic steatosis 10/14/2017   Hiatal hernia    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   Bilateral right worse than left that  is mild in nature X-rays obtained in [place] on 07/12/2017 that are available through the [facility] PACS system   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 Shallow disc bulging without significant neuroforaminal stenosis.   SUI (stress urinary incontinence, female)    Vitamin B 12 deficiency    Vitamin D  deficiency     Family History  Problem Relation Age of Onset   Hypertension Mother    Congestive Heart Failure Mother    Multiple myeloma Mother    Heart disease Father    Prostate cancer Father    Emphysema Sister    Heart disease Brother    Other Brother        brain tumor   Heart disease Maternal Grandmother    Emphysema Maternal Grandmother    CAD Maternal Grandfather    Heart disease Maternal Grandfather    Cancer Paternal Grandmother        type unknown, mets   Alcoholism Paternal Grandfather    Colon cancer Neg Hx    Esophageal cancer Neg Hx    Stomach cancer Neg Hx    Rectal cancer Neg Hx     Past Surgical History:  Procedure Laterality Date   ABDOMINOPLASTY  2006   ANAL FISSURE REPAIR  2006   BIOPSY  10/27/2020   Procedure: BIOPSY;  Surgeon: Meridee Score, Netty Starring., MD;  Location: [facility];  Service: Gastroenterology;;   BLADDER SURGERY  1984   BREAST BIOPSY Right     benign     CARPAL TUNNEL RELEASE Bilateral 1989   CATARACT EXTRACTION W/ INTRAOCULAR LENS  IMPLANT, BILATERAL  2017   COLONOSCOPY     COLONOSCOPY W/ POLYPECTOMY  2017   ECTOPIC PREGNANCY SURGERY  1979   ESOPHAGOGASTRODUODENOSCOPY (EGD) WITH PROPOFOL N/A 10/27/2020   Procedure: ESOPHAGOGASTRODUODENOSCOPY (EGD) WITH PROPOFOL;  Surgeon: Lemar Lofty., MD;  Location: [facility] ENDOSCOPY;  Service: Gastroenterology;  Laterality: N/A;   EYE SURGERY  child    x4  1957-1960   INCONTINENCE SURGERY     NASAL SEPTUM SURGERY  1987   PUBOVAGINAL SLING N/A 03/24/2019   Procedure: CYSTOSCOPY  PUBO-VAGINAL SLING;  Surgeon: Alfredo Martinez,  MD;  Location: [facility];  Service: Urology;  Laterality: N/A;   REVISION TOTAL HIP ARTHROPLASTY Right    RHINOPLASTY  2007   TOTAL ABDOMINAL HYSTERECTOMY W/ BILATERAL SALPINGOOPHORECTOMY  1984   w/ Marshall-Marchetti (bladder tack suspension)   TOTAL HIP ARTHROPLASTY Right 04/11/2018   Procedure: RIGHT TOTAL HIP ARTHROPLASTY ANTERIOR APPROACH;   Surgeon: Kathryne Hitch, MD;  Location: [facility];  Service: Orthopedics;  Laterality: Right;   ULNAR NERVE TRANSPOSITION Left 1990s   UPPER ESOPHAGEAL ENDOSCOPIC ULTRASOUND (EUS) N/A 10/27/2020   Procedure: UPPER ESOPHAGEAL ENDOSCOPIC ULTRASOUND (EUS);  Surgeon: Lemar Lofty., MD;  Location: [facility] ENDOSCOPY;  Service: Gastroenterology;  Laterality: N/A;   Social History   Occupational History   Occupation: Magazine features editor: [employer]    Comment: retired  Tobacco Use   Smoking status: Never   Smokeless tobacco: Never  Vaping Use   Vaping status: Never Used  Substance and Sexual Activity   Alcohol use: No   Drug use: Never   Sexual activity: Yes    Birth control/protection: Surgical

## 2023-07-01 ENCOUNTER — Ambulatory Visit: Payer: Medicare Other | Admitting: Family Medicine

## 2023-07-05 ENCOUNTER — Other Ambulatory Visit (HOSPITAL_COMMUNITY): Payer: Self-pay

## 2023-07-08 ENCOUNTER — Ambulatory Visit (INDEPENDENT_AMBULATORY_CARE_PROVIDER_SITE_OTHER): Payer: Medicare Other | Admitting: Family Medicine

## 2023-07-08 ENCOUNTER — Encounter: Payer: Self-pay | Admitting: Family Medicine

## 2023-07-08 VITALS — BP 122/74 | HR 91 | Temp 98.2°F | Ht 64.0 in | Wt 178.0 lb

## 2023-07-08 DIAGNOSIS — I10 Essential (primary) hypertension: Secondary | ICD-10-CM | POA: Diagnosis not present

## 2023-07-08 DIAGNOSIS — G4733 Obstructive sleep apnea (adult) (pediatric): Secondary | ICD-10-CM

## 2023-07-08 DIAGNOSIS — T887XXA Unspecified adverse effect of drug or medicament, initial encounter: Secondary | ICD-10-CM | POA: Diagnosis not present

## 2023-07-08 DIAGNOSIS — R682 Dry mouth, unspecified: Secondary | ICD-10-CM

## 2023-07-08 MED ORDER — AMLODIPINE BESYLATE 10 MG PO TABS: 10.0000 mg | ORAL_TABLET | Freq: Every day | ORAL | 3 refills | Status: DC

## 2023-07-08 NOTE — Progress Notes (Signed)
Subjective  CC:  Chief Complaint  Patient presents with   Hypertension   Fibromyalgia   Medical Management of Chronic Issues    Pt fasting    HPI: Margaret White is a 72 y.o. female who presents to the office today to address the problems listed above in the chief complaint. Hypertension f/u: Control is good . Pt reports she is doing well. taking medications as instructed, no medication side effects noted, no TIAs, no chest pain on exertion, no dyspnea on exertion, no swelling of ankles. Increased dose of amlodipine to 10 and tolerating well. She denies adverse effects from his BP medications. Compliance with medication is good.  C/o dry mouth since starting amitriptyline for sleep / sleep apnea. See notes On cpap now. Helping.  Stress: husband with worsening dementia and s/p CVA... declining. Brother in law passed away and brother committed suicide. Pt has been stressed but coping best she can.   Assessment  1. Essential hypertension   2. Dry mouth   3. Medication side effect   4. OSA (obstructive sleep apnea)      Plan   Hypertension f/u: BP control is well controlled. Continue amlodipine 10.  Discussed mgt of dry mouth vs trial of other sleep aid. She will contact neurology for input.  Coping well with home/life stressors. Continue mood meds.  Declines flu vaccine  Education regarding management of these chronic disease states was given. Management strategies discussed on successive visits include dietary and exercise recommendations, goals of achieving and maintaining IBW, and lifestyle modifications aiming for adequate sleep and minimizing stressors.   Follow up: 6 mo for recheck.  No orders of the defined types were placed in this encounter.  Meds ordered this encounter  Medications   amLODipine (NORVASC) 10 MG tablet    Sig: Take 1 tablet (10 mg total) by mouth daily.    Dispense:  90 tablet    Refill:  3    Should be 10mg  daily, not 5mg  (this RX is with  corrected sig). thanks      BP Readings from Last 3 Encounters:  07/08/23 122/74  06/03/23 (!) 120/90  04/26/23 118/78   Wt Readings from Last 3 Encounters:  07/08/23 178 lb (80.7 kg)  06/03/23 179 lb 12.8 oz (81.6 kg)  04/26/23 173 lb 9.6 oz (78.7 kg)    Lab Results  Component Value Date   CHOL 196 03/05/2023   CHOL 196 08/28/2022   CHOL 206 (H) 02/27/2022   Lab Results  Component Value Date   HDL 69.50 03/05/2023   HDL 52.40 08/28/2022   HDL 49.00 02/27/2022   Lab Results  Component Value Date   LDLCALC 103 (H) 08/28/2022   LDLCALC 104 (H) 02/08/2020   LDLCALC 108 (H) 07/30/2018   Lab Results  Component Value Date   TRIG 220.0 (H) 03/05/2023   TRIG 199.0 (H) 08/28/2022   TRIG 243.0 (H) 02/27/2022   Lab Results  Component Value Date   CHOLHDL 3 03/05/2023   CHOLHDL 4 08/28/2022   CHOLHDL 4 02/27/2022   Lab Results  Component Value Date   LDLDIRECT 97.0 03/05/2023   LDLDIRECT 113.0 02/27/2022   LDLDIRECT 111.0 02/09/2021   Lab Results  Component Value Date   CREATININE 1.14 03/05/2023   BUN 17 03/05/2023   NA 141 03/05/2023   K 4.0 03/05/2023   CL 102 03/05/2023   CO2 27 03/05/2023    The 10-year ASCVD risk score (Arnett DK, et al., 2019) is: 20.1%  Values used to calculate the score:     Age: 75 years     Sex: Female     Is Non-Hispanic African American: No     Diabetic: Yes     Tobacco smoker: No     Systolic Blood Pressure: 122 mmHg     Is BP treated: Yes     HDL Cholesterol: 69.5 mg/dL     Total Cholesterol: 196 mg/dL  I reviewed the patients updated PMH, FH, and SocHx.    Patient Active Problem List   Diagnosis Date Noted   Prediabetes 02/09/2021    Priority: High   Tubular adenoma of colon 08/19/2019    Priority: High   Statin intolerance 03/25/2018    Priority: High   GERD (gastroesophageal reflux disease) 10/14/2017    Priority: High   NASH (nonalcoholic steatohepatitis) 10/14/2017    Priority: High   OSA (obstructive  sleep apnea) 10/14/2017    Priority: High   Fibromyalgia 10/14/2017    Priority: High   Obesity (BMI 30-39.9) 10/14/2017    Priority: High   Mixed hyperlipidemia 10/14/2017    Priority: High   Essential hypertension 06/26/2017    Priority: High   Incontinence without sensory awareness 05/08/2018    Priority: Medium    Mixed incontinence 05/08/2018    Priority: Medium    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    Priority: Medium    Bilateral primary osteoarthritis of hip 11/05/2017    Priority: Medium    Spondylosis of lumbar region without myelopathy or radiculopathy 11/04/2017    Priority: Medium    IBS (irritable bowel syndrome) 10/14/2017    Priority: Medium    Recurrent major depressive disorder, in full remission (HCC) 06/26/2017    Priority: Medium    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    Priority: Low   Allergic rhinitis 10/14/2017    Priority: Low   History of cluster headache 10/14/2017    Priority: Low   Chronic maxillary sinusitis 10/14/2017    Priority: Low   Vitamin B12 deficiency 10/14/2017    Priority: Low   Vitamin D deficiency 10/14/2017    Priority: Low   Female cystocele 10/14/2017    Priority: Low   Primary osteoarthritis of both hands 07/10/2017    Priority: Low   Osteopenia 05/21/2023   Hypersomnia, periodic 01/02/2023   Hypnapompic hallucinations 01/02/2023   Sleep paralysis, recurrent isolated 01/02/2023   Nightmares 01/02/2023   Episodic cluster headache, not intractable 01/02/2023   Heart murmur, systolic 08/27/2022   Family history of abdominal aortic aneurysm (AAA) 07/03/2022   Periodic limb movement sleep disorder 08/15/2021   DOE (dyspnea on exertion) 05/14/2018    Allergies: Codeine and Sulfa antibiotics  Social History: Patient  reports that she has never smoked. She has never used smokeless tobacco. She reports that she does not drink alcohol and does not use drugs.  No outpatient medications have been marked as taking  for the 07/08/23 encounter (Office Visit) with Willow Ora, MD.    Review of Systems: Cardiovascular: negative for chest pain, palpitations, leg swelling, orthopnea Respiratory: negative for SOB, wheezing or persistent cough Gastrointestinal: negative for abdominal pain Genitourinary: negative for dysuria or gross hematuria  Objective  Vitals: BP 122/74 Comment: right arm seated  Pulse 91   Temp 98.2 F (36.8 C)   Ht 5\' 4"  (1.626 m)   Wt 178 lb (80.7 kg)   SpO2 97%   BMI 30.55 kg/m  General: no acute distress  Psych:  Alert and oriented, normal mood and affect Cardiovascular:  RRR without murmur. no edema Respiratory:  Good breath sounds bilaterally, CTAB with normal respiratory effort Neurologic:   Mental status is normal Commons side effects, risks, benefits, and alternatives for medications and treatment plan prescribed today were discussed, and the patient expressed understanding of the given instructions. Patient is instructed to call or message via MyChart if he/she has any questions or concerns regarding our treatment plan. No barriers to understanding were identified. We discussed Red Flag symptoms and signs in detail. Patient expressed understanding regarding what to do in case of urgent or emergency type symptoms.  Medication list was reconciled, printed and provided to the patient in AVS. Patient instructions and summary information was reviewed with the patient as documented in the AVS. This note was prepared with assistance of Dragon voice recognition software. Occasional wrong-word or sound-a-like substitutions may have occurred due to the inherent limitation

## 2023-07-10 DIAGNOSIS — L219 Seborrheic dermatitis, unspecified: Secondary | ICD-10-CM | POA: Diagnosis not present

## 2023-07-13 ENCOUNTER — Other Ambulatory Visit: Payer: Self-pay | Admitting: Neurology

## 2023-07-16 ENCOUNTER — Ambulatory Visit: Payer: Medicare Other | Admitting: Neurology

## 2023-07-16 NOTE — Patient Instructions (Incomplete)
Please continue using your CPAP regularly. While your insurance requires that you use CPAP at least 4 hours each night on 70% of the nights, I recommend, that you not skip any nights and use it throughout the night if you can. Getting used to CPAP and staying with the treatment long term does take time and patience and discipline. Untreated obstructive sleep apnea when it is moderate to severe can have an adverse impact on cardiovascular health and raise her risk for heart disease, arrhythmias, hypertension, congestive heart failure, stroke and diabetes. Untreated obstructive sleep apnea causes sleep disruption, nonrestorative sleep, and sleep deprivation. This can have an impact on your day to day functioning and cause daytime sleepiness and impairment of cognitive function, memory loss, mood disturbance, and problems focussing. Using CPAP regularly can improve these symptoms.  We will update supply orders, today. We will continue amitriptyline 25mg  at bedtime.Try looking for water flavoring with healthy sweeteners like erythritol. Vitamin Water Zeros are sold at most any grocery store. You could also use Biotene products from the pharmacy. You can try adjusting the humidity settings on your CPAP if having more dry mouth at night.   Please be careful of adjustments to fluoxetine, duloxetine or amitriptyline as these are all similar medications that can effect mood. Please let your providers know immediately if you have any concerns of worsening depression or suicidal ideations.   Let me know if you need me!   Follow up in 1 year
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
## 2023-07-17 ENCOUNTER — Encounter: Payer: Self-pay | Admitting: Family Medicine

## 2023-07-17 ENCOUNTER — Ambulatory Visit (INDEPENDENT_AMBULATORY_CARE_PROVIDER_SITE_OTHER): Payer: Medicare Other | Admitting: Family Medicine

## 2023-07-17 VITALS — BP 109/77 | HR 87 | Ht 64.0 in | Wt 179.0 lb

## 2023-07-17 DIAGNOSIS — G43009 Migraine without aura, not intractable, without status migrainosus: Secondary | ICD-10-CM

## 2023-07-17 DIAGNOSIS — G4733 Obstructive sleep apnea (adult) (pediatric): Secondary | ICD-10-CM

## 2023-07-17 DIAGNOSIS — F515 Nightmare disorder: Secondary | ICD-10-CM

## 2023-07-17 DIAGNOSIS — G2581 Restless legs syndrome: Secondary | ICD-10-CM | POA: Diagnosis not present

## 2023-07-17 MED ORDER — AMITRIPTYLINE HCL 25 MG PO TABS: 25.0000 mg | ORAL_TABLET | Freq: Every day | ORAL | 3 refills | Status: DC

## 2023-07-18 ENCOUNTER — Other Ambulatory Visit: Payer: Self-pay

## 2023-07-18 ENCOUNTER — Other Ambulatory Visit: Payer: Self-pay | Admitting: Family Medicine

## 2023-07-18 DIAGNOSIS — Z1231 Encounter for screening mammogram for malignant neoplasm of breast: Secondary | ICD-10-CM

## 2023-07-18 MED ORDER — AMITRIPTYLINE HCL 25 MG PO TABS: 25.0000 mg | ORAL_TABLET | Freq: Every day | ORAL | 3 refills | Status: DC

## 2023-07-23 ENCOUNTER — Ambulatory Visit: Payer: Medicare Other | Attending: Cardiology | Admitting: Cardiology

## 2023-07-23 ENCOUNTER — Encounter: Payer: Self-pay | Admitting: Cardiology

## 2023-07-23 VITALS — BP 106/70 | HR 86 | Ht 63.0 in | Wt 177.0 lb

## 2023-07-23 DIAGNOSIS — E782 Mixed hyperlipidemia: Secondary | ICD-10-CM | POA: Diagnosis not present

## 2023-07-23 DIAGNOSIS — I10 Essential (primary) hypertension: Secondary | ICD-10-CM | POA: Diagnosis not present

## 2023-07-23 DIAGNOSIS — R0609 Other forms of dyspnea: Secondary | ICD-10-CM | POA: Diagnosis not present

## 2023-07-23 NOTE — Patient Instructions (Signed)
Medication Instructions:   Your physician recommends that you continue on your current medications as directed. Please refer to the Current Medication list given to you today.   *If you need a refill on your cardiac medications before your next appointment, please call your pharmacy*   Lab Work:  TODAY!!!! LIPID/CMET  If you have labs (blood work) drawn today and your tests are completely normal, you will receive your results only by: MyChart Message (if you have MyChart) OR A paper copy in the mail If you have any lab test that is abnormal or we need to change your treatment, we will call you to review the results.   Testing/Procedures:  None ordered.   Follow-Up: At [facility], you and your health needs are our priority.  As part of our continuing mission to provide you with exceptional heart care, we have created designated Provider Care Teams.  These Care Teams include your primary Cardiologist (physician) and Advanced Practice Providers (APPs -  Physician Assistants and Nurse Practitioners) who all work together to provide you with the care you need, when you need it.  We recommend signing up for the patient portal called "MyChart".  Sign up information is provided on this After Visit Summary.  MyChart is used to connect with patients for Virtual Visits (Telemedicine).  Patients are able to view lab/test results, encounter notes, upcoming appointments, etc.  Non-urgent messages can be sent to your provider as well.   To learn more about what you can do with MyChart, go to [email or web address].    Your next appointment:   6 month(s)  Provider:   Perlie Gold, PA-C         Other Instructions  Your physician wants you to follow-up in: 6 months.  You will receive a reminder letter in the mail two months in advance. If you don't receive a letter, please call our office to schedule the follow-up appointment.

## 2023-07-23 NOTE — Progress Notes (Signed)
Cardiology Office Note:   Date:  07/23/2023  ID:  Royal Piedra, DOB 1951/06/13, MRN [id number] PCP: Willow Ora, MD  [facility] Providers Cardiologist:  Christell Constant, MD    History of Present Illness:   Discussed the use of AI scribe software for clinical note transcription with the patient, who gave verbal consent to proceed.  History of Present Illness   Margaret White is a 72 y.o. female with medical history of hypertension, hyperlipidemia, non-alcoholic fatty liver disease, aortic atherosclerosis, DM type II, OSA.  She was last seen in July with complaints of dyspnea on exertion.  In the last year, the patient has experienced significant personal stressors, including the death of a brother and brother-in-law, and the declining health of her spouse. Despite these stressors, the patient reports feeling better. She has not yet followed up with pulmonology. The patient has acquired a puppy, which has increased her physical activity through regular walks. The patient reports a slight improvement in dyspnea, which she attributes to the increased physical activity rather than the use of an inhaler (Symbicort).  The patient acknowledges being a stress eater, with a preference for sweets, and has gained weight while trying to help her spouse gain weight. Despite this, the patient's blood pressure is well controlled, and she is compliant with her medication regimen. The patient has not noticed any side effects from the recently added pravastatin.  In summary, the patient's dyspnea on exertion appears to have improved slightly since the last visit, and she is tolerating more exertion. Today patient denies chest pain, worsening shortness of breath, lower extremity edema, fatigue, palpitations, melena, hematuria, hemoptysis, diaphoresis, weakness, presyncope, syncope, orthopnea, and PND.       Studies Reviewed:    09/19/22 Stress Test    The study is normal. The study is  low risk.   No ST deviation was noted.   Left ventricular function is normal. Nuclear stress EF: 59 %. The left ventricular ejection fraction is normal (55-65%). End diastolic cavity size is normal.   Prior study not available for comparison.  Risk Assessment/Calculations:              Physical Exam:   VS:  BP 106/70 (BP Location: Left Arm, Patient Position: Sitting, Cuff Size: Normal)   Pulse 86   Ht 5\' 3"  (1.6 m)   Wt 177 lb (80.3 kg)   SpO2 95%   BMI 31.35 kg/m    Wt Readings from Last 3 Encounters:  07/23/23 177 lb (80.3 kg)  07/17/23 179 lb (81.2 kg)  07/08/23 178 lb (80.7 kg)     Physical Exam Vitals reviewed.  Constitutional:      Appearance: Normal appearance.  HENT:     Head: Normocephalic.     Nose: Nose normal.  Eyes:     Pupils: Pupils are equal, round, and reactive to light.  Cardiovascular:     Rate and Rhythm: Normal rate and regular rhythm.     Pulses: Normal pulses.     Heart sounds: Normal heart sounds. No murmur heard.    No friction rub. No gallop.  Pulmonary:     Effort: Pulmonary effort is normal.     Breath sounds: Normal breath sounds.  Abdominal:     General: Abdomen is flat.  Musculoskeletal:     Right lower leg: No edema.     Left lower leg: No edema.  Skin:    General: Skin is warm and dry.     Capillary  Refill: Capillary refill takes less than 2 seconds.  Neurological:     General: No focal deficit present.     Mental Status: She is alert and oriented to person, place, and time.  Psychiatric:        Mood and Affect: Mood normal.        Behavior: Behavior normal.        Thought Content: Thought content normal.        Judgment: Judgment normal.       ASSESSMENT AND PLAN:     Assessment and Plan    Dyspnea on Exertion Improved since last visit. Recent normal stress test and echocardiogram. Possible pulmonary component. Patient has increased physical activity with dog walking. -Continue Symbicort as prescribed by  pulmonologist. -Encourage follow-up with pulmonology for potential pulmonary function test.  Hyperlipidemia and Aortic Atherosclerosis LDL last checked was 97, goal is less than 70. Patient on Pravastatin 10mg  and Zetia 10mg . Diet management challenging due to stress and household dietary needs. -Check direct LDL and liver function today. -Continue Pravastatin 10mg  and Zetia 10mg .  Hypertension Well controlled, BP 106/70 today. -Continue Amlodipine 10mg  daily.  Obstructive Sleep Apnea Patient using nasal device, reports improved sleep and energy levels. -Continue current management.  Follow-up Given improvement in symptoms, plan for 41-month follow-up unless changes occur.                 Signed, Perlie Gold, PA-C

## 2023-07-24 LAB — LIPID PANEL
Chol/HDL Ratio: 2.6 {ratio} (ref 0.0–4.4)
Cholesterol, Total: 155 mg/dL (ref 100–199)
HDL: 60 mg/dL (ref >39)
LDL Chol Calc (NIH): 75 mg/dL (ref 0–99)
Triglycerides: 115 mg/dL (ref 0–149)
VLDL Cholesterol Cal: 20 mg/dL (ref 5–40)

## 2023-07-24 LAB — HEPATIC FUNCTION PANEL
ALT: 25 [IU]/L (ref 0–32)
AST: 24 [IU]/L (ref 0–40)
Albumin: 4.2 g/dL (ref 3.9–4.9)
Alkaline Phosphatase: 100 [IU]/L (ref 44–121)
Bilirubin Total: 0.2 mg/dL (ref 0.0–1.2)
Bilirubin, Direct: <0.1 mg/dL (ref 0.00–0.40)
Total Protein: 6.4 g/dL (ref 6.0–8.5)

## 2023-08-16 DIAGNOSIS — H9201 Otalgia, right ear: Secondary | ICD-10-CM | POA: Diagnosis not present

## 2023-08-16 DIAGNOSIS — J189 Pneumonia, unspecified organism: Secondary | ICD-10-CM | POA: Diagnosis not present

## 2023-09-07 ENCOUNTER — Other Ambulatory Visit: Payer: Self-pay | Admitting: Family Medicine

## 2023-09-07 DIAGNOSIS — K219 Gastro-esophageal reflux disease without esophagitis: Secondary | ICD-10-CM

## 2023-09-09 ENCOUNTER — Ambulatory Visit
Admission: RE | Admit: 2023-09-09 | Discharge: 2023-09-09 | Disposition: A | Discharge status: Home / Self Care | Payer: Medicare Other | Source: Ambulatory Visit | Attending: Family Medicine | Admitting: Family Medicine

## 2023-09-09 DIAGNOSIS — Z1231 Encounter for screening mammogram for malignant neoplasm of breast: Secondary | ICD-10-CM

## 2023-09-10 ENCOUNTER — Ambulatory Visit (INDEPENDENT_AMBULATORY_CARE_PROVIDER_SITE_OTHER): Payer: Medicare Other | Admitting: Podiatry

## 2023-09-10 ENCOUNTER — Encounter: Payer: Self-pay | Admitting: Podiatry

## 2023-09-10 DIAGNOSIS — L600 Ingrowing nail: Secondary | ICD-10-CM

## 2023-09-10 NOTE — Patient Instructions (Signed)

## 2023-09-11 NOTE — Progress Notes (Signed)
Chief Complaint  Patient presents with   Ingrown Toenail    Rt 1-3 swell up at times d/t arthritis, per patient. Rt 2nd hallux she says that she thinks there is a ingrown nail. Ongoing for more than a month. Not diabetic, no anticoag therapy.     HPI: 72 y.o. female presents today with pain to right second toe.  Believes she has an ingrown.  Nondiabetic.  This been going on for about 1 month.  Has tenderness to the medial border.  Denies any drainage, redness, signs of infection.  Does have history of previous right hallux ingrown nail.  Does complain that she gets some swelling and pain to the right foot toes 1 through 3 at times, this is not bothering her very much today.  Denies any nausea, vomiting, fever, chills, chest pain, shortness of breath.  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 Shallow disc bulging without significant neuroforaminal stenosis.   SUI (stress urinary incontinence, female)    Vitamin B 12 deficiency    Vitamin D deficiency     Past Surgical History:  Procedure Laterality Date   ABDOMINOPLASTY  2006   ANAL FISSURE REPAIR  2006   BIOPSY  10/27/2020  Procedure: BIOPSY;  Surgeon: Meridee Score Netty Starring., MD;  Location: [facility] ENDOSCOPY;  Service: Gastroenterology;;   BLADDER SURGERY  1984   BREAST BIOPSY Right     benign     CARPAL TUNNEL RELEASE Bilateral 1989   CATARACT EXTRACTION W/ INTRAOCULAR LENS  IMPLANT, BILATERAL  2017   COLONOSCOPY     COLONOSCOPY W/ POLYPECTOMY  2017   ECTOPIC PREGNANCY SURGERY  1979   ESOPHAGOGASTRODUODENOSCOPY (EGD) WITH PROPOFOL N/A 10/27/2020   Procedure: ESOPHAGOGASTRODUODENOSCOPY (EGD) WITH PROPOFOL;  Surgeon: Lemar Lofty., MD;  Location: [facility] ENDOSCOPY;  Service: Gastroenterology;  Laterality: N/A;   EYE SURGERY  child    x4  1957-1960   INCONTINENCE SURGERY     NASAL SEPTUM SURGERY  1987   PUBOVAGINAL SLING N/A 03/24/2019   Procedure: CYSTOSCOPY  Leonides Grills;  Surgeon: Alfredo Martinez, MD;  Location: [facility];  Service: Urology;  Laterality: N/A;   REVISION TOTAL HIP ARTHROPLASTY Right    RHINOPLASTY  2007   TOTAL ABDOMINAL HYSTERECTOMY W/ BILATERAL SALPINGOOPHORECTOMY  1984   w/ Marshall-Marchetti (bladder tack suspension)   TOTAL HIP ARTHROPLASTY Right 04/11/2018   Procedure: RIGHT TOTAL HIP ARTHROPLASTY ANTERIOR APPROACH;  Surgeon: Kathryne Hitch, MD;  Location: [facility];  Service: Orthopedics;  Laterality: Right;   ULNAR NERVE TRANSPOSITION Left 1990s   UPPER ESOPHAGEAL ENDOSCOPIC ULTRASOUND (EUS) N/A 10/27/2020   Procedure: UPPER ESOPHAGEAL ENDOSCOPIC ULTRASOUND (EUS);  Surgeon: Lemar Lofty., MD;  Location: [facility] ENDOSCOPY;  Service: Gastroenterology;  Laterality: N/A;    Allergies  Allergen Reactions   Codeine Itching   Sulfa Antibiotics Rash and Other (See Comments)    Flu like symptoms    ROS    Physical Exam: There were no vitals filed for this visit.  General: The patient is alert and oriented x3 in no acute distress.  Dermatology: Tenderness on palpation of right second toenail medial border.  Associated hyperkeratotic tissue present to this area.  Vascular: Palpable pedal pulses bilaterally. Capillary refill within normal limits.  No appreciable edema.  No erythema or calor.  Neurological: Light touch sensation grossly intact bilateral feet.   Musculoskeletal Exam: No gross pedal deformities noted today.  Muscle strength 5/5 in dorsiflexion, plantarflexion, inversion, eversion.  Mildly elongated second toe.   Assessment/Plan of Care: 1. Ingrown toenail of right foot      No orders of the defined types were placed in this encounter.  None  Discussed clinical findings with patient today.    Ingrown Nail, right -Patient elects to proceed with minor surgery to remove ingrown toenail today. Consent reviewed and signed by patient. -Ingrown nail excised. See procedure note. -Educated on post-procedure care including soaking. Written instructions  provided and reviewed. -Advised on signs and symptoms of infection developing.  We discussed that the chemical application likely will create some redness and edema and tenderness around the nailbed as long as it is localized this is to be expected.  Will return as needed if any infection signs develop -Follow-up in 2 weeks for nail check  Procedure: Excision of Ingrown Toenail Location: Right 2nd toe medial nail borders. Anesthesia: Lidocaine 1% plain; 1.5 mL and Marcaine 0.5% plain; 1.5 mL, digital block. Skin Prep: Betadine. Dressing: Iodosorb; telfa; dry, sterile, compression dressing. Technique: Following skin prep, the toe was exsanguinated and a tourniquet was secured at the base of the toe. The affected nail border was freed, split with a nail splitter, and excised. Chemical matrixectomy was then performed with 2% sodium hydroxide and  irrigated out with white vinegar lavage. The tourniquet was then removed and sterile dressing applied. Disposition: Patient tolerated procedure well.     Rosabelle Jupin L. Marchia Bond, AACFAS [facility]     [address] [place], [place] [address]                Office [phone]  Fax [phone]

## 2023-09-18 ENCOUNTER — Ambulatory Visit (INDEPENDENT_AMBULATORY_CARE_PROVIDER_SITE_OTHER): Payer: Medicare Other

## 2023-09-18 ENCOUNTER — Ambulatory Visit (INDEPENDENT_AMBULATORY_CARE_PROVIDER_SITE_OTHER): Payer: Medicare Other | Admitting: Podiatry

## 2023-09-18 DIAGNOSIS — M79675 Pain in left toe(s): Secondary | ICD-10-CM | POA: Diagnosis not present

## 2023-09-18 DIAGNOSIS — S90122A Contusion of left lesser toe(s) without damage to nail, initial encounter: Secondary | ICD-10-CM | POA: Diagnosis not present

## 2023-09-18 DIAGNOSIS — S92502A Displaced unspecified fracture of left lesser toe(s), initial encounter for closed fracture: Secondary | ICD-10-CM

## 2023-09-18 DIAGNOSIS — S90121A Contusion of right lesser toe(s) without damage to nail, initial encounter: Secondary | ICD-10-CM | POA: Diagnosis not present

## 2023-09-18 DIAGNOSIS — M79674 Pain in right toe(s): Secondary | ICD-10-CM

## 2023-09-18 NOTE — Progress Notes (Signed)
Chief Complaint  Patient presents with   Toe Injury    Left pinky toe hit stubbed on fireplace yesterday, having pain and swelling. Right 2nd toe dropped a glass lid on it a wk ago it is red and swollen and having pain, same toe that had nail procedure done on 09/10/23 with Dr. Jamse Arn   HPI: 72 y.o. female presents today noting pain in the left fifth toe.  States that she struck her toe against the fireplace yesterday and has had pain ever since.  States that it hurts to walk and any pressure on the toe is painful.  Patient stated that while here today, she would like to go ahead and follow-up with her right second toe PNA site.  She notes that she dropped a glass lid on top of the toe 1 to 2 days after the procedure by Dr. Jamse Arn.  She did not call or notify Dr. Jamse Arn after the injury.  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 Shallow disc bulging without significant neuroforaminal stenosis.   SUI (stress urinary incontinence, female)    Vitamin B 12 deficiency    Vitamin D deficiency     Past Surgical History:  Procedure Laterality Date   ABDOMINOPLASTY  2006  ANAL FISSURE REPAIR  2006   BIOPSY  10/27/2020   Procedure: BIOPSY;  Surgeon: Meridee Score Netty Starring., MD;  Location: [facility] ENDOSCOPY;  Service: Gastroenterology;;   BLADDER SURGERY  1984   BREAST BIOPSY Right     benign     CARPAL TUNNEL RELEASE Bilateral 1989   CATARACT EXTRACTION W/ INTRAOCULAR LENS  IMPLANT, BILATERAL  2017   COLONOSCOPY     COLONOSCOPY W/ POLYPECTOMY  2017   ECTOPIC PREGNANCY SURGERY  1979   ESOPHAGOGASTRODUODENOSCOPY (EGD) WITH PROPOFOL N/A 10/27/2020   Procedure: ESOPHAGOGASTRODUODENOSCOPY (EGD) WITH PROPOFOL;  Surgeon: Lemar Lofty., MD;  Location: [facility] ENDOSCOPY;  Service: Gastroenterology;  Laterality: N/A;   EYE SURGERY  child    x4  1957-1960   INCONTINENCE SURGERY     NASAL SEPTUM SURGERY  1987   PUBOVAGINAL SLING N/A 03/24/2019   Procedure: CYSTOSCOPY  Leonides Grills;  Surgeon: Alfredo Martinez, MD;   Location: [facility];  Service: Urology;  Laterality: N/A;   REVISION TOTAL HIP ARTHROPLASTY Right    RHINOPLASTY  2007   TOTAL ABDOMINAL HYSTERECTOMY W/ BILATERAL SALPINGOOPHORECTOMY  1984   w/ Marshall-Marchetti (bladder tack suspension)   TOTAL HIP ARTHROPLASTY Right 04/11/2018   Procedure: RIGHT TOTAL HIP ARTHROPLASTY ANTERIOR APPROACH;  Surgeon: Kathryne Hitch, MD;  Location: [facility];  Service: Orthopedics;  Laterality: Right;   ULNAR NERVE TRANSPOSITION Left 1990s   UPPER ESOPHAGEAL ENDOSCOPIC ULTRASOUND (EUS) N/A 10/27/2020   Procedure: UPPER ESOPHAGEAL ENDOSCOPIC ULTRASOUND (EUS);  Surgeon: Lemar Lofty., MD;  Location: [facility] ENDOSCOPY;  Service: Gastroenterology;  Laterality: N/A;    Allergies  Allergen Reactions   Codeine Itching   Sulfa Antibiotics Rash and Other (See Comments)    Flu like symptoms    Physical Exam: Palpable pedal pulses noted bilateral.  There is pain on palpation to the left fifth toe and with attempted motion of the PIP joint.  Localized edema is noted.  There is ecchymosis on the medial aspect of the fifth toe.  The right second toe P&A site has no clinical signs of infection.  There is some inflammation noted near the proximal nail fold where the chemical matrixectomy had been performed.  No active drainage is seen.  Minimal to no edema is noted . Radiographic Exam (left foot, DP and oblique view, 09/18/2023):  Normal osseous mineralization.  There is a small horizontal cortical break on the medial aspect of the left fifth toe middle phalanx, which does not appear to extend throughout the remainder of the bone.  The fracture is in anatomic alignment.  Assessment/Plan of Care: 1. Closed fracture of phalanx of left fifth toe, initial encounter   2. Contusion of left lesser toe(s) w/o damage to nail, init   3. Contusion of right lesser toe(s) w/o damage to nail, init   4. Pain in toes of both feet    Discussed clinical and  radiographic findings with patient today.  Patient was placed in a surgical shoe for the left foot to wear to limit motion at the MP J and PIPJ.  She noted improvement with attempted weightbearing once this was applied to the foot.  Shall remain in this for approximately 2 weeks and will follow-up in 2 to 3 weeks for rex-ray.  She can follow-up with Dr. Jamse Arn since she still needs to follow-up with him from her PNA site.  Regarding the right second toe nail previous procedure, informed the patient that the area just looks inflamed but no further treatment appears  needed at this time.  Follow-up as scheduled.   Clerance Lav, DPM, FACFAS [facility]     [address] [place], [place] [address]                Office [phone]  Fax [phone]

## 2023-09-19 ENCOUNTER — Other Ambulatory Visit: Payer: Self-pay | Admitting: Family Medicine

## 2023-09-19 DIAGNOSIS — M797 Fibromyalgia: Secondary | ICD-10-CM

## 2023-09-24 ENCOUNTER — Ambulatory Visit (INDEPENDENT_AMBULATORY_CARE_PROVIDER_SITE_OTHER): Payer: Medicare Other

## 2023-09-24 ENCOUNTER — Ambulatory Visit: Payer: Medicare Other | Admitting: Podiatry

## 2023-09-24 ENCOUNTER — Ambulatory Visit (INDEPENDENT_AMBULATORY_CARE_PROVIDER_SITE_OTHER): Payer: Medicare Other | Admitting: Podiatry

## 2023-09-24 ENCOUNTER — Encounter: Payer: Self-pay | Admitting: Podiatry

## 2023-09-24 DIAGNOSIS — S90121A Contusion of right lesser toe(s) without damage to nail, initial encounter: Secondary | ICD-10-CM

## 2023-09-24 DIAGNOSIS — M79674 Pain in right toe(s): Secondary | ICD-10-CM

## 2023-09-24 DIAGNOSIS — S92502D Displaced unspecified fracture of left lesser toe(s), subsequent encounter for fracture with routine healing: Secondary | ICD-10-CM

## 2023-09-24 NOTE — Progress Notes (Unsigned)
Chief Complaint  Patient presents with   Nail Problem    RT 2nd had ingrown removed on 12/3. That healed fine with no issues. However, pt dropped a glass lid on the toe. This happened on either 12/4 or 12/5. Seen 12/11 in our office and was advised no fx. Today, feels soreness in toe, some redness, some swelling, stiff feeling. Has tried ice and epsom soaks. Tried ibuprofen, this offered no relief.    HPI: 72 y.o. female presents for follow-up evaluation of right second toe ingrown toenail which was removed on 12/3.  She is doing well with this.  She was seen last week with Dr. Burna Mortimer after stubbing her left fifth toe at home and was noted to have fracture.  She also reports that in the days following the ingrown toenail procedure she dropped a pot lid on the toes of the right foot and she continues to endorse some soreness, swelling and pain in this area.   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 Shallow disc bulging without significant neuroforaminal stenosis.   SUI (stress urinary incontinence, female)    Vitamin B 12 deficiency    Vitamin D deficiency     Past Surgical History:  Procedure Laterality Date  ABDOMINOPLASTY  2006   ANAL FISSURE REPAIR  2006   BIOPSY  10/27/2020   Procedure: BIOPSY;  Surgeon: Meridee Score Netty Starring., MD;  Location: [facility] ENDOSCOPY;  Service: Gastroenterology;;   BLADDER SURGERY  1984   BREAST BIOPSY Right     benign     CARPAL TUNNEL RELEASE Bilateral 1989   CATARACT EXTRACTION W/ INTRAOCULAR LENS  IMPLANT, BILATERAL  2017   COLONOSCOPY     COLONOSCOPY W/ POLYPECTOMY  2017   ECTOPIC PREGNANCY SURGERY  1979   ESOPHAGOGASTRODUODENOSCOPY (EGD) WITH PROPOFOL N/A 10/27/2020   Procedure: ESOPHAGOGASTRODUODENOSCOPY (EGD) WITH PROPOFOL;  Surgeon: Lemar Lofty., MD;  Location: [facility] ENDOSCOPY;  Service: Gastroenterology;  Laterality: N/A;   EYE SURGERY  child    x4  1957-1960   INCONTINENCE SURGERY     NASAL SEPTUM SURGERY  1987   PUBOVAGINAL SLING N/A 03/24/2019    Procedure: CYSTOSCOPY  Leonides Grills;  Surgeon: Alfredo Martinez, MD;  Location: [facility];  Service: Urology;  Laterality: N/A;   REVISION TOTAL HIP ARTHROPLASTY Right    RHINOPLASTY  2007   TOTAL ABDOMINAL HYSTERECTOMY W/ BILATERAL SALPINGOOPHORECTOMY  1984   w/ Marshall-Marchetti (bladder tack suspension)   TOTAL HIP ARTHROPLASTY Right 04/11/2018   Procedure: RIGHT TOTAL HIP ARTHROPLASTY ANTERIOR APPROACH;  Surgeon: Kathryne Hitch, MD;  Location: [facility];  Service: Orthopedics;  Laterality: Right;   ULNAR NERVE TRANSPOSITION Left 1990s   UPPER ESOPHAGEAL ENDOSCOPIC ULTRASOUND (EUS) N/A 10/27/2020   Procedure: UPPER ESOPHAGEAL ENDOSCOPIC ULTRASOUND (EUS);  Surgeon: Lemar Lofty., MD;  [facility]COPY;  Service: Gastroenterology;  Laterality: N/A;    Allergies  Allergen Reactions   Codeine Itching   Sulfa Antibiotics Rash and Other (See Comments)    Flu like symptoms    Physical Exam: Palpable pedal pulses noted bilateral.  Surgical shoe left in place to left foot.  Doing well with this.  The right second toe P&A site has no clinical signs of infection.  This is healing well.  Stable scab noted.  No drainage.  There is still pain without ecchymosis and minimal swelling to the right foot first, second, third toes, no erythema, no focal warmth increased no signs of infection.  No gross acute deformity.  Right foot bunion deformities appreciated with decreased range of motion, without pain or crepitus.  Radiographic Exam (right foot, DP, oblique view, lateral view 09/24/2023):  Normal osseous mineralization.  No evidence of acute fracture or acute deformity appreciated.  Bunion deformity present with arthritic changes to the first MPJ with focal loss of joint space medial aspect and some reactive spurring deviation of the joint is noted. Some arthritic changes to the right 3rd MPJ, medial aspect.  Assessment/Plan of Care: 1. Pain of toe of  right foot   2. Contusion of right lesser toe(s) w/o damage to nail, init   3. Closed fracture of phalanx of left fifth toe with routine healing, subsequent encounter    Discussed clinical and radiographic findings with patient today.  -Routine healing of PNA site, doing well - Continue ambulating to tolerance and postop shoe left foot.  Patient may keep her follow-up with Dr. Burna Mortimer for this -No acute injuries appreciated to the toes of the right foot from the initial injury - Advised the use of a stiff soled shoe, over-the-counter Tylenol and ibuprofen as needed, may lightly apply Coban for compression if she experiences swelling as needed. -Discussed radiograph findings of bunion deformity and chronic appearing arthritis with the patient.  This does not bother her very much at this time, would like to see her back as needed for this when she is fully recovered from the nail avulsion site.   Bronwen Betters your, DPM, AACFAS [facility]     [address] [place], [place] [address]                Office [phone]  Fax [phone]

## 2023-09-26 DIAGNOSIS — S61211A Laceration without foreign body of left index finger without damage to nail, initial encounter: Secondary | ICD-10-CM | POA: Diagnosis not present

## 2023-09-28 DIAGNOSIS — R070 Pain in throat: Secondary | ICD-10-CM | POA: Diagnosis not present

## 2023-09-28 DIAGNOSIS — J0100 Acute maxillary sinusitis, unspecified: Secondary | ICD-10-CM | POA: Diagnosis not present

## 2023-09-28 DIAGNOSIS — R519 Headache, unspecified: Secondary | ICD-10-CM | POA: Diagnosis not present

## 2023-10-01 DIAGNOSIS — R0981 Nasal congestion: Secondary | ICD-10-CM | POA: Diagnosis not present

## 2023-10-01 DIAGNOSIS — R051 Acute cough: Secondary | ICD-10-CM | POA: Diagnosis not present

## 2023-10-04 ENCOUNTER — Ambulatory Visit (INDEPENDENT_AMBULATORY_CARE_PROVIDER_SITE_OTHER): Payer: Medicare Other | Admitting: Podiatry

## 2023-10-04 ENCOUNTER — Ambulatory Visit: Payer: Medicare Other

## 2023-10-04 DIAGNOSIS — S92502D Displaced unspecified fracture of left lesser toe(s), subsequent encounter for fracture with routine healing: Secondary | ICD-10-CM | POA: Diagnosis not present

## 2023-10-04 DIAGNOSIS — S92502A Displaced unspecified fracture of left lesser toe(s), initial encounter for closed fracture: Secondary | ICD-10-CM | POA: Diagnosis not present

## 2023-10-04 NOTE — Progress Notes (Unsigned)
Chief Complaint  Patient presents with   Fracture    Pt here today for recheck on LT 5th closed frx.     HPI: 72 y.o. female presents today for follow-up of left fifth toe middle phalanx fracture.  She has been wearing her surgical shoe for the past 2 weeks.  She states that she is not having any pain at this time.  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 Shallow disc bulging without significant neuroforaminal stenosis.   SUI (stress urinary incontinence, female)    Vitamin B 12 deficiency    Vitamin D deficiency     Past Surgical History:  Procedure Laterality Date   ABDOMINOPLASTY  2006   ANAL FISSURE REPAIR  2006   BIOPSY  10/27/2020   Procedure: BIOPSY;  Surgeon: Meridee Score Netty Starring., MD;  Location: [facility] ENDOSCOPY;  Service: Gastroenterology;;   BLADDER SURGERY  1984   BREAST BIOPSY Right     benign     CARPAL TUNNEL RELEASE Bilateral 1989   CATARACT EXTRACTION W/ INTRAOCULAR LENS  IMPLANT, BILATERAL  2017   COLONOSCOPY     COLONOSCOPY W/ POLYPECTOMY  2017   ECTOPIC PREGNANCY SURGERY  1979   ESOPHAGOGASTRODUODENOSCOPY (EGD) WITH PROPOFOL N/A 10/27/2020  Procedure: ESOPHAGOGASTRODUODENOSCOPY (EGD) WITH PROPOFOL;  Surgeon: Meridee Score Netty Starring., MD;  Location: [facility] ENDOSCOPY;  Service: Gastroenterology;  Laterality: N/A;   EYE SURGERY  child    x4  1957-1960   INCONTINENCE SURGERY     NASAL SEPTUM SURGERY  1987   PUBOVAGINAL SLING N/A 03/24/2019   Procedure: CYSTOSCOPY  Leonides Grills;  Surgeon: Alfredo Martinez, MD;  [facility];  Service: Urology;  Laterality: N/A;   REVISION TOTAL HIP ARTHROPLASTY Right    RHINOPLASTY  2007   TOTAL ABDOMINAL HYSTERECTOMY W/ BILATERAL SALPINGOOPHORECTOMY  1984   w/ Marshall-Marchetti (bladder tack suspension)   TOTAL HIP ARTHROPLASTY Right 04/11/2018   Procedure: RIGHT TOTAL HIP ARTHROPLASTY ANTERIOR APPROACH;  Surgeon: Kathryne Hitch, MD;  Location: [facility];  Service: Orthopedics;  Laterality: Right;   ULNAR NERVE  TRANSPOSITION Left 1990s   UPPER ESOPHAGEAL ENDOSCOPIC ULTRASOUND (EUS) N/A 10/27/2020   Procedure: UPPER ESOPHAGEAL ENDOSCOPIC ULTRASOUND (EUS);  Surgeon: Lemar Lofty., MD;  Location: [facility] ENDOSCOPY;  Service: Gastroenterology;  Laterality: N/A;    Allergies  Allergen Reactions   Codeine Itching   Sulfa Antibiotics Rash and Other (See Comments)    Flu like symptoms    Physical Exam: Palpable pedal pulses noted.  There is no edema to the left fifth toe.  No ecchymosis is noted.  There is no pain on palpation of the area.  No pain with range of motion of the MPJ or PIPJ.  Radiographic Exam, left foot, 3 weightbearing views, 10/04/2023.:  Normal osseous mineralization.  The left fifth toe middle phalanx fracture is showing some resorptive radiolucency at this time near the base of the middle phalanx horizontally.  It appears to be showing adequate signs of bone healing.  Assessment/Plan of Care: 1. Closed fracture of phalanx of left fifth toe with routine healing, subsequent encounter   2. Closed fracture of phalanx of left fifth toe, initial encounter     Discussed clinical and radiographic findings with patient today.  Informed the patient that she does not have complete healing radiographically at this time, but it is improving.  We were in agreement to have her continue with the surgical shoe for 1 additional week and then she should be okay to proceed with regular shoe gear especially since she is asymptomatic clinically at this time.  Follow-up as needed   Clerance Lav, DPM, FACFAS [facility]     [address] [place], [place] [address]                Office [phone]  Fax [phone]

## 2023-12-11 DIAGNOSIS — L299 Pruritus, unspecified: Secondary | ICD-10-CM | POA: Diagnosis not present

## 2023-12-11 DIAGNOSIS — L300 Nummular dermatitis: Secondary | ICD-10-CM | POA: Diagnosis not present

## 2023-12-11 DIAGNOSIS — L219 Seborrheic dermatitis, unspecified: Secondary | ICD-10-CM | POA: Diagnosis not present

## 2023-12-11 DIAGNOSIS — L853 Xerosis cutis: Secondary | ICD-10-CM | POA: Diagnosis not present

## 2023-12-18 ENCOUNTER — Ambulatory Visit (INDEPENDENT_AMBULATORY_CARE_PROVIDER_SITE_OTHER): Admitting: Physician Assistant

## 2023-12-18 ENCOUNTER — Ambulatory Visit: Payer: Self-pay | Admitting: Family Medicine

## 2023-12-18 VITALS — BP 110/70 | HR 87 | Temp 97.7°F | Resp 14 | Ht 63.0 in | Wt 183.0 lb

## 2023-12-18 DIAGNOSIS — R7303 Prediabetes: Secondary | ICD-10-CM | POA: Diagnosis not present

## 2023-12-18 DIAGNOSIS — M7632 Iliotibial band syndrome, left leg: Secondary | ICD-10-CM

## 2023-12-18 MED ORDER — PREDNISONE 20 MG PO TABS
ORAL_TABLET | ORAL | 0 refills | Status: AC
Start: 2023-12-18 — End: 2023-12-27

## 2023-12-18 NOTE — Progress Notes (Signed)
 Acute Office Visit  Subjective:    Patient ID: Margaret White, female    DOB: 11-07-1950, 73 y.o.   MRN: [id number]  Chief Complaint  Patient presents with   Hip Pain    Left    HPI: Patient is in today for left hip pain since one month. She mentioned sharp pain with some  positions. Laying on her back does not hurt, but laying on the hip is hurts. She takes tylenol.  Discussed the use of AI scribe software for clinical note transcription with the patient, who gave verbal consent to proceed.  History of Present Illness   The patient, with a history of right hip replacement due to arthritis and necrosis, presents with worsening left hip pain that started about a month ago. The pain is described as sharp and burning, and is severe enough to wake the patient up at night. The pain is exacerbated by going up steps or getting into a vehicle. The patient has been managing the pain with Tylenol. The patient also reports experiencing pain when walking her dog on uneven ground. The patient has been trying to maintain physical activity by walking her dog and doing chair yoga and other stretching exercises at home. The patient also mentions having a fatty liver.       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 Elige Radon , lov note in epic,  stable   Spondylosis of lumbar region without  myelopathy or radiculopathy 11/04/2017   MRI 11/01/2017: Severe L4-5 facet arthrosis on the right greater than left. Shallow disc bulging without significant neuroforaminal stenosis.   SUI (stress urinary incontinence, female)    Vitamin B 12 deficiency    Vitamin D deficiency     Past Surgical History:  Procedure Laterality Date   ABDOMINOPLASTY  2006   ANAL FISSURE REPAIR  2006   BIOPSY  10/27/2020   Procedure: BIOPSY;  Surgeon: Meridee Score Netty Starring., MD;  Location: [facility] ENDOSCOPY;  Service: Gastroenterology;;   BLADDER SURGERY  1984   BREAST BIOPSY Right     benign     CARPAL TUNNEL RELEASE Bilateral 1989   CATARACT EXTRACTION W/ INTRAOCULAR LENS  IMPLANT, BILATERAL  2017   COLONOSCOPY     COLONOSCOPY W/ POLYPECTOMY  2017   ECTOPIC PREGNANCY SURGERY  1979   ESOPHAGOGASTRODUODENOSCOPY (EGD) WITH PROPOFOL N/A  10/27/2020   Procedure: ESOPHAGOGASTRODUODENOSCOPY (EGD) WITH PROPOFOL;  Surgeon: Lemar Lofty., MD;  Location: [facility] ENDOSCOPY;  Service: Gastroenterology;  Laterality: N/A;   EYE SURGERY  child    x4  1957-1960   INCONTINENCE SURGERY     NASAL SEPTUM SURGERY  1987   PUBOVAGINAL SLING N/A 03/24/2019   Procedure: CYSTOSCOPY  Leonides Grills;  Surgeon: Alfredo Martinez, MD;  Location: [facility];  Service: Urology;  Laterality: N/A;   REVISION TOTAL HIP ARTHROPLASTY Right    RHINOPLASTY  2007   TOTAL ABDOMINAL HYSTERECTOMY W/ BILATERAL SALPINGOOPHORECTOMY  1984   w/ Marshall-Marchetti (bladder tack suspension)   TOTAL HIP ARTHROPLASTY Right 04/11/2018   Procedure: RIGHT TOTAL HIP ARTHROPLASTY ANTERIOR APPROACH;  Surgeon: Kathryne Hitch, MD;  Location: [facility];  Service: Orthopedics;  Laterality: Right;   ULNAR NERVE TRANSPOSITION Left 1990s   UPPER ESOPHAGEAL ENDOSCOPIC ULTRASOUND (EUS) N/A 10/27/2020   Procedure: UPPER ESOPHAGEAL ENDOSCOPIC ULTRASOUND (EUS);  Surgeon: Lemar Lofty., MD;  Location: [facility] ENDOSCOPY;  Service: Gastroenterology;  Laterality: N/A;    Family History  Problem Relation Age of Onset   Hypertension Mother    Congestive Heart Failure Mother    Multiple myeloma Mother    Heart disease Father    Prostate cancer Father    Emphysema Sister    Heart disease Brother    Other Brother        brain tumor   Heart disease Maternal Grandmother    Emphysema Maternal Grandmother    CAD Maternal Grandfather    Heart disease Maternal Grandfather    Cancer Paternal Grandmother        type unknown, mets   Alcoholism Paternal Grandfather    Colon cancer Neg Hx    Esophageal cancer Neg Hx    Stomach cancer Neg Hx    Rectal cancer Neg Hx     Social History   Socioeconomic History   Marital status: Married    Spouse name: Not on file   Number of children: 4   Years of education: Not on file   Highest education level: Not on  file  Occupational History   Occupation: TEACHER    Employer: [employer]    Comment: retired  Tobacco Use   Smoking status: Never   Smokeless tobacco: Never  Vaping Use   Vaping status: Never Used  Substance and Sexual Activity   Alcohol use: No   Drug use: Never   Sexual activity: Yes    Birth control/protection: Surgical  Other Topics Concern   Not on file  Social History Narrative  Not on file   Social Drivers of Health   Financial Resource Strain: Low Risk  (02/18/2023)   Overall Financial Resource Strain (CARDIA)    Difficulty of Paying Living Expenses: Not hard at all  Food Insecurity: No Food Insecurity (02/18/2023)   Hunger Vital Sign    Worried About Running Out of Food in the Last Year: Never true    Ran Out of Food in the Last Year: Never true  Transportation Needs: No Transportation Needs (02/18/2023)   PRAPARE - Administrator, Civil Service (Medical): No    Lack of Transportation (Non-Medical): No  Physical Activity: Inactive (02/18/2023)   Exercise Vital Sign    Days of Exercise per Week: 0 days    Minutes of Exercise per Session: 0 min  Stress: No Stress Concern Present (02/18/2023)   [employer] of Occupational Health - Occupational Stress Questionnaire    Feeling of Stress : Not at all  Social Connections: Moderately Integrated (02/18/2023)   Social Connection and Isolation Panel [NHANES]    Frequency of Communication with Friends and Family: More than three times a week    Frequency of Social Gatherings with Friends and Family: More than three times a week    Attends Religious Services: More than 4 times per year    Active Member of [employer] or Organizations: No    Attends Banker Meetings: Never    Marital Status: Married  Catering manager Violence: Not At Risk (02/18/2023)   Humiliation, Afraid, Rape, and Kick questionnaire    Fear of Current or Ex-Partner: No    Emotionally Abused: No    Physically Abused: No     Sexually Abused: No    Outpatient Medications Prior to Visit  Medication Sig Dispense Refill   amitriptyline (ELAVIL) 25 MG tablet Take 1 tablet (25 mg total) by mouth at bedtime. 90 tablet 3   amLODipine (NORVASC) 10 MG tablet Take 1 tablet (10 mg total) by mouth daily. 90 tablet 3   budesonide-formoterol (SYMBICORT) 160-4.5 MCG/ACT inhaler Inhale 2 puffs into the lungs 2 (two) times daily. 1 each 11   calcium carbonate (OS-CAL) 600 MG TABS tablet Take 600 mg by mouth daily with breakfast.     DULoxetine (CYMBALTA) 60 MG capsule TAKE 1 CAPSULE DAILY (REPLACING 30 MG PRESCRIPTION) 90 capsule 3   esomeprazole (NEXIUM) 40 MG capsule TAKE 1 CAPSULE (40 MG TOTAL) BY MOUTH 2 (TWO) TIMES DAILY BEFORE A MEAL. 180 capsule 3   ezetimibe (ZETIA) 10 MG tablet TAKE 1 TABLET DAILY 90 tablet 3   FLUoxetine (PROZAC) 10 MG tablet Take 1 tablet (10 mg total) by mouth daily. 90 tablet 3   fluticasone (FLONASE) 50 MCG/ACT nasal spray USE 1 SPRAY IN EACH NOSTRIL DAILY AS NEEDED FOR ALLERGIES 48 g 5   gabapentin (NEURONTIN) 300 MG capsule TAKE 1 CAPSULE THREE TIMES A DAY 270 capsule 3   levocetirizine (XYZAL) 5 MG tablet Take 5 mg by mouth 2 (two) times daily.     Polyvinyl Alcohol-Povidone PF (REFRESH) 1.4-0.6 % SOLN Place 1 drop into both eyes daily as needed (Dry eye).     pravastatin (PRAVACHOL) 10 MG tablet Take 1 tablet (10 mg total) by mouth daily. 90 tablet 3   triamcinolone cream (KENALOG) 0.1 %      No facility-administered medications prior to visit.    Allergies  Allergen Reactions   Codeine Itching   Sulfa Antibiotics Rash and Other (See Comments)    Flu like symptoms  Review of Systems  Constitutional:  Negative for chills, fatigue and fever.  HENT:  Negative for congestion, ear pain and sore throat.   Respiratory:  Negative for cough and shortness of breath.   Cardiovascular:  Negative for chest pain and palpitations.  Gastrointestinal:  Negative for abdominal pain, constipation,  diarrhea, nausea and vomiting.  Endocrine: Negative for polydipsia, polyphagia and polyuria.  Genitourinary:  Negative for difficulty urinating and dysuria.  Musculoskeletal:  Positive for arthralgias (left hip pain). Negative for back pain and myalgias.  Skin:  Negative for rash.  Neurological:  Negative for headaches.  Psychiatric/Behavioral:  Negative for dysphoric mood. The patient is not nervous/anxious.        Objective:        12/18/2023    3:11 PM 07/23/2023    9:06 AM 07/17/2023    7:56 AM  Vitals with BMI  Height 5\' 3"  5\' 3"  5\' 4"   Weight 183 lbs 177 lbs 179 lbs  BMI 32.43 31.36 30.71  Systolic 110 106 295  Diastolic 70 70 77  Pulse 87 86 87    No data found.   Physical Exam Vitals reviewed.  Constitutional:      Appearance: Normal appearance.  Cardiovascular:     Rate and Rhythm: Normal rate and regular rhythm.     Heart sounds: Normal heart sounds.  Pulmonary:     Effort: Pulmonary effort is normal.     Breath sounds: Normal breath sounds.  Abdominal:     General: Bowel sounds are normal.     Palpations: Abdomen is soft.     Tenderness: There is no abdominal tenderness.  Musculoskeletal:     Left hip: Tenderness present. Normal strength.     Comments: Pain along the Iliotibial band from hip to knee.   Neurological:     Mental Status: She is alert and oriented to person, place, and time.  Psychiatric:        Mood and Affect: Mood normal.        Behavior: Behavior normal.     Health Maintenance Due  Topic Date Due   Medicare Annual Wellness (AWV)  02/18/2024    There are no preventive care reminders to display for this patient.   Lab Results  Component Value Date   TSH 2.70 03/05/2023   Lab Results  Component Value Date   WBC 5.9 03/05/2023   HGB 13.5 03/05/2023   HCT 41.7 03/05/2023   MCV 87.6 03/05/2023   PLT 331.0 03/05/2023   Lab Results  Component Value Date   NA 141 03/05/2023   K 4.0 03/05/2023   CO2 27 03/05/2023   GLUCOSE  98 03/05/2023   BUN 17 03/05/2023   CREATININE 1.14 03/05/2023   BILITOT 0.2 07/23/2023   ALKPHOS 100 07/23/2023   AST 24 07/23/2023   ALT 25 07/23/2023   PROT 6.4 07/23/2023   ALBUMIN 4.2 07/23/2023   CALCIUM 9.5 03/05/2023   ANIONGAP 13 04/09/2022   GFR 48.78 (L) 03/05/2023   Lab Results  Component Value Date   CHOL 155 07/23/2023   Lab Results  Component Value Date   HDL 60 07/23/2023   Lab Results  Component Value Date   LDLCALC 75 07/23/2023   Lab Results  Component Value Date   TRIG 115 07/23/2023   Lab Results  Component Value Date   CHOLHDL 2.6 07/23/2023   Lab Results  Component Value Date   HGBA1C 6.2 03/05/2023       Assessment & Plan:  Iliotibial band syndrome of left side Assessment & Plan: Worsening hip pain with burning and sharp characteristics, exacerbated by specific activities. Pain localized from hip to knee, indicating iliotibial band involvement. Inflammation present, ruled out severe conditions. Discussed treatment options including oral steroids and physical therapy. - Initiate prednisone taper, monitor blood sugar due to borderline diabetes. - Refer to physical therapy for stretching and strengthening exercises. - Advise alternating ice and heat therapy, starting with ice for two days. - Recommend pillow between knees while sleeping. - Suggest using a tennis ball to massage the iliotibial band. - Limit Tylenol use due to fatty liver disease. - Consider Tylenol PM before bed for sleep aid. - Advise on specific stretching exercises.  Orders: -     Ambulatory referral to Physical Therapy -     predniSONE; Take 3 tablets (60 mg total) by mouth daily with breakfast for 3 days, THEN 2 tablets (40 mg total) daily with breakfast for 3 days, THEN 1 tablet (20 mg total) daily with breakfast for 3 days.  Dispense: 18 tablet; Refill: 0  Prediabetes Assessment & Plan: Considered in management of iliotibial band syndrome due to potential blood  sugar increase from prednisone. - Monitor blood sugar levels during prednisone treatment.      Fatty Liver Disease Relevant to medication management, particularly Tylenol use. - Limit Tylenol use to necessary occasions to avoid liver strain.        Meds ordered this encounter  Medications   predniSONE (DELTASONE) 20 MG tablet    Sig: Take 3 tablets (60 mg total) by mouth daily with breakfast for 3 days, THEN 2 tablets (40 mg total) daily with breakfast for 3 days, THEN 1 tablet (20 mg total) daily with breakfast for 3 days.    Dispense:  18 tablet    Refill:  0    Orders Placed This Encounter  Procedures   Ambulatory referral to Physical Therapy     Follow-up: No follow-ups on file.  An After Visit Summary was printed and given to the patient.  Langley Gauss, [facility] [phone]

## 2023-12-18 NOTE — Telephone Encounter (Signed)
 Noted.

## 2023-12-18 NOTE — Telephone Encounter (Signed)
 Chief Complaint: Left hip pain Symptoms: Sharp pain/burning pain Frequency: one month Pertinent Negatives: Patient denies numbness or tingling in leg, leg pain, injury Disposition: [] ED /[] Urgent Care (no appt availability in office) / [x] Appointment(In office/virtual)/ []  [facility]/ [] Home Care/ [] Refused Recommended Disposition /[] [facility]/ []  Follow-up with PCP Additional Notes: Patient called stating she has been experiencing pain in her left hip for about one month. Patient first noticed pain when she was standing up after using restroom and felt a sudden sharp pain in her left hip. Patient states she feels pain worsened when she is lying on right or left hip, but it relieves when she sits or stands. Patient states it sets in with aggravation and pain if she ambulates for long periods of time. Patient appt made for today for further evaluation.    Copied from CRM [id number]. Topic: Clinical - Red Word Triage >> Dec 18, 2023 12:57 PM Isabell A wrote: Red Word that prompted transfer to Nurse Triage: Extreme/severe pain in hip, when laying down the pain is really bad. Reason for Disposition  [1] MODERATE pain (e.g., interferes with normal activities, limping) AND [2] present > 3 days  Answer Assessment - Initial Assessment Questions 1. LOCATION and RADIATION: "Where is the pain located?"      Left hip pain 2. QUALITY: "What does the pain feel like?"  (e.g., sharp, dull, aching, burning)     Feels like muscular pain, sometimes burning 3. SEVERITY: "How bad is the pain?" "What does it keep you from doing?"   (Scale 1-10; or mild, moderate, severe)   -  MILD (1-3): doesn't interfere with normal activities    -  MODERATE (4-7): interferes with normal activities (e.g., work or school) or awakens from sleep, limping    -  SEVERE (8-10): excruciating pain, unable to do any normal activities, unable to walk     10 4. ONSET: "When did the pain start?" "Does it come and go,  or is it there all the time?"     A month 5. WORK OR EXERCISE: "Has there been any recent work or exercise that involved this part of the body?"      No 6. CAUSE: "What do you think is causing the hip pain?"      Once standing up from using bathroom and had sharp pain 7. AGGRAVATING FACTORS: "What makes the hip pain worse?" (e.g., walking, climbing stairs, running)     Laying on hip makes it worse, eases up if she gets up  8. OTHER SYMPTOMS: "Do you have any other symptoms?" (e.g., back pain, pain shooting down leg,  fever, rash)     Occasional pinch of pain in center of back  Protocols used: Hip Pain-A-AH

## 2023-12-23 ENCOUNTER — Encounter: Payer: Self-pay | Admitting: Physician Assistant

## 2023-12-23 DIAGNOSIS — M7632 Iliotibial band syndrome, left leg: Secondary | ICD-10-CM | POA: Insufficient documentation

## 2023-12-23 NOTE — Assessment & Plan Note (Signed)
 Considered in management of iliotibial band syndrome due to potential blood sugar increase from prednisone. - Monitor blood sugar levels during prednisone treatment.

## 2023-12-23 NOTE — Assessment & Plan Note (Signed)
 Worsening hip pain with burning and sharp characteristics, exacerbated by specific activities. Pain localized from hip to knee, indicating iliotibial band involvement. Inflammation present, ruled out severe conditions. Discussed treatment options including oral steroids and physical therapy. - Initiate prednisone taper, monitor blood sugar due to borderline diabetes. - Refer to physical therapy for stretching and strengthening exercises. - Advise alternating ice and heat therapy, starting with ice for two days. - Recommend pillow between knees while sleeping. - Suggest using a tennis ball to massage the iliotibial band. - Limit Tylenol use due to fatty liver disease. - Consider Tylenol PM before bed for sleep aid. - Advise on specific stretching exercises.

## 2023-12-25 DIAGNOSIS — M7632 Iliotibial band syndrome, left leg: Secondary | ICD-10-CM | POA: Diagnosis not present

## 2023-12-25 DIAGNOSIS — R2689 Other abnormalities of gait and mobility: Secondary | ICD-10-CM | POA: Diagnosis not present

## 2023-12-25 DIAGNOSIS — M79605 Pain in left leg: Secondary | ICD-10-CM | POA: Diagnosis not present

## 2023-12-26 ENCOUNTER — Telehealth: Payer: Self-pay | Admitting: Physician Assistant

## 2023-12-26 NOTE — Telephone Encounter (Signed)
 Eval date 12/21/23 2 times a week for 6 weeks.

## 2024-01-01 DIAGNOSIS — R2689 Other abnormalities of gait and mobility: Secondary | ICD-10-CM | POA: Diagnosis not present

## 2024-01-01 DIAGNOSIS — M7632 Iliotibial band syndrome, left leg: Secondary | ICD-10-CM | POA: Diagnosis not present

## 2024-01-01 DIAGNOSIS — M79605 Pain in left leg: Secondary | ICD-10-CM | POA: Diagnosis not present

## 2024-01-03 DIAGNOSIS — M79605 Pain in left leg: Secondary | ICD-10-CM | POA: Diagnosis not present

## 2024-01-03 DIAGNOSIS — M7632 Iliotibial band syndrome, left leg: Secondary | ICD-10-CM | POA: Diagnosis not present

## 2024-01-03 DIAGNOSIS — R2689 Other abnormalities of gait and mobility: Secondary | ICD-10-CM | POA: Diagnosis not present

## 2024-01-14 DIAGNOSIS — H5015 Alternating exotropia: Secondary | ICD-10-CM | POA: Diagnosis not present

## 2024-01-14 DIAGNOSIS — H524 Presbyopia: Secondary | ICD-10-CM | POA: Diagnosis not present

## 2024-01-14 DIAGNOSIS — H43393 Other vitreous opacities, bilateral: Secondary | ICD-10-CM | POA: Diagnosis not present

## 2024-01-14 DIAGNOSIS — G43B0 Ophthalmoplegic migraine, not intractable: Secondary | ICD-10-CM | POA: Diagnosis not present

## 2024-01-14 DIAGNOSIS — H16223 Keratoconjunctivitis sicca, not specified as Sjogren's, bilateral: Secondary | ICD-10-CM | POA: Diagnosis not present

## 2024-01-14 DIAGNOSIS — H353130 Nonexudative age-related macular degeneration, bilateral, stage unspecified: Secondary | ICD-10-CM | POA: Diagnosis not present

## 2024-01-14 DIAGNOSIS — Z961 Presence of intraocular lens: Secondary | ICD-10-CM | POA: Diagnosis not present

## 2024-01-27 ENCOUNTER — Ambulatory Visit: Payer: Self-pay

## 2024-01-27 NOTE — Telephone Encounter (Signed)
 Please schedule pt sooner if able.

## 2024-01-27 NOTE — Telephone Encounter (Signed)
Appt scheduled 4/24

## 2024-01-27 NOTE — Telephone Encounter (Signed)
 Copied from CRM [id number]. Topic: Clinical - Red Word Triage >> Jan 27, 2024  8:19 AM Albertha Alosa wrote: [facility] that prompted transfer to Nurse Triage: Patient stated she has been having some symptoms, been uncoordinated, mental confusion, pain and burning when she walks, and also has had a funny taste in her mouth like bittersweet  Chief Complaint: neurologic deficit, confusion, pain Symptoms: uncoordinated gait, when walking steps travel in non-straight line, clumsy, 8/10 low back pain that comes and goes, pain & burning b/w shoulder blades Frequency: months and worsening x 1 week Pertinent Negatives: Patient denies fever, SOB Disposition: [] ED /[] Urgent Care (no appt availability in office) / [x] Appointment(In office/virtual)/ []  [facility]/ [] Home Care/ [] Refused Recommended Disposition /[] [facility]/ []  Follow-up with PCP Additional Notes: Over the last several months pt has been notices these issues occurring but they occurred every now and then but in the last week the issues are becoming more constant and last longer - pt is now concerned and would like to be evaluated.   In the last week pain & burning b/w shoulder blade - occurs if stand to long or walk to long. Tongue feels to large for mouth causes pt to slurr/mumble words. Pt stated kids have not noticed therefore not sure if it really causes her to slurr/mumble.   Has a bittersweet taste in mouth. Mental confusion:ex. Patient knows where she is suppose to be driving but still missing all the turns. Pt reports blurred vision at times & overall weakness  Reason for Disposition  [1] MODERATE pain (e.g., interferes with normal activities) AND [2] present > 3 days  [1] MODERATE back pain (e.g., interferes with normal activities) AND [2] present > 3 days  [1] Longstanding confusion (e.g., dementia, stroke) AND [2] NO worsening or change  Answer Assessment - Initial Assessment Questions 1. LEVEL OF CONSCIOUSNESS: "How  is he (she, the patient) acting right now?" (e.g., alert-oriented, confused, lethargic, stuporous, comatose)     Pt is alert, oriented however has confusion when completing tasks 2. ONSET: "When did the confusion start?"  (minutes, hours, days)     Ongoing last couple of months occasional then last x 1 week constant 3. PATTERN "Does this come and go, or has it been constant since it started?"  "Is it present now?"     More constant  4. ALCOHOL or DRUGS: "Has he been drinking alcohol or taking any drugs?"      N/a 5. NARCOTIC MEDICINES: "Has he been receiving any narcotic medications?" (e.g., morphine , Vicodin)     N/a 6. CAUSE: "What do you think is causing the confusion?"      unknown 7. OTHER SYMPTOMS: "Are there any other symptoms?" (e.g., difficulty breathing, headache, fever, weakness)     Weakness all over  Answer Assessment - Initial Assessment Questions 1. SYMPTOM: "What is the main symptom you are concerned about?" (e.g., weakness, numbness)     Numbness in lips x 2 weeks, tingling sensation in left foot 2. ONSET: "When did this start?" (minutes, hours, days; while sleeping)     Last couple months and worse 3. LAST NORMAL: "When was the last time you (the patient) were normal (no symptoms)?"     unknown 4. PATTERN "Does this come and go, or has it been constant since it started?"  "Is it present now?"     Present all the time last week or two 5. CARDIAC SYMPTOMS: "Have you had any of the following symptoms: chest pain,  difficulty breathing, palpitations?"     no 6. NEUROLOGIC SYMPTOMS: "Have you had any of the following symptoms: headache, dizziness, vision loss, double vision, changes in speech, unsteady on your feet?"     Double vision at times r/t migraines, unsteady on feet, feels like tongue is too big causes patient to mumble & slurr speak, right eye is not as large  7. OTHER SYMPTOMS: "Do you have any other symptoms?"     Has a bittersweet taste in mouth 8. PREGNANCY:  "Is there any chance you are pregnant?" "When was your last menstrual period?"     N/a  Answer Assessment - Initial Assessment Questions 1. ONSET: "When did the muscle aches or body pains start?"      Months and worsening x 1 week 2. LOCATION: "What part of your body is hurting?" (e.g., entire body, arms, legs)      Low back pain 8/10, area between shoulder blades  3. SEVERITY: "How bad is the pain?" (Scale 1-10; or mild, moderate, severe)   - MILD (1-3): doesn't interfere with normal activities    - MODERATE (4-7): interferes with normal activities or awakens from sleep    - SEVERE (8-10):  excruciating pain, unable to do any normal activities      8/10 4. CAUSE: "What do you think is causing the pains?"     unknown 5. FEVER: "Have you been having fever?"     no 6. OTHER SYMPTOMS: "Do you have any other symptoms?" (e.g., chest pain, weakness, rash, cold or flu symptoms, weight loss)     weakness 7. PREGNANCY: "Is there any chance you are pregnant?" "When was your last menstrual period?"     no 8. TRAVEL: "Have you traveled out of the country in the last month?" (e.g., travel history, exposures)     no  Protocols used: Confusion - Delirium-A-AH, Neurologic Deficit-A-AH, Back Pain-A-AH, Muscle Aches and Body Pain-A-AH

## 2024-01-29 ENCOUNTER — Encounter: Payer: Self-pay | Admitting: Family Medicine

## 2024-01-29 ENCOUNTER — Telehealth (INDEPENDENT_AMBULATORY_CARE_PROVIDER_SITE_OTHER): Payer: Self-pay | Admitting: Family Medicine

## 2024-01-29 VITALS — BP 124/79 | HR 87 | Temp 98.1°F | Ht 63.0 in | Wt 185.0 lb

## 2024-01-29 DIAGNOSIS — R7303 Prediabetes: Secondary | ICD-10-CM

## 2024-01-29 DIAGNOSIS — K7581 Nonalcoholic steatohepatitis (NASH): Secondary | ICD-10-CM | POA: Diagnosis not present

## 2024-01-29 DIAGNOSIS — M797 Fibromyalgia: Secondary | ICD-10-CM

## 2024-01-29 DIAGNOSIS — G4733 Obstructive sleep apnea (adult) (pediatric): Secondary | ICD-10-CM

## 2024-01-29 DIAGNOSIS — R0609 Other forms of dyspnea: Secondary | ICD-10-CM | POA: Diagnosis not present

## 2024-01-29 DIAGNOSIS — F339 Major depressive disorder, recurrent, unspecified: Secondary | ICD-10-CM

## 2024-01-29 DIAGNOSIS — I10 Essential (primary) hypertension: Secondary | ICD-10-CM | POA: Diagnosis not present

## 2024-01-29 DIAGNOSIS — E559 Vitamin D deficiency, unspecified: Secondary | ICD-10-CM

## 2024-01-29 DIAGNOSIS — E538 Deficiency of other specified B group vitamins: Secondary | ICD-10-CM | POA: Diagnosis not present

## 2024-01-29 LAB — CBC WITH DIFFERENTIAL/PLATELET
Basophils Absolute: 0.1 10*3/uL (ref 0.0–0.1)
Basophils Relative: 0.9 % (ref 0.0–3.0)
Eosinophils Absolute: 0.3 10*3/uL (ref 0.0–0.7)
Eosinophils Relative: 3.8 % (ref 0.0–5.0)
HCT: 33.7 % — ABNORMAL LOW (ref 36.0–46.0)
Hemoglobin: 10.8 g/dL — ABNORMAL LOW (ref 12.0–15.0)
Lymphocytes Relative: 26.5 % (ref 12.0–46.0)
Lymphs Abs: 2.3 10*3/uL (ref 0.7–4.0)
MCHC: 32.1 g/dL (ref 30.0–36.0)
MCV: 77.1 fl — ABNORMAL LOW (ref 78.0–100.0)
Monocytes Absolute: 0.6 10*3/uL (ref 0.1–1.0)
Monocytes Relative: 7.1 % (ref 3.0–12.0)
Neutro Abs: 5.3 10*3/uL (ref 1.4–7.7)
Neutrophils Relative %: 61.7 % (ref 43.0–77.0)
Platelets: 447 10*3/uL — ABNORMAL HIGH (ref 150.0–400.0)
RBC: 4.36 Mil/uL (ref 3.87–5.11)
RDW: 19.8 % — ABNORMAL HIGH (ref 11.5–15.5)
WBC: 8.6 10*3/uL (ref 4.0–10.5)

## 2024-01-29 LAB — COMPREHENSIVE METABOLIC PANEL WITH GFR
ALT: 26 U/L (ref 0–35)
AST: 18 U/L (ref 0–37)
Albumin: 4.2 g/dL (ref 3.5–5.2)
Alkaline Phosphatase: 82 U/L (ref 39–117)
BUN: 16 mg/dL (ref 6–23)
CO2: 27 meq/L (ref 19–32)
Calcium: 9 mg/dL (ref 8.4–10.5)
Chloride: 106 meq/L (ref 96–112)
Creatinine, Ser: 0.97 mg/dL (ref 0.40–1.20)
GFR: 58.84 mL/min — ABNORMAL LOW (ref >60.00)
Glucose, Bld: 103 mg/dL — ABNORMAL HIGH (ref 70–99)
Potassium: 3.6 meq/L (ref 3.5–5.1)
Sodium: 141 meq/L (ref 135–145)
Total Bilirubin: 0.3 mg/dL (ref 0.2–1.2)
Total Protein: 7.1 g/dL (ref 6.0–8.3)

## 2024-01-29 LAB — TSH: TSH: 2.13 u[IU]/mL (ref 0.35–5.50)

## 2024-01-29 LAB — HEMOGLOBIN A1C: Hgb A1c MFr Bld: 6.6 % — ABNORMAL HIGH (ref 4.6–6.5)

## 2024-01-29 LAB — VITAMIN D 25 HYDROXY (VIT D DEFICIENCY, FRACTURES): VITD: 30.92 ng/mL (ref 30.00–100.00)

## 2024-01-29 LAB — VITAMIN B12: Vitamin B-12: 1124 pg/mL — ABNORMAL HIGH (ref 211–911)

## 2024-01-29 MED ORDER — MONTELUKAST SODIUM 10 MG PO TABS: 10.0000 mg | ORAL_TABLET | Freq: Every day | ORAL | 3 refills | Status: DC

## 2024-01-29 NOTE — Progress Notes (Signed)
 Subjective  CC:  Chief Complaint  Patient presents with   confussion    This has been going on for the past month   Gait Problem    HPI: Margaret White is a 73 y.o. female who presents to the office today to address the problems listed above in the chief complaint. Discussed the use of AI scribe software for clinical note transcription with the patient, who gave verbal consent to proceed.  History of Present Illness   She is a 73 year old female with fibromyalgia who presents with shortness of breath and fatigue.  Over the past month, she has experienced shortness of breath and fatigue, describing a sensation of being winded easily and having poor stamina. She requires rest after minimal exertion, such as walking her dog or doing household chores, and feels 'like jello' and exhausted during these episodes. This represents a decline from her previous activity level, where she could walk her dog without needing to rest.  She had an episode at church where she felt like she was going to pass out while standing and talking. This sensation persisted even after sitting down. No racing heart palpitations, chest pain, nausea, vomiting, or sweating were noted during these episodes, but she does report a feeling of heaviness in the chest without pain.  She has a history of fibromyalgia and experiences significant pain in her upper and lower back, particularly between her shoulder blades. The pain is described as burning and aching, exacerbated by activities such as walking the dog around the house.  She uses a CPAP machine and reports getting eight hours of good sleep. She has recently restarted using her Symbicort  inhaler twice a day for her respiratory symptoms, which she had not been using prior to a week ago.  She mentions significant stress related to her husband's health issues, which she feels may be contributing to her symptoms. Her mood is generally 'pretty up' but fluctuates with her  husband's condition.     I reviewed chart, she has had this complaint over the last several years.  She has been evaluated by pulmonology and cardiology.  Normal stress test in 2023 normal echocardiogram.  Pulmonologist with normal PFTs and follow-up chest CT after a pulmonary nodule was found which had cleared.  Possible allergic/asthma component.  Improved mildly on Advair.  Now on Symbicort .  She has history of hypertension, [place], prediabetes.  She is due for blood work.  Blood pressure has been well-controlled.  She denies symptoms of hyperglycemia.  Assessment  1. DOE (dyspnea on exertion)   2. Essential hypertension   3. Fibromyalgia   4. NASH (nonalcoholic steatohepatitis)   5. Prediabetes   6. OSA (obstructive sleep apnea)   7. Vitamin B12 deficiency   8. Vitamin D  deficiency   9. Major depression, recurrent, chronic (HCC)      Plan  Assessment and Plan    Shortness of breath with history of possible asthma, chronic allergies Exertional dyspnea without chest pain or palpitations. Normal EKG. Possible allergy asthma or stress-related. - Follow up in May to review blood test results and symptom progression. Exertional dyspnea and fatigue possibly due to allergies. Normal EKG. Restarted Symbicort . Considering montelukast . - Continue Symbicort  inhaler twice daily. - Prescribe montelukast  (Singulair ). - Monitor symptoms over the next 2-4 weeks. - Rule out other causes with blood work including hypothyroidism, anemia, liver or kidney problems.  Hypertension is controlled  Recheck A1c given history of prediabetes.  Fibromyalgia Increased back pain, possibly stress-related. Current mood medications  effective. - Encourage stress management and mood monitoring. - Continue current mood medications.  Continue Cymbalta   Depression may be playing a role.  Stress reaction given husband's declining health.  Continue Prozac  and Cymbalta .  Patient to monitor.  She will consider  counseling.  History of vitamin B12 and D deficiencies: Recheck today.  Could be contributing.       Orders Placed This Encounter  Procedures   CBC with Differential/Platelet   Comprehensive metabolic panel with GFR   TSH   Hemoglobin A1c   Vitamin B12   VITAMIN D  25 Hydroxy (Vit-D Deficiency, Fractures)   EKG 12-Lead   Meds ordered this encounter  Medications   montelukast  (SINGULAIR ) 10 MG tablet    Sig: Take 1 tablet (10 mg total) by mouth at bedtime.    Dispense:  30 tablet    Refill:  3     I reviewed the patients updated PMH, FH, and SocHx.    Patient Active Problem List   Diagnosis Date Noted   Prediabetes 02/09/2021    Priority: High   Tubular adenoma of colon 08/19/2019    Priority: High   DOE (dyspnea on exertion) 05/14/2018    Priority: High   Statin intolerance 03/25/2018    Priority: High   GERD (gastroesophageal reflux disease) 10/14/2017    Priority: High   NASH (nonalcoholic steatohepatitis) 10/14/2017    Priority: High   OSA (obstructive sleep apnea) 10/14/2017    Priority: High   Fibromyalgia 10/14/2017    Priority: High   Obesity (BMI 30-39.9) 10/14/2017    Priority: High   Mixed hyperlipidemia 10/14/2017    Priority: High   Essential hypertension 06/26/2017    Priority: High   Osteopenia 05/21/2023    Priority: Medium    Incontinence without sensory awareness 05/08/2018    Priority: Medium    Mixed incontinence 05/08/2018    Priority: Medium    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    Priority: Medium    Bilateral primary osteoarthritis of hip 11/05/2017    Priority: Medium    Spondylosis of lumbar region without myelopathy or radiculopathy 11/04/2017    Priority: Medium    IBS (irritable bowel syndrome) 10/14/2017    Priority: Medium    Major depression, recurrent, chronic (HCC) 06/26/2017    Priority: Medium    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    Priority: Low   Allergic rhinitis 10/14/2017    Priority: Low    History of cluster headache 10/14/2017    Priority: Low   Chronic maxillary sinusitis 10/14/2017    Priority: Low   Vitamin B12 deficiency 10/14/2017    Priority: Low   Vitamin D  deficiency 10/14/2017    Priority: Low   Female cystocele 10/14/2017    Priority: Low   Primary osteoarthritis of both hands 07/10/2017    Priority: Low   Iliotibial band syndrome of left side 12/23/2023   Hypersomnia, periodic 01/02/2023   Hypnapompic hallucinations 01/02/2023   Sleep paralysis, recurrent isolated 01/02/2023   Nightmares 01/02/2023   Episodic cluster headache, not intractable 01/02/2023   Heart murmur, systolic 08/27/2022   Family history of abdominal aortic aneurysm (AAA) 07/03/2022   Periodic limb movement sleep disorder 08/15/2021   Current Meds  Medication Sig   amitriptyline  (ELAVIL ) 25 MG tablet Take 1 tablet (25 mg total) by mouth at bedtime.   amLODipine  (NORVASC ) 10 MG tablet Take 1 tablet (10 mg total) by mouth daily.   budesonide -formoterol  (SYMBICORT ) 160-4.5 MCG/ACT inhaler Inhale 2  puffs into the lungs 2 (two) times daily.   calcium carbonate (OS-CAL) 600 MG TABS tablet Take 600 mg by mouth daily with breakfast.   DULoxetine  (CYMBALTA ) 60 MG capsule TAKE 1 CAPSULE DAILY (REPLACING 30 MG PRESCRIPTION)   esomeprazole  (NEXIUM ) 40 MG capsule TAKE 1 CAPSULE (40 MG TOTAL) BY MOUTH 2 (TWO) TIMES DAILY BEFORE A MEAL.   ezetimibe  (ZETIA ) 10 MG tablet TAKE 1 TABLET DAILY   FLUoxetine  (PROZAC ) 10 MG tablet Take 1 tablet (10 mg total) by mouth daily.   fluticasone  (FLONASE ) 50 MCG/ACT nasal spray USE 1 SPRAY IN EACH NOSTRIL DAILY AS NEEDED FOR ALLERGIES   gabapentin  (NEURONTIN ) 300 MG capsule TAKE 1 CAPSULE THREE TIMES A DAY   levocetirizine (XYZAL) 5 MG tablet Take 5 mg by mouth 2 (two) times daily.   montelukast  (SINGULAIR ) 10 MG tablet Take 1 tablet (10 mg total) by mouth at bedtime.   Polyvinyl Alcohol-Povidone PF (REFRESH) 1.4-0.6 % SOLN Place 1 drop into both eyes daily as needed  (Dry eye).   pravastatin  (PRAVACHOL ) 10 MG tablet Take 1 tablet (10 mg total) by mouth daily.   triamcinolone  cream (KENALOG ) 0.1 %     Allergies: Patient is allergic to codeine  and sulfa antibiotics. Family History: Patient family history includes Alcoholism in her paternal grandfather; CAD in her maternal grandfather; Cancer in her paternal grandmother; Congestive Heart Failure in her mother; Emphysema in her maternal grandmother and sister; Heart disease in her brother, father, maternal grandfather, and maternal grandmother; Hypertension in her mother; Multiple myeloma in her mother; Other in her brother; Prostate cancer in her father. Social History:  Patient  reports that she has never smoked. She has never used smokeless tobacco. She reports that she does not drink alcohol and does not use drugs.  Review of Systems: Constitutional: Negative for fever malaise or anorexia Cardiovascular: negative for chest pain Respiratory: negative for SOB or persistent cough Gastrointestinal: negative for abdominal pain  Objective  Vitals: BP 124/79   Pulse 87   Temp 98.1 F (36.7 C)   Ht 5\' 3"  (1.6 m)   Wt 185 lb (83.9 kg)   SpO2 95%   BMI 32.77 kg/m  General: no acute distress , A&Ox3, appears well Psych: Flat affect HEENT: PEERL, conjunctiva normal, neck is supple Cardiovascular:  RRR without murmur or gallop.  Respiratory:  Good breath sounds bilaterally, CTAB with normal respiratory effort, no wheezing Gastrointestinal: soft, flat abdomen, normal active bowel sounds, no palpable masses, no hepatosplenomegaly, no appreciated hernias Nontender No edema Skin:  Warm, no rashes  Commons side effects, risks, benefits, and alternatives for medications and treatment plan prescribed today were discussed, and the patient expressed understanding of the given instructions. Patient is instructed to call or message via MyChart if he/she has any questions or concerns regarding our treatment plan. No  barriers to understanding were identified. We discussed Red Flag symptoms and signs in detail. Patient expressed understanding regarding what to do in case of urgent or emergency type symptoms.  Medication list was reconciled, printed and provided to the patient in AVS. Patient instructions and summary information was reviewed with the patient as documented in the AVS. This note was prepared with assistance of Dragon voice recognition software. Occasional wrong-word or sound-a-like substitutions may have occurred due to the inherent limitations of voice recognition software

## 2024-01-29 NOTE — Patient Instructions (Signed)
 Please follow up as scheduled for your next visit with me: Visit date not found   I will release your lab results to you on your MyChart account with further instructions. You may see the results before I do, but when I review them I will send you a message with my report or have my assistant call you if things need to be discussed. Please reply to my message with any questions. Thank you!   If you have any questions or concerns, please don't hesitate to send me a message via MyChart or call the office at [phone]. Thank you for visiting with us  today! It's our pleasure caring for you.   VISIT SUMMARY:  You are a 73 year old female with fibromyalgia who came in today because of shortness of breath and fatigue. Over the past month, you have been feeling winded easily and have poor stamina, needing to rest after minimal exertion. You also had an episode at church where you felt like you were going to pass out. You have a history of fibromyalgia and significant back pain, and you have recently restarted using your Symbicort  inhaler for respiratory symptoms. You are also experiencing significant stress related to your husband's health issues.  YOUR PLAN:  -SHORTNESS OF BREATH: Your shortness of breath, especially during exertion, may be related to allergies/asthma and/ or stress. Your EKG was normal, which is a good sign. Continue your symbicort  and we will add singulair . We will follow up in May to review your blood test results and see how your symptoms are progressing.  -ALLERGY ASTHMA: Your exertional dyspnea and fatigue might be due to allergies/asthma. You have restarted using your Symbicort  inhaler twice a day, and we are considering adding montelukast  (Singulair ) to help manage your symptoms. We will monitor your symptoms over the next 2-4 weeks and have ordered blood tests to check your thyroid  function and complete blood count.  -FIBROMYALGIA: Your fibromyalgia is causing increased back  pain, which may be related to stress. Your current mood medications are effective. We encourage you to manage your stress and continue monitoring your mood. Please continue taking your current mood medications.  INSTRUCTIONS:  Please follow up in May to review your blood test results and discuss how your symptoms are progressing. Continue using your Symbicort  inhaler twice daily and start taking montelukast  (Singulair ) as prescribed. Monitor your symptoms over the next 2-4 weeks and manage your stress as best as you can. If you have any new or worsening symptoms, please contact our office.

## 2024-01-30 ENCOUNTER — Encounter: Payer: Self-pay | Admitting: Family Medicine

## 2024-01-30 ENCOUNTER — Ambulatory Visit: Admitting: Family Medicine

## 2024-01-30 NOTE — Progress Notes (Signed)
 See mychart note Dear Margaret White, Your lab results show that you have a new anemia that has developed and it looks like it could be related to low iron  or blood loss.  This could be contributing to your symptoms of shortness of breath.  I recommend starting an otc iron  supplement twice a day and contacting your GI specialist for an appointment as we need to see if you are losing blood from the stomach or colon.  Let me know if you have questions.  Sincerely, Dr. Jonelle Neri

## 2024-02-05 DIAGNOSIS — L219 Seborrheic dermatitis, unspecified: Secondary | ICD-10-CM | POA: Diagnosis not present

## 2024-02-05 DIAGNOSIS — L300 Nummular dermatitis: Secondary | ICD-10-CM | POA: Diagnosis not present

## 2024-02-12 ENCOUNTER — Other Ambulatory Visit: Payer: Self-pay | Admitting: Family Medicine

## 2024-02-12 DIAGNOSIS — E782 Mixed hyperlipidemia: Secondary | ICD-10-CM

## 2024-02-12 DIAGNOSIS — I10 Essential (primary) hypertension: Secondary | ICD-10-CM

## 2024-02-17 ENCOUNTER — Encounter (HOSPITAL_COMMUNITY): Payer: Self-pay

## 2024-02-24 ENCOUNTER — Ambulatory Visit: Payer: Medicare Other

## 2024-02-24 ENCOUNTER — Telehealth: Payer: Self-pay

## 2024-02-24 NOTE — Telephone Encounter (Signed)
 Copied from CRM [id number]. Topic: Appointments - Appointment Cancel/Reschedule >> Feb 24, 2024  8:38 AM Bambi Bonine D wrote: Pt is wanting to reschedule her phone AWV appt for today. Pt stated that her husband is in the hospital and isn't sure when he will get out. I'm unable to reschedule the appt due to the arrived status. Pt would like to have it reschedule and get a call back with the available times and days for the reschedule.  Please contact pt to see how we may accommodate her   Thank you,  [place]

## 2024-02-24 NOTE — Telephone Encounter (Signed)
 Called pt but unable to r/s at this time. Will call back to r/s.

## 2024-02-27 ENCOUNTER — Ambulatory Visit (INDEPENDENT_AMBULATORY_CARE_PROVIDER_SITE_OTHER)

## 2024-02-27 VITALS — Ht 63.5 in | Wt 185.0 lb

## 2024-02-27 DIAGNOSIS — Z0000 Encounter for general adult medical examination without abnormal findings: Secondary | ICD-10-CM

## 2024-02-27 NOTE — Progress Notes (Signed)
 Subjective:   Margaret White is a 73 y.o. who presents for a Medicare Wellness preventive visit.  As a reminder, Annual Wellness Visits don't include a physical exam, and some assessments may be limited, especially if this visit is performed virtually. We may recommend an in-person follow-up visit with your provider if needed.  Visit Complete: Virtual I connected with  Iver Marker on 02/27/24 by a audio enabled telemedicine application and verified that I am speaking with the correct person using two identifiers.  Patient Location: Home  Provider Location: Office/Clinic  I discussed the limitations of evaluation and management by telemedicine. The patient expressed understanding and agreed to proceed.  Vital Signs: Because this visit was a virtual/telehealth visit, some criteria may be missing or patient reported. Any vitals not documented were not able to be obtained and vitals that have been documented are patient reported.  VideoDeclined- This patient declined Librarian, academic. Therefore the visit was completed with audio only.  Persons Participating in Visit: Patient.  AWV Questionnaire: No: Patient Medicare AWV questionnaire was not completed prior to this visit.  Cardiac Risk Factors include: advanced age (>68men, >56 women);dyslipidemia;hypertension;obesity (BMI >30kg/m2)     Objective:     Today's Vitals   02/27/24 1006  Weight: 185 lb (83.9 kg)  Height: 5' 3.5" (1.613 m)   Body mass index is 32.26 kg/m.     02/27/2024   10:11 AM 02/18/2023   12:07 PM 04/09/2022   12:48 PM 02/16/2022    8:48 AM 02/09/2021    8:40 AM 10/27/2020    9:25 AM 09/26/2020    3:45 PM  Advanced Directives  Does Patient Have a Medical Advance Directive? Yes Yes No Yes No No No  Type of Estate agent of [place];Living will Healthcare Power of [place];Living will  Healthcare Power of Attorney     Copy of Healthcare Power of Attorney in  Chart? No - copy requested No - copy requested  No - copy requested     Would patient like information on creating a medical advance directive?   No - Patient declined  No - Patient declined No - Patient declined No - Patient declined    Current Medications (verified) Outpatient Encounter Medications as of 02/27/2024  Medication Sig   amitriptyline  (ELAVIL ) 25 MG tablet Take 1 tablet (25 mg total) by mouth at bedtime.   amLODipine  (NORVASC ) 10 MG tablet Take 1 tablet (10 mg total) by mouth daily.   budesonide -formoterol  (SYMBICORT ) 160-4.5 MCG/ACT inhaler Inhale 2 puffs into the lungs 2 (two) times daily.   calcium carbonate (OS-CAL) 600 MG TABS tablet Take 600 mg by mouth daily with breakfast.   DULoxetine  (CYMBALTA ) 60 MG capsule TAKE 1 CAPSULE DAILY (REPLACING 30 MG PRESCRIPTION)   esomeprazole  (NEXIUM ) 40 MG capsule TAKE 1 CAPSULE (40 MG TOTAL) BY MOUTH 2 (TWO) TIMES DAILY BEFORE A MEAL.   ezetimibe  (ZETIA ) 10 MG tablet TAKE 1 TABLET DAILY   FLUoxetine  (PROZAC ) 10 MG tablet Take 1 tablet (10 mg total) by mouth daily.   fluticasone  (FLONASE ) 50 MCG/ACT nasal spray USE 1 SPRAY IN EACH NOSTRIL DAILY AS NEEDED FOR ALLERGIES   gabapentin  (NEURONTIN ) 300 MG capsule TAKE 1 CAPSULE THREE TIMES A DAY   levocetirizine (XYZAL) 5 MG tablet Take 5 mg by mouth 2 (two) times daily.   montelukast  (SINGULAIR ) 10 MG tablet Take 1 tablet (10 mg total) by mouth at bedtime.   Polyvinyl Alcohol-Povidone PF (REFRESH) 1.4-0.6 % SOLN Place 1 drop  into both eyes daily as needed (Dry eye).   pravastatin  (PRAVACHOL ) 10 MG tablet Take 1 tablet (10 mg total) by mouth daily.   triamcinolone  cream (KENALOG ) 0.1 %    [DISCONTINUED] amLODipine  (NORVASC ) 5 MG tablet TAKE 1 TABLET DAILY   No facility-administered encounter medications on file as of 02/27/2024.    Allergies (verified) Codeine  and Sulfa antibiotics   History: Past Medical History:  Diagnosis Date   Allergic rhinitis    Allergy    Anxiety    Asthma     Avascular necrosis of hip, right (HCC) 03/24/2018   Cataract    Chronic cluster headache    Chronic sinusitis    Diabetes mellitus type 2, diet-controlled (HCC)    per pt currently no taking metformin , followed by pcp   Eczema    Essential hypertension    cardiologsit-- dr dr Sandee Crook   Family history of abdominal aortic aneurysm (AAA) 07/03/2022   brother   Fatty liver    Fibromyalgia    GERD (gastroesophageal reflux disease)    Hepatic steatosis 10/14/2017   Hiatal hernia    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 Shallow disc bulging without significant neuroforaminal stenosis.   SUI (stress  urinary incontinence, female)    Vitamin B 12 deficiency    Vitamin D  deficiency    Past Surgical History:  Procedure Laterality Date   ABDOMINOPLASTY  2006   ANAL FISSURE REPAIR  2006   BIOPSY  10/27/2020   Procedure: BIOPSY;  Surgeon: Brice Campi Albino Alu., MD;  Location: [facility] ENDOSCOPY;  Service: Gastroenterology;;   BLADDER SURGERY  1984   BREAST BIOPSY Right     benign     CARPAL TUNNEL RELEASE Bilateral 1989   CATARACT EXTRACTION W/ INTRAOCULAR LENS  IMPLANT, BILATERAL  2017   COLONOSCOPY     COLONOSCOPY W/ POLYPECTOMY  2017   ECTOPIC PREGNANCY SURGERY  1979   ESOPHAGOGASTRODUODENOSCOPY (EGD) WITH PROPOFOL  N/A 10/27/2020   Procedure: ESOPHAGOGASTRODUODENOSCOPY (  EGD) WITH PROPOFOL ;  Surgeon: Mansouraty, Albino Alu., MD;  Location[facility] ENDOSCOPY;  Service: Gastroenterology;  Laterality: N/A;   EYE SURGERY  child    x4  1957-1960   INCONTINENCE SURGERY     NASAL SEPTUM SURGERY  1987   PUBOVAGINAL SLING N/A 03/24/2019   Procedure: CYSTOSCOPY  Gino Lais;  Surgeon: Erman Hayward, MD;  Location: [facility];  Service: Urology;  Laterality: N/A;   REVISION TOTAL HIP ARTHROPLASTY Right    RHINOPLASTY  2007   TOTAL ABDOMINAL HYSTERECTOMY W/ BILATERAL SALPINGOOPHORECTOMY  1984   w/ Marshall-Marchetti (bladder tack suspension)   TOTAL HIP ARTHROPLASTY Right 04/11/2018   Procedure: RIGHT TOTAL HIP ARTHROPLASTY ANTERIOR APPROACH;  Surgeon: Arnie Lao, MD;  Location: [facility] ORS;  Service: Orthopedics;  Laterality: Right;   ULNAR NERVE TRANSPOSITION Left 1990s   UPPER ESOPHAGEAL ENDOSCOPIC ULTRASOUND (EUS) N/A 10/27/2020   Procedure: UPPER ESOPHAGEAL ENDOSCOPIC ULTRASOUND (EUS);  Surgeon: Normie Becton., MD;  Location: [facility] ENDOSCOPY;  Service: Gastroenterology;  Laterality: N/A;   Family History  Problem Relation Age of Onset   Hypertension Mother    Congestive Heart Failure Mother    Multiple myeloma Mother    Heart disease Father     Prostate cancer Father    Emphysema Sister    Heart disease Brother    Other Brother        brain tumor   Heart disease Maternal Grandmother    Emphysema Maternal Grandmother    CAD Maternal Grandfather    Heart disease Maternal Grandfather    Cancer Paternal Grandmother        type unknown, mets   Alcoholism Paternal Grandfather    Colon cancer Neg Hx    Esophageal cancer Neg Hx    Stomach cancer Neg Hx    Rectal cancer Neg Hx    Social History   Socioeconomic History   Marital status: Married    Spouse name: Not on file   Number of children: 4   Years of education: Not on file   Highest education level: Not on file  Occupational History   Occupation: TEACHER    Employer: [employer]    Comment: retired  Tobacco Use   Smoking status: Never   Smokeless tobacco: Never  Vaping Use   Vaping status: Never Used  Substance and Sexual Activity   Alcohol use: No   Drug use: Never   Sexual activity: Yes    Birth control/protection: Surgical  Other Topics Concern   Not on file  Social History Narrative   Not on file   Social Drivers of Health   Financial Resource Strain: Low Risk  (02/27/2024)   Overall Financial Resource Strain (CARDIA)    Difficulty of Paying Living Expenses: Not hard at all  Food Insecurity: No Food Insecurity (02/27/2024)   Hunger Vital Sign    Worried About Running Out of Food in the Last Year: Never true    Ran Out of Food in the Last Year: Never true  Transportation Needs: No Transportation Needs (02/27/2024)   PRAPARE - Administrator, Civil Service (Medical): No    Lack of Transportation (Non-Medical): No  Physical Activity: Inactive (02/27/2024)   Exercise Vital Sign    Days of Exercise per Week: 0 days    Minutes of Exercise per Session: 0 min  Stress: Stress Concern Present (02/27/2024)   [employer] of Occupational Health - Occupational Stress Questionnaire    Feeling of Stress :  To some extent  Social  Connections: Moderately Integrated (02/27/2024)   Social Connection and Isolation Panel [NHANES]    Frequency of Communication with Friends and Family: More than three times a week    Frequency of Social Gatherings with Friends and Family: More than three times a week    Attends Religious Services: More than 4 times per year    Active Member of [employer] or Organizations: No    Attends Engineer, structural: Never    Marital Status: Married    Tobacco Counseling Counseling given: Not Answered    Clinical Intake:  Pre-visit preparation completed: Yes  Pain : No/denies pain     BMI - recorded: 32.26 Nutritional Status: BMI > 30  Obese Nutritional Risks: None Diabetes: No  Lab Results  Component Value Date   HGBA1C 6.6 (H) 01/29/2024   HGBA1C 6.2 03/05/2023   HGBA1C 6.0 (A) 08/28/2022     How often do you need to have someone help you when you read instructions, pamphlets, or other written materials from your doctor or pharmacy?: 1 - Never  Interpreter Needed?: No  Information entered by :: Lamont Pilsner, LPN   Activities of Daily Living     02/27/2024   10:08 AM  In your present state of health, do you have any difficulty performing the following activities:  Hearing? 0  Vision? 0  Difficulty concentrating or making decisions? 0  Walking or climbing stairs? 0  Dressing or bathing? 0  Doing errands, shopping? 0  Preparing Food and eating ? N  Using the Toilet? N  In the past six months, have you accidently leaked urine? N  Do you have problems with loss of bowel control? N  Managing your Medications? N  Managing your Finances? N  Housekeeping or managing your Housekeeping? N    Patient Care Team: Luevenia Saha, MD as PCP - General (Family Medicine) Jann Melody, MD as PCP - Cardiology (Cardiology) Devon Fogo, MD (Inactive) as Consulting Physician (Dermatology) Erman Hayward, MD as Consulting Physician (Urology) Lindle Rhea,  MD (Inactive) as Consulting Physician (Gastroenterology) Murtis Arthur, OD as Consulting Physician (Optometry) Dohmeier, Raoul Byes, MD as Consulting Physician (Neurology) Prudy Brownie, DO (Inactive) as Consulting Physician (Pulmonary Disease)  Indicate any recent Medical Services you may have received from other than [facility] providers in the past year (date may be approximate).     Assessment:    This is a routine wellness examination for [place].  Hearing/Vision screen Hearing Screening - Comments:: Pt denies any hearing issues  Vision Screening - Comments:: Wears rx glasses - up to date with routine eye exams with Dr Garland Junk     Goals Addressed             This Visit's Progress    Patient Stated       Get back to the gym to exercise        Depression Screen     02/27/2024   10:10 AM 01/29/2024    1:10 PM 01/29/2024    1:06 PM 07/08/2023    2:08 PM 03/05/2023   10:19 AM 02/18/2023   12:05 PM 10/19/2022    9:35 AM  PHQ 2/9 Scores  PHQ - 2 Score 0 0 0 0 0 0 0  PHQ- 9 Score    0       Fall Risk     02/27/2024   10:12 AM 01/29/2024    1:06 PM 07/08/2023    2:07 PM 03/05/2023  10:19 AM 02/18/2023   12:07 PM  Fall Risk   Falls in the past year? 0 0 0 0 0  Number falls in past yr: 0 0 0 0 0  Injury with Fall? 0 0 0 0 0  Risk for fall due to : Impaired balance/gait No Fall Risks No Fall Risks No Fall Risks Impaired vision  Follow up  Falls evaluation completed Falls evaluation completed Falls evaluation completed Falls prevention discussed    MEDICARE RISK AT HOME:  Medicare Risk at Home Any stairs in or around the home?: Yes If so, are there any without handrails?: No Home free of loose throw rugs in walkways, pet beds, electrical cords, etc?: Yes Adequate lighting in your home to reduce risk of falls?: Yes Life alert?: No Use of a cane, walker or w/c?: No Grab bars in the bathroom?: Yes Shower chair or bench in shower?: Yes Elevated toilet seat or a handicapped  toilet?: No  TIMED UP AND GO:  Was the test performed?  No  Cognitive Function: 6CIT completed        02/27/2024   10:12 AM 02/18/2023   12:08 PM 02/16/2022    8:52 AM 02/09/2021    8:43 AM 01/05/2020    1:03 PM  6CIT Screen  What Year? 0 points 0 points 0 points 0 points 0 points  What month? 0 points 0 points 0 points 0 points 0 points  What time? 0 points 0 points 0 points  0 points  Count back from 20 0 points 0 points 0 points 0 points 0 points  Months in reverse 0 points 0 points 0 points 0 points 0 points  Repeat phrase 0 points 0 points 2 points 0 points 0 points  Total Score 0 points 0 points 2 points  0 points    Immunizations Immunization History  Administered Date(s) Administered   Fluad Quad(high Dose 65+) 06/24/2020   Influenza,inj,Quad PF,6+ Mos 08/06/2017, 07/31/2018   Influenza-Unspecified 07/23/2019, 08/07/2021   Pneumococcal Conjugate-13 03/24/2018   Pneumococcal Polysaccharide-23 08/19/2019   Tdap 04/25/2018   Zoster Recombinant(Shingrix) 08/07/2021, 08/30/2021    Screening Tests Health Maintenance  Topic Date Due   INFLUENZA VACCINE  05/08/2024   MAMMOGRAM  09/08/2024   Medicare Annual Wellness (AWV)  02/26/2025   DEXA SCAN  03/19/2025   Colonoscopy  08/14/2026   DTaP/Tdap/Td (2 - Td or Tdap) 04/25/2028   Pneumonia Vaccine 57+ Years old  Completed   Hepatitis C Screening  Completed   HPV VACCINES  Aged Out   Meningococcal B Vaccine  Aged Out   COVID-19 Vaccine  Discontinued   Zoster Vaccines- Shingrix  Discontinued    Health Maintenance  There are no preventive care reminders to display for this patient.  Health Maintenance Items Addressed: See Nurse Notes  Additional Screening:  Vision Screening: Recommended annual ophthalmology exams for early detection of glaucoma and other disorders of the eye.  Dental Screening: Recommended annual dental exams for proper oral hygiene  Community Resource Referral / Chronic Care Management: CRR  required this visit?  No   CCM required this visit?  No   Plan:    I have personally reviewed and noted the following in the patient's chart:   Medical and social history Use of alcohol, tobacco or illicit drugs  Current medications and supplements including opioid prescriptions. Patient is not currently taking opioid prescriptions. Functional ability and status Nutritional status Physical activity Advanced directives List of other physicians Hospitalizations, surgeries, and ER visits in  previous 12 months Vitals Screenings to include cognitive, depression, and falls Referrals and appointments  In addition, I have reviewed and discussed with patient certain preventive protocols, quality metrics, and best practice recommendations. A written personalized care plan for preventive services as well as general preventive health recommendations were provided to patient.   Bruno Capri, LPN   [id number]   After Visit Summary: (MyChart) Due to this being a telephonic visit, the after visit summary with patients personalized plan was offered to patient via MyChart   Notes: Nothing significant to report at this time.

## 2024-03-02 DIAGNOSIS — L509 Urticaria, unspecified: Secondary | ICD-10-CM | POA: Diagnosis not present

## 2024-03-02 DIAGNOSIS — L299 Pruritus, unspecified: Secondary | ICD-10-CM | POA: Diagnosis not present

## 2024-03-12 ENCOUNTER — Ambulatory Visit: Admitting: Family Medicine

## 2024-03-16 ENCOUNTER — Encounter: Payer: Self-pay | Admitting: Family Medicine

## 2024-03-16 ENCOUNTER — Ambulatory Visit (INDEPENDENT_AMBULATORY_CARE_PROVIDER_SITE_OTHER): Admitting: Family Medicine

## 2024-03-16 VITALS — BP 123/87 | HR 93 | Temp 98.1°F | Ht 63.5 in | Wt 186.0 lb

## 2024-03-16 DIAGNOSIS — L280 Lichen simplex chronicus: Secondary | ICD-10-CM

## 2024-03-16 DIAGNOSIS — F4322 Adjustment disorder with anxiety: Secondary | ICD-10-CM

## 2024-03-16 MED ORDER — HYDROXYZINE PAMOATE 25 MG PO CAPS: 25.0000 mg | ORAL_CAPSULE | Freq: Three times a day (TID) | ORAL | 1 refills | Status: DC | PRN

## 2024-03-16 NOTE — Progress Notes (Signed)
 Subjective  CC:  Chief Complaint  Patient presents with   Rash    Opt state that she thinks that there is a rash in her head bc it has been itching a lot and stomach and back    HPI: Margaret White is a 73 y.o. female who presents to the office today to address the problems listed above in the chief complaint. Discussed the use of AI scribe software for clinical note transcription with the patient, who gave verbal consent to proceed.  History of Present Illness Margaret White is a 73 year old female with nummular eczema and seborrheic dermatitis who presents with persistent itching and rash.  She has been experiencing significant itching and a rash resembling hives, primarily on her head, abdomen, back, and ears. The itching is accompanied by bumps and swelling on her head, described as a burning sensation that leads to small pimples if scratched.  The symptoms began a few months ago, initially affecting her leg, which was diagnosed as contact dermatitis. Despite treatment with medications, including a steroid injection, the symptoms persist and are described as 'really miserable' when severe. She has been taking Benadryl  for relief, noting that taking six tablets provides only one to two hours of relief, while four tablets are ineffective. No prior history of similar symptoms.  I reviewed brief records.  She reports she was seen twice by her dermatologist in [place].  She was diagnosed with nummular eczema and seborrheic dermatitis.  Treated with topical steroid cream and a triamcinolone  injection.  Neither were beneficial.  She no longer has a rash.  The rash is not always visible, but she can feel bumps throughout her head, which are not closely grouped. Previously, she had a severe rash on her shin that was blistering and seeping, but this has since resolved.  Her social history reveals significant stress related to her husband's health issues, including his recent hospitalization  and ongoing need for care, which she finds overwhelming. She is currently on medication for depression and reports feeling fine when not around her husband.  She continues on her allergy medicines including Xyzal and Singulair .   Assessment  1. Neurodermatitis   2. Adjustment reaction with anxiety      Plan  Assessment and Plan Assessment & Plan Adjustment Anxiety Significant stress and anxiety due to husband's hospitalization and cancer diagnosis, affecting her neurodermatitis. Current depression medication advised for anxiety management. - Continue current medication for depression.  Neurodermatitis Chronic itching with burning and urticarial eruptions, exacerbated by stress. Initially diagnosed as contact dermatitis, later as nummular eczema and seborrheic dermatitis. Suspected neurodermatitis due to adjustment anxiety. - Prescribe hydroxyzine  25 to 50 mg TID as needed for itching relief. - Education and prognosis discussed.  To work on behavioral strategies: Stop itching, try tapping, moisturizers and things to help with anxiety including meditation and deep breathing exercises. - Continue Cymbalta  - Let me know if things are not improving.  Could try short course of Xanax.    Follow up: As needed, due for complete physical she was No orders of the defined types were placed in this encounter.  Meds ordered this encounter  Medications   hydrOXYzine  (VISTARIL ) 25 MG capsule    Sig: Take 1-2 capsules (25-50 mg total) by mouth every 8 (eight) hours as needed for itching or anxiety.    Dispense:  90 capsule    Refill:  1     I reviewed the patients updated PMH, FH, and SocHx.  Patient Active  Problem List   Diagnosis Date Noted   Prediabetes 02/09/2021    Priority: High   Tubular adenoma of colon 08/19/2019    Priority: High   DOE (dyspnea on exertion) 05/14/2018    Priority: High   Statin intolerance 03/25/2018    Priority: High   GERD (gastroesophageal reflux disease)  10/14/2017    Priority: High   NASH (nonalcoholic steatohepatitis) 10/14/2017    Priority: High   OSA (obstructive sleep apnea) 10/14/2017    Priority: High   Fibromyalgia 10/14/2017    Priority: High   Obesity (BMI 30-39.9) 10/14/2017    Priority: High   Mixed hyperlipidemia 10/14/2017    Priority: High   Essential hypertension 06/26/2017    Priority: High   Osteopenia 05/21/2023    Priority: Medium    Incontinence without sensory awareness 05/08/2018    Priority: Medium    Mixed incontinence 05/08/2018    Priority: Medium    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    Priority: Medium    Bilateral primary osteoarthritis of hip 11/05/2017    Priority: Medium    Spondylosis of lumbar region without myelopathy or radiculopathy 11/04/2017    Priority: Medium    IBS (irritable bowel syndrome) 10/14/2017    Priority: Medium    Major depression, recurrent, chronic (HCC) 06/26/2017    Priority: Medium    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    Priority: Low   Allergic rhinitis 10/14/2017    Priority: Low   History of cluster headache 10/14/2017    Priority: Low   Chronic maxillary sinusitis 10/14/2017    Priority: Low   Vitamin B12 deficiency 10/14/2017    Priority: Low   Vitamin D  deficiency 10/14/2017    Priority: Low   Female cystocele 10/14/2017    Priority: Low   Primary osteoarthritis of both hands 07/10/2017    Priority: Low   Iliotibial band syndrome of left side 12/23/2023   Hypersomnia, periodic 01/02/2023   Hypnapompic hallucinations 01/02/2023   Sleep paralysis, recurrent isolated 01/02/2023   Nightmares 01/02/2023   Episodic cluster headache, not intractable 01/02/2023   Heart murmur, systolic 08/27/2022   Family history of abdominal aortic aneurysm (AAA) 07/03/2022   Periodic limb movement sleep disorder 08/15/2021   Current Meds  Medication Sig   amitriptyline  (ELAVIL ) 25 MG tablet Take 1 tablet (25 mg total) by mouth at bedtime.   amLODipine   (NORVASC ) 10 MG tablet Take 1 tablet (10 mg total) by mouth daily.   budesonide -formoterol  (SYMBICORT ) 160-4.5 MCG/ACT inhaler Inhale 2 puffs into the lungs 2 (two) times daily.   calcium carbonate (OS-CAL) 600 MG TABS tablet Take 600 mg by mouth daily with breakfast.   DULoxetine  (CYMBALTA ) 60 MG capsule TAKE 1 CAPSULE DAILY (REPLACING 30 MG PRESCRIPTION)   esomeprazole  (NEXIUM ) 40 MG capsule TAKE 1 CAPSULE (40 MG TOTAL) BY MOUTH 2 (TWO) TIMES DAILY BEFORE A MEAL.   ezetimibe  (ZETIA ) 10 MG tablet TAKE 1 TABLET DAILY   FLUoxetine  (PROZAC ) 10 MG tablet Take 1 tablet (10 mg total) by mouth daily.   fluticasone  (FLONASE ) 50 MCG/ACT nasal spray USE 1 SPRAY IN EACH NOSTRIL DAILY AS NEEDED FOR ALLERGIES   gabapentin  (NEURONTIN ) 300 MG capsule TAKE 1 CAPSULE THREE TIMES A DAY   hydrOXYzine  (VISTARIL ) 25 MG capsule Take 1-2 capsules (25-50 mg total) by mouth every 8 (eight) hours as needed for itching or anxiety.   levocetirizine (XYZAL) 5 MG tablet Take 5 mg by mouth 2 (two) times daily.   montelukast  (  SINGULAIR ) 10 MG tablet Take 1 tablet (10 mg total) by mouth at bedtime.   Polyvinyl Alcohol-Povidone PF (REFRESH) 1.4-0.6 % SOLN Place 1 drop into both eyes daily as needed (Dry eye).   pravastatin  (PRAVACHOL ) 10 MG tablet Take 1 tablet (10 mg total) by mouth daily.   triamcinolone  cream (KENALOG ) 0.1 %    Allergies: Patient is allergic to codeine  and sulfa antibiotics. Family History: Patient family history includes Alcoholism in her paternal grandfather; CAD in her maternal grandfather; Cancer in her paternal grandmother; Congestive Heart Failure in her mother; Emphysema in her maternal grandmother and sister; Heart disease in her brother, father, maternal grandfather, and maternal grandmother; Hypertension in her mother; Multiple myeloma in her mother; Other in her brother; Prostate cancer in her father. Social History:  Patient  reports that she has never smoked. She has never used smokeless  tobacco. She reports that she does not drink alcohol and does not use drugs.  Review of Systems: Constitutional: Negative for fever malaise or anorexia Cardiovascular: negative for chest pain Respiratory: negative for SOB or persistent cough Gastrointestinal: negative for abdominal pain  Objective  Vitals: BP 123/87   Pulse 93   Temp 98.1 F (36.7 C)   Ht 5' 3.5" (1.613 m)   Wt 186 lb (84.4 kg)   SpO2 92%   BMI 32.43 kg/m  General: no acute distress , A&Ox3, appears anxious, fidgeting, scratching diffusely intermittently. HEENT: PEERL, conjunctiva normal, neck is supple, few papules posterior scalp, no rash, no flaking, TMs normal bilaterally Skin:  Warm, no rashes Commons side effects, risks, benefits, and alternatives for medications and treatment plan prescribed today were discussed, and the patient expressed understanding of the given instructions. Patient is instructed to call or message via MyChart if he/she has any questions or concerns regarding our treatment plan. No barriers to understanding were identified. We discussed Red Flag symptoms and signs in detail. Patient expressed understanding regarding what to do in case of urgent or emergency type symptoms.  Medication list was reconciled, printed and provided to the patient in AVS. Patient instructions and summary information was reviewed with the patient as documented in the AVS. This note was prepared with assistance of Dragon voice recognition software. Occasional wrong-word or sound-a-like substitutions may have occurred due to the inherent limitations of voice recognition software

## 2024-03-24 ENCOUNTER — Encounter: Payer: Self-pay | Admitting: Family Medicine

## 2024-03-27 DIAGNOSIS — L501 Idiopathic urticaria: Secondary | ICD-10-CM | POA: Diagnosis not present

## 2024-03-27 DIAGNOSIS — T7840XA Allergy, unspecified, initial encounter: Secondary | ICD-10-CM | POA: Diagnosis not present

## 2024-03-27 DIAGNOSIS — D485 Neoplasm of uncertain behavior of skin: Secondary | ICD-10-CM | POA: Diagnosis not present

## 2024-04-09 ENCOUNTER — Ambulatory Visit (HOSPITAL_BASED_OUTPATIENT_CLINIC_OR_DEPARTMENT_OTHER): Admission: EM | Admit: 2024-04-09 | Discharge: 2024-04-09 | Disposition: A | Discharge status: Home / Self Care

## 2024-04-09 ENCOUNTER — Other Ambulatory Visit: Payer: Self-pay | Admitting: Family Medicine

## 2024-04-09 ENCOUNTER — Other Ambulatory Visit: Payer: Self-pay | Admitting: Cardiology

## 2024-04-09 ENCOUNTER — Encounter (HOSPITAL_BASED_OUTPATIENT_CLINIC_OR_DEPARTMENT_OTHER): Payer: Self-pay | Admitting: Emergency Medicine

## 2024-04-09 DIAGNOSIS — R197 Diarrhea, unspecified: Secondary | ICD-10-CM

## 2024-04-09 DIAGNOSIS — R0609 Other forms of dyspnea: Secondary | ICD-10-CM

## 2024-04-09 NOTE — Discharge Instructions (Signed)
 I believe that the diarrhea could be stress related. Doubtful that you area contagious. Could also be something you ingested.  Try the Zofran  that you have at home to see if this helps with the diarrhea and nausea.  Follow up with your doctor next week if symptoms persist.

## 2024-04-09 NOTE — ED Triage Notes (Signed)
 Chills on Monday-Wed. Diarrhea since Monday.  Mouth taste metallic or bitter sweet that go back and forth for over week. Doctor told her when first started that medication related.  Had biopsy over week ago due to having hives on torso and was told it was something in her environment.   Reports cares for her husband who had a stroke last year. Was recently in hospital in critical care.

## 2024-04-11 NOTE — ED Provider Notes (Signed)
 [facility]    CSN: [id number] Arrival date & time: 04/09/24  1136      History   Chief Complaint Chief Complaint  Patient presents with   Diarrhea    HPI Margaret White is a 73 y.o. female.   Pt is a 73 year old female that presents with diarrhea since Monday. 4-5 episodes daily. No vomiting or abdominal pain.  Mouth taste metallic or bitter sweet that go back and forth for over week. Doctor told her when first started that medication related.  Had biopsy over week ago due to having hives on torso and was told it was something in her environment. She is hydroxyzine  for this.  Reports cares for her husband who had a stroke last year. Was recently in hospital in critical care. She is under a lot of stress. Hx of IBS.     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   Cataract    Chronic cluster headache    Chronic sinusitis    Diabetes mellitus type 2, diet-controlled (HCC)    per pt currently no taking metformin , followed by pcp   Eczema    Essential hypertension    cardiologsit-- dr dr monetta   Family history of abdominal aortic aneurysm (AAA) 07/03/2022   brother   Fatty liver    Fibromyalgia    GERD (gastroesophageal reflux disease)    Hepatic steatosis 10/14/2017   Hiatal hernia    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 Shallow disc bulging without significant neuroforaminal stenosis.   SUI (stress urinary incontinence, female)    Vitamin B 12 deficiency    Vitamin D  deficiency     Patient Active Problem List   Diagnosis Date Noted   Iliotibial band syndrome of left side 12/23/2023   Osteopenia 05/21/2023  Hypersomnia, periodic 01/02/2023   Hypnapompic hallucinations 01/02/2023   Sleep paralysis, recurrent isolated 01/02/2023   Nightmares 01/02/2023   Episodic cluster headache, not intractable 01/02/2023   Heart murmur, systolic 08/27/2022   Family history of abdominal aortic aneurysm (AAA) 07/03/2022   Periodic limb movement sleep disorder 08/15/2021   Prediabetes 02/09/2021   Tubular adenoma of colon 08/19/2019   DOE (dyspnea on exertion) 05/14/2018   Incontinence without sensory awareness 05/08/2018   Mixed incontinence 05/08/2018   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   Statin intolerance 03/25/2018   Bilateral primary osteoarthritis of hip 11/05/2017   Spondylosis of lumbar region without myelopathy or radiculopathy 11/04/2017   GERD (gastroesophageal reflux disease)  10/14/2017   Allergic rhinitis 10/14/2017   NASH (nonalcoholic steatohepatitis) 10/14/2017   OSA (obstructive sleep apnea) 10/14/2017   History of cluster headache 10/14/2017   IBS (irritable bowel syndrome) 10/14/2017   Fibromyalgia 10/14/2017   Chronic maxillary sinusitis 10/14/2017   Obesity (BMI 30-39.9) 10/14/2017   Mixed hyperlipidemia 10/14/2017   Vitamin B12 deficiency 10/14/2017   Vitamin D  deficiency 10/14/2017   Female cystocele 10/14/2017   Primary osteoarthritis of both hands 07/10/2017   Essential hypertension 06/26/2017   Major depression, recurrent, chronic (HCC) 06/26/2017    Past Surgical History:  Procedure Laterality Date   ABDOMINOPLASTY  2006   ANAL FISSURE REPAIR  2006   BIOPSY  10/27/2020   Procedure: BIOPSY;  Surgeon: Wilhelmenia Aloha Raddle., MD;  Location: [facility] ENDOSCOPY;  Service: Gastroenterology;;   BLADDER SURGERY  1984   BREAST BIOPSY Right     benign     CARPAL TUNNEL RELEASE Bilateral 1989   CATARACT EXTRACTION W/ INTRAOCULAR LENS  IMPLANT, BILATERAL  2017   COLONOSCOPY     COLONOSCOPY W/ POLYPECTOMY  2017   ECTOPIC PREGNANCY SURGERY  1979   ESOPHAGOGASTRODUODENOSCOPY (EGD) WITH PROPOFOL  N/A 10/27/2020   Procedure: ESOPHAGOGASTRODUODENOSCOPY (EGD) WITH PROPOFOL ;  Surgeon: Wilhelmenia Aloha Raddle., MD;  Location: [facility] ENDOSCOPY;  Service: Gastroenterology;  Laterality: N/A;   EYE SURGERY  child    x4  1957-1960   INCONTINENCE SURGERY     NASAL SEPTUM SURGERY  1987   PUBOVAGINAL SLING N/A 03/24/2019   Procedure: CYSTOSCOPY  CARLOYN GLADE;  Surgeon: Gaston Hamilton, MD;  Location: [facility];  Service: Urology;  Laterality: N/A;   REVISION TOTAL HIP ARTHROPLASTY Right    RHINOPLASTY  2007   TOTAL ABDOMINAL HYSTERECTOMY W/ BILATERAL SALPINGOOPHORECTOMY  1984   w/ Marshall-Marchetti (bladder tack suspension)   TOTAL HIP ARTHROPLASTY Right 04/11/2018   Procedure: RIGHT TOTAL HIP ARTHROPLASTY ANTERIOR APPROACH;  Surgeon:  Vernetta Lonni GRADE, MD;  Location: [facility] ORS;  Service: Orthopedics;  Laterality: Right;   ULNAR NERVE TRANSPOSITION Left 1990s   UPPER ESOPHAGEAL ENDOSCOPIC ULTRASOUND (EUS) N/A 10/27/2020   Procedure: UPPER ESOPHAGEAL ENDOSCOPIC ULTRASOUND (EUS);  Surgeon: Wilhelmenia Aloha Raddle., MD;  Location: [facility] ENDOSCOPY;  Service: Gastroenterology;  Laterality: N/A;    OB History   No obstetric history on file.      Home Medications    Prior to Admission medications   Medication Sig Start Date End Date Taking? Authorizing Provider  amitriptyline  (ELAVIL ) 25 MG tablet Take 1 tablet (25 mg total) by mouth at bedtime. 07/18/23   Dohmeier, Dedra, MD  amLODipine  (NORVASC ) 10 MG tablet Take 1 tablet (10 mg total) by mouth daily. 07/08/23   Jodie Lavern CROME, MD  budesonide -formoterol  (SYMBICORT ) 160-4.5 MCG/ACT inhaler Inhale 2 puffs into the  lungs 2 (two) times daily. 06/11/23   Icard, Adine CROME, DO  calcium carbonate (OS-CAL) 600 MG TABS tablet Take 600 mg by mouth daily with breakfast.    [provider]  DULoxetine  (CYMBALTA ) 60 MG capsule TAKE 1 CAPSULE DAILY (REPLACING 30 MG PRESCRIPTION) 12/10/22   Jodie Lavern CROME, MD  esomeprazole  (NEXIUM ) 40 MG capsule TAKE 1 CAPSULE (40 MG TOTAL) BY MOUTH 2 (TWO) TIMES DAILY BEFORE A MEAL. 09/07/23   Webb, Padonda B, FNP  ezetimibe  (ZETIA ) 10 MG tablet TAKE 1 TABLET DAILY 02/13/24   Jodie Lavern CROME, MD  FLUoxetine  (PROZAC ) 10 MG tablet Take 1 tablet (10 mg total) by mouth daily. 03/05/23   Jodie Lavern CROME, MD  fluticasone  (FLONASE ) 50 MCG/ACT nasal spray USE 1 SPRAY IN EACH NOSTRIL DAILY AS NEEDED FOR ALLERGIES 03/22/22   Jodie Lavern CROME, MD  gabapentin  (NEURONTIN ) 300 MG capsule TAKE 1 CAPSULE THREE TIMES A DAY 09/19/23   Jodie Lavern CROME, MD  hydrOXYzine  (VISTARIL ) 25 MG capsule TAKE 1-2 CAPSULES (25-50 MG TOTAL) BY MOUTH EVERY 8 (EIGHT) HOURS AS NEEDED FOR ITCHING OR ANXIETY. 04/09/24   Jodie Lavern CROME, MD  levocetirizine (XYZAL) 5 MG tablet Take 5 mg by mouth  2 (two) times daily.    [provider]  montelukast  (SINGULAIR ) 10 MG tablet Take 1 tablet (10 mg total) by mouth at bedtime. 01/29/24   Jodie Lavern CROME, MD  Polyvinyl Alcohol-Povidone PF (REFRESH) 1.4-0.6 % SOLN Place 1 drop into both eyes daily as needed (Dry eye).    [provider]  pravastatin  (PRAVACHOL ) 10 MG tablet TAKE 1 TABLET BY MOUTH EVERY DAY 04/09/24   Trudy Birmingham, PA-C  triamcinolone  cream (KENALOG ) 0.1 %  09/25/22   [provider]    Family History Family History  Problem Relation Age of Onset   Hypertension Mother    Congestive Heart Failure Mother    Multiple myeloma Mother    Heart disease Father    Prostate cancer Father    Emphysema Sister    Heart disease Brother    Other Brother        brain tumor   Heart disease Maternal Grandmother    Emphysema Maternal Grandmother    CAD Maternal Grandfather    Heart disease Maternal Grandfather    Cancer Paternal Grandmother        type unknown, mets   Alcoholism Paternal Grandfather    Colon cancer Neg Hx    Esophageal cancer Neg Hx    Stomach cancer Neg Hx    Rectal cancer Neg Hx     Social History Social History   Tobacco Use   Smoking status: Never   Smokeless tobacco: Never  Vaping Use   Vaping status: Never Used  Substance Use Topics   Alcohol use: No   Drug use: Never     Allergies   Codeine  and Sulfa antibiotics   Review of Systems Review of Systems  Gastrointestinal:  Positive for diarrhea.   See HPI  Physical Exam Triage Vital Signs ED Triage Vitals  Encounter Vitals Group     BP 04/09/24 1147 127/87     Girls Systolic BP Percentile --      Girls Diastolic BP Percentile --      Boys Systolic BP Percentile --      Boys Diastolic BP Percentile --      Pulse Rate 04/09/24 1147 100     Resp 04/09/24 1147 16     Temp 04/09/24 1147  98.3 F (36.8 C)     Temp Source 04/09/24 1147 Oral     SpO2 04/09/24 1147 96 %     Weight --      Height --      Head  Circumference --      Peak Flow --      Pain Score 04/09/24 1146 0     Pain Loc --      Pain Education --      Exclude from Growth Chart --    No data found.  Updated Vital Signs BP 127/87 (BP Location: Right Arm)   Pulse 100   Temp 98.3 F (36.8 C) (Oral)   Resp 16   SpO2 96%   Visual Acuity Right Eye Distance:   Left Eye Distance:   Bilateral Distance:    Right Eye Near:   Left Eye Near:    Bilateral Near:     Physical Exam Vitals and nursing note reviewed.  Constitutional:      General: She is not in acute distress.    Appearance: Normal appearance. She is not ill-appearing, toxic-appearing or diaphoretic.  Pulmonary:     Effort: Pulmonary effort is normal.  Skin:    General: Skin is warm and dry.  Neurological:     Mental Status: She is alert.  Psychiatric:        Mood and Affect: Mood normal.      UC Treatments / Results  Labs (all labs ordered are listed, but only abnormal results are displayed) Labs Reviewed - No data to display  EKG   Radiology No results found.  Procedures Procedures (including critical care time)  Medications Ordered in UC Medications - No data to display  Initial Impression / Assessment and Plan / UC Course  I have reviewed the triage vital signs and the nursing notes.  Pertinent labs & imaging results that were available during my care of the patient were reviewed by me and considered in my medical decision making (see chart for details).     Diarrhea- most likely related to stress and IBS. Doubt contagious. She can try the Zofran  she has at home for nausea and slow down the GI motility. She can also do OTC anti diarrhea medication if needed. If persists recommend following up with her PCP.  Final Clinical Impressions(s) / UC Diagnoses   Final diagnoses:  Diarrhea, unspecified type     Discharge Instructions      I believe that the diarrhea could be stress related. Doubtful that you area contagious. Could also  be something you ingested.  Try the Zofran  that you have at home to see if this helps with the diarrhea and nausea.  Follow up with your doctor next week if symptoms persist.     ED Prescriptions   None    PDMP not reviewed this encounter.   Adah Wilbert LABOR, FNP 04/11/24 [phone]

## 2024-04-18 ENCOUNTER — Other Ambulatory Visit: Payer: Self-pay | Admitting: Family Medicine

## 2024-04-18 DIAGNOSIS — I10 Essential (primary) hypertension: Secondary | ICD-10-CM

## 2024-05-04 ENCOUNTER — Ambulatory Visit: Payer: Self-pay

## 2024-05-04 NOTE — Telephone Encounter (Signed)
    FYI Only or Action Required?: FYI only for provider.  Patient was last seen in primary care on 03/16/2024 by Jodie Lavern CROME, MD.  Called Nurse Triage reporting Headache.  Symptoms began a week ago.  Interventions attempted: OTC medications: ibuprofen/tylenol .  Symptoms are: gradually worsening.  Triage Disposition: See PCP When Office is Open (Within 3 Days)  Patient/caregiver understands and will follow disposition?: Yes   Copied from CRM [id number]. Topic: Clinical - Red Word Triage >> May 04, 2024 10:11 AM Charlet HERO wrote: Red Word that prompted transfer to Nurse Triage: patient having  headache from the top of her head down the to the base of her skull and in her shoulder blades all on the left side. Also sore to the touch and different ways she moves her head it feels tight. Reason for Disposition  [1] MILD-MODERATE headache AND [2] present > 3 days (72 hours) AND [3] no improvement after using Care Advice  Answer Assessment - Initial Assessment Questions 1. LOCATION: Where does it hurt?  headache from the top of her head down the to the base of her skull and in her shoulder blades all on the left side.  2. ONSET: When did the headache start? (e.g., minutes, hours, days)          X several weeks and worsening x 1 week 3. PATTERN: Does the pain come and go, or has it been constant since it started?     constant 4. SEVERITY: How bad is the pain? and What does it keep you from doing?  (e.g., Scale 1-10; mild, moderate, or severe)     7/10 5. RECURRENT SYMPTOM: Have you ever had headaches before? If Yes, ask: When was the last time? and What happened that time?      no 6. CAUSE: What do you think is causing the headache?     stress 7. MIGRAINE: Have you been diagnosed with migraine headaches? If Yes, ask: Is this headache similar?      Yes - cluster headaches that occur on right side 8. HEAD INJURY: Has there been any recent injury to your head?       no 9. OTHER SYMPTOMS: Do you have any other symptoms? (e.g., fever, stiff neck, eye pain, sore throat, cold symptoms)     no 10. PREGNANCY: Is there any chance you are pregnant? When was your last menstrual period?       na  Protocols used: Headache-A-AH

## 2024-05-04 NOTE — Telephone Encounter (Signed)
 Appt tomorrow.

## 2024-05-05 ENCOUNTER — Encounter: Payer: Self-pay | Admitting: Family

## 2024-05-05 ENCOUNTER — Ambulatory Visit (INDEPENDENT_AMBULATORY_CARE_PROVIDER_SITE_OTHER): Admitting: Family

## 2024-05-05 VITALS — BP 130/87 | HR 90 | Temp 98.2°F | Ht 63.5 in

## 2024-05-05 DIAGNOSIS — R519 Headache, unspecified: Secondary | ICD-10-CM

## 2024-05-05 MED ORDER — METHYLPREDNISOLONE ACETATE 40 MG/ML IJ SUSP
40.0000 mg | Freq: Once | INTRAMUSCULAR | Status: AC
  Administered 2024-05-05: 40 mg via INTRAMUSCULAR

## 2024-05-05 MED ORDER — CYCLOBENZAPRINE HCL 5 MG PO TABS: 5.0000 mg | ORAL_TABLET | Freq: Three times a day (TID) | ORAL | 1 refills | Status: AC | PRN

## 2024-05-05 NOTE — Progress Notes (Unsigned)
 Patient ID: Margaret White, female    DOB: December 22, 1950, 73 y.o.   MRN: [id number]  Chief Complaint  Patient presents with  . Headache    Pt c/o headache on left side, Present for almost 2 weeks. Has tried tylenol  and Excedrin which does not help sx.   Discussed the use of AI scribe software for clinical note transcription with the patient, who gave verbal consent to proceed.  History of Present Illness Margaret White is a 73 year old female with a history of cluster headaches who presents with a persistent headache.  Cephalalgia and associated symptoms - Persistent headache distinct from usual cluster headaches - Pain localized to the left side, starting at the base of the skull, radiating upward, then down to the ear, neck, and shoulders - Severe intensity, disrupts sleep - Cutaneous sensitivity and tenderness in the affected area, complicating CPAP mask use for sleep apnea - Swelling on one side of the neck associated with the headache - Ear pain around the outside of the ear on the affected side - No sore throat, jaw pain, or temporomandibular joint symptoms  Medication response and adverse effects - Trial of Tylenol , Excedrin Migraine, and Excedrin Tension without relief - Avoids ibuprofen due to history of fatty liver - Rapid tolerance to medications, with reduced effectiveness after initial doses - No new medications recently - Gabapentin  used for fibromyalgia - Benadryl  previously used for hives, no longer causes drowsiness - Fluorescent yellow urine attributed to medication use  Dermatologic and allergic manifestations - History of contact dermatitis and hives within the past year - Suspects possible relation between prior dermatologic symptoms and current neck swelling  Xerostomia - Mouth feels dry and cottony, attributed to medication use  Psychosocial stressors - Recent increased stress due to husband's fall and femur fracture, perceived as a potential contributor  to headache  Assessment & Plan Persistent left-sided headache with scalp tenderness Persistent headache with scalp tenderness, radiating pain, and skin sensitivity. No migraine or cluster headache history. Possible tension headache or viral etiology. Stress-related component considered. No infection signs. Gabapentin  use noted with potential interaction with muscle relaxants. - Administered Toradol  shot for immediate relief. - Administered steroid shot to reduce inflammation. - Prescribed Flexeril  at lowest dose for muscle relaxation, cautioning drowsiness and interaction with gabapentin . - Advised use of ice packs for symptomatic relief. - Encouraged hydration and maintaining nutrition.     Subjective:    Outpatient Medications Prior to Visit  Medication Sig Dispense Refill  . amitriptyline  (ELAVIL ) 25 MG tablet Take 1 tablet (25 mg total) by mouth at bedtime. 90 tablet 3  . amLODipine  (NORVASC ) 10 MG tablet TAKE 1 TABLET BY MOUTH DAILY 90 tablet 3  . budesonide -formoterol  (SYMBICORT ) 160-4.5 MCG/ACT inhaler Inhale 2 puffs into the lungs 2 (two) times daily. 1 each 11  . calcium carbonate (OS-CAL) 600 MG TABS tablet Take 600 mg by mouth daily with breakfast.    . DULoxetine  (CYMBALTA ) 60 MG capsule TAKE 1 CAPSULE DAILY (REPLACING 30 MG PRESCRIPTION) 90 capsule 3  . esomeprazole  (NEXIUM ) 40 MG capsule TAKE 1 CAPSULE (40 MG TOTAL) BY MOUTH 2 (TWO) TIMES DAILY BEFORE A MEAL. 180 capsule 3  . ezetimibe  (ZETIA ) 10 MG tablet TAKE 1 TABLET DAILY 90 tablet 3  . FLUoxetine  (PROZAC ) 10 MG tablet Take 1 tablet (10 mg total) by mouth daily. 90 tablet 3  . fluticasone  (FLONASE ) 50 MCG/ACT nasal spray USE 1 SPRAY IN EACH NOSTRIL DAILY AS NEEDED FOR ALLERGIES 48 g 5  .  gabapentin  (NEURONTIN ) 300 MG capsule TAKE 1 CAPSULE THREE TIMES A DAY 270 capsule 3  . hydrOXYzine  (VISTARIL ) 25 MG capsule TAKE 1-2 CAPSULES (25-50 MG TOTAL) BY MOUTH EVERY 8 (EIGHT) HOURS AS NEEDED FOR ITCHING OR ANXIETY. 270 capsule  1  . levocetirizine (XYZAL) 5 MG tablet Take 5 mg by mouth 2 (two) times daily.    . montelukast  (SINGULAIR ) 10 MG tablet TAKE 1 TABLET BY MOUTH EVERYDAY AT BEDTIME 90 tablet 1  . Polyvinyl Alcohol-Povidone PF (REFRESH) 1.4-0.6 % SOLN Place 1 drop into both eyes daily as needed (Dry eye).    . pravastatin  (PRAVACHOL ) 10 MG tablet TAKE 1 TABLET BY MOUTH EVERY DAY 90 tablet 0  . triamcinolone  cream (KENALOG ) 0.1 %      No facility-administered medications prior to visit.   Past Medical History:  Diagnosis Date  . Allergic rhinitis   . Allergy   . Anxiety   . Asthma   . Avascular necrosis of hip, right (HCC) 03/24/2018  . Cataract   . Chronic cluster headache   . Chronic sinusitis   . Diabetes mellitus type 2, diet-controlled (HCC)    per pt currently no taking metformin , followed by pcp  . Eczema   . Essential hypertension    cardiologsit-- dr dr monetta  . Family history of abdominal aortic aneurysm (AAA) 07/03/2022   brother  . Fatty liver   . Fibromyalgia   . GERD (gastroesophageal reflux disease)   . Hepatic steatosis 10/14/2017  . Hiatal hernia   . 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 History of colon polyps   . History of hyperthyroidism 2005  . History of pneumonia, recurrent   . History of seizure 2006   x1  . IBS (irritable bowel syndrome)   . IBS (irritable bowel syndrome)   . Mixed hyperlipidemia   . Nodule of lower lobe of left lung 10/2018   5mm;   pulmologist-- dr lillette. adine, stable per lov note in epic  . Ocular rosacea   . OSA (obstructive sleep apnea)    per pt intolerant to cpap due to sinus issues,  uses mouth guard   . 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 Pneumonia   . Primary osteoarthritis of both hands 07/10/2017  . PTSD (post-traumatic stress disorder)   . Recurrent major depressive disorder, in full remission (HCC) 06/26/2017  . RLS (restless legs syndrome)   . Seasonal allergies   . Seizure (HCC) 2006   x 1  . Sleep apnea   . Solitary pulmonary nodule    pulmologist-- dr i. adine , lov note in epic,  stable  . 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 Shallow disc bulging without significant neuroforaminal stenosis.  Margaret White (stress urinary incontinence, female)   . Vitamin B 12 deficiency   . Vitamin D  deficiency    Past Surgical History:  Procedure Laterality Date  . ABDOMINOPLASTY  2006  . ANAL FISSURE REPAIR  2006  . BIOPSY  10/27/2020   Procedure: BIOPSY;  Surgeon: Mansouraty, Aloha Raddle., MD;  Location: [facility] ENDOSCOPY;  Service: Gastroenterology;;  . BLADDER SURGERY  1984  . BREAST BIOPSY Right     benign    . CARPAL TUNNEL RELEASE Bilateral 1989  . CATARACT EXTRACTION W/ INTRAOCULAR LENS  IMPLANT, BILATERAL  2017  . COLONOSCOPY    . COLONOSCOPY W/ POLYPECTOMY  2017  . ECTOPIC PREGNANCY SURGERY  1979  . ESOPHAGOGASTRODUODENOSCOPY (EGD) WITH PROPOFOL  N/A 10/27/2020   Procedure: ESOPHAGOGASTRODUODENOSCOPY (EGD) WITH PROPOFOL ;  Surgeon: Wilhelmenia Aloha Raddle., MD;  Location: [facility] ENDOSCOPY;  Service: Gastroenterology;  Laterality: N/A;  . EYE SURGERY  child    x4  1957-1960  . INCONTINENCE SURGERY    . NASAL SEPTUM SURGERY  1987  . PUBOVAGINAL SLING N/A 03/24/2019   Procedure: CYSTOSCOPY  Margaret White;  Surgeon: Gaston Hamilton, MD;  Location: [facility];  Service: Urology;  Laterality: N/A;  . REVISION TOTAL HIP ARTHROPLASTY Right   . RHINOPLASTY  2007  . TOTAL ABDOMINAL HYSTERECTOMY W/ BILATERAL SALPINGOOPHORECTOMY  1984   w/ Marshall-Marchetti (bladder tack suspension)  . TOTAL  HIP ARTHROPLASTY Right 04/11/2018   Procedure: RIGHT TOTAL HIP ARTHROPLASTY ANTERIOR APPROACH;  Surgeon: Vernetta Lonni GRADE, MD;  Location: [facility] ORS;  Service: Orthopedics;  Laterality: Right;  . ULNAR NERVE TRANSPOSITION Left 1990s  . UPPER ESOPHAGEAL ENDOSCOPIC ULTRASOUND (EUS) N/A 10/27/2020   Procedure: UPPER ESOPHAGEAL ENDOSCOPIC ULTRASOUND (EUS);  Surgeon: Wilhelmenia Aloha Raddle., MD;  Location: [facility] ENDOSCOPY;  Service: Gastroenterology;  Laterality: N/A;   Allergies  Allergen Reactions  . Codeine  Itching  . Sulfa Antibiotics Rash and Other (See Comments)    Flu like symptoms      Objective:    Physical Exam Vitals and nursing note reviewed.  Constitutional:      Appearance: Normal appearance.  Cardiovascular:     Rate and Rhythm: Normal rate and regular rhythm.  Pulmonary:     Effort: Pulmonary effort is normal.     Breath sounds: Normal breath sounds.  Musculoskeletal:        General: Normal range of motion.  Skin:    General: Skin is warm and dry.  Neurological:     Mental Status: She is alert.  Psychiatric:        Mood and Affect: Mood normal.        Behavior: Behavior normal.    BP 130/87 (BP Location: Left Arm, Patient Position: Sitting, Cuff Size: Large)   Pulse 90   Temp 98.2 F (36.8 C) (Temporal)   Ht 5' 3.5 (1.613 m)   SpO2 95%   BMI 32.43 kg/m  Wt Readings from Last 3 Encounters:  03/16/24 186 lb (84.4 kg)  02/27/24 185 lb (83.9 kg)  01/29/24 185 lb (83.9 kg)      Lucius Krabbe, NP

## 2024-05-12 ENCOUNTER — Other Ambulatory Visit: Payer: Self-pay | Admitting: Family Medicine

## 2024-05-12 NOTE — Telephone Encounter (Signed)
 05/05/2024 LOV  04/09/2024 fill date  270/0 refills

## 2024-05-13 ENCOUNTER — Ambulatory Visit: Payer: Self-pay

## 2024-05-13 NOTE — Telephone Encounter (Signed)
 FYI Only or Action Required?: FYI only for provider.  Patient was last seen in primary care on 05/05/2024 by Lucius Krabbe, NP.  Called Nurse Triage reporting Rash.  Symptoms began today.  Interventions attempted: OTC medications: OTC sinus medication/Tylenol .  Symptoms are: unchanged.  Triage Disposition: See PCP When Office is Open (Within 3 Days)  Patient/caregiver understands and will follow disposition?: Yes  Appt. Scheduled for 8/7**           Copied from CRM #[id number]. Topic: Clinical - Red Word Triage >> May 13, 2024  1:04 PM Shereese L wrote: [facility] that prompted transfer to Nurse Triage: Chills, fever, rash all over her torso and unaware if its an allergic reaction and In pain Reason for Disposition  Mild widespread rash  (Exception: Heat rash lasting 3 days or less.)  Answer Assessment - Initial Assessment Questions 1. APPEARANCE of RASH: What does the rash look like? (e.g., blisters, dry flaky skin, red spots, redness, sores)     Small, red dots  2. SIZE: How big are the spots? (e.g., tip of pen, eraser, coin; inches, centimeters)     Tip of pinky finger in size, some are smaller  3. LOCATION: Where is the rash located?     All over  4. COLOR: What color is the rash? (Note: It is difficult to assess rash color in people with darker-colored skin. When this situation occurs, simply ask the caller to describe what they see.)     red  5. ONSET: When did the rash begin?     X 1 hour  6. FEVER: Do you have a fever? If Yes, ask: What is your temperature, how was it measured, and when did it start?     Not currently, reports fever of 100.0 on Monday  7. ITCHING: Does the rash itch? If Yes, ask: How bad is the itch? (Scale 1-10; or mild, moderate, severe)     No itch  8. CAUSE: What do you think is causing the rash?     Muscle relaxer prescribed on 7/29  9. MEDICINE FACTORS: Have you started any new medicines within the last 2  weeks? (e.g., antibiotics)      Yes  10. OTHER SYMPTOMS: Do you have any other symptoms? (e.g., dizziness, headache, sore throat, joint pain)       Fever, chills intermittently, fatigue.  For home care she took OTC sinus medication. Tylenol  for the fever.  Protocols used: Rash or Redness - [facility]

## 2024-05-13 NOTE — Telephone Encounter (Signed)
 Noted

## 2024-05-14 ENCOUNTER — Ambulatory Visit (INDEPENDENT_AMBULATORY_CARE_PROVIDER_SITE_OTHER): Admitting: Family Medicine

## 2024-05-14 ENCOUNTER — Ambulatory Visit: Payer: Self-pay | Admitting: Family Medicine

## 2024-05-14 VITALS — BP 115/77 | HR 84 | Temp 98.0°F | Ht 63.5 in | Wt 177.4 lb

## 2024-05-14 DIAGNOSIS — R5383 Other fatigue: Secondary | ICD-10-CM | POA: Diagnosis not present

## 2024-05-14 DIAGNOSIS — R509 Fever, unspecified: Secondary | ICD-10-CM

## 2024-05-14 DIAGNOSIS — R7303 Prediabetes: Secondary | ICD-10-CM | POA: Diagnosis not present

## 2024-05-14 LAB — COMPREHENSIVE METABOLIC PANEL WITH GFR
ALT: 55 U/L — ABNORMAL HIGH (ref 0–35)
AST: 47 U/L — ABNORMAL HIGH (ref 0–37)
Albumin: 4 g/dL (ref 3.5–5.2)
Alkaline Phosphatase: 84 U/L (ref 39–117)
BUN: 16 mg/dL (ref 6–23)
CO2: 20 meq/L (ref 19–32)
Calcium: 9.1 mg/dL (ref 8.4–10.5)
Chloride: 101 meq/L (ref 96–112)
Creatinine, Ser: 0.96 mg/dL (ref 0.40–1.20)
GFR: 59.45 mL/min — ABNORMAL LOW (ref >60.00)
Glucose, Bld: 100 mg/dL — ABNORMAL HIGH (ref 70–99)
Potassium: 3.6 meq/L (ref 3.5–5.1)
Sodium: 141 meq/L (ref 135–145)
Total Bilirubin: 0.5 mg/dL (ref 0.2–1.2)
Total Protein: 6.9 g/dL (ref 6.0–8.3)

## 2024-05-14 LAB — HEMOGLOBIN A1C: Hgb A1c MFr Bld: 7.4 % — ABNORMAL HIGH (ref 4.6–6.5)

## 2024-05-14 LAB — CBC WITH DIFFERENTIAL/PLATELET
Basophils Absolute: 0.1 K/uL (ref 0.0–0.1)
Basophils Relative: 0.6 % (ref 0.0–3.0)
Eosinophils Absolute: 0 K/uL (ref 0.0–0.7)
Eosinophils Relative: 0.4 % (ref 0.0–5.0)
HCT: 41.6 % (ref 36.0–46.0)
Hemoglobin: 13.5 g/dL (ref 12.0–15.0)
Lymphocytes Relative: 22.6 % (ref 12.0–46.0)
Lymphs Abs: 1.8 K/uL (ref 0.7–4.0)
MCHC: 32.3 g/dL (ref 30.0–36.0)
MCV: 83.1 fl (ref 78.0–100.0)
Monocytes Absolute: 0.7 K/uL (ref 0.1–1.0)
Monocytes Relative: 8.1 % (ref 3.0–12.0)
Neutro Abs: 5.6 K/uL (ref 1.4–7.7)
Neutrophils Relative %: 68.3 % (ref 43.0–77.0)
Platelets: 256 K/uL (ref 150.0–400.0)
RBC: 5.01 Mil/uL (ref 3.87–5.11)
RDW: 16.4 % — ABNORMAL HIGH (ref 11.5–15.5)
WBC: 8.2 K/uL (ref 4.0–10.5)

## 2024-05-14 LAB — URINALYSIS, MICROSCOPIC ONLY
RBC / HPF: NONE SEEN (ref 0–?)
WBC, UA: NONE SEEN (ref 0–?)

## 2024-05-14 LAB — TSH: TSH: 3.19 u[IU]/mL (ref 0.35–5.50)

## 2024-05-14 LAB — POC COVID19 BINAXNOW: SARS Coronavirus 2 Ag: NEGATIVE

## 2024-05-14 MED ORDER — AMOXICILLIN-POT CLAVULANATE 875-125 MG PO TABS: 1.0000 | ORAL_TABLET | Freq: Two times a day (BID) | ORAL | 0 refills | Status: AC

## 2024-05-14 NOTE — Progress Notes (Signed)
 Subjective  CC:  Chief Complaint  Patient presents with   Rash    Pt stated that she noticed a rash on her back and around her abdomin yesterday. Pt stated that she also stopped taking the muscle relaxer just in case that was the cause.. sharp pain in groin area and around ear,white residue around her mouth    HPI: Margaret White is a 73 y.o. female who presents to the office today to address the problems listed above in the chief complaint. Discussed the use of AI scribe software for clinical note transcription with the patient, who gave verbal consent to proceed.  History of Present Illness Margaret White is a 73 year old female who presents with fatigue, fever, and a new rash. She is accompanied by her oldest daughter, Josette.  She has been experiencing significant fatigue, which she attributes to stress, particularly due to her husband's recent hospitalizations. The fatigue is impacting her daily life, making her feel extremely tired and not herself.  She developed a rash around her trunk area, which she noticed yesterday. The rash is not itchy and is less noticeable today. She has been running a fever for the last two days, with a maximum temperature of 101.40F, and has experienced night sweats, waking up soaked from head to foot.  Her symptoms began with exhaustion and flu-like symptoms, including achiness and fatigue, about a week ago. She has experienced intermittent fever since then, with the last episode occurring on Monday. She also reports a sore throat in the mornings, which resolves during the day, and a sticky mouth requiring hydration to alleviate.  She uses a CPAP machine and has noticed increased drainage, which she describes as thicker than usual. No sinus pain, but she reports jaw pain behind her neck. She experiences shortness of breath without a cough, and has had diarrhea twice in the past week.  She reports a lack of appetite, stating that food does not taste  good, and she forces herself to eat. She also experiences dry mouth and is trying to stay hydrated, managing to drink two bottles of water  a day.  She has been taking medication for fever, which resolves the fever within fifteen minutes. She reports urinary hesitancy, feeling the need to urinate but taking a while to do so.   Assessment  1. Fever, unspecified fever cause   2. Prediabetes   3. Other fatigue      Plan  Assessment and Plan Assessment & Plan Acute febrile illness with fatigue, upper respiratory symptoms, and rash Intermittent fever, fatigue, upper respiratory symptoms, and trunk rash suggest viral infection or sinusitis. Differential includes viral illness and sinusitis. Blood work and urinalysis needed to rule out other causes. Empirical antibiotics may be initiated for possible sinusitis. COVID test considered due to taste changes, myalgia, and fatigue. - Order blood work and urinalysis. - Prescribe antibiotics to cover for possible sinusitis. - Perform COVID test. Negative  - Advise rest and hydration. - Recommend Gatorade for hydration.  Chronic stress Chronic stress from husband's hospitalizations and personal health issues may contribute to illness and fatigue. - Advise rest and mental stress management.  Check A1c: concern for diabetes now.    Follow up: 2 weeks for recheck Orders Placed This Encounter  Procedures   CBC with Differential/Platelet   Comprehensive metabolic panel with GFR   Hemoglobin A1c   TSH   No orders of the defined types were placed in this encounter.    I reviewed the patients  updated PMH, FH, and SocHx.  Patient Active Problem List   Diagnosis Date Noted   Prediabetes 02/09/2021    Priority: High   Tubular adenoma of colon 08/19/2019    Priority: High   DOE (dyspnea on exertion) 05/14/2018    Priority: High   Statin intolerance 03/25/2018    Priority: High   GERD (gastroesophageal reflux disease) 10/14/2017    Priority:  High   NASH (nonalcoholic steatohepatitis) 10/14/2017    Priority: High   OSA (obstructive sleep apnea) 10/14/2017    Priority: High   Fibromyalgia 10/14/2017    Priority: High   Obesity (BMI 30-39.9) 10/14/2017    Priority: High   Mixed hyperlipidemia 10/14/2017    Priority: High   Essential hypertension 06/26/2017    Priority: High   Osteopenia 05/21/2023    Priority: Medium    Incontinence without sensory awareness 05/08/2018    Priority: Medium    Mixed incontinence 05/08/2018    Priority: Medium    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    Priority: Medium    Bilateral primary osteoarthritis of hip 11/05/2017    Priority: Medium    Spondylosis of lumbar region without myelopathy or radiculopathy 11/04/2017    Priority: Medium    IBS (irritable bowel syndrome) 10/14/2017    Priority: Medium    Major depression, recurrent, chronic (HCC) 06/26/2017    Priority: Medium    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    Priority: Low   Allergic rhinitis 10/14/2017    Priority: Low   History of cluster headache 10/14/2017    Priority: Low   Chronic maxillary sinusitis 10/14/2017    Priority: Low   Vitamin B12 deficiency 10/14/2017    Priority: Low   Vitamin D  deficiency 10/14/2017    Priority: Low   Female cystocele 10/14/2017    Priority: Low   Primary osteoarthritis of both hands 07/10/2017    Priority: Low   Iliotibial band syndrome of left side 12/23/2023   Hypersomnia, periodic 01/02/2023   Hypnapompic hallucinations 01/02/2023   Sleep paralysis, recurrent isolated 01/02/2023   Nightmares 01/02/2023   Episodic cluster headache, not intractable 01/02/2023   Heart murmur, systolic 08/27/2022   Family history of abdominal aortic aneurysm (AAA) 07/03/2022   Periodic limb movement sleep disorder 08/15/2021   No outpatient medications have been marked as taking for the 05/14/24 encounter (Office Visit) with Jodie Lavern CROME, MD.   Allergies: Patient is allergic to  codeine  and sulfa antibiotics. Family History: Patient family history includes Alcoholism in her paternal grandfather; CAD in her maternal grandfather; Cancer in her paternal grandmother; Congestive Heart Failure in her mother; Emphysema in her maternal grandmother and sister; Heart disease in her brother, father, maternal grandfather, and maternal grandmother; Hypertension in her mother; Multiple myeloma in her mother; Other in her brother; Prostate cancer in her father. Social History:  Patient  reports that she has never smoked. She has never used smokeless tobacco. She reports that she does not drink alcohol and does not use drugs.  Review of Systems: Constitutional: Negative for fever malaise or anorexia Cardiovascular: negative for chest pain Respiratory: negative for SOB or persistent cough Gastrointestinal: negative for abdominal pain  Objective  Vitals: BP 115/77   Pulse 84   Temp 98 F (36.7 C)   Ht 5' 3.5 (1.613 m)   Wt 177 lb 6.4 oz (80.5 kg)   SpO2 96%   BMI 30.93 kg/m  General: no acute distress , A&Ox3, tearful. nontoxic HEENT: PEERL, conjunctiva  normal, neck is supple, op red. Tm's ok. No sinus ttp Cardiovascular:  RRR without murmur or gallop.  Respiratory:  Good breath sounds bilaterally, CTAB with normal respiratory effort Soft nontender abd Skin:  Warm, minimal papular rash on torso Commons side effects, risks, benefits, and alternatives for medications and treatment plan prescribed today were discussed, and the patient expressed understanding of the given instructions. Patient is instructed to call or message via MyChart if he/she has any questions or concerns regarding our treatment plan. No barriers to understanding were identified. We discussed Red Flag symptoms and signs in detail. Patient expressed understanding regarding what to do in case of urgent or emergency type symptoms.  Medication list was reconciled, printed and provided to the patient in AVS. Patient  instructions and summary information was reviewed with the patient as documented in the AVS. This note was prepared with assistance of Dragon voice recognition software. Occasional wrong-word or sound-a-like substitutions may have occurred due to the inherent limitations of voice recognition software

## 2024-05-14 NOTE — Progress Notes (Signed)
 Call pt: urine looks ok. Labs look fine except pt with diabetes now. This can explain the dry mouth and fatigue. Also, liver enzymes are a little high. Avoid tylenol . Could be due to many things but the elevation is minimal. Please schedule a follow up visit in 2 weeks for recheck after completing her abx and to discuss diabetic care.

## 2024-05-14 NOTE — Patient Instructions (Signed)
Please return in 3 months for recheck.  ° °If you have any questions or concerns, please don't hesitate to send me a message via MyChart or call the office at [phone]. Thank you for visiting with us today! It's our pleasure caring for you.  °

## 2024-05-15 LAB — URINE CULTURE
MICRO NUMBER:: 16800930
Result:: NO GROWTH
SPECIMEN QUALITY:: ADEQUATE

## 2024-05-18 ENCOUNTER — Telehealth: Payer: Self-pay

## 2024-05-18 NOTE — Telephone Encounter (Signed)
 Copied from CRM [id number]. Topic: Clinical - Lab/Test Results >> May 18, 2024  8:41 AM Ivette P wrote: Reason for CRM: Pt called in about lab results, called [facility] per notes from provider wanting nurse to go over results.   Dear Ms. Gerome, My nurse will call you to go over these results. Sincerely, Dr. Jodie     Written by Lavern LITTIE Jodie, MD on 05/14/2024  7:35 PM EDT  Called [facility] and spoke to [place] and was advised that nurse was advised to call pt back.   Admin:  Pls follow up with pt regarding results.  Spoke with pt regarding lab results. Please contact pt to schedule a 2 weeks F/U for Diabetes

## 2024-05-20 MED ORDER — FLUCONAZOLE 150 MG PO TABS
ORAL_TABLET | ORAL | 0 refills | Status: DC
Start: 2024-05-20 — End: 2024-08-25

## 2024-05-26 ENCOUNTER — Ambulatory Visit: Admitting: Family Medicine

## 2024-06-09 ENCOUNTER — Ambulatory Visit: Admitting: Family Medicine

## 2024-06-09 ENCOUNTER — Encounter: Payer: Self-pay | Admitting: Family Medicine

## 2024-06-09 VITALS — BP 135/88 | HR 90 | Temp 98.1°F | Ht 63.5 in | Wt 174.8 lb

## 2024-06-09 DIAGNOSIS — F339 Major depressive disorder, recurrent, unspecified: Secondary | ICD-10-CM

## 2024-06-09 DIAGNOSIS — I10 Essential (primary) hypertension: Secondary | ICD-10-CM | POA: Diagnosis not present

## 2024-06-09 DIAGNOSIS — E782 Mixed hyperlipidemia: Secondary | ICD-10-CM | POA: Diagnosis not present

## 2024-06-09 DIAGNOSIS — E1169 Type 2 diabetes mellitus with other specified complication: Secondary | ICD-10-CM

## 2024-06-09 DIAGNOSIS — G4733 Obstructive sleep apnea (adult) (pediatric): Secondary | ICD-10-CM

## 2024-06-09 DIAGNOSIS — M797 Fibromyalgia: Secondary | ICD-10-CM

## 2024-06-09 DIAGNOSIS — E119 Type 2 diabetes mellitus without complications: Secondary | ICD-10-CM | POA: Insufficient documentation

## 2024-06-09 DIAGNOSIS — K7581 Nonalcoholic steatohepatitis (NASH): Secondary | ICD-10-CM | POA: Diagnosis not present

## 2024-06-09 DIAGNOSIS — Z7984 Long term (current) use of oral hypoglycemic drugs: Secondary | ICD-10-CM

## 2024-06-09 LAB — COMPREHENSIVE METABOLIC PANEL WITH GFR
ALT: 59 U/L — ABNORMAL HIGH (ref 0–35)
AST: 43 U/L — ABNORMAL HIGH (ref 0–37)
Albumin: 4.2 g/dL (ref 3.5–5.2)
Alkaline Phosphatase: 82 U/L (ref 39–117)
BUN: 16 mg/dL (ref 6–23)
CO2: 20 meq/L (ref 19–32)
Calcium: 9.3 mg/dL (ref 8.4–10.5)
Chloride: 105 meq/L (ref 96–112)
Creatinine, Ser: 0.96 mg/dL (ref 0.40–1.20)
GFR: 59.42 mL/min — ABNORMAL LOW (ref >60.00)
Glucose, Bld: 102 mg/dL — ABNORMAL HIGH (ref 70–99)
Potassium: 3.9 meq/L (ref 3.5–5.1)
Sodium: 141 meq/L (ref 135–145)
Total Bilirubin: 0.5 mg/dL (ref 0.2–1.2)
Total Protein: 7.5 g/dL (ref 6.0–8.3)

## 2024-06-09 LAB — MICROALBUMIN / CREATININE URINE RATIO
Creatinine,U: 86.6 mg/dL
Microalb Creat Ratio: 8.2 mg/g (ref 0.0–30.0)
Microalb, Ur: 0.7 mg/dL (ref 0.0–1.9)

## 2024-06-09 LAB — LIPID PANEL
Cholesterol: 174 mg/dL (ref 0–200)
HDL: 62.3 mg/dL (ref >39.00)
LDL Cholesterol: 80 mg/dL (ref 0–99)
NonHDL: 111.64
Total CHOL/HDL Ratio: 3
Triglycerides: 159 mg/dL — ABNORMAL HIGH (ref 0.0–149.0)
VLDL: 31.8 mg/dL (ref 0.0–40.0)

## 2024-06-09 MED ORDER — EMPAGLIFLOZIN 10 MG PO TABS: 10.0000 mg | ORAL_TABLET | Freq: Every day | ORAL | 3 refills | Status: DC

## 2024-06-09 NOTE — Patient Instructions (Signed)
 Please return in 3 months to recheck diabetes and blood pressure    I will release your lab results to you on your MyChart account with further instructions. You may see the results before I do, but when I review them I will send you a message with my report or have my assistant call you if things need to be discussed. Please reply to my message with any questions. Thank you!   If you have any questions or concerns, please don't hesitate to send me a message via MyChart or call the office at [phone]. Thank you for visiting with us  today! It's our pleasure caring for you.   Diabetes: Healthy Eating for Adults When you have diabetes, also called diabetes mellitus, it's important to have healthy eating habits. Your blood sugar (glucose) levels are greatly affected by what you eat and drink. You need to eat healthy foods in the right amounts, at about the same times each day. Doing this can help you: Manage your blood sugar. Lower your risk of heart disease. Improve your blood pressure. Reach or stay at a healthy weight. What can affect my meal plan? Every person with diabetes is different. And each person has different needs for a meal plan. Your health care provider may suggest that you work with an expert in healthy eating called a dietitian. They can help you make a meal plan that's best for you. How do carbohydrates affect me? Carbohydrates, also called carbs, affect your blood sugar level more than any other type of food. Eating carbs raises the amount of sugar in your blood. It's important to know how many carbs you can safely have in each meal. This is different for every person. Your dietitian can help you calculate how many carbs you should have at each meal and for each snack. How does alcohol affect me? Alcohol can cause a decrease in blood sugar (hypoglycemia), especially if you use insulin  or take certain diabetes medicines by mouth. Hypoglycemia can be a life-threatening condition.  Symptoms of hypoglycemia are similar to those of having too much alcohol. They include confusion, being sleepy, and feeling dizzy. Do not drink alcohol if: Your provider tells you not to drink. You're pregnant, may be pregnant, or plan to become pregnant. What are tips for following this plan? Reading food labels Start by checking the serving size on the Nutrition Facts label of packaged foods and drinks. The number of calories and the amount of carbs, fats, and other nutrients listed on the label are based on one serving of the item. Many items contain more than one serving per package. Check the total grams (g) of carbs in one serving. Check the number of grams of saturated fats and trans fats in one serving. Choose foods that have a low amount or none of these fats. Check the number of milligrams (mg) of salt (sodium) in one serving. Most people should limit their total sodium intake to less than 2,300 mg per day. Always check the nutrition information of foods labeled as low-fat or nonfat. These foods may be higher in added sugar or refined carbs and should be avoided. Talk to your dietitian to identify your daily goals for nutrients listed on the label. Shopping Avoid buying canned, pre-made, or processed foods. These foods tend to be high in fat, sodium, and added sugar. Shop around the outside edge of the grocery store. This is where you'll most often find fresh fruits and vegetables, bulk grains, fresh meats, and fresh dairy products.  Cooking Use low-heat cooking methods, such as baking, instead of high-heat methods like deep frying. Cook using healthy oils, such as olive, canola, or sunflower oil. Avoid cooking with butter, cream, or high-fat meats. Meal planning  Eat meals and snacks regularly. Try to eat them at the same times every day. Avoid going too long without eating. Eat foods that are high in fiber, such as fresh fruits, vegetables, beans, and whole grains. Eat 4-6 oz  (112-168 g) of lean protein each day, such as lean meat, chicken, fish, eggs, or tofu. One ounce (oz) (28 g) of lean protein is equal to: 1 oz (28 g) of meat, chicken, or fish. 1 egg.  cup (62 g) of tofu. Eat some foods each day that contain healthy fats, such as avocado, nuts, seeds, and fish. What foods should I eat? Fruits Berries. Apples. Oranges. Peaches. Apricots. Plums. Grapes. Mangoes. Papayas. Pomegranates. Kiwi. Cherries. Vegetables Leafy greens, including lettuce, spinach, kale, chard, collard greens, mustard greens, and cabbage. Beets. Cauliflower. Broccoli. Carrots. Green beans. Tomatoes. Peppers. Onions. Cucumbers. Brussels sprouts. Grains Whole grains, such as whole-wheat or whole-grain bread, crackers, tortillas, cereal, and pasta. Unsweetened oatmeal. Quinoa. Brown or wild rice. Meats and other proteins Seafood. Poultry without skin. Lean cuts of poultry and beef. Tofu. Nuts. Seeds. Dairy Low-fat or fat-free dairy products such as milk, yogurt, and cheese. The items listed above may not be all the foods and drinks you can have. Talk with a dietitian to learn more. What foods should I avoid? Fruits Fruits canned with syrup. Vegetables Canned vegetables. Frozen vegetables with butter or cream sauce. Grains Refined white flour and flour products such as bread, pasta, snack foods, and cereals. Avoid all processed foods. Meats and other proteins Fatty cuts of meat. Poultry with skin. Breaded or fried meats. Processed meat. Avoid saturated fats. Dairy Full-fat yogurt, cheese, or milk. Beverages Sweetened drinks, such as soda or iced tea. The items listed above may not be all the foods and drinks you should avoid. Talk with a dietitian to learn more. Where to find more information: To learn more, go to: Academy of Nutrition and Dietetics at DeathPrevention.it. Click Search and type diabetes. Find the link you need. Centers for Disease Control and Prevention at  TonerPromos.no. Click Search and type diabetes. Find the link you need. American Diabetes Association: diabetes.org/food-nutrition [employer] of Diabetes and Digestive and Kidney Diseases: StageSync.si This information is not intended to replace advice given to you by your health care provider. Make sure you discuss any questions you have with your health care provider. Document Revised: 09/12/2023 Document Reviewed: 09/12/2023 Elsevier Patient Education  2025 [employer].

## 2024-06-09 NOTE — Progress Notes (Signed)
 Subjective  CC:  Chief Complaint  Patient presents with   Hypertension   Diabetes    HPI: Margaret White is a 73 y.o. female who presents to the office today for follow up of diabetes and problems listed above in the chief complaint.  Discussed the use of AI scribe software for clinical note transcription with the patient, who gave verbal consent to proceed.  History of Present Illness Margaret White is a 73 year old female with diabetes who presents for follow-up after a viral illness.  She feels significantly better since her last visit, where she experienced severe weakness and was unable to open a water  bottle due to lack of energy. The illness was similar to COVID-19, although her test was negative. She also had a rash that has since resolved.  She does not regularly monitor her blood pressure at home. Her diabetes has worsened, with a recent A1c of 7.4, which she attributes to stress and dietary changes during a period of hospitalization. She is unsure about dietary management for diabetes and has difficulty retaining dietary information.  She recalls being previously prescribed metformin  by a different doctor but was taken off it, possibly due to gastrointestinal side effects, as she has a history of irritable bowel syndrome. She reports recent episodes of diarrhea, particularly last night, which took an hour to resolve.  Her social history includes a significant change in her household dynamics, as her husband recently returned from rehab after a hip fracture. She has been adjusting to not catering to his needs as much, which she finds empowering. She also mentions a past episode of depression following a physical injury that limited her mobility.  She has a history of yeast infections, particularly after antibiotic use, but currently does not have one. She keeps medication on hand in case symptoms recur.    Wt Readings from Last 3 Encounters:  06/09/24 174 lb 12.8 oz (79.3  kg)  05/14/24 177 lb 6.4 oz (80.5 kg)  03/16/24 186 lb (84.4 kg)    BP Readings from Last 3 Encounters:  06/09/24 135/88  05/14/24 115/77  05/05/24 130/87    Assessment  1. New onset type 2 diabetes mellitus (HCC)   2. Essential hypertension   3. Fibromyalgia   4. NASH (nonalcoholic steatohepatitis)   5. OSA (obstructive sleep apnea)   6. Major depression, recurrent, chronic (HCC)   7. Mixed hyperlipidemia   8. Hyperlipidemia associated with type 2 diabetes mellitus (HCC) Chronic     Plan  Assessment and Plan Assessment & Plan Type 2 diabetes mellitus A1c of 7.4 indicates poor control, likely due to stress and dietary changes. Intolerance to metformin  noted. - Start jardiance  at low dose, discussed side effects including increased urination and yeast infections. - Order urine test for kidney function. - Provide nutritional guidance in after visit summary. - Recommend annual diabetic eye exam. - Advise regular foot monitoring.  HLD on pravachol  and zetia  Check lipids and lfts.  Essential hypertension Blood pressure slightly elevated, no regular home monitoring. On amlodipine . Will recheck in 3 months and add ace if needed at that time. Sglt2-I may also help.   Nonalcoholic steatohepatitis (NASH) Previous liver function abnormalities, possibly worsened by recent viral illness. - Order blood tests for liver function.  Depression Well-managed with improved mood and energy levels.  Follow-Up - Schedule follow-up appointment in three months. - Send new prescription to pharmacy.    Follow up: 3 mo for recheck Orders Placed This Encounter  Procedures  Microalbumin / creatinine urine ratio   Lipid panel   Comprehensive metabolic panel with GFR   Meds ordered this encounter  Medications   empagliflozin  (JARDIANCE ) 10 MG TABS tablet    Sig: Take 1 tablet (10 mg total) by mouth daily.    Dispense:  90 tablet    Refill:  3      Immunization History   Administered Date(s) Administered   Fluad Quad(high Dose 65+) 06/24/2020   Influenza,inj,Quad PF,6+ Mos 08/06/2017, 07/31/2018   Influenza-Unspecified 07/23/2019, 08/07/2021   Pneumococcal Conjugate-13 03/24/2018   Pneumococcal Polysaccharide-23 08/19/2019   Tdap 04/25/2018   Zoster Recombinant(Shingrix) 08/07/2021, 08/30/2021    Diabetes Related Lab Review: Lab Results  Component Value Date   HGBA1C 7.4 (H) 05/14/2024   HGBA1C 6.6 (H) 01/29/2024   HGBA1C 6.2 03/05/2023    No results found for: MACKEY CURRENT Lab Results  Component Value Date   CREATININE 0.96 05/14/2024   BUN 16 05/14/2024   NA 141 05/14/2024   K 3.6 05/14/2024   CL 101 05/14/2024   CO2 20 05/14/2024   Lab Results  Component Value Date   CHOL 155 07/23/2023   CHOL 196 03/05/2023   CHOL 196 08/28/2022   Lab Results  Component Value Date   HDL 60 07/23/2023   HDL 69.50 03/05/2023   HDL 52.40 08/28/2022   Lab Results  Component Value Date   LDLCALC 75 07/23/2023   LDLCALC 103 (H) 08/28/2022   LDLCALC 104 (H) 02/08/2020   Lab Results  Component Value Date   TRIG 115 07/23/2023   TRIG 220.0 (H) 03/05/2023   TRIG 199.0 (H) 08/28/2022   Lab Results  Component Value Date   CHOLHDL 2.6 07/23/2023   CHOLHDL 3 03/05/2023   CHOLHDL 4 08/28/2022   Lab Results  Component Value Date   LDLDIRECT 97.0 03/05/2023   LDLDIRECT 113.0 02/27/2022   LDLDIRECT 111.0 02/09/2021   The 10-year ASCVD risk score (Arnett DK, et al., 2019) is: 25.9%   Values used to calculate the score:     Age: 35 years     Clincally relevant sex: Female     Is Non-Hispanic African American: No     Diabetic: Yes     Tobacco smoker: No     Systolic Blood Pressure: 135 mmHg     Is BP treated: Yes     HDL Cholesterol: 60 mg/dL     Total Cholesterol: 155 mg/dL I have reviewed the PMH, Fam and Soc history. Patient Active Problem List   Diagnosis Date Noted   New onset type 2 diabetes mellitus (HCC) 06/09/2024     Priority: High    Avoid metformin  due to active IBS; trial of sglt-2i 06/2024    Tubular adenoma of colon 08/19/2019    Priority: High    Tubular adenoma 2017; f/u colonoscopy 08/2021: tubular adenoma and sessile polyps; repeat 5 years. Dr. Luke Brass, [facility]    DOE (dyspnea on exertion) 05/14/2018    Priority: High    Ongoing for years; eval by pulm: chext CT, PFTs and possible asthma treated with advair. + allergies.  Eval by cards: normal Echo and stress test 2023.  Neg lymes eval, neg d-dimer     Statin intolerance 03/25/2018    Priority: High   GERD (gastroesophageal reflux disease) 10/14/2017    Priority: High   NASH (nonalcoholic steatohepatitis) 10/14/2017    Priority: High    Advanced hepatic steatosis by ultrasound; followed by Dr. Brass    OSA (  obstructive sleep apnea) 10/14/2017    Priority: High    Sleep study: 03/2023 Dr. Harriett: We treated this sleep study like a SPLIT protocol , one part without dental device and one part with dental device: the dental device did not have much influence on the patient's apnea , and she has ongoing hypoxia , poor sleep continuity and  PLMs.  PLMs were not related to REM sleep, and therefor this patient;s condition cannot be identified as a REM behavior disorder.  My goal is effective apnea treatment and reduction of arousals and PLMs. Lets try a CPAP  I have suggested 2 medications we can try, please let me know if the patient will take the offer. 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 Shallow disc bulging without significant neuroforaminal stenosis.    IBS (irritable bowel syndrome) 10/14/2017    Priority: Medium    Major depression, recurrent, chronic (HCC) 06/26/2017    Priority: Medium     On long term prozac  and added cymbalta  for fibromyagia in 2022    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    Priority: Low   Allergic rhinitis 10/14/2017    Priority: Low   History of cluster headache 10/14/2017    Priority: Low   Chronic maxillary sinusitis 10/14/2017    Priority: Low   Vitamin B12 deficiency 10/14/2017    Priority: Low   Vitamin D  deficiency 10/14/2017    Priority: Low   Female cystocele 10/14/2017    Priority: Low   Primary osteoarthritis of both hands 07/10/2017    Priority: Low    Last Assessment & Plan:  Try mobic  Stay hydrated  Monitor renal panel    Hypersomnia, periodic 01/02/2023   Hypnapompic hallucinations 01/02/2023   Sleep paralysis, recurrent isolated 01/02/2023   Nightmares 01/02/2023   Episodic cluster headache, not intractable 01/02/2023   Heart murmur, systolic 08/27/2022   Family history of abdominal aortic aneurysm (AAA) 07/03/2022    brother    Periodic limb movement sleep disorder 08/15/2021    Social History: Patient  reports that she has never smoked. She has never used smokeless tobacco. She reports that she does not drink alcohol and  does not use drugs.  Review of Systems: Ophthalmic: negative for eye pain, loss of vision or double vision Cardiovascular: negative  for chest pain Respiratory: negative for SOB or persistent cough Gastrointestinal: negative for abdominal pain Genitourinary: negative for dysuria or gross hematuria MSK: negative for foot lesions Neurologic: negative for weakness or gait disturbance  Objective  Vitals: BP 135/88   Pulse 90   Temp 98.1 F (36.7 C)   Ht 5' 3.5 (1.613 m)   Wt 174 lb 12.8 oz (79.3 kg)   SpO2 93%   BMI 30.48 kg/m  General: well appearing, no acute distress  Psych:  Alert and oriented, normal mood and affect HEENT:  Normocephalic, atraumatic, moist mucous membranes, supple neck  Cardiovascular:  Nl S1 and S2, RRR without murmur, gallop or rub. no edema Respiratory:  Good breath sounds bilaterally, CTAB with normal effort, no rales Gastrointestinal: normal BS, soft, nontender Skin:  Warm, no rashes Neurologic:   Mental status is normal. normal gait Foot exam: no erythema, pallor, or cyanosis visible nl proprioception and sensation to monofilament testing bilaterally, +2 distal pulses bilaterally  Diabetic education: ongoing education regarding chronic disease management for diabetes was given today. We continue to reinforce the ABC's of diabetic management: A1c (<7 or 8 dependent upon patient), tight blood pressure control, and cholesterol management with goal LDL < 100 minimally. We discuss diet strategies, exercise recommendations, medication options and possible side effects. At each visit, we review recommended immunizations and preventive care recommendations for diabetics and stress that good diabetic control can prevent other problems. See below for this patient's data. Commons side effects, risks, benefits, and alternatives for medications and treatment plan prescribed today were discussed, and the patient expressed understanding of the given instructions. Patient is  instructed to call or message via MyChart if he/she has any questions or concerns regarding our treatment plan. No barriers to understanding were identified. We discussed Red Flag symptoms and signs in detail. Patient expressed understanding regarding what to do in case of urgent or emergency type symptoms.  Medication list was reconciled, printed and provided to the patient in AVS. Patient instructions and summary information was reviewed with the patient as documented in the AVS. This note was prepared with assistance of Dragon voice recognition software. Occasional wrong-word or sound-a-like substitutions may have occurred due to the inherent limitations of voice recognition software

## 2024-06-10 ENCOUNTER — Ambulatory Visit: Payer: Self-pay | Admitting: Family Medicine

## 2024-06-10 NOTE — Progress Notes (Signed)
 See mychart note Dear Ms. Blumstein, Your lab results are all stable. Your liver tests remain just above normal but are not worsening. I believe they will improve with time and are related to your recent viral infection although it could be due to inflammation related to your fatty liver as well.  Your cholesterol is stable.  Glad you are feeling better. Sincerely, Dr. Jodie

## 2024-06-11 NOTE — Telephone Encounter (Signed)
 Noted

## 2024-06-17 ENCOUNTER — Other Ambulatory Visit: Payer: Self-pay | Admitting: Family Medicine

## 2024-06-19 DIAGNOSIS — E559 Vitamin D deficiency, unspecified: Secondary | ICD-10-CM | POA: Diagnosis not present

## 2024-06-19 DIAGNOSIS — L650 Telogen effluvium: Secondary | ICD-10-CM | POA: Diagnosis not present

## 2024-06-19 DIAGNOSIS — R6889 Other general symptoms and signs: Secondary | ICD-10-CM | POA: Diagnosis not present

## 2024-06-19 DIAGNOSIS — L209 Atopic dermatitis, unspecified: Secondary | ICD-10-CM | POA: Diagnosis not present

## 2024-06-19 DIAGNOSIS — L299 Pruritus, unspecified: Secondary | ICD-10-CM | POA: Diagnosis not present

## 2024-06-19 DIAGNOSIS — R946 Abnormal results of thyroid function studies: Secondary | ICD-10-CM | POA: Diagnosis not present

## 2024-07-11 ENCOUNTER — Other Ambulatory Visit: Payer: Self-pay | Admitting: Family Medicine

## 2024-07-11 DIAGNOSIS — F3342 Major depressive disorder, recurrent, in full remission: Secondary | ICD-10-CM

## 2024-07-13 ENCOUNTER — Other Ambulatory Visit: Payer: Self-pay | Admitting: *Deleted

## 2024-07-13 NOTE — Telephone Encounter (Signed)
 06/09/2024 LOV  03/05/2023 fill date  90/3 refills

## 2024-07-13 NOTE — Telephone Encounter (Signed)
 Last seen on 07/17/23 Follow up scheduled on 07/16/24   Dispensed Days Supply Quantity Provider Pharmacy  AMITRIPTYLINE  HCL 25 MG TAB 05/07/2024 90 90 each Lomax, Amy, NP [id number]...     Rx denied too soon to refill.
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
## 2024-07-15 NOTE — Patient Instructions (Signed)
 Below is our plan:  We will continue amitriptyline  25mg  daily.   Please continue using your CPAP regularly. While your insurance requires that you use CPAP at least 4 hours each night on 70% of the nights, I recommend, that you not skip any nights and use it throughout the night if you can. Getting used to CPAP and staying with the treatment long term does take time and patience and discipline. Untreated obstructive sleep apnea when it is moderate to severe can have an adverse impact on cardiovascular health and raise her risk for heart disease, arrhythmias, hypertension, congestive heart failure, stroke and diabetes. Untreated obstructive sleep apnea causes sleep disruption, nonrestorative sleep, and sleep deprivation. This can have an impact on your day to day functioning and cause daytime sleepiness and impairment of cognitive function, memory loss, mood disturbance, and problems focussing. Using CPAP regularly can improve these symptoms.  We will update supply orders, today.   Please make sure you are staying well hydrated. I recommend 50-60 ounces daily. Well balanced diet and regular exercise encouraged. Consistent sleep schedule with 6-8 hours recommended.   Please continue follow up with care team as directed.   Follow up with me in 1 year   You may receive a survey regarding today's visit. I encourage you to leave honest feed back as I do use this information to improve patient care. Thank you for seeing me today!   GENERAL HEADACHE INFORMATION:   Natural supplements: Magnesium  Oxide or Magnesium  Glycinate 500 mg at bed (up to 800 mg daily) Coenzyme Q10 300 mg in AM Vitamin B2- 200 mg twice a day   Add 1 supplement at a time since even natural supplements can have undesirable side effects. You can sometimes buy supplements cheaper (especially Coenzyme Q10) at www.WebmailGuide.co.za or at [facility].  Migraine with aura: There is increased risk for stroke in women with migraine with aura and a  contraindication for the combined contraceptive pill for use by women who have migraine with aura. The risk for women with migraine without aura is lower. However other risk factors like smoking are far more likely to increase stroke risk than migraine. There is a recommendation for no smoking and for the use of OCPs without estrogen such as progestogen only pills particularly for women with migraine with aura.SABRA People who have migraine headaches with auras may be 3 times more likely to have a stroke caused by a blood clot, compared to migraine patients who don't see auras. Women who take hormone-replacement therapy may be 30 percent more likely to suffer a clot-based stroke than women not taking medication containing estrogen. Other risk factors like smoking and high blood pressure may be  much more important.    Vitamins and herbs that show potential:   Magnesium : Magnesium  (250 mg twice a day or 500 mg at bed) has a relaxant effect on smooth muscles such as blood vessels. Individuals suffering from frequent or daily headache usually have low magnesium  levels which can be increase with daily supplementation of 400-750 mg. Three trials found 40-90% average headache reduction  when used as a preventative. Magnesium  may help with headaches are aura, the best evidence for magnesium  is for migraine with aura is its thought to stop the cortical spreading depression we believe is the pathophysiology of migraine aura.Magnesium  also demonstrated the benefit in menstrually related migraine.  Magnesium  is part of the messenger system in the serotonin cascade and it is a good muscle relaxant.  It is also useful for  constipation which can be a side effect of other medications used to treat migraine. Good sources include nuts, whole grains, and tomatoes. Side Effects: loose stool/diarrhea  Riboflavin (vitamin B 2) 200 mg twice a day. This vitamin assists nerve cells in the production of ATP a principal energy storing  molecule.  It is necessary for many chemical reactions in the body.  There have been at least 3 clinical trials of riboflavin using 400 mg per day all of which suggested that migraine frequency can be decreased.  All 3 trials showed significant improvement in over half of migraine sufferers.  The supplement is found in bread, cereal, milk, meat, and poultry.  Most Americans get more riboflavin than the recommended daily allowance, however riboflavin deficiency is not necessary for the supplements to help prevent headache. Side effects: energizing, green urine   Coenzyme Q10: This is present in almost all cells in the body and is critical component for the conversion of energy.  Recent studies have shown that a nutritional supplement of CoQ10 can reduce the frequency of migraine attacks by improving the energy production of cells as with riboflavin.  Doses of 150 mg twice a day have been shown to be effective.   Melatonin: Increasing evidence shows correlation between melatonin secretion and headache conditions.  Melatonin supplementation has decreased headache intensity and duration.  It is widely used as a sleep aid.  Sleep is natures way of dealing with migraine.  A dose of 3 mg is recommended to start for headaches including cluster headache. Higher doses up to 15 mg has been reviewed for use in Cluster headache and have been used. The rationale behind using melatonin for cluster is that many theories regarding the cause of Cluster headache center around the disruption of the normal circadian rhythm in the brain.  This helps restore the normal circadian rhythm.   HEADACHE DIET: Foods and beverages which may trigger migraine Note that only 20% of headache patients are food sensitive. You will know if you are food sensitive if you get a headache consistently 20 minutes to 2 hours after eating a certain food. Only cut out a food if it causes headaches, otherwise you might remove foods you enjoy! What matters  most for diet is to eat a well balanced healthy diet full of vegetables and low fat protein, and to not miss meals.   Chocolate, other sweets ALL cheeses except cottage and cream cheese Dairy products, yogurt, sour cream, ice cream Liver Meat extracts (Bovril, Marmite, meat tenderizers) Meats or fish which have undergone aging, fermenting, pickling or smoking. These include: Hotdogs,salami,Lox,sausage, mortadellas,smoked salmon, pepperoni, Pickled herring Pods of broad bean (English beans, Chinese pea pods, [place] (fava) beans, lima and navy beans Ripe avocado, ripe banana Yeast extracts or active yeast preparations such as Brewer's or Fleishman's (commercial bakes goods are permitted) Tomato based foods, pizza (lasagna, etc.)   MSG (monosodium glutamate) is disguised as many things; look for these common aliases: Monopotassium glutamate Autolysed yeast Hydrolysed protein Sodium caseinate "flavorings" "all natural preservatives Nutrasweet   Avoid all other foods that convincingly provoke headaches.   Resources: The Dizzy Bluford Aid Your Headache Diet, migrainestrong.com  https://zamora-andrews.com/   Caffeine and Migraine For patients that have migraine, caffeine intake more than 3 days per week can lead to dependency and increased migraine frequency. I would recommend cutting back on your caffeine intake as best you can. The recommended amount of caffeine is 200-300 mg daily, although migraine patients may experience dependency at even lower  doses. While you may notice an increase in headache temporarily, cutting back will be helpful for headaches in the long run. For more information on caffeine and migraine, visit: https://americanmigrainefoundation.org/resource-library/caffeine-and-migraine/   Headache Prevention Strategies:   1. Maintain a headache diary; learn to identify and avoid triggers.  - This can be a simple note where you  log when you had a headache, associated symptoms, and medications used - There are several smartphone apps developed to help track migraines: Migraine Buddy, Migraine Monitor, Curelator N1-Headache App   Common triggers include: Emotional triggers: Emotional/Upset family or friends Emotional/Upset occupation Business reversal/success Anticipation anxiety Crisis-serious Post-crisis periodNew job/position   Physical triggers: Vacation Day Weekend Strenuous Exercise High Altitude Location New Move Menstrual Day Physical Illness Oversleep/Not enough sleep Weather changes Light: Photophobia or light sesnitivity treatment involves a balance between desensitization and reduction in overly strong input. Use dark polarized glasses outside, but not inside. Avoid bright or fluorescent light, but do not dim environment to the point that going into a normally lit room hurts. Consider FL-41 tint lenses, which reduce the most irritating wavelengths without blocking too much light.  These can be obtained at axonoptics.com or theraspecs.com Foods: see list above.   2. Limit use of acute treatments (over-the-counter medications, triptans, etc.) to no more than 2 days per week or 10 days per month to prevent medication overuse headache (rebound headache).     3. Follow a regular schedule (including weekends and holidays): Don't skip meals. Eat a balanced diet. 8 hours of sleep nightly. Minimize stress. Exercise 30 minutes per day. Being overweight is associated with a 5 times increased risk of chronic migraine. Keep well hydrated and drink 6-8 glasses of water  per day.   4. Initiate non-pharmacologic measures at the earliest onset of your headache. Rest and quiet environment. Relax and reduce stress. Breathe2Relax is a free app that can instruct you on    some simple relaxtion and breathing techniques. Http://Dawnbuse.com is a    free website that provides teaching videos on relaxation.  Also, there  are  many apps that   can be downloaded for "mindful" relaxation.  An app called YOGA NIDRA will help walk you through mindfulness. Another app called Calm can be downloaded to give you a structured mindfulness guide with daily reminders and skill development. Headspace for guided meditation Mindfulness Based Stress Reduction Online Course: www.palousemindfulness.com Cold compresses.   5. Don't wait!! Take the maximum allowable dosage of prescribed medication at the first sign of migraine.   6. Compliance:  Take prescribed medication regularly as directed and at the first sign of a migraine.   7. Communicate:  Call your physician when problems arise, especially if your headaches change, increase in frequency/severity, or become associated with neurological symptoms (weakness, numbness, slurred speech, etc.). Proceed to emergency room if you experience new or worsening symptoms or symptoms do not resolve, if you have new neurologic symptoms or if headache is severe, or for any concerning symptom.   8. Headache/pain management therapies: Consider various complementary methods, including medication, behavioral therapy, psychological counselling, biofeedback, massage therapy, acupuncture, dry needling, and other modalities.  Such measures may reduce the need for medications. Counseling for pain management, where patients learn to function and ignore/minimize their pain, seems to work very well.   9. Recommend changing family's attention and focus away from patient's headaches. Instead, emphasize daily activities. If first question of day is 'How are your headaches/Do you have a headache today?', then patient will constantly think about  headaches, thus making them worse. Goal is to re-direct attention away from headaches, toward daily activities and other distractions.   10. Helpful Websites: www.AmericanHeadacheSociety.org PatentHood.ch www.headaches.org TightMarket.nl www.achenet.org

## 2024-07-16 ENCOUNTER — Encounter: Payer: Self-pay | Admitting: Family Medicine

## 2024-07-16 ENCOUNTER — Ambulatory Visit (INDEPENDENT_AMBULATORY_CARE_PROVIDER_SITE_OTHER): Payer: Medicare Other | Admitting: Family Medicine

## 2024-07-16 VITALS — BP 126/88 | HR 77 | Ht 63.0 in | Wt 177.5 lb

## 2024-07-16 DIAGNOSIS — G4733 Obstructive sleep apnea (adult) (pediatric): Secondary | ICD-10-CM

## 2024-07-16 DIAGNOSIS — G43009 Migraine without aura, not intractable, without status migrainosus: Secondary | ICD-10-CM | POA: Diagnosis not present

## 2024-07-16 DIAGNOSIS — G2581 Restless legs syndrome: Secondary | ICD-10-CM

## 2024-07-16 DIAGNOSIS — R682 Dry mouth, unspecified: Secondary | ICD-10-CM

## 2024-07-16 MED ORDER — AMITRIPTYLINE HCL 25 MG PO TABS: 25.0000 mg | ORAL_TABLET | Freq: Every day | ORAL | 3 refills | Status: DC

## 2024-07-22 ENCOUNTER — Other Ambulatory Visit: Payer: Self-pay | Admitting: Family Medicine

## 2024-07-22 MED ORDER — EMPAGLIFLOZIN 10 MG PO TABS: 10.0000 mg | ORAL_TABLET | Freq: Every day | ORAL | 3 refills | Status: AC

## 2024-07-22 MED ORDER — MONTELUKAST SODIUM 10 MG PO TABS: 10.0000 mg | ORAL_TABLET | Freq: Every day | ORAL | 1 refills | Status: DC

## 2024-07-22 NOTE — Telephone Encounter (Signed)
 Copied from CRM #[id number]. Topic: Clinical - Medication Refill >> Jul 22, 2024 10:14 AM Aleatha C wrote: Medication: 90 day empagliflozin  (JARDIANCE ) 10 MG TABS tablet up to 3 refills and montelukast  (SINGULAIR ) 10 MG tablet  Has the patient contacted their pharmacy? Yes (Agent: If no, request that the patient contact the pharmacy for the refill. If patient does not wish to contact the pharmacy document the reason why and proceed with request.) (Agent: If yes, when and what did the pharmacy advise?)  This is the patient's preferred pharmacy:    [facility] - [place], [place] - [address] [place] [address] Phone: [phone] Fax: [phone]  Is this the correct pharmacy for this prescription? Yes If no, delete pharmacy and type the correct one.   Has the prescription been filled recently? No  Is the patient out of the medication? No 14 day suppl  Has the patient been seen for an appointment in the last year OR does the patient have an upcoming appointment? Yes  Can we respond through MyChart? No  Agent: Please be advised that Rx refills may take up to 3 business days. We ask that you follow-up with your pharmacy.

## 2024-07-23 ENCOUNTER — Other Ambulatory Visit: Payer: Self-pay | Admitting: *Deleted

## 2024-07-23 DIAGNOSIS — R0981 Nasal congestion: Secondary | ICD-10-CM | POA: Diagnosis not present

## 2024-07-23 MED ORDER — AMITRIPTYLINE HCL 25 MG PO TABS: 25.0000 mg | ORAL_TABLET | Freq: Every day | ORAL | 3 refills | Status: DC

## 2024-07-23 NOTE — Telephone Encounter (Signed)
 [facility] Rx was sent to local pharmacy for 90 day supply on 07/16/24 with refills.

## 2024-08-02 DIAGNOSIS — R070 Pain in throat: Secondary | ICD-10-CM | POA: Diagnosis not present

## 2024-08-02 DIAGNOSIS — R0981 Nasal congestion: Secondary | ICD-10-CM | POA: Diagnosis not present

## 2024-08-02 DIAGNOSIS — R112 Nausea with vomiting, unspecified: Secondary | ICD-10-CM | POA: Diagnosis not present

## 2024-08-14 ENCOUNTER — Ambulatory Visit: Admitting: Family Medicine

## 2024-08-18 ENCOUNTER — Other Ambulatory Visit: Payer: Self-pay | Admitting: Family Medicine

## 2024-08-25 ENCOUNTER — Other Ambulatory Visit (HOSPITAL_COMMUNITY)
Admission: RE | Admit: 2024-08-25 | Discharge: 2024-08-25 | Disposition: A | Discharge status: Home / Self Care | Source: Ambulatory Visit | Attending: Family Medicine | Admitting: Family Medicine

## 2024-08-25 ENCOUNTER — Encounter: Payer: Self-pay | Admitting: Family Medicine

## 2024-08-25 ENCOUNTER — Ambulatory Visit: Admitting: Family Medicine

## 2024-08-25 VITALS — BP 162/94 | HR 86 | Temp 97.2°F | Ht 63.0 in | Wt 182.6 lb

## 2024-08-25 DIAGNOSIS — N9489 Other specified conditions associated with female genital organs and menstrual cycle: Secondary | ICD-10-CM | POA: Diagnosis not present

## 2024-08-25 DIAGNOSIS — E1142 Type 2 diabetes mellitus with diabetic polyneuropathy: Secondary | ICD-10-CM | POA: Diagnosis not present

## 2024-08-25 DIAGNOSIS — K7581 Nonalcoholic steatohepatitis (NASH): Secondary | ICD-10-CM

## 2024-08-25 DIAGNOSIS — R102 Pelvic and perineal pain unspecified side: Secondary | ICD-10-CM | POA: Insufficient documentation

## 2024-08-25 DIAGNOSIS — R7989 Other specified abnormal findings of blood chemistry: Secondary | ICD-10-CM | POA: Diagnosis not present

## 2024-08-25 DIAGNOSIS — Z7984 Long term (current) use of oral hypoglycemic drugs: Secondary | ICD-10-CM | POA: Diagnosis not present

## 2024-08-25 DIAGNOSIS — I10 Essential (primary) hypertension: Secondary | ICD-10-CM | POA: Diagnosis not present

## 2024-08-25 DIAGNOSIS — E119 Type 2 diabetes mellitus without complications: Secondary | ICD-10-CM

## 2024-08-25 LAB — POCT GLYCOSYLATED HEMOGLOBIN (HGB A1C): Hemoglobin A1C: 5.9 % — AB (ref 4.0–5.6)

## 2024-08-25 MED ORDER — FLUCONAZOLE 150 MG PO TABS: ORAL_TABLET | ORAL | 0 refills | Status: DC

## 2024-08-25 MED ORDER — AMLODIPINE BESY-BENAZEPRIL HCL 5-20 MG PO CAPS: 1.0000 | ORAL_CAPSULE | Freq: Every day | ORAL | 3 refills | Status: DC

## 2024-08-25 NOTE — Patient Instructions (Signed)
 Please return in 6 months for your annual complete physical; please come fasting.  For follow up on chronic medical conditions   If you have any questions or concerns, please don't hesitate to send me a message via MyChart or call the office at [phone]. Thank you for visiting with us  today! It's our pleasure caring for you.

## 2024-08-25 NOTE — Progress Notes (Signed)
 Subjective  CC:  Chief Complaint  Patient presents with   New onset type 2 diabetes mellitus     Follow up   Foot Pain    Pt c/o bilateral foot pain and burning sensations off and on, present for a couple of months.    Vaginal burning    Pt c/o vaginal burning, present for 1 week. Has tried diflucan  in the past, which does help sx.     HPI: Margaret White is a 73 y.o. female who presents to the office today for follow up of diabetes and problems listed above in the chief complaint.  Discussed the use of AI scribe software for clinical note transcription with the patient, who gave verbal consent to proceed.  History of Present Illness Margaret White is a 73 year old female with diabetes who presents with numbness and tingling in her feet.  Peripheral neuropathy symptoms - Numbness, tingling, burning, and intermittent severe pain in both feet for at least one year - Describes the sensation as 'catacombs' - Symptoms are sporadic and not present daily - Reluctant to bend toes due to discomfort - Did not previously disclose symptoms due to concern she would be attributed to diabetes  Restless legs syndrome and neuropathic pain management - Currently taking gabapentin  300 mg three times daily, initially prescribed for restless legs syndrome - Gabapentin  provides relief for restless legs but unclear effect on burning pain in feet - Also taking vitamin B and medications for iron  and blood health  Diabetes mellitus management - Diabetes managed with medication/ started jardiacne last visit and tolerating well. Does c/o vaginal burning today w/o discharge. A1c was up to 7. Eating is challenging due to home stressors - + microalbunuria not on Ace yet.  - Hemoglobin A1c today is 5.9  HTN: better control but still above goal on amlodipine  10. No cp or sob or edema.   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 New onset type 2 diabetes mellitus (HCC)   2. Vaginal burning   3. Diabetes mellitus treated with oral medication (HCC)   4. Essential hypertension   5. Elevated LFTs   6. NASH (nonalcoholic steatohepatitis)      Plan  Assessment and Plan Assessment & Plan Type 2 diabetes mellitus managed with jardiance  Well-controlled with an A1c of 5.9%. Stress from husband's health issues may impact diabetes management, but current medication regimen is effective. - Continue current diabetes medication regimen. Work on diet.   Essential hypertension Blood pressure has been elevated in recent visits, possibly due to stress and dietary habits. Current management includes lifestyle modifications and medication. - Prescribed combination pill of amlodipine  and benazepril  for blood pressure control. - Advised low salt diet and weight loss of 3-6 pounds. - Encouraged stress control measures.  Peripheral neuropathy, feet Intermittent numbness, tingling, burning, and pain in feet for over a year. Symptoms are sporadic and not daily. Gabapentin  is used for restless legs and may help with neuropathy symptoms. Vitamin B complex may be beneficial. - Continue  gabapentin  300 mg three times a day. - Consider increasing gabapentin  dose if symptoms worsen. - Recommended vitamin B complex supplementation.  Vaginal sxs: at risk for yeast vag. Test vag swab and treat with diflucan .   NASH: elevated lfts on last labs w/o sxs. Will monitor     Follow up: 6 mo for cpe and recheck Orders Placed This Encounter  Procedures   POCT glycosylated hemoglobin (Hb A1C)   Meds ordered this encounter  Medications   fluconazole  (DIFLUCAN ) 150 MG tablet    Sig: Take one tablet today; may repeat in 3 days if symptoms persist    Dispense:  2 tablet    Refill:  0   amLODipine -benazepril (LOTREL) 5-20 MG capsule    Sig: Take 1 capsule by mouth daily.    Dispense:  90 capsule    Refill:  3      Immunization History  Administered Date(s) Administered   Fluad Quad(high Dose 65+) 06/24/2020   Influenza,inj,Quad PF,6+ Mos 08/06/2017, 07/31/2018   Influenza-Unspecified 07/23/2019, 08/07/2021   Pneumococcal Conjugate-13 03/24/2018   Pneumococcal Polysaccharide-23 08/19/2019   Tdap 04/25/2018   Zoster Recombinant(Shingrix) 08/07/2021, 08/30/2021    Diabetes Related Lab Review: Lab Results  Component Value Date   HGBA1C 5.9 (A) 08/25/2024   HGBA1C 7.4 (H) 05/14/2024   HGBA1C 6.6 (H) 01/29/2024    Lab Results  Component Value Date   MICROALBUR 0.7 06/09/2024   Lab Results  Component Value Date   CREATININE 0.96 06/09/2024   BUN 16 06/09/2024   NA 141 06/09/2024   K 3.9 06/09/2024   CL 105 06/09/2024   CO2 20 06/09/2024   Lab Results  Component Value Date   CHOL 174 06/09/2024   CHOL 155 07/23/2023   CHOL 196 03/05/2023   Lab Results  Component Value Date   HDL 62.30 06/09/2024   HDL 60 07/23/2023   HDL 69.50 03/05/2023   Lab Results  Component Value Date   LDLCALC 80 06/09/2024   LDLCALC 75 07/23/2023   LDLCALC 103 (H) 08/28/2022   Lab Results  Component Value Date   TRIG 159.0 (H) 06/09/2024   TRIG 115 07/23/2023   TRIG 220.0  (H) 03/05/2023   Lab Results  Component Value Date   CHOLHDL 3 06/09/2024   CHOLHDL 2.6 07/23/2023   CHOLHDL 3 03/05/2023   Lab Results  Component Value Date   LDLDIRECT 97.0 03/05/2023   LDLDIRECT 113.0 02/27/2022   LDLDIRECT 111.0 02/09/2021   The 10-year ASCVD risk score (Arnett DK, et al., 2019) is: 35.8%   Values used to calculate the score:     Age: 3 years     Clincally relevant sex: Female     Is Non-Hispanic African American: No     Diabetic: Yes     Tobacco smoker: No     Systolic Blood Pressure: 162 mmHg     Is BP treated: Yes     HDL Cholesterol: 62.3 mg/dL     Total Cholesterol: 174 mg/dL I have reviewed the PMH, Fam and Soc history. Patient Active Problem List   Diagnosis Date Noted   New onset type  2 diabetes mellitus (HCC) 06/09/2024    Priority: High    Avoid metformin  due to active IBS; trial of [id number]-2i 06/2024    Tubular adenoma of colon 08/19/2019    Priority: High    Tubular adenoma 2017; f/u colonoscopy 08/2021: tubular adenoma and sessile polyps; repeat 5 years. Dr. Luke Brass, [facility]    DOE (dyspnea on exertion) 05/14/2018    Priority: High    Ongoing for years; eval by pulm: chext CT, PFTs and possible asthma treated with advair. + allergies.  Eval by cards: normal Echo and stress test 2023.  Neg lymes eval, neg d-dimer     Statin intolerance 03/25/2018    Priority: High   GERD (gastroesophageal reflux disease) 10/14/2017    Priority: High   NASH (nonalcoholic steatohepatitis) 10/14/2017    Priority: High    Advanced hepatic steatosis by ultrasound; followed by Dr. Brass    OSA (obstructive sleep apnea) 10/14/2017    Priority: High    Sleep study: 03/2023 Dr. Harriett: We treated this sleep study like a SPLIT protocol , one part without dental device and one part with dental device: the dental device did not have much influence on the patient's apnea , and she has ongoing hypoxia , poor sleep continuity and  PLMs.  PLMs were not  related to REM sleep, and therefor this patient;s condition cannot be identified as a REM behavior disorder.  My goal is effective apnea treatment and reduction of arousals and PLMs. Lets try a CPAP  I have suggested 2 medications we can try, please let me know if the patient will take the offer. ( Elavil /Flexaril)     Fibromyalgia 10/14/2017    Priority: High   Obesity (BMI 30-39.9) 10/14/2017    Priority: High   Hyperlipidemia associated with type 2 diabetes mellitus (HCC) 10/14/2017    Priority: High    Tolerates pravachol  10 with zetia  10    Essential hypertension 06/26/2017    Priority: High   Osteopenia 05/21/2023    Priority: Medium     DEXA 03/2023 lowest t = - 1.5 FN; recheck 2 years    Incontinence without sensory awareness 05/08/2018    Priority: Medium     Followed by [facility]     Mixed incontinence 05/08/2018    Priority: Medium     Followed by [facility]     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 Shallow disc bulging without significant neuroforaminal stenosis.    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    Priority: Low   Allergic rhinitis 10/14/2017    Priority: Low   History of cluster headache 10/14/2017    Priority: Low   Chronic maxillary sinusitis 10/14/2017    Priority: Low   Vitamin B12 deficiency 10/14/2017     Priority:  Low   Vitamin D  deficiency 10/14/2017    Priority: Low   Female cystocele 10/14/2017    Priority: Low   Primary osteoarthritis of both hands 07/10/2017    Priority: Low    Last Assessment & Plan:  Try mobic  Stay hydrated  Monitor renal panel    Hypersomnia, periodic 01/02/2023   Hypnapompic hallucinations 01/02/2023   Sleep paralysis, recurrent isolated 01/02/2023   Nightmares 01/02/2023   Episodic cluster headache, not intractable 01/02/2023   Heart murmur, systolic 08/27/2022   Family history of abdominal aortic aneurysm (AAA) 07/03/2022    brother    Periodic limb movement sleep disorder 08/15/2021    Social History: Patient  reports that she has never smoked. She has never used smokeless tobacco. She reports that she does not drink alcohol and does not use drugs.  Review of Systems: Ophthalmic: negative for eye pain, loss of vision or double vision Cardiovascular: negative for chest pain Respiratory: negative for SOB or persistent cough Gastrointestinal: negative for abdominal pain Genitourinary: negative for dysuria or gross hematuria MSK: negative for foot lesions Neurologic: negative for weakness or gait disturbance  Objective  Vitals: BP (!) 162/94 (BP Location: Left Arm, Patient Position: Sitting, Cuff Size: Normal)   Pulse 86   Temp (!) 97.2 F (36.2 C) (Temporal)   Ht 5' 3 (1.6 m)   Wt 182 lb 9.6 oz (82.8 kg)   SpO2 94%   BMI 32.35 kg/m  General: well appearing, no acute distress  Psych:  Alert and oriented, normal mood and affect HEENT:  Normocephalic, atraumatic, moist mucous membranes, supple neck  Cardiovascular:  Nl S1 and S2, RRR without murmur, gallop or rub. no edema Respiratory:  Good breath sounds bilaterally, CTAB with normal effort, no rales Diabetic education: ongoing education regarding chronic disease management for diabetes was given today. We continue to reinforce the ABC's of diabetic management: A1c (<7 or 8 dependent upon  patient), tight blood pressure control, and cholesterol management with goal LDL < 100 minimally. We discuss diet strategies, exercise recommendations, medication options and possible side effects. At each visit, we review recommended immunizations and preventive care recommendations for diabetics and stress that good diabetic control can prevent other problems. See below for this patient's data. Commons side effects, risks, benefits, and alternatives for medications and treatment plan prescribed today were discussed, and the patient expressed understanding of the given instructions. Patient is instructed to call or message via MyChart if he/she has any questions or concerns regarding our treatment plan. No barriers to understanding were identified. We discussed Red Flag symptoms and signs in detail. Patient expressed understanding regarding what to do in case of urgent or emergency type symptoms.  Medication list was reconciled, printed and provided to the patient in AVS. Patient instructions and summary information was reviewed with the patient as documented in the AVS. This note was prepared with assistance of Dragon voice recognition software. Occasional wrong-word or sound-a-like substitutions may have occurred due to the inherent limitations of voice recognition software

## 2024-08-26 ENCOUNTER — Ambulatory Visit: Payer: Self-pay | Admitting: Family Medicine

## 2024-08-26 LAB — CERVICOVAGINAL ANCILLARY ONLY
Bacterial Vaginitis (gardnerella): NEGATIVE
Candida Glabrata: NEGATIVE
Candida Vaginitis: POSITIVE — AB
Comment: NEGATIVE
Comment: NEGATIVE
Comment: NEGATIVE

## 2024-08-26 NOTE — Progress Notes (Signed)
 See mychart note Dear Ms. Gammon, Your vaginal swab does confirm a yeast infection. Please take the diflucan  ordered yesterday to resolve this. The Jardiance  will make yeast infections more likely; to prevent, please ensure good cleaning after voiding. Sometimes the use of a wet wipe can be helpful. Take care. Sincerely, Dr. Jodie

## 2024-09-01 ENCOUNTER — Ambulatory Visit: Admitting: Family Medicine

## 2024-09-13 ENCOUNTER — Other Ambulatory Visit: Payer: Self-pay | Admitting: Family

## 2024-09-13 DIAGNOSIS — K219 Gastro-esophageal reflux disease without esophagitis: Secondary | ICD-10-CM

## 2024-09-14 ENCOUNTER — Other Ambulatory Visit: Payer: Self-pay | Admitting: Family Medicine

## 2024-09-14 DIAGNOSIS — Z1231 Encounter for screening mammogram for malignant neoplasm of breast: Secondary | ICD-10-CM

## 2024-09-28 ENCOUNTER — Ambulatory Visit

## 2024-09-28 DIAGNOSIS — Z1231 Encounter for screening mammogram for malignant neoplasm of breast: Secondary | ICD-10-CM

## 2024-10-04 ENCOUNTER — Other Ambulatory Visit: Payer: Self-pay | Admitting: Family Medicine

## 2024-10-05 ENCOUNTER — Other Ambulatory Visit: Payer: Self-pay | Admitting: Medical Genetics

## 2024-10-15 ENCOUNTER — Other Ambulatory Visit: Payer: Self-pay | Admitting: Family Medicine

## 2024-10-15 DIAGNOSIS — M797 Fibromyalgia: Secondary | ICD-10-CM

## 2024-10-15 NOTE — Telephone Encounter (Signed)
 08/25/2024 LOV  09/19/2023 fill date  270/3 refills

## 2024-10-19 ENCOUNTER — Ambulatory Visit: Payer: Self-pay

## 2024-10-19 NOTE — Telephone Encounter (Signed)
 FYI Only or Action Required?: Action required by provider: request for appointment.  Patient was last seen in primary care on 08/25/2024 by Jodie Lavern CROME, MD.  Called Nurse Triage reporting Back Pain.  Symptoms began several weeks ago.  Interventions attempted: Rest, hydration, or home remedies.  Symptoms are: unchanged.Low back pain, worse with activity. Has warm sensation to right buttock at times.  Triage Disposition: See PCP When Office is Open (Within 3 Days)  Patient/caregiver understands and will follow disposition?: Yes      Reason for Disposition  [1] MODERATE back pain (e.g., interferes with normal activities) AND [2] present > 3 days  Answer Assessment - Initial Assessment Questions 1. ONSET: When did the pain begin? (e.g., minutes, hours, days)     2 weeks 2. LOCATION: Where does it hurt? (upper, mid or lower back)     lower 3. SEVERITY: How bad is the pain?  (e.g., Scale 1-10; mild, moderate, or severe)     After standing - 5-6  work  - 10 4. PATTERN: Is the pain constant? (e.g., yes, no; constant, intermittent)      constant 5. RADIATION: Does the pain shoot into your legs or somewhere else?     Warm sensation to right buttock 6. CAUSE:  What do you think is causing the back pain?      unsure 7. BACK OVERUSE:  Any recent lifting of heavy objects, strenuous work or exercise?     no 8. MEDICINES: What have you taken so far for the pain? (e.g., nothing, acetaminophen , NSAIDS)     N/a 9. NEUROLOGIC SYMPTOMS: Do you have any weakness, numbness, or problems with bowel/bladder control?     no 10. OTHER SYMPTOMS: Do you have any other symptoms? (e.g., fever, abdomen pain, burning with urination, blood in urine)       Warm sensation to right buttack 11. PREGNANCY: Is there any chance you are pregnant? When was your last menstrual period?       no  Protocols used: Back Pain-A-AH

## 2024-10-19 NOTE — Telephone Encounter (Signed)
 noted

## 2024-10-21 ENCOUNTER — Ambulatory Visit

## 2024-10-21 ENCOUNTER — Encounter: Payer: Self-pay | Admitting: Family Medicine

## 2024-10-21 ENCOUNTER — Ambulatory Visit: Admitting: Family Medicine

## 2024-10-21 VITALS — BP 130/89 | HR 91 | Temp 98.2°F | Ht 63.0 in | Wt 182.6 lb

## 2024-10-21 DIAGNOSIS — M545 Low back pain, unspecified: Secondary | ICD-10-CM | POA: Diagnosis not present

## 2024-10-21 DIAGNOSIS — N9489 Other specified conditions associated with female genital organs and menstrual cycle: Secondary | ICD-10-CM | POA: Diagnosis not present

## 2024-10-21 DIAGNOSIS — G8929 Other chronic pain: Secondary | ICD-10-CM

## 2024-10-21 DIAGNOSIS — L304 Erythema intertrigo: Secondary | ICD-10-CM

## 2024-10-21 MED ORDER — FLUCONAZOLE 150 MG PO TABS: ORAL_TABLET | ORAL | 0 refills | Status: AC

## 2024-10-21 MED ORDER — NYSTATIN 100000 UNIT/GM EX POWD: 1.0000 | Freq: Three times a day (TID) | CUTANEOUS | 0 refills | Status: AC

## 2024-10-21 MED ORDER — TRAMADOL HCL 50 MG PO TABS: 50.0000 mg | ORAL_TABLET | Freq: Four times a day (QID) | ORAL | 0 refills | Status: AC | PRN

## 2024-10-21 NOTE — Progress Notes (Signed)
 "  Subjective  CC:  Chief Complaint  Patient presents with   Back Pain    Pt stated that she has been having lower back pain for some months now but it has gotten worse and with a burning sensation and interrupting her activities   Rash    Rash under both breast that came yesterday and it has been oozing     HPI: Margaret White is a 74 y.o. female who presents to the office today to address the problems listed above in the chief complaint. Chronic low back pain but worse over the last several months.  Low back pain that is worse after she is up doing things like sweeping the floor or doing dishes.  He has to sit down.  Pain recently improved for unclear reasons she does have persistent burning in the low back to mid back.  She has no radicular symptoms.  No lower extremity weakness.  Pain has been ongoing for years.  Last imaged was many years ago.  She had been to a back doctor and a pain management specialist in the past.  She has had epidural steroid injections.  She thinks it is related to arthritis.  She does have some back stiffness.  No recent injuries.  She does have fibromyalgia.  This pain feels different. Complains of redness irritation and drainage underneath both breast that started yesterday. Diabetic on Farxiga with intermittent yeast symptoms.  Would like Diflucan .  She does use wet wipes which has decreased frequency of symptoms.  No urinary symptoms Assessment  1. Chronic midline low back pain without sciatica   2. Intertrigo   3. Vaginal burning      Plan  Episodic chronic mid low back pain: Musculoskeletal most likely.  Check x-rays.  Tramadol  if needed.  Recommend considering physical therapy again.  Will increase gabapentin  to 600 mg nightly.  She is on 300 3 times daily for fibromyalgia.  She tolerates this well.  This could also help her musculoskeletal pain.  Possible spinal stenosis versus osteoarthritis Intertrigo: Nystatin  powder Diflucan  as needed for likely  yeast vaginitis related to diabetes and Farxiga  Follow up: As scheduled 02/22/2025  Orders Placed This Encounter  Procedures   DG Lumbar Spine Complete   Meds ordered this encounter  Medications   fluconazole  (DIFLUCAN ) 150 MG tablet    Sig: Take one tablet today; may repeat in 3 days if symptoms persist    Dispense:  2 tablet    Refill:  0   nystatin  (MYCOSTATIN /NYSTOP ) powder    Sig: Apply 1 Application topically 3 (three) times daily.    Dispense:  60 g    Refill:  0   traMADol  (ULTRAM ) 50 MG tablet    Sig: Take 1 tablet (50 mg total) by mouth every 6 (six) hours as needed for moderate pain (pain score 4-6).    Dispense:  30 tablet    Refill:  0      I reviewed the patients updated PMH, FH, and SocHx.    Patient Active Problem List   Diagnosis Date Noted   New onset type 2 diabetes mellitus (HCC) 06/09/2024    Priority: High   Tubular adenoma of colon 08/19/2019    Priority: High   DOE (dyspnea on exertion) 05/14/2018    Priority: High   Statin intolerance 03/25/2018    Priority: High   GERD (gastroesophageal reflux disease) 10/14/2017    Priority: High   NASH (nonalcoholic steatohepatitis) 10/14/2017    Priority:  High   OSA (obstructive sleep apnea) 10/14/2017    Priority: High   Fibromyalgia 10/14/2017    Priority: High   Obesity (BMI 30-39.9) 10/14/2017    Priority: High   Hyperlipidemia associated with type 2 diabetes mellitus (HCC) 10/14/2017    Priority: High   Essential hypertension 06/26/2017    Priority: High   Osteopenia 05/21/2023    Priority: Medium    Incontinence without sensory awareness 05/08/2018    Priority: Medium    Mixed incontinence 05/08/2018    Priority: Medium    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    Priority: Medium    Bilateral primary osteoarthritis of hip 11/05/2017    Priority: Medium    Spondylosis of lumbar region without myelopathy or radiculopathy 11/04/2017    Priority: Medium    IBS (irritable  bowel syndrome) 10/14/2017    Priority: Medium    Major depression, recurrent, chronic 06/26/2017    Priority: Medium    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    Priority: Low   Allergic rhinitis 10/14/2017    Priority: Low   History of cluster headache 10/14/2017    Priority: Low   Chronic maxillary sinusitis 10/14/2017    Priority: Low   Vitamin B12 deficiency 10/14/2017    Priority: Low   Vitamin D  deficiency 10/14/2017    Priority: Low   Female cystocele 10/14/2017    Priority: Low   Primary osteoarthritis of both hands 07/10/2017    Priority: Low   Hypersomnia, periodic 01/02/2023   Hypnapompic hallucinations 01/02/2023   Sleep paralysis, recurrent isolated 01/02/2023   Nightmares 01/02/2023   Episodic cluster headache, not intractable 01/02/2023   Heart murmur, systolic 08/27/2022   Family history of abdominal aortic aneurysm (AAA) 07/03/2022   Periodic limb movement sleep disorder 08/15/2021   Active Medications[1]  Allergies: Patient is allergic to codeine  and sulfa antibiotics. Family History: Patient family history includes Alcoholism in her paternal grandfather; CAD in her maternal grandfather; Cancer in her paternal grandmother; Congestive Heart Failure in her mother; Emphysema in her maternal grandmother and sister; Heart disease in her brother, father, maternal grandfather, and maternal grandmother; Hypertension in her mother; Multiple myeloma in her mother; Other in her brother; Prostate cancer in her father. Social History:  Patient  reports that she has never smoked. She has never used smokeless tobacco. She reports that she does not drink alcohol and does not use drugs.  Review of Systems: Constitutional: Negative for fever malaise or anorexia Cardiovascular: negative for chest pain Respiratory: negative for SOB or persistent cough Gastrointestinal: negative for abdominal pain  Objective  Vitals: BP 130/89   Pulse 91   Temp 98.2 F (36.8 C)   Ht 5' 3 (1.6 m)    Wt 182 lb 9.6 oz (82.8 kg)   SpO2 93%   BMI 32.35 kg/m  General: no acute distress , A&Ox3 Classic intertrigo underneath bilateral breast Negative straight leg raise bilaterally normal gait nontender low back  Commons side effects, risks, benefits, and alternatives for medications and treatment plan prescribed today were discussed, and the patient expressed understanding of the given instructions. Patient is instructed to call or message via MyChart if he/she has any questions or concerns regarding our treatment plan. No barriers to understanding were identified. We discussed Red Flag symptoms and signs in detail. Patient expressed understanding regarding what to do in case of urgent or emergency type symptoms.  Medication list was reconciled, printed and provided to the patient in AVS. Patient instructions and  summary information was reviewed with the patient as documented in the AVS. This note was prepared with assistance of Dragon voice recognition software. Occasional wrong-word or sound-a-like substitutions may have occurred due to the inherent limitations of voice recognition software       [1]  Current Meds  Medication Sig   amitriptyline  (ELAVIL ) 25 MG tablet Take 1 tablet (25 mg total) by mouth at bedtime.   amLODipine -benazepril  (LOTREL) 5-20 MG capsule Take 1 capsule by mouth daily.   budesonide -formoterol  (SYMBICORT ) 160-4.5 MCG/ACT inhaler Inhale 2 puffs into the lungs 2 (two) times daily.   calcium carbonate (OS-CAL) 600 MG TABS tablet Take 600 mg by mouth daily with breakfast.   cyclobenzaprine  (FLEXERIL ) 5 MG tablet Take 1-2 tablets (5-10 mg total) by mouth 3 (three) times daily as needed (For headache. May cause drowsiness, ok to take 2 pills at bedtime.).   DULoxetine  (CYMBALTA ) 60 MG capsule TAKE 1 CAPSULE DAILY (REPLACING 30 MG PRESCRIPTION)   empagliflozin  (JARDIANCE ) 10 MG TABS tablet Take 1 tablet (10 mg total) by mouth daily.   esomeprazole  (NEXIUM ) 40 MG capsule  TAKE 1 CAPSULE (40 MG TOTAL) BY MOUTH 2 (TWO) TIMES DAILY BEFORE A MEAL.   ezetimibe  (ZETIA ) 10 MG tablet TAKE 1 TABLET DAILY   FLUoxetine  (PROZAC ) 10 MG tablet TAKE 1 TABLET DAILY   fluticasone  (FLONASE ) 50 MCG/ACT nasal spray USE 1 SPRAY IN EACH NOSTRIL DAILY AS NEEDED FOR ALLERGIES   gabapentin  (NEURONTIN ) 300 MG capsule TAKE 1 CAPSULE THREE TIMES A DAY   hydrOXYzine  (VISTARIL ) 25 MG capsule TAKE 1-2 CAPSULES (25-50 MG TOTAL) BY MOUTH EVERY 8 (EIGHT) HOURS AS NEEDED FOR ITCHING OR ANXIETY.   levocetirizine (XYZAL) 5 MG tablet Take 5 mg by mouth 2 (two) times daily.   montelukast  (SINGULAIR ) 10 MG tablet TAKE 1 TABLET BY MOUTH EVERYDAY AT BEDTIME   nystatin  (MYCOSTATIN /NYSTOP ) powder Apply 1 Application topically 3 (three) times daily.   Polyvinyl Alcohol-Povidone PF (REFRESH) 1.4-0.6 % SOLN Place 1 drop into both eyes daily as needed (Dry eye).   pravastatin  (PRAVACHOL ) 10 MG tablet TAKE 1 TABLET BY MOUTH EVERY DAY   traMADol  (ULTRAM ) 50 MG tablet Take 1 tablet (50 mg total) by mouth every 6 (six) hours as needed for moderate pain (pain score 4-6).   triamcinolone  cream (KENALOG ) 0.1 %    "

## 2024-10-29 ENCOUNTER — Other Ambulatory Visit: Payer: Self-pay

## 2024-10-29 MED ORDER — AMITRIPTYLINE HCL 25 MG PO TABS: 25.0000 mg | ORAL_TABLET | Freq: Every day | ORAL | 2 refills | Status: AC

## 2024-11-02 ENCOUNTER — Other Ambulatory Visit: Payer: Self-pay

## 2024-11-02 ENCOUNTER — Telehealth: Payer: Self-pay | Admitting: Family Medicine

## 2024-11-02 MED ORDER — AMLODIPINE BESY-BENAZEPRIL HCL 5-20 MG PO CAPS: 1.0000 | ORAL_CAPSULE | Freq: Every day | ORAL | 3 refills | Status: AC

## 2024-11-02 NOTE — Telephone Encounter (Signed)
 Prescription Request  11/02/2024  LOV: 10/21/2024  What is the name of the medication or equipment? amLODipine -benazepril  (LOTREL) 5-20 MG capsule ( 3 REFILLS, Quantity for 90 day supply)  Have you contacted your pharmacy to request a refill? Yes   Which pharmacy would you like this sent to?   [facility] - [place], [place] - [address] [place] [address] Phone: [phone] Fax: [phone]

## 2024-11-07 ENCOUNTER — Ambulatory Visit: Payer: Self-pay | Admitting: Family Medicine

## 2024-11-07 NOTE — Progress Notes (Signed)
 See mychart note Dear Ms. 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 This can be the entire cause of your back pain. We can consider physical therapy if needed and further workup if things worsen.  Sincerely, Dr. Jodie

## 2024-11-26 ENCOUNTER — Other Ambulatory Visit

## 2025-02-22 ENCOUNTER — Ambulatory Visit: Admitting: Family Medicine

## 2025-08-09 ENCOUNTER — Ambulatory Visit: Admitting: Family Medicine
